# Patient Record
Sex: Female | Born: 1935 | Race: White | Hispanic: No | Marital: Married | Smoking: Former smoker
Health system: Southern US, Community
[De-identification: ages and names within clinical notes are randomized; demographics above are authoritative.]

## PROBLEM LIST (undated history)

## (undated) DIAGNOSIS — C50912 Malignant neoplasm of unspecified site of left female breast: Secondary | ICD-10-CM

## (undated) DIAGNOSIS — E785 Hyperlipidemia, unspecified: Secondary | ICD-10-CM

## (undated) DIAGNOSIS — N6019 Diffuse cystic mastopathy of unspecified breast: Secondary | ICD-10-CM

## (undated) DIAGNOSIS — K5900 Constipation, unspecified: Secondary | ICD-10-CM

## (undated) DIAGNOSIS — G43109 Migraine with aura, not intractable, without status migrainosus: Secondary | ICD-10-CM

## (undated) DIAGNOSIS — Z803 Family history of malignant neoplasm of breast: Secondary | ICD-10-CM

## (undated) HISTORY — PX: TONSILLECTOMY: SUR1361

## (undated) HISTORY — DX: Family history of malignant neoplasm of breast: Z80.3

## (undated) HISTORY — DX: Hyperlipidemia, unspecified: E78.5

## (undated) HISTORY — PX: BREAST EXCISIONAL BIOPSY: SUR124

## (undated) HISTORY — PX: BREAST BIOPSY: SHX20

## (undated) HISTORY — DX: Constipation, unspecified: K59.00

---

## 1999-10-26 HISTORY — PX: BREAST LUMPECTOMY: SHX2

## 2007-09-29 ENCOUNTER — Encounter: Admission: RE | Admit: 2007-09-29 | Discharge: 2007-09-29 | Payer: Self-pay | Admitting: Internal Medicine

## 2008-01-01 ENCOUNTER — Encounter: Admission: RE | Admit: 2008-01-01 | Discharge: 2008-01-01 | Payer: Self-pay | Admitting: Internal Medicine

## 2008-11-27 ENCOUNTER — Encounter: Admission: RE | Admit: 2008-11-27 | Discharge: 2008-11-27 | Payer: Self-pay | Admitting: Family Medicine

## 2009-03-14 ENCOUNTER — Encounter: Admission: RE | Admit: 2009-03-14 | Discharge: 2009-03-14 | Payer: Self-pay | Admitting: Family Medicine

## 2009-07-24 ENCOUNTER — Encounter: Admission: RE | Admit: 2009-07-24 | Discharge: 2009-07-24 | Payer: Self-pay | Admitting: Family Medicine

## 2009-11-24 ENCOUNTER — Other Ambulatory Visit: Admission: RE | Admit: 2009-11-24 | Discharge: 2009-11-24 | Payer: Self-pay | Admitting: *Deleted

## 2009-12-03 ENCOUNTER — Encounter: Admission: RE | Admit: 2009-12-03 | Discharge: 2009-12-03 | Payer: Self-pay | Admitting: *Deleted

## 2010-03-02 ENCOUNTER — Encounter: Admission: RE | Admit: 2010-03-02 | Discharge: 2010-04-15 | Payer: Self-pay | Admitting: *Deleted

## 2010-11-14 ENCOUNTER — Other Ambulatory Visit: Payer: Self-pay | Admitting: Internal Medicine

## 2010-11-14 DIAGNOSIS — Z780 Asymptomatic menopausal state: Secondary | ICD-10-CM

## 2010-11-14 DIAGNOSIS — Z1231 Encounter for screening mammogram for malignant neoplasm of breast: Secondary | ICD-10-CM

## 2010-11-14 DIAGNOSIS — M8580 Other specified disorders of bone density and structure, unspecified site: Secondary | ICD-10-CM

## 2010-11-15 ENCOUNTER — Encounter (HOSPITAL_BASED_OUTPATIENT_CLINIC_OR_DEPARTMENT_OTHER): Payer: Self-pay | Admitting: Internal Medicine

## 2010-11-16 ENCOUNTER — Encounter: Payer: Self-pay | Admitting: Family Medicine

## 2010-12-04 ENCOUNTER — Ambulatory Visit
Admission: RE | Admit: 2010-12-04 | Discharge: 2010-12-04 | Disposition: A | Discharge status: Home / Self Care | Payer: MEDICARE | Source: Ambulatory Visit | Attending: Internal Medicine | Admitting: Internal Medicine

## 2010-12-04 DIAGNOSIS — Z1231 Encounter for screening mammogram for malignant neoplasm of breast: Secondary | ICD-10-CM

## 2010-12-07 ENCOUNTER — Other Ambulatory Visit: Payer: Self-pay | Admitting: Internal Medicine

## 2010-12-07 DIAGNOSIS — R928 Other abnormal and inconclusive findings on diagnostic imaging of breast: Secondary | ICD-10-CM

## 2010-12-16 ENCOUNTER — Ambulatory Visit
Admission: RE | Admit: 2010-12-16 | Discharge: 2010-12-16 | Disposition: A | Discharge status: Home / Self Care | Payer: MEDICARE | Source: Ambulatory Visit | Attending: Internal Medicine | Admitting: Internal Medicine

## 2010-12-16 DIAGNOSIS — R928 Other abnormal and inconclusive findings on diagnostic imaging of breast: Secondary | ICD-10-CM

## 2010-12-18 ENCOUNTER — Other Ambulatory Visit: Payer: Self-pay | Admitting: Internal Medicine

## 2010-12-18 DIAGNOSIS — Z09 Encounter for follow-up examination after completed treatment for conditions other than malignant neoplasm: Secondary | ICD-10-CM

## 2011-03-17 ENCOUNTER — Other Ambulatory Visit: Payer: Self-pay

## 2011-03-18 ENCOUNTER — Other Ambulatory Visit: Payer: Self-pay

## 2011-03-18 ENCOUNTER — Ambulatory Visit
Admission: RE | Admit: 2011-03-18 | Discharge: 2011-03-18 | Disposition: A | Discharge status: Home / Self Care | Payer: Medicare Other | Source: Ambulatory Visit | Attending: Internal Medicine | Admitting: Internal Medicine

## 2011-03-18 DIAGNOSIS — M8580 Other specified disorders of bone density and structure, unspecified site: Secondary | ICD-10-CM

## 2011-03-18 DIAGNOSIS — Z780 Asymptomatic menopausal state: Secondary | ICD-10-CM

## 2011-04-15 ENCOUNTER — Other Ambulatory Visit: Payer: Self-pay | Admitting: Family Medicine

## 2011-04-15 DIAGNOSIS — N6310 Unspecified lump in the right breast, unspecified quadrant: Secondary | ICD-10-CM

## 2011-04-20 ENCOUNTER — Ambulatory Visit
Admission: RE | Admit: 2011-04-20 | Discharge: 2011-04-20 | Disposition: A | Discharge status: Home / Self Care | Payer: Medicare Other | Source: Ambulatory Visit | Attending: Internal Medicine | Admitting: Internal Medicine

## 2011-04-20 ENCOUNTER — Other Ambulatory Visit: Payer: Self-pay | Admitting: Internal Medicine

## 2011-04-20 ENCOUNTER — Ambulatory Visit
Admission: RE | Admit: 2011-04-20 | Discharge: 2011-04-20 | Disposition: A | Discharge status: Home / Self Care | Payer: Medicare Other | Source: Ambulatory Visit | Attending: Family Medicine | Admitting: Family Medicine

## 2011-04-20 DIAGNOSIS — Z09 Encounter for follow-up examination after completed treatment for conditions other than malignant neoplasm: Secondary | ICD-10-CM

## 2011-04-20 DIAGNOSIS — N6310 Unspecified lump in the right breast, unspecified quadrant: Secondary | ICD-10-CM

## 2011-06-17 ENCOUNTER — Ambulatory Visit
Admission: RE | Admit: 2011-06-17 | Discharge: 2011-06-17 | Disposition: A | Discharge status: Home / Self Care | Payer: Medicare Other | Source: Ambulatory Visit | Attending: Internal Medicine | Admitting: Internal Medicine

## 2011-06-17 ENCOUNTER — Other Ambulatory Visit: Payer: Self-pay | Admitting: Family Medicine

## 2011-06-17 DIAGNOSIS — Z09 Encounter for follow-up examination after completed treatment for conditions other than malignant neoplasm: Secondary | ICD-10-CM

## 2011-11-17 ENCOUNTER — Other Ambulatory Visit: Payer: Self-pay | Admitting: Family Medicine

## 2011-11-17 DIAGNOSIS — N6009 Solitary cyst of unspecified breast: Secondary | ICD-10-CM

## 2011-12-08 ENCOUNTER — Ambulatory Visit
Admission: RE | Admit: 2011-12-08 | Discharge: 2011-12-08 | Disposition: A | Discharge status: Home / Self Care | Payer: PRIVATE HEALTH INSURANCE | Source: Ambulatory Visit | Attending: Family Medicine | Admitting: Family Medicine

## 2011-12-08 DIAGNOSIS — N6009 Solitary cyst of unspecified breast: Secondary | ICD-10-CM

## 2012-04-21 ENCOUNTER — Other Ambulatory Visit: Payer: Self-pay | Admitting: Family Medicine

## 2012-04-21 DIAGNOSIS — Z853 Personal history of malignant neoplasm of breast: Secondary | ICD-10-CM

## 2012-04-21 DIAGNOSIS — R922 Inconclusive mammogram: Secondary | ICD-10-CM

## 2012-04-21 DIAGNOSIS — R928 Other abnormal and inconclusive findings on diagnostic imaging of breast: Secondary | ICD-10-CM

## 2012-05-03 ENCOUNTER — Ambulatory Visit
Admission: RE | Admit: 2012-05-03 | Discharge: 2012-05-03 | Disposition: A | Discharge status: Home / Self Care | Payer: PRIVATE HEALTH INSURANCE | Source: Ambulatory Visit | Attending: Family Medicine | Admitting: Family Medicine

## 2012-05-03 ENCOUNTER — Inpatient Hospital Stay: Admission: RE | Admit: 2012-05-03 | Payer: PRIVATE HEALTH INSURANCE | Source: Ambulatory Visit

## 2012-05-03 DIAGNOSIS — R928 Other abnormal and inconclusive findings on diagnostic imaging of breast: Secondary | ICD-10-CM

## 2012-05-05 ENCOUNTER — Other Ambulatory Visit: Payer: PRIVATE HEALTH INSURANCE

## 2012-11-06 ENCOUNTER — Other Ambulatory Visit: Payer: Self-pay | Admitting: Family Medicine

## 2012-11-06 DIAGNOSIS — Z9889 Other specified postprocedural states: Secondary | ICD-10-CM

## 2012-11-06 DIAGNOSIS — Z1231 Encounter for screening mammogram for malignant neoplasm of breast: Secondary | ICD-10-CM

## 2013-05-04 ENCOUNTER — Ambulatory Visit
Admission: RE | Admit: 2013-05-04 | Discharge: 2013-05-04 | Disposition: A | Discharge status: Home / Self Care | Payer: PRIVATE HEALTH INSURANCE | Source: Ambulatory Visit | Attending: Family Medicine | Admitting: Family Medicine

## 2013-05-04 DIAGNOSIS — Z9889 Other specified postprocedural states: Secondary | ICD-10-CM

## 2013-05-04 DIAGNOSIS — Z1231 Encounter for screening mammogram for malignant neoplasm of breast: Secondary | ICD-10-CM

## 2014-03-27 ENCOUNTER — Other Ambulatory Visit: Payer: Self-pay | Admitting: Family Medicine

## 2014-03-27 ENCOUNTER — Other Ambulatory Visit: Payer: Self-pay

## 2014-03-27 DIAGNOSIS — Z1231 Encounter for screening mammogram for malignant neoplasm of breast: Secondary | ICD-10-CM

## 2014-03-27 DIAGNOSIS — M899 Disorder of bone, unspecified: Secondary | ICD-10-CM

## 2014-03-27 DIAGNOSIS — M8580 Other specified disorders of bone density and structure, unspecified site: Secondary | ICD-10-CM

## 2014-03-27 DIAGNOSIS — M949 Disorder of cartilage, unspecified: Principal | ICD-10-CM

## 2014-05-06 ENCOUNTER — Ambulatory Visit
Admission: RE | Admit: 2014-05-06 | Discharge: 2014-05-06 | Disposition: A | Discharge status: Home / Self Care | Payer: Commercial Managed Care - HMO | Source: Ambulatory Visit | Attending: Family Medicine | Admitting: Family Medicine

## 2014-05-06 ENCOUNTER — Other Ambulatory Visit: Payer: PRIVATE HEALTH INSURANCE

## 2014-05-06 ENCOUNTER — Ambulatory Visit: Payer: PRIVATE HEALTH INSURANCE

## 2014-05-06 ENCOUNTER — Encounter (INDEPENDENT_AMBULATORY_CARE_PROVIDER_SITE_OTHER): Payer: Self-pay

## 2014-05-06 DIAGNOSIS — Z1231 Encounter for screening mammogram for malignant neoplasm of breast: Secondary | ICD-10-CM

## 2014-05-06 DIAGNOSIS — M8580 Other specified disorders of bone density and structure, unspecified site: Secondary | ICD-10-CM

## 2014-05-06 DIAGNOSIS — M949 Disorder of cartilage, unspecified: Principal | ICD-10-CM

## 2014-05-06 DIAGNOSIS — M899 Disorder of bone, unspecified: Secondary | ICD-10-CM

## 2014-05-08 ENCOUNTER — Other Ambulatory Visit: Payer: PRIVATE HEALTH INSURANCE

## 2014-05-08 ENCOUNTER — Ambulatory Visit: Payer: PRIVATE HEALTH INSURANCE

## 2015-02-25 ENCOUNTER — Other Ambulatory Visit: Payer: Self-pay | Admitting: Family Medicine

## 2015-02-25 DIAGNOSIS — N644 Mastodynia: Secondary | ICD-10-CM

## 2015-02-28 ENCOUNTER — Ambulatory Visit
Admission: RE | Admit: 2015-02-28 | Discharge: 2015-02-28 | Disposition: A | Discharge status: Home / Self Care | Payer: Commercial Managed Care - HMO | Source: Ambulatory Visit | Attending: Family Medicine | Admitting: Family Medicine

## 2015-02-28 DIAGNOSIS — N644 Mastodynia: Secondary | ICD-10-CM

## 2015-05-27 ENCOUNTER — Other Ambulatory Visit: Payer: Self-pay

## 2015-05-27 DIAGNOSIS — Z1231 Encounter for screening mammogram for malignant neoplasm of breast: Secondary | ICD-10-CM

## 2015-06-05 ENCOUNTER — Ambulatory Visit
Admission: RE | Admit: 2015-06-05 | Discharge: 2015-06-05 | Disposition: A | Discharge status: Home / Self Care | Payer: Medicare HMO | Source: Ambulatory Visit

## 2015-06-05 DIAGNOSIS — Z1231 Encounter for screening mammogram for malignant neoplasm of breast: Secondary | ICD-10-CM

## 2016-01-09 ENCOUNTER — Other Ambulatory Visit: Payer: Self-pay | Admitting: Family Medicine

## 2016-01-09 DIAGNOSIS — N6459 Other signs and symptoms in breast: Secondary | ICD-10-CM

## 2016-01-09 DIAGNOSIS — N644 Mastodynia: Secondary | ICD-10-CM

## 2016-01-13 ENCOUNTER — Ambulatory Visit
Admission: RE | Admit: 2016-01-13 | Discharge: 2016-01-13 | Disposition: A | Discharge status: Home / Self Care | Payer: Medicare HMO | Source: Ambulatory Visit | Attending: Family Medicine | Admitting: Family Medicine

## 2016-01-13 DIAGNOSIS — N644 Mastodynia: Secondary | ICD-10-CM

## 2016-01-13 DIAGNOSIS — N6459 Other signs and symptoms in breast: Secondary | ICD-10-CM

## 2016-05-07 ENCOUNTER — Other Ambulatory Visit: Payer: Self-pay | Admitting: Family Medicine

## 2016-05-07 DIAGNOSIS — Z1231 Encounter for screening mammogram for malignant neoplasm of breast: Secondary | ICD-10-CM

## 2016-06-15 ENCOUNTER — Ambulatory Visit
Admission: RE | Admit: 2016-06-15 | Discharge: 2016-06-15 | Disposition: A | Discharge status: Home / Self Care | Payer: Medicare HMO | Source: Ambulatory Visit | Attending: Family Medicine | Admitting: Family Medicine

## 2016-06-15 DIAGNOSIS — Z1231 Encounter for screening mammogram for malignant neoplasm of breast: Secondary | ICD-10-CM

## 2017-05-27 ENCOUNTER — Other Ambulatory Visit: Payer: Self-pay | Admitting: Family Medicine

## 2017-05-27 DIAGNOSIS — Z1231 Encounter for screening mammogram for malignant neoplasm of breast: Secondary | ICD-10-CM

## 2017-06-16 ENCOUNTER — Ambulatory Visit: Payer: Medicare HMO

## 2017-06-16 ENCOUNTER — Ambulatory Visit
Admission: RE | Admit: 2017-06-16 | Discharge: 2017-06-16 | Disposition: A | Discharge status: Home / Self Care | Payer: Medicare HMO | Source: Ambulatory Visit | Attending: Family Medicine | Admitting: Family Medicine

## 2017-06-16 DIAGNOSIS — Z1231 Encounter for screening mammogram for malignant neoplasm of breast: Secondary | ICD-10-CM

## 2017-06-20 ENCOUNTER — Other Ambulatory Visit: Payer: Self-pay | Admitting: Family Medicine

## 2017-06-20 DIAGNOSIS — R928 Other abnormal and inconclusive findings on diagnostic imaging of breast: Secondary | ICD-10-CM

## 2017-06-21 ENCOUNTER — Other Ambulatory Visit: Payer: Self-pay | Admitting: Nurse Practitioner

## 2017-06-21 DIAGNOSIS — R928 Other abnormal and inconclusive findings on diagnostic imaging of breast: Secondary | ICD-10-CM

## 2017-06-23 ENCOUNTER — Ambulatory Visit
Admission: RE | Admit: 2017-06-23 | Discharge: 2017-06-23 | Disposition: A | Discharge status: Home / Self Care | Payer: Medicare HMO | Source: Ambulatory Visit | Attending: Family Medicine | Admitting: Family Medicine

## 2017-06-23 ENCOUNTER — Other Ambulatory Visit: Payer: Self-pay | Admitting: Nurse Practitioner

## 2017-06-23 DIAGNOSIS — R928 Other abnormal and inconclusive findings on diagnostic imaging of breast: Secondary | ICD-10-CM

## 2017-06-23 DIAGNOSIS — N6320 Unspecified lump in the left breast, unspecified quadrant: Secondary | ICD-10-CM

## 2017-06-24 ENCOUNTER — Other Ambulatory Visit: Payer: Medicare HMO

## 2017-06-28 ENCOUNTER — Ambulatory Visit
Admission: RE | Admit: 2017-06-28 | Discharge: 2017-06-28 | Disposition: A | Discharge status: Home / Self Care | Payer: Medicare HMO | Source: Ambulatory Visit | Attending: Nurse Practitioner | Admitting: Nurse Practitioner

## 2017-06-28 ENCOUNTER — Other Ambulatory Visit: Payer: Self-pay | Admitting: Nurse Practitioner

## 2017-06-28 DIAGNOSIS — N6320 Unspecified lump in the left breast, unspecified quadrant: Secondary | ICD-10-CM

## 2017-06-30 ENCOUNTER — Telehealth: Payer: Self-pay | Admitting: Hematology and Oncology

## 2017-06-30 ENCOUNTER — Encounter: Payer: Self-pay | Admitting: *Deleted

## 2017-06-30 DIAGNOSIS — C50412 Malignant neoplasm of upper-outer quadrant of left female breast: Secondary | ICD-10-CM

## 2017-06-30 DIAGNOSIS — Z170 Estrogen receptor positive status [ER+]: Principal | ICD-10-CM

## 2017-06-30 NOTE — Telephone Encounter (Signed)
LVM for patient to call me back in ref to upcoming appointment

## 2017-06-30 NOTE — Telephone Encounter (Signed)
Patient confirmed appointment for Breast Clinic 07/06/17, intake form was faxed to patient @ [email or web address], 06/30/17

## 2017-07-04 NOTE — Progress Notes (Signed)
[facility]  [phone] Fax:([phone]) [place] Note   Patient Care Team: Barrie Lyme, FNP as PCP - General (Nurse Practitioner) [name], MD as Consulting Physician (General Surgery) [name], MD as Consulting Physician (Hematology) [name], MD as Consulting Physician (Radiation Oncology) 07/06/2017  CHIEF COMPLAINTS/PURPOSE OF CONSULTATION:  Malignant neoplasm of upper-outer quadrant of left breast in female, estrogen receptor positive     Malignant neoplasm of upper-outer quadrant of left breast in female, estrogen receptor positive ([place])   06/23/2017 Mammogram    Diagnostic mammogram 06/23/17 IMPRESSION: Indeterminate mass with irregular margins at the 2:30 position 4 cm from nipple measuring 1.4 x 0.8 x 1.2 cm in the upper-outer left breast.      06/28/2017 Initial Biopsy    Diagnosis 06/28/17 Breast, left, needle core biopsy, upper outer quadrant, 2:30 o'clock, 4cm from nipple - INVASIVE MAMMARY CARCINOMA WITH CALCIFICATIONS, SEE COMMENT.      06/28/2017 Receptors her2    Estrogen Receptor: 100%, POSITIVE, STRONG STAINING INTENSITY Progesterone Receptor: 20%, POSITIVE, STRONG STAINING INTENSITY Proliferation Marker Ki67: 25% HER2: Postive       06/30/2017 Initial Diagnosis    Malignant neoplasm of upper-outer quadrant of left breast in female, estrogen receptor positive ([place])       HISTORY OF PRESENTING ILLNESS: 07/06/17 Kathleen Hanson 81 y.o. female is here because of newly diagnosed Malignant neoplasm of upper-outer quadrant of left breast in female, estrogen receptor positive. She presents to Breast Clinic alone today.   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 Her sister had breast cancer when she was 8. Her father had bladder cancer due to smoking. She denies any other significant medical history. She smoked from [age] yo, less than a  pack.   Today she reports to feeling her lump after her mammogram and she does not have any pain.  She denies change in weight, appetite or pain. She has been active with weight watchers and has purposefully lost weight.  She did not ask husband to attend but he would if she needed him to. She has two children, daughter and adopted son. She is retired, but previously a Pharmacist, hospital. 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 She wears hearing aids.    GYN HISTORY  Menarchal: [age] LMP: early 41s in 1992 Contraceptive: No HRT: No GP: G2P1A1, prior miscarriage, Has daughter and adopted son   MEDICAL HISTORY:  Past Medical History:  Diagnosis Date  . Breast cancer ([place])   . Constipation   . Hyperlipidemia     SURGICAL HISTORY: Past Surgical History:  Procedure Laterality Date  . BREAST EXCISIONAL BIOPSY Right   . BREAST LUMPECTOMY Left    chemo and radiation  . CESAREAN SECTION    . TONSILLECTOMY      SOCIAL HISTORY: Social History   Social History  . Marital status: Married    Spouse name: N/A  . Number of children: N/A  . Years of education: N/A   Occupational History  . Not on file.   Social History Main Topics  . Smoking status: Former Smoker    Packs/day: 0.50    Years: 15.00  . Smokeless tobacco: Never Used  . Alcohol use 0.6 oz/week    1 Glasses of wine per week     Comment: daily   . Drug use: No  . Sexual activity: Not  on file   Other Topics Concern  . Not on file   Social History Narrative  . No narrative on file    FAMILY HISTORY: Family History  Problem Relation Age of Onset  . Cancer Father        bladder cancer  . Breast cancer Sister 20    ALLERGIES:  has No Known Allergies.  MEDICATIONS:  Current Outpatient Prescriptions  Medication Sig Dispense Refill  . Ascorbic Acid (VITAMIN C) 1000 MG tablet Take by mouth.    . Multiple Vitamin (MULTIVITAMIN) capsule Take 1 capsule by mouth.    . polyethylene glycol  powder (GLYCOLAX/MIRALAX) powder MIX 17 GRAMS IN LIQUID AND DRINK DAILY    . simvastatin (ZOCOR) 20 MG tablet Take 20 mg by mouth.    Marland Kitchen aspirin 81 MG chewable tablet Chew 81 mg by mouth.     No current facility-administered medications for this visit.     REVIEW OF SYSTEMS:   Constitutional: Denies fevers, chills or abnormal night sweats Eyes: Denies blurriness of vision, double vision or watery eyes Ears, nose, mouth, throat, and face: Denies mucositis or sore throat (+) wears hearing aids Respiratory: Denies cough, dyspnea or wheezes Cardiovascular: Denies palpitation, chest discomfort or lower extremity swelling Gastrointestinal:  Denies nausea, heartburn or change in bowel habits Skin: Denies abnormal skin rashes Lymphatics: Denies new lymphadenopathy or easy bruising Neurological:Denies numbness, tingling or new weaknesses BREAST: (+) Palpable lump in left breast  Behavioral/Psych: Mood is stable, no new changes  All other systems were reviewed with the patient and are negative.  PHYSICAL EXAMINATION: ECOG PERFORMANCE STATUS: 0 - Asymptomatic  Vitals:   07/06/17 1307  BP: (!) 153/57  Pulse: 62  Resp: 18  Temp: 98.2 F (36.8 C)  SpO2: 99%   Filed Weights   07/06/17 1307  Weight: 133 lb 3.2 oz (60.4 kg)    GENERAL:alert, no distress and comfortable SKIN: skin color, texture, turgor are normal, no rashes or significant lesions EYES: normal, conjunctiva are pink and non-injected, sclera clear OROPHARYNX:no exudate, no erythema and lips, buccal mucosa, and tongue normal  NECK: supple, thyroid normal size, non-tender, without nodularity LYMPH:  no palpable lymphadenopathy in the cervical, axillary or inguinal LUNGS: clear to auscultation and percussion with normal breathing effort HEART: regular rate & rhythm and no murmurs and no lower extremity edema ABDOMEN:abdomen soft, non-tender and normal bowel sounds Musculoskeletal:no cyanosis of digits and no clubbing  PSYCH:  alert & oriented x 3 with fluent speech NEURO: no focal motor/sensory deficits BREAST: (+) 2.5 x 2.5 cm lump in 2:30 position of left breast. no tenderness with extensive skin ecchymosis, No other palpable breast mass or adenopathy.    LABORATORY DATA:  I have reviewed the data as listed CBC Latest Ref Rng & Units 07/06/2017  WBC 3.9 - 10.3 10e3/uL 4.8  Hemoglobin 11.6 - 15.9 g/dL 14.2  Hematocrit 34.8 - 46.6 % 42.3  Platelets 145 - 400 10e3/uL 201    CMP Latest Ref Rng & Units 07/06/2017  Glucose 70 - 140 mg/dl 89  BUN 7.0 - 26.0 mg/dL 10.5  Creatinine 0.6 - 1.1 mg/dL 0.6  Sodium 136 - 145 mEq/L 137  Potassium 3.5 - 5.1 mEq/L 4.3  CO2 22 - 29 mEq/L 26  Calcium 8.4 - 10.4 mg/dL 9.4  Total Protein 6.4 - 8.3 g/dL 7.1  Total Bilirubin 0.20 - 1.20 mg/dL 0.52  Alkaline Phos 40 - 150 U/L 88  AST 5 - 34 U/L 18  ALT 0 -  55 U/L 16     PATHOLOGY  Diagnosis 06/28/17 Breast, left, needle core biopsy, upper outer quadrant, 2:30 o'clock, 4cm from nipple - INVASIVE MAMMARY CARCINOMA WITH CALCIFICATIONS, SEE COMMENT. Microscopic Comment The carcinoma appears grade 3. E-cadherin will be ordered and reported in an addendum. Prognostic markers will be ordered. Dr. Melina Copa has reviewed the case. The case was called to [facility] on 06/29/2017. FLUORESCENCE IN-SITU HYBRIDIZATION Results: HER2 - *EQUIVOCAL* (Her2 by IHC ordered) RATIO OF HER2/CEP17 SIGNALS 1.49 AVERAGE HER2 COPY NUMBER PER CELL 4.20 Reference Range: NEGATIVE HER2/CEP17 Ratio <2.0 and average HER2 copy number <4.0 EQUIVOCAL HER2/CEP17 Ratio <2.0 and average HER2 copy number >=4.0 and <6.0 POSITIVE HER2/CEP17 Ratio >=2.0 or <2.0 and average HER2 copy number >=6.0 PROGNOSTIC INDICATORS Results: IMMUNOHISTOCHEMICAL AND MORPHOMETRIC ANALYSIS PERFORMED MANUALLY Estrogen Receptor: 100%, POSITIVE, STRONG STAINING INTENSITY Progesterone Receptor: 20%, POSITIVE, STRONG STAINING INTENSITY Proliferation Marker Ki67:  25% 1 of 3 FINAL for LYANA, ASBILL [phone]) ADDITIONAL INFORMATION:(continued) REFERENCE RANGE ESTROGEN RECEPTOR NEGATIVE 0% POSITIVE =>1% REFERENCE RANGE PROGESTERONE RECEPTOR NEGATIVE 0% POSITIVE =>1% All controls stained appropriately    RADIOGRAPHIC STUDIES: I have personally reviewed the radiological images as listed and agreed with the findings in the report. US Breast Ltd Uni Left Inc Axilla  Result Date: 06/23/2017 CLINICAL DATA:  Screening recall for possible left breast distortion and calcifications. Patient has history of prior left breast cancer post lumpectomy 2001. EXAM: 2D DIGITAL DIAGNOSTIC UNILATERAL LEFT MAMMOGRAM WITH CAD AND ADJUNCT TOMO LEFT BREAST ULTRASOUND COMPARISON:  Previous exam(s). ACR Breast Density Category c: The breast tissue is heterogeneously dense, which may obscure small masses. FINDINGS: Spot compression CC and MLO tomograms were performed over the upper-outer left breast demonstrating a persistent asymmetry/possible mass. Postsurgical changes with associated distortion are seen in the upper-outer left breast related to prior lumpectomy. Spot compression magnification views of the upper-outer left breast demonstrate scattered round and punctate calcifications as well as dystrophic calcifications with a probable suture calcification, all benign in appearance. Mammographic images were processed with CAD. Physical examination of the left breast reveals 2 horizontally oriented scars above the level of the nipple. No definite underlying palpable masses. Targeted ultrasound of the left breast was performed demonstrating a hypoechoic mass with irregular margins at the 2:30 position 4 cm from nipple measuring 1.4 x 0.8 x 1.2 cm. Calcifications are seen within this mass. This could represent an area of fibrocystic change, however cannot be clearly characterized as such. No lymphadenopathy seen in the left axilla. IMPRESSION: Indeterminate mass in the  upper-outer left breast. RECOMMENDATION: Ultrasound-guided biopsy of the mass in the upper-outer left breast is recommended. This is being scheduled for the patient. I have discussed the findings and recommendations with the patient. Results were also provided in writing at the conclusion of the visit. If applicable, a reminder letter will be sent to the patient regarding the next appointment. BI-RADS CATEGORY  4: Suspicious. Electronically Signed   By: Everlean Alstrom M.D.   On: 06/23/2017 16:28   Mm Diag Breast Tomo Uni Left  Result Date: 06/23/2017 CLINICAL DATA:  Screening recall for possible left breast distortion and calcifications. Patient has history of prior left breast cancer post lumpectomy 2001. EXAM: 2D DIGITAL DIAGNOSTIC UNILATERAL LEFT MAMMOGRAM WITH CAD AND ADJUNCT TOMO LEFT BREAST ULTRASOUND COMPARISON:  Previous exam(s). ACR Breast Density Category c: The breast tissue is heterogeneously dense, which may obscure small masses. FINDINGS: Spot compression CC and MLO tomograms were performed over the upper-outer left breast demonstrating  a persistent asymmetry/possible mass. Postsurgical changes with associated distortion are seen in the upper-outer left breast related to prior lumpectomy. Spot compression magnification views of the upper-outer left breast demonstrate scattered round and punctate calcifications as well as dystrophic calcifications with a probable suture calcification, all benign in appearance. Mammographic images were processed with CAD. Physical examination of the left breast reveals 2 horizontally oriented scars above the level of the nipple. No definite underlying palpable masses. Targeted ultrasound of the left breast was performed demonstrating a hypoechoic mass with irregular margins at the 2:30 position 4 cm from nipple measuring 1.4 x 0.8 x 1.2 cm. Calcifications are seen within this mass. This could represent an area of fibrocystic change, however cannot be clearly  characterized as such. No lymphadenopathy seen in the left axilla. IMPRESSION: Indeterminate mass in the upper-outer left breast. RECOMMENDATION: Ultrasound-guided biopsy of the mass in the upper-outer left breast is recommended. This is being scheduled for the patient. I have discussed the findings and recommendations with the patient. Results were also provided in writing at the conclusion of the visit. If applicable, a reminder letter will be sent to the patient regarding the next appointment. BI-RADS CATEGORY  4: Suspicious. Electronically Signed   By: Everlean Alstrom M.D.   On: 06/23/2017 16:28   Mm Screening Breast Tomo Bilateral  Result Date: 06/17/2017 CLINICAL DATA:  Screening. EXAM: 2D DIGITAL SCREENING BILATERAL MAMMOGRAM WITH CAD AND ADJUNCT TOMO COMPARISON:  Previous exam(s). ACR Breast Density Category c: The breast tissue is heterogeneously dense, which may obscure small masses. FINDINGS: In the left breast, possible distortion and calcifications warrant further evaluation. In the right breast, no findings suspicious for malignancy. Images were processed with CAD. IMPRESSION: Further evaluation is suggested for possible distortion and calcifications in the left breast. RECOMMENDATION: Diagnostic mammogram and possibly ultrasound of the left breast. (Code:FI-L-30M) The patient will be contacted regarding the findings, and additional imaging will be scheduled. BI-RADS CATEGORY  0: Incomplete. Need additional imaging evaluation and/or prior mammograms for comparison. Electronically Signed   By: Ammie Ferrier M.D.   On: 06/17/2017 12:21   Mm Clip Placement Left  Result Date: 06/28/2017 CLINICAL DATA:  Confirmation clip placement after ultrasound-guided core needle biopsy of an indeterminate mass in the upper outer quadrant of the left breast. EXAM: DIAGNOSTIC LEFT MAMMOGRAM POST ULTRASOUND BIOPSY COMPARISON:  Previous exam(s). FINDINGS: Mammographic images were obtained following ultrasound  guided biopsy of biopsy of an indeterminate mass in the upper outer quadrant of the left breast. The coil shaped tissue marker clip is appropriately positioned in the upper outer quadrant at the site of the biopsied mass. Expected post biopsy changes are present without evidence of hematoma. IMPRESSION: Appropriate positioning of the ribbon shaped tissue marker clip at the site of the biopsied mass in the upper outer quadrant of the left breast. Final Assessment: Post Procedure Mammograms for Marker Placement Electronically Signed   By: Evangeline Dakin M.D.   On: 06/28/2017 15:35   [place] Lt Breast Bx W Loc Dev 1st Lesion Img Bx Spec US Guide  Addendum Date: 06/30/2017   ADDENDUM REPORT: 06/29/2017 14:54 ADDENDUM: Pathology revealed GRADE III INVASIVE MAMMARY CARCINOMA WITH CALCIFICATIONS of the Left breast, 2:30 o'clock, 4 cm from nipple. This was found to be concordant by Dr. Peggye Fothergill. Pathology results were discussed with the patient by telephone. The patient reported doing well after the biopsy with tenderness and minimal bleeding at the site. Post biopsy instructions and care were reviewed and questions were answered.  The patient was encouraged to call [facility] for any additional concerns. The patient was referred to [facility] at [facility] on July 06, 2017. Pathology results reported by Terie Purser, RN on 06/29/2017. Electronically Signed   By: Evangeline Dakin M.D.   On: 06/29/2017 14:54   Result Date: 06/30/2017 CLINICAL DATA:  81 year old with personal history of malignant lumpectomy of the upper outer quadrant of the left breast in 2001, presenting with a screening detected 1.2 cm indeterminate mass in the upper outer quadrant of the left breast at the 2:30 o'clock position approximately 4 cm from the nipple. EXAM: ULTRASOUND GUIDED LEFT BREAST CORE NEEDLE BIOPSY COMPARISON:  Previous exam(s).  FINDINGS: I met with the patient and we discussed the procedure of ultrasound-guided biopsy, including benefits and alternatives. We discussed the high likelihood of a successful procedure. We discussed the risks of the procedure, including infection, bleeding, tissue injury, clip migration, and inadequate sampling. Informed written consent was given. The usual time-out protocol was performed immediately prior to the procedure. Lesion quadrant: Upper outer quadrant. Using sterile technique with chlorhexidine as skin antisepsis, 1% Lidocaine as local anesthetic, under direct ultrasound visualization, a 12 gauge Bard Marquee core needle device was used to perform biopsy of the indeterminate mass in the upper outer quadrant of the left breast at the 2:30 o'clock position approximately 4 cm from using a lateral approach. At the conclusion of the procedure a ribbon tissue marker clip was deployed into the biopsy cavity. Follow up 2 view mammogram was performed and dictated separately. IMPRESSION: Ultrasound guided biopsy of an indeterminate mass in the upper outer quadrant of the left breast. No apparent complications. Electronically Signed: By: Evangeline Dakin M.D. On: 06/28/2017 15:33    ASSESSMENT & PLAN:  Kathleen Hanson is a 81 y.o. caucasian female with a history of breast cancer, HLD, presented with screening discovered left breast cancer.  1. Malignant neoplasm of upper-outer quadrant of left breast in female, invasive ductal carcinoma, c([id number]), Stage: 1A, triple positive, Grade 3 -We reviewed her mammogram and biopsy results.  -Due to her previous breast cancer in the same breast, history of radiation, we recommend left breast mastectomy. She was seen by surgeon Dr. Marlou Starks today, patient agrees with her mastectomy. Dr. Marlou Starks will try sentinel lymph node mapping. She previously had 2-3 lymph nodes removed during her breast surgery in 2001. -We discussed her invasive ductal tumor is early stage, but  the body is more aggressive, due to ER/PR/HER2 positive, grade 3. She has moderate to high risk of recurrence after surgical resection. -I recommend adjuvant chemotherapy with anti-HER-2 therapy. If her surgical pathology review tumor less than 2 cm, I'll recommend weekly Taxol and Herceptin for 12 weeks, followed by additional Herceptin every 3 weeks for 3 months was -If her final surgical pathology reviewed more advanced disease, such as tumor more than 2 cm, or positive lymph nodes, I will recommend more intensive chemotherapy, likely TCHP. -Since she is ER/PR positive she will do Anti-estrogen therapy, letrozole for 5-7 years after chemo.  -She will get a port placed during surgery -She was seen by radiation oncologist Dr. Sondra Come today, due to her previous breast radiation, she probably will not be a candidate for re-radiation, so mastectomy was recommended. -F/u with pt after surgery   2. History of ductal carcinoma in situ (DCIS) of breast, left 2001 10/26/1999, Estrogen receptor negative, stage 1, Grade 3 -Treated in [place]  -  s/p Lumpectomy (sentinel node negative, 2-3 removed), chemo, radiation   3. Bone Health -Osteopenia 03/27/2014, DEXA 2012 T=-1.9, 2015 T = -1.4;  -I encouraged her to take calcium and vitamin D supplement   PLAN: -She'll proceed with left mastectomy with Dr. Marlou Starks soon, she will have a port placed during her surgery -f/u 2 weeks after surgery, to finalize her adjuvant chemotherapy -We'll arrange echocardiogram, chemotherapy class, after her surgery.    No orders of the defined types were placed in this encounter.   All questions were answered. The patient knows to call the clinic with any problems, questions or concerns. I spent 55 minutes counseling the patient face to face. The total time spent in the appointment was 60 minutes and more than 50% was on counseling.   This document serves as a record of services personally performed by [name], MD.  It was created on her behalf by Joslyn Devon, a trained medical scribe. The creation of this record is based on the scribe's personal observations and the provider's statements to them. This document has been checked and approved by the attending provider.      [name], MD 07/06/2017 6:08 PM

## 2017-07-06 ENCOUNTER — Ambulatory Visit: Payer: Medicare HMO | Attending: General Surgery | Admitting: Physical Therapy

## 2017-07-06 ENCOUNTER — Encounter: Payer: Self-pay | Admitting: Hematology

## 2017-07-06 ENCOUNTER — Other Ambulatory Visit (HOSPITAL_BASED_OUTPATIENT_CLINIC_OR_DEPARTMENT_OTHER): Payer: Medicare HMO

## 2017-07-06 ENCOUNTER — Ambulatory Visit
Admission: RE | Admit: 2017-07-06 | Discharge: 2017-07-06 | Disposition: A | Discharge status: Home / Self Care | Payer: Medicare HMO | Source: Ambulatory Visit | Attending: Radiation Oncology | Admitting: Radiation Oncology

## 2017-07-06 ENCOUNTER — Other Ambulatory Visit: Payer: Self-pay | Admitting: *Deleted

## 2017-07-06 ENCOUNTER — Encounter: Payer: Self-pay | Admitting: General Practice

## 2017-07-06 ENCOUNTER — Ambulatory Visit (HOSPITAL_BASED_OUTPATIENT_CLINIC_OR_DEPARTMENT_OTHER): Payer: Medicare HMO | Admitting: Hematology

## 2017-07-06 ENCOUNTER — Encounter: Payer: Self-pay | Admitting: Physical Therapy

## 2017-07-06 VITALS — BP 153/57 | HR 62 | Temp 98.2°F | Resp 18 | Ht 64.0 in | Wt 133.2 lb

## 2017-07-06 DIAGNOSIS — Z9189 Other specified personal risk factors, not elsewhere classified: Secondary | ICD-10-CM | POA: Diagnosis present

## 2017-07-06 DIAGNOSIS — Z86000 Personal history of in-situ neoplasm of breast: Secondary | ICD-10-CM | POA: Diagnosis not present

## 2017-07-06 DIAGNOSIS — Z803 Family history of malignant neoplasm of breast: Secondary | ICD-10-CM

## 2017-07-06 DIAGNOSIS — C50412 Malignant neoplasm of upper-outer quadrant of left female breast: Secondary | ICD-10-CM

## 2017-07-06 DIAGNOSIS — Z87891 Personal history of nicotine dependence: Secondary | ICD-10-CM | POA: Diagnosis not present

## 2017-07-06 DIAGNOSIS — Z170 Estrogen receptor positive status [ER+]: Principal | ICD-10-CM

## 2017-07-06 DIAGNOSIS — Z8052 Family history of malignant neoplasm of bladder: Secondary | ICD-10-CM

## 2017-07-06 DIAGNOSIS — R293 Abnormal posture: Secondary | ICD-10-CM | POA: Insufficient documentation

## 2017-07-06 LAB — COMPREHENSIVE METABOLIC PANEL
ALT: 16 U/L (ref 0–55)
AST: 18 U/L (ref 5–34)
Albumin: 4 g/dL (ref 3.5–5.0)
Alkaline Phosphatase: 88 U/L (ref 40–150)
Anion Gap: 10 mEq/L (ref 3–11)
BUN: 10.5 mg/dL (ref 7.0–26.0)
CALCIUM: 9.4 mg/dL (ref 8.4–10.4)
CHLORIDE: 102 meq/L (ref 98–109)
CO2: 26 meq/L (ref 22–29)
Creatinine: 0.6 mg/dL (ref 0.6–1.1)
EGFR: 85 mL/min/{1.73_m2} — ABNORMAL LOW (ref >90)
Glucose: 89 mg/dl (ref 70–140)
Potassium: 4.3 mEq/L (ref 3.5–5.1)
SODIUM: 137 meq/L (ref 136–145)
Total Bilirubin: 0.52 mg/dL (ref 0.20–1.20)
Total Protein: 7.1 g/dL (ref 6.4–8.3)

## 2017-07-06 LAB — CBC WITH DIFFERENTIAL/PLATELET
BASO%: 0.2 % (ref 0.0–2.0)
BASOS ABS: 0 10*3/uL (ref 0.0–0.1)
EOS ABS: 0.1 10*3/uL (ref 0.0–0.5)
EOS%: 1.5 % (ref 0.0–7.0)
HCT: 42.3 % (ref 34.8–46.6)
HGB: 14.2 g/dL (ref 11.6–15.9)
LYMPH%: 22.8 % (ref 14.0–49.7)
MCH: 31.2 pg (ref 25.1–34.0)
MCHC: 33.6 g/dL (ref 31.5–36.0)
MCV: 93 fL (ref 79.5–101.0)
MONO#: 0.3 10*3/uL (ref 0.1–0.9)
MONO%: 7.1 % (ref 0.0–14.0)
NEUT#: 3.3 10*3/uL (ref 1.5–6.5)
NEUT%: 68.4 % (ref 38.4–76.8)
Platelets: 201 10*3/uL (ref 145–400)
RBC: 4.55 10*6/uL (ref 3.70–5.45)
RDW: 13 % (ref 11.2–14.5)
WBC: 4.8 10*3/uL (ref 3.9–10.3)
lymph#: 1.1 10*3/uL (ref 0.9–3.3)

## 2017-07-06 NOTE — Therapy (Signed)
[place], [place], [address] Phone: [phone]   Fax:  [phone]  Physical Therapy Evaluation  Patient Details  Name: Kathleen Hanson MRN: [id number] Date of Birth: 04-15-1936 Referring Provider: Dr. Autumn Messing  Encounter Date: 07/06/2017      PT End of Session - 07/06/17 1433    Visit Number 1   Number of Visits 1   PT Start Time 1320   PT Stop Time 1791  Also saw pt from 1411-1418 for a total of 25 minutes   PT Time Calculation (min) 18 min   Activity Tolerance Patient tolerated treatment well   Behavior During Therapy [facility] for tasks assessed/performed      Past Medical History:  Diagnosis Date  . Breast cancer ([place])   . Constipation   . Hyperlipidemia     Past Surgical History:  Procedure Laterality Date  . BREAST EXCISIONAL BIOPSY Right   . BREAST LUMPECTOMY Left    chemo and radiation  . CESAREAN SECTION    . TONSILLECTOMY      There were no vitals filed for this visit.       Subjective Assessment - 07/06/17 1422    Subjective Patient reports she is here to be seen by her medical team for her newly diagnosed left breast cancer.   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 It measures 1.4 cm and is located in the upper outer quadrant with a Ki67 of 25%. She has a history of left breast cancer from 2001 and underwent a lumpectomy and sentinel node biopsy, chemotherapy and radiation.   Patient Stated Goals Reduce lymphedema risk and learn post op shoulder ROM HEP   Currently in Pain? No/denies            [facility] PT Assessment - 07/06/17 0001      Assessment   Medical Diagnosis Left breast cancer   Referring Provider Dr. Autumn Messing   Onset Date/Surgical Date 06/16/17   Hand Dominance Right   Prior Therapy none     Precautions   Precautions Other (comment)   Precaution Comments Active  left breast cancer and hx left breast cancer with lymphedema risk     Restrictions   Weight Bearing Restrictions No     Balance Screen   Has the patient fallen in the past 6 months Yes   How many times? 1  Misstep on stairs;sprained ankle;reports not a balance issue   Has the patient had a decrease in activity level because of a fear of falling?  No   Is the patient reluctant to leave their home because of a fear of falling?  No     Home Ecologist residence   Living Arrangements Spouse/significant other   Available Help at Discharge Family     Prior Function   Level of [place] Retired   Leisure Walks 1 hour/day     Cognition   Overall Cognitive Status Within Functional Limits for tasks assessed     ROM / Strength   AROM / PROM / Strength AROM;Strength     AROM   AROM Assessment Site Shoulder;Cervical   Right/Left Shoulder Right;Left   Right Shoulder Extension 53 Degrees   Right Shoulder Flexion 136 Degrees   Right Shoulder ABduction 152 Degrees   Right Shoulder Internal Rotation 75 Degrees   Right Shoulder External Rotation 78 Degrees  Left Shoulder Extension 50 Degrees   Left Shoulder Flexion 143 Degrees   Left Shoulder ABduction 147 Degrees   Left Shoulder Internal Rotation 85 Degrees   Left Shoulder External Rotation 87 Degrees   Cervical Flexion WNL   Cervical Extension WNL   Cervical - Right Side Bend WNL   Cervical - Left Side Bend WNL   Cervical - Right Rotation WNL   Cervical - Left Rotation WNL     Strength   Overall Strength Within functional limits for tasks performed           LYMPHEDEMA/ONCOLOGY QUESTIONNAIRE - 07/06/17 1430      Type   Cancer Type Left breast cancer     Lymphedema Assessments   Lymphedema Assessments Upper extremities     Right Upper Extremity Lymphedema   10 cm Proximal to Olecranon Process 24.7 cm   Olecranon Process 23.2 cm   10 cm Proximal to Ulnar Styloid  Process 17.8 cm   Just Proximal to Ulnar Styloid Process 14.2 cm   Across Hand at [employer] 17 cm   At [place] of 2nd Digit 6 cm     Left Upper Extremity Lymphedema   10 cm Proximal to Olecranon Process 24.7 cm   Olecranon Process 22.8 cm   10 cm Proximal to Ulnar Styloid Process 17.2 cm   Just Proximal to Ulnar Styloid Process 13.8 cm   Across Hand at [employer] 16.9 cm   At [place] of 2nd Digit 5.9 cm         Objective measurements completed on examination: See above findings.    Patient was instructed today in a home exercise program today for post op shoulder range of motion. These included active assist shoulder flexion in sitting, scapular retraction, wall walking with shoulder abduction, and hands behind head external rotation.  She was encouraged to do these twice a day, holding 3 seconds and repeating 5 times when permitted by her physician.         PT Education - 07/06/17 1432    Education provided Yes   Education Details Lymphedema risk reduction and post op shoulder ROM HEP   Person(s) Educated Patient   Methods Explanation;Demonstration;Handout   Comprehension Returned demonstration;Verbalized understanding              Breast Clinic Goals - 07/06/17 1538      Patient will be able to verbalize understanding of pertinent lymphedema risk reduction practices relevant to her diagnosis specifically related to skin care.   Time 1   Period Days   Status Achieved     Patient will be able to return demonstrate and/or verbalize understanding of the post-op home exercise program related to regaining shoulder range of motion.   Time 1   Period Days   Status Achieved     Patient will be able to verbalize understanding of the importance of attending the postoperative After Breast Cancer Class for further lymphedema risk reduction education and therapeutic exercise.   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 It measures 1.4 cm and is located in the upper outer quadrant with a Ki67 of 25%. She has a history of left breast cancer from 2001 and underwent a lumpectomy and sentinel node  biopsy, chemotherapy and radiation. Her multidisciplinary medical team met prior to her assessments to determine a recommended treatment plan. She is planning to have a left mastectomy and sentinel node biopsy followed by chemotherapy and anti-estrogen therapy. She will benefit from post op PT to regain shoulder ROM and reduce lymphedema risk.   History and Personal Factors relevant to plan of care: Previous left breast cancer with chemo, radiation, and sentinel node biopsy; left arm lymphedema risk   Clinical Presentation Evolving   Clinical Presentation due to: Will undergo surgery in the next few weeks   Clinical Decision Making Moderate   Rehab Potential Good   Clinical Impairments Affecting Rehab Potential Hx previous breast cancer on same side   PT Frequency --  Will f/u with pt at 3 weeks post op to reassess and determine PT needs   PT Treatment/Interventions Patient/family education;Therapeutic exercise   PT Next Visit Plan Will f/u 3 weeks post op to determine PT needs and reassess   PT Home Exercise Plan Post op shoulder ROM HEP   Consulted and Agree with Plan of Care Patient      Patient will benefit from skilled therapeutic intervention in order to improve the following deficits and impairments:  Postural dysfunction, Decreased knowledge of precautions, Pain, Impaired UE functional use, Decreased range of motion  Visit Diagnosis: Carcinoma of upper-outer quadrant of left breast in female, estrogen receptor positive ([place]) - Plan: PT plan of care cert/re-cert  Abnormal posture - Plan: PT plan of care cert/re-cert  At risk for lymphedema - Plan: PT plan of care  cert/re-cert      G-Codes - 93/26/71 1538    Functional Assessment Tool Used (Outpatient Only) Clinical Judgement   Functional Limitation Other PT primary   Other PT Primary Current Status (I4580) At least 1 percent but less than 20 percent impaired, limited or restricted   Other PT Primary Goal Status (D9833) 0 percent impaired, limited or restricted     Patient will follow up at outpatient cancer rehab if needed following surgery.  If the patient requires physical therapy at that time, a specific plan will be dictated and sent to the referring physician for approval. The patient was educated today on appropriate basic range of motion exercises to begin post operatively and the importance of attending the After Breast Cancer class following surgery.  Patient was educated today on lymphedema risk reduction practices as it pertains to recommendations that will benefit the patient immediately following surgery.  She verbalized good understanding.  No additional physical therapy is indicated at this time.     Problem List Patient Active Problem List   Diagnosis Date Noted  . Malignant neoplasm of upper-outer quadrant of left breast in female, estrogen receptor positive ([place]) 06/30/2017    Annia Friendly, PT 07/06/17 3:42 PM  [place], [place], [address] Phone: [phone]   Fax:  [phone]  Name: Kathleen Hanson MRN: [id number] Date of Birth: 10-Jan-1936

## 2017-07-06 NOTE — Patient Instructions (Signed)

## 2017-07-06 NOTE — Progress Notes (Signed)
Nutrition Assessment  Reason for Assessment:  Pt seen in Breast Clinic  ASSESSMENT:   81 year old female with new diagnosis of breast cancer.  Past medical history of high cholesterol, constipation.    Patient reports normal appetite.  Medications:  reviewed  Labs: reviewed  Anthropometrics:   Height: 64 inches Weight: 133 lb BMI: 22   NUTRITION DIAGNOSIS: Food and nutrition related knowledge deficit related to new diagnosis of breast cancer as evidenced by no prior need for nutrition related information.  INTERVENTION:   Discussed and provided packet of information regarding nutritional tips for breast cancer patients.  Questions answered.  Teachback method used.  Contact information provided and patient knows to contact me with questions/concerns.    MONITORING, EVALUATION, and GOAL: Pt will consume a healthy plant based diet to maintain lean body mass throughout treatment.   Kathleen Hanson, [place], [place] Registered Dietitian [phone] (pager)

## 2017-07-06 NOTE — Progress Notes (Signed)
Radiation Oncology         [phone] ________________________________  Multidisciplinary Breast Oncology Clinic [facility]) Initial Outpatient Consultation  Name: Kathleen Hanson MRN: [id number]  Date: 07/06/2017  DOB: Feb 27, 1936  TO:IZTIWP, Holli Humbles, FNP  [name], MD   REFERRING PHYSICIAN: Autumn Messing III, MD  DIAGNOSIS: The encounter diagnosis was Malignant neoplasm of upper-outer quadrant of left breast in female, estrogen receptor positive ([place]).    ICD-10-CM   1. Malignant neoplasm of upper-outer quadrant of left breast in female, estrogen receptor positive ([place]) C50.412    Z17.0     HISTORY OF PRESENT ILLNESS::Kathleen Hanson is a 81 y.o. female who is here because of newly diagnosed malignant neoplasm of UOQ of left breast. Pt underwent a bilateral screening mammogram on 06/16/2017 with results revealing: IMPRESSION: Further evaluation is suggested for possible distortion and calcifications in the left breast. Pt had a left mammogram and left [place] completed on 06/23/2017 with results revealing: IMPRESSION: Indeterminate mass in the upper-outer left breast. Patient's previous mammograms from 2017 and 2016 were negative for malignancy. She had left breast biopsy completed on 06/28/2017 with results revealing: Breast, left, needle core biopsy, upper outer quadrant, 2:30 o'clock, 4cm from nipple. - INVASIVE MAMMARY CARCINOMA WITH CALCIFICATIONS. Microscopic Comment The carcinoma appears grade 3. Per patient previous notes, she was diagnosed with left breast cancer in 2001, with a left lumpectomy, and she was treated with chemotherapy and radiotherapy. Per patient chart review, she has a sister who was diagnosed with breast cancer at age 43.   The patient was referred today for presentation in the multidisciplinary conference. Radiology studies and pathology slides were presented there for review and discussion of treatment options.  A consensus was discussed regarding potential next  steps.   GYN HISTORY  Menarchal: 76-53 years old LMP: 1992, no longer having menstrual cycles Contraceptive: Negative HRT: Negative  GP: G1P1  PREVIOUS RADIATION THERAPY: yes, [place], ~2001   PAST MEDICAL HISTORY:  has a past medical history of Breast cancer ([place]); Constipation; and Hyperlipidemia.    PAST SURGICAL HISTORY: Past Surgical History:  Procedure Laterality Date  . BREAST EXCISIONAL BIOPSY Right   . BREAST LUMPECTOMY Left    chemo and radiation  . CESAREAN SECTION    . TONSILLECTOMY      FAMILY HISTORY: family history includes Breast cancer (age of onset: 62) in her sister; Cancer in her father.  SOCIAL HISTORY:  reports that she has quit smoking. She has a 7.50 pack-year smoking history. She has never used smokeless tobacco. She reports that she drinks about 0.6 oz of alcohol per week . She reports that she does not use drugs.  ALLERGIES: Patient has no known allergies.  MEDICATIONS:  Current Outpatient Prescriptions  Medication Sig Dispense Refill  . Ascorbic Acid (VITAMIN C) 1000 MG tablet Take by mouth.    Marland Kitchen aspirin 81 MG chewable tablet Chew 81 mg by mouth.    . Multiple Vitamin (MULTIVITAMIN) capsule Take 1 capsule by mouth.    . polyethylene glycol powder (GLYCOLAX/MIRALAX) powder MIX 17 GRAMS IN LIQUID AND DRINK DAILY    . simvastatin (ZOCOR) 20 MG tablet Take 20 mg by mouth.     No current facility-administered medications for this encounter.     REVIEW OF SYSTEMS: A 10+ POINT REVIEW OF SYSTEMS WAS OBTAINED including neurology, dermatology, psychiatry, cardiac, respiratory, lymph, extremities, GI, GU, musculoskeletal, constitutional, reproductive, HEENT. All pertinent positives are noted in the HPI. All others are negative.  PHYSICAL EXAM:Lungs are clear to auscultation bilaterally. Heart has regular rate and rhythm. No palpable cervical, supraclavicular, or axillary adenopathy. Abdomen soft, non-tender, normal bowel sounds. Right breast  with no palpable mass or nipple discharge. Left breast with 2 scars in the UOQ from previous breast cancer surgery. She has bruising throughout the left breast, there appears to be a palpable mass in the UOQ that measure 1.5 x 2 cm in size, some of this could be bruising. No nipple discharge or bleeding.    KPS = 90    LABORATORY DATA:  Lab Results  Component Value Date   WBC 4.8 07/06/2017   HGB 14.2 07/06/2017   HCT 42.3 07/06/2017   MCV 93.0 07/06/2017   PLT 201 07/06/2017   Lab Results  Component Value Date   NA 137 07/06/2017   K 4.3 07/06/2017   CO2 26 07/06/2017   Lab Results  Component Value Date   ALT 16 07/06/2017   AST 18 07/06/2017   ALKPHOS 88 07/06/2017   BILITOT 0.52 07/06/2017     RADIOGRAPHY: US Breast Ltd Uni Left Inc Axilla  Result Date: 06/23/2017 CLINICAL DATA:  Screening recall for possible left breast distortion and calcifications. Patient has history of prior left breast cancer post lumpectomy 2001. EXAM: 2D DIGITAL DIAGNOSTIC UNILATERAL LEFT MAMMOGRAM WITH CAD AND ADJUNCT TOMO LEFT BREAST ULTRASOUND COMPARISON:  Previous exam(s). ACR Breast Density Category c: The breast tissue is heterogeneously dense, which may obscure small masses. FINDINGS: Spot compression CC and MLO tomograms were performed over the upper-outer left breast demonstrating a persistent asymmetry/possible mass. Postsurgical changes with associated distortion are seen in the upper-outer left breast related to prior lumpectomy. Spot compression magnification views of the upper-outer left breast demonstrate scattered round and punctate calcifications as well as dystrophic calcifications with a probable suture calcification, all benign in appearance. Mammographic images were processed with CAD. Physical examination of the left breast reveals 2 horizontally oriented scars above the level of the nipple. No definite underlying palpable masses. Targeted ultrasound of the left breast was performed  demonstrating a hypoechoic mass with irregular margins at the 2:30 position 4 cm from nipple measuring 1.4 x 0.8 x 1.2 cm. Calcifications are seen within this mass. This could represent an area of fibrocystic change, however cannot be clearly characterized as such. No lymphadenopathy seen in the left axilla. IMPRESSION: Indeterminate mass in the upper-outer left breast. RECOMMENDATION: Ultrasound-guided biopsy of the mass in the upper-outer left breast is recommended. This is being scheduled for the patient. I have discussed the findings and recommendations with the patient. Results were also provided in writing at the conclusion of the visit. If applicable, a reminder letter will be sent to the patient regarding the next appointment. BI-RADS CATEGORY  4: Suspicious. Electronically Signed   By: Everlean Alstrom M.D.   On: 06/23/2017 16:28   Mm Diag Breast Tomo Uni Left  Result Date: 06/23/2017 CLINICAL DATA:  Screening recall for possible left breast distortion and calcifications. Patient has history of prior left breast cancer post lumpectomy 2001. EXAM: 2D DIGITAL DIAGNOSTIC UNILATERAL LEFT MAMMOGRAM WITH CAD AND ADJUNCT TOMO LEFT BREAST ULTRASOUND COMPARISON:  Previous exam(s). ACR Breast Density Category c: The breast tissue is heterogeneously dense, which may obscure small masses. FINDINGS: Spot compression CC and MLO tomograms were performed over the upper-outer left breast demonstrating a persistent asymmetry/possible mass. Postsurgical changes with associated distortion are seen in the upper-outer left breast related to prior lumpectomy. Spot compression magnification views of the upper-outer  left breast demonstrate scattered round and punctate calcifications as well as dystrophic calcifications with a probable suture calcification, all benign in appearance. Mammographic images were processed with CAD. Physical examination of the left breast reveals 2 horizontally oriented scars above the level of the  nipple. No definite underlying palpable masses. Targeted ultrasound of the left breast was performed demonstrating a hypoechoic mass with irregular margins at the 2:30 position 4 cm from nipple measuring 1.4 x 0.8 x 1.2 cm. Calcifications are seen within this mass. This could represent an area of fibrocystic change, however cannot be clearly characterized as such. No lymphadenopathy seen in the left axilla. IMPRESSION: Indeterminate mass in the upper-outer left breast. RECOMMENDATION: Ultrasound-guided biopsy of the mass in the upper-outer left breast is recommended. This is being scheduled for the patient. I have discussed the findings and recommendations with the patient. Results were also provided in writing at the conclusion of the visit. If applicable, a reminder letter will be sent to the patient regarding the next appointment. BI-RADS CATEGORY  4: Suspicious. Electronically Signed   By: Everlean Alstrom M.D.   On: 06/23/2017 16:28   Mm Screening Breast Tomo Bilateral  Result Date: 06/17/2017 CLINICAL DATA:  Screening. EXAM: 2D DIGITAL SCREENING BILATERAL MAMMOGRAM WITH CAD AND ADJUNCT TOMO COMPARISON:  Previous exam(s). ACR Breast Density Category c: The breast tissue is heterogeneously dense, which may obscure small masses. FINDINGS: In the left breast, possible distortion and calcifications warrant further evaluation. In the right breast, no findings suspicious for malignancy. Images were processed with CAD. IMPRESSION: Further evaluation is suggested for possible distortion and calcifications in the left breast. RECOMMENDATION: Diagnostic mammogram and possibly ultrasound of the left breast. (Code:[id number]) The patient will be contacted regarding the findings, and additional imaging will be scheduled. BI-RADS CATEGORY  0: Incomplete. Need additional imaging evaluation and/or prior mammograms for comparison. Electronically Signed   By: Ammie Ferrier M.D.   On: 06/17/2017 12:21   Mm Clip Placement  Left  Result Date: 06/28/2017 CLINICAL DATA:  Confirmation clip placement after ultrasound-guided core needle biopsy of an indeterminate mass in the upper outer quadrant of the left breast. EXAM: DIAGNOSTIC LEFT MAMMOGRAM POST ULTRASOUND BIOPSY COMPARISON:  Previous exam(s). FINDINGS: Mammographic images were obtained following ultrasound guided biopsy of biopsy of an indeterminate mass in the upper outer quadrant of the left breast. The coil shaped tissue marker clip is appropriately positioned in the upper outer quadrant at the site of the biopsied mass. Expected post biopsy changes are present without evidence of hematoma. IMPRESSION: Appropriate positioning of the ribbon shaped tissue marker clip at the site of the biopsied mass in the upper outer quadrant of the left breast. Final Assessment: Post Procedure Mammograms for Marker Placement Electronically Signed   By: Evangeline Dakin M.D.   On: 06/28/2017 15:35   [place] Lt Breast Bx W Loc Dev 1st Lesion Img Bx Spec US Guide  Addendum Date: 06/30/2017   ADDENDUM REPORT: 06/29/2017 14:54 ADDENDUM: Pathology revealed GRADE III INVASIVE MAMMARY CARCINOMA WITH CALCIFICATIONS of the Left breast, 2:30 o'clock, 4 cm from nipple. This was found to be concordant by Dr. Peggye Fothergill. Pathology results were discussed with the patient by telephone. The patient reported doing well after the biopsy with tenderness and minimal bleeding at the site. Post biopsy instructions and care were reviewed and questions were answered. The patient was encouraged to call [facility] for any additional concerns. The patient was referred to [facility]  at [facility] on July 06, 2017. Pathology results reported by Terie Purser, RN on 06/29/2017. Electronically Signed   By: Evangeline Dakin M.D.   On: 06/29/2017 14:54   Result Date: 06/30/2017 CLINICAL DATA:  81 year old with personal history of  malignant lumpectomy of the upper outer quadrant of the left breast in 2001, presenting with a screening detected 1.2 cm indeterminate mass in the upper outer quadrant of the left breast at the 2:30 o'clock position approximately 4 cm from the nipple. EXAM: ULTRASOUND GUIDED LEFT BREAST CORE NEEDLE BIOPSY COMPARISON:  Previous exam(s). FINDINGS: I met with the patient and we discussed the procedure of ultrasound-guided biopsy, including benefits and alternatives. We discussed the high likelihood of a successful procedure. We discussed the risks of the procedure, including infection, bleeding, tissue injury, clip migration, and inadequate sampling. Informed written consent was given. The usual time-out protocol was performed immediately prior to the procedure. Lesion quadrant: Upper outer quadrant. Using sterile technique with chlorhexidine as skin antisepsis, 1% Lidocaine as local anesthetic, under direct ultrasound visualization, a 12 gauge Bard Marquee core needle device was used to perform biopsy of the indeterminate mass in the upper outer quadrant of the left breast at the 2:30 o'clock position approximately 4 cm from using a lateral approach. At the conclusion of the procedure a ribbon tissue marker clip was deployed into the biopsy cavity. Follow up 2 view mammogram was performed and dictated separately. IMPRESSION: Ultrasound guided biopsy of an indeterminate mass in the upper outer quadrant of the left breast. No apparent complications. Electronically Signed: By: Evangeline Dakin M.D. On: 06/28/2017 15:33      IMPRESSION: Clinical stage T1c invasive ductal carcinoma of left breast. (triple positive) Patient has previous radiation directed at the left breast and is not a good candidate for breast conservation due to previous radiation treatment.   PLAN: Recommend left mastectomy with attempt sentinel node biopsy with port placement. We also recommend adjuvant chemotherapy, echo chemo class, aromatase  inhibitor after chemotherapy is complete. We recommend genetics testing.       ------------------------------------------------  Blair Promise, PhD, MD  This document serves as a record of services personally performed by [name], MD. It was created on her behalf by Steva Colder, a trained medical scribe. The creation of this record is based on the scribe's personal observations and the provider's statements to them. This document has been checked and approved by the attending provider.

## 2017-07-06 NOTE — Progress Notes (Signed)
[facility] Psychosocial Distress Screening Spiritual Care  Met with Jya in [facility] to introduce [facility] team/resources, reviewing distress screen per protocol.  The patient scored a 1 on the Psychosocial Distress Thermometer which indicates mild distress. Also assessed for distress and other psychosocial needs.   ONCBCN DISTRESS SCREENING 07/06/2017  Screening Type Initial Screening  Distress experienced in past week (1-10) 1  Emotional problem type Adjusting to illness  Information Concerns Type Lack of info about diagnosis;Lack of info about treatment  Referral to support programs Yes   [place] is very appreciative of [facility] team and availability of [place] team/programming, too.  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 Per pt, she gets a lot of enjoyment from that work and also knows that she will need energy to focus on tx.  She knows that [facility] is available for help with discernment if desired.  Follow up needed: No.  Misty has full packet of [place] and is aware of ongoing Team availability, but please also page if immediate needs arise.  Thank you.   [place], [place], [facility] Pager [phone] Voicemail [phone]

## 2017-07-07 ENCOUNTER — Telehealth: Payer: Self-pay | Admitting: Hematology

## 2017-07-07 ENCOUNTER — Ambulatory Visit: Payer: Self-pay | Admitting: General Surgery

## 2017-07-07 DIAGNOSIS — Z170 Estrogen receptor positive status [ER+]: Principal | ICD-10-CM

## 2017-07-07 DIAGNOSIS — C50412 Malignant neoplasm of upper-outer quadrant of left female breast: Secondary | ICD-10-CM

## 2017-07-07 NOTE — Telephone Encounter (Signed)
No 9/12 los.  

## 2017-07-11 ENCOUNTER — Telehealth: Payer: Self-pay | Admitting: *Deleted

## 2017-07-11 NOTE — Telephone Encounter (Signed)
Spoke to pt concerning [place] from 9.12.18. Denies questions or concerns regarding dx or treatment careplan. Informed pt that echo is r/s to 9/18 d/t sx on 9/19. Pt wishes for chemo class and genetics to be scheduled after surgery. Those appts were cx. Encourage pt to call with further needs or questions. Received verbal understanding.

## 2017-07-12 ENCOUNTER — Ambulatory Visit (HOSPITAL_COMMUNITY)
Admission: RE | Admit: 2017-07-12 | Discharge: 2017-07-12 | Disposition: A | Discharge status: Home / Self Care | Payer: Medicare HMO | Source: Ambulatory Visit | Attending: Hematology | Admitting: Hematology

## 2017-07-12 ENCOUNTER — Encounter (HOSPITAL_COMMUNITY)
Admission: RE | Admit: 2017-07-12 | Discharge: 2017-07-12 | Disposition: A | Discharge status: Home / Self Care | Payer: Medicare HMO | Source: Ambulatory Visit | Attending: General Surgery | Admitting: General Surgery

## 2017-07-12 ENCOUNTER — Other Ambulatory Visit: Payer: Self-pay

## 2017-07-12 ENCOUNTER — Encounter (HOSPITAL_COMMUNITY): Payer: Self-pay

## 2017-07-12 DIAGNOSIS — Z170 Estrogen receptor positive status [ER+]: Secondary | ICD-10-CM | POA: Diagnosis not present

## 2017-07-12 DIAGNOSIS — C50412 Malignant neoplasm of upper-outer quadrant of left female breast: Secondary | ICD-10-CM | POA: Insufficient documentation

## 2017-07-12 DIAGNOSIS — Z01818 Encounter for other preprocedural examination: Secondary | ICD-10-CM | POA: Diagnosis not present

## 2017-07-12 DIAGNOSIS — I5030 Unspecified diastolic (congestive) heart failure: Secondary | ICD-10-CM | POA: Diagnosis not present

## 2017-07-12 LAB — BASIC METABOLIC PANEL
Anion gap: 8 (ref 5–15)
BUN: 14 mg/dL (ref 6–20)
CHLORIDE: 103 mmol/L (ref 101–111)
CO2: 24 mmol/L (ref 22–32)
Calcium: 8.9 mg/dL (ref 8.9–10.3)
Creatinine, Ser: 0.56 mg/dL (ref 0.44–1.00)
GFR calc Af Amer: >60 mL/min (ref >60)
GLUCOSE: 98 mg/dL (ref 65–99)
POTASSIUM: 4 mmol/L (ref 3.5–5.1)
Sodium: 135 mmol/L (ref 135–145)

## 2017-07-12 LAB — CBC
HEMATOCRIT: 41.1 % (ref 36.0–46.0)
HEMOGLOBIN: 13.7 g/dL (ref 12.0–15.0)
MCH: 30.7 pg (ref 26.0–34.0)
MCHC: 33.3 g/dL (ref 30.0–36.0)
MCV: 92.2 fL (ref 78.0–100.0)
Platelets: 220 10*3/uL (ref 150–400)
RBC: 4.46 MIL/uL (ref 3.87–5.11)
RDW: 13.1 % (ref 11.5–15.5)
WBC: 6 10*3/uL (ref 4.0–10.5)

## 2017-07-12 MED ORDER — CHLORHEXIDINE GLUCONATE CLOTH 2 % EX PADS: 6.0000 | MEDICATED_PAD | Freq: Once | CUTANEOUS | Status: DC

## 2017-07-12 MED ORDER — CHLORHEXIDINE GLUCONATE CLOTH 2 % EX PADS
6.0000 | MEDICATED_PAD | Freq: Once | CUTANEOUS | Status: DC
Start: 2017-07-12 — End: 2017-07-13

## 2017-07-12 NOTE — Pre-Procedure Instructions (Signed)
Kathleen Hanson [facility]  07/12/2017      [id number] [address], [place] [address] Phone: [phone] Fax: [phone]    Your procedure is scheduled on July 13, 2017.  Report to [facility] Admitting at 800 AM.  Call this number if you have problems the morning of surgery:  [phone]   Remember:  Do not eat food or drink liquids after midnight.  Take these medicines the morning of surgery with A SIP OF WATER (none).  7 days prior to surgery STOP taking any Aspirin, Aleve, Naproxen, Ibuprofen, Motrin, Advil, Goody's, BC's, all herbal medications, fish oil, and all vitamins   Do not wear jewelry, make-up or nail polish.  Do not wear lotions, powders, or perfumes, or deoderant.  Do not shave 48 hours prior to surgery.  Do not bring valuables to the hospital.  [facility] is not responsible for any belongings or valuables.  Contacts, dentures or bridgework may not be worn into surgery.  Leave your suitcase in the car.  After surgery it may be brought to your room.  For patients admitted to the hospital, discharge time will be determined by your treatment team.  Patients discharged the day of surgery will not be allowed to drive home.   Special instructions:   [place]- Preparing For Surgery  Before surgery, you can play an important role. Because skin is not sterile, your skin needs to be as free of germs as possible. You can reduce the number of germs on your skin by washing with CHG (chlorahexidine gluconate) Soap before surgery.  CHG is an antiseptic cleaner which kills germs and bonds with the skin to continue killing germs even after washing.  Please do not use if you have an allergy to CHG or antibacterial soaps. If your skin becomes reddened/irritated stop using the CHG.  Do not shave (including legs and underarms) for at least 48 hours prior to first CHG shower. It is OK to shave your  face.  Please follow these instructions carefully.   1. Shower the NIGHT BEFORE SURGERY and the MORNING OF SURGERY with CHG.   2. If you chose to wash your hair, wash your hair first as usual with your normal shampoo.  3. After you shampoo, rinse your hair and body thoroughly to remove the shampoo.  4. Use CHG as you would any other liquid soap. You can apply CHG directly to the skin and wash gently with a scrungie or a clean washcloth.   5. Apply the CHG Soap to your body ONLY FROM THE NECK DOWN.  Do not use on open wounds or open sores. Avoid contact with your eyes, ears, mouth and genitals (private parts). Wash genitals (private parts) with your normal soap.  6. Wash thoroughly, paying special attention to the area where your surgery will be performed.  7. Thoroughly rinse your body with warm water from the neck down.  8. DO NOT shower/wash with your normal soap after using and rinsing off the CHG Soap.  9. Pat yourself dry with a CLEAN TOWEL.   10. Wear CLEAN PAJAMAS   11. Place CLEAN SHEETS on your bed the night of your first shower and DO NOT SLEEP WITH PETS.    Day of Surgery: Do not apply any deodorants/lotions. Please wear clean clothes to the hospital/surgery center.     Please read over the following fact sheets that you were given. Pain Booklet, Coughing and  Deep Breathing and Surgical Site Infection Prevention

## 2017-07-12 NOTE — Progress Notes (Addendum)
[facility]:SEGB, Russ Halo, PhD  Cardiologist: pt denies  EKG: pt denies past year-hx of PVCs per pt  Stress test: 5+ years ago  ECHO: 06/2017-in EPIC  Cardiac Cath: pt denies ever  Chest x-ray: pt denies past year

## 2017-07-12 NOTE — Progress Notes (Signed)
  Echocardiogram 2D Echocardiogram has been performed.  Kathleen Hanson Kathleen Hanson 07/12/2017, 9:55 AM

## 2017-07-13 ENCOUNTER — Ambulatory Visit (HOSPITAL_COMMUNITY): Payer: Medicare HMO | Admitting: Certified Registered Nurse Anesthetist

## 2017-07-13 ENCOUNTER — Ambulatory Visit (HOSPITAL_COMMUNITY)
Admission: RE | Admit: 2017-07-13 | Discharge: 2017-07-14 | Disposition: A | Discharge status: Home / Self Care | Payer: Medicare HMO | Source: Ambulatory Visit | Attending: General Surgery | Admitting: General Surgery

## 2017-07-13 ENCOUNTER — Encounter (HOSPITAL_COMMUNITY)
Admission: RE | Disposition: A | Discharge status: Home / Self Care | Payer: Self-pay | Source: Ambulatory Visit | Attending: General Surgery

## 2017-07-13 ENCOUNTER — Ambulatory Visit (HOSPITAL_COMMUNITY): Payer: Medicare HMO

## 2017-07-13 ENCOUNTER — Encounter (HOSPITAL_COMMUNITY): Payer: Self-pay

## 2017-07-13 ENCOUNTER — Ambulatory Visit (HOSPITAL_COMMUNITY)
Admission: RE | Admit: 2017-07-13 | Discharge: 2017-07-13 | Disposition: A | Discharge status: Home / Self Care | Payer: Medicare HMO | Source: Ambulatory Visit | Attending: General Surgery | Admitting: General Surgery

## 2017-07-13 DIAGNOSIS — E7800 Pure hypercholesterolemia, unspecified: Secondary | ICD-10-CM | POA: Insufficient documentation

## 2017-07-13 DIAGNOSIS — Z419 Encounter for procedure for purposes other than remedying health state, unspecified: Secondary | ICD-10-CM

## 2017-07-13 DIAGNOSIS — Z170 Estrogen receptor positive status [ER+]: Secondary | ICD-10-CM | POA: Insufficient documentation

## 2017-07-13 DIAGNOSIS — C50412 Malignant neoplasm of upper-outer quadrant of left female breast: Secondary | ICD-10-CM | POA: Insufficient documentation

## 2017-07-13 DIAGNOSIS — Z923 Personal history of irradiation: Secondary | ICD-10-CM | POA: Diagnosis not present

## 2017-07-13 DIAGNOSIS — Z95828 Presence of other vascular implants and grafts: Secondary | ICD-10-CM

## 2017-07-13 DIAGNOSIS — Z79899 Other long term (current) drug therapy: Secondary | ICD-10-CM | POA: Insufficient documentation

## 2017-07-13 DIAGNOSIS — I700 Atherosclerosis of aorta: Secondary | ICD-10-CM | POA: Diagnosis not present

## 2017-07-13 DIAGNOSIS — Z87891 Personal history of nicotine dependence: Secondary | ICD-10-CM | POA: Diagnosis not present

## 2017-07-13 DIAGNOSIS — Z1501 Genetic susceptibility to malignant neoplasm of breast: Secondary | ICD-10-CM | POA: Diagnosis not present

## 2017-07-13 HISTORY — DX: Diffuse cystic mastopathy of unspecified breast: N60.19

## 2017-07-13 HISTORY — PX: MASTECTOMY W/ SENTINEL NODE BIOPSY: SHX2001

## 2017-07-13 HISTORY — PX: PORTACATH PLACEMENT: SHX2246

## 2017-07-13 HISTORY — PX: MASTECTOMY COMPLETE / SIMPLE W/ SENTINEL NODE BIOPSY: SUR846

## 2017-07-13 HISTORY — DX: Malignant neoplasm of unspecified site of left female breast: C50.912

## 2017-07-13 HISTORY — DX: Migraine with aura, not intractable, without status migrainosus: G43.109

## 2017-07-13 SURGERY — MASTECTOMY WITH SENTINEL LYMPH NODE BIOPSY
Anesthesia: General | Laterality: Right

## 2017-07-13 MED ORDER — METHYLENE BLUE 0.5 % INJ SOLN
INTRAVENOUS | Status: AC
  Filled 2017-07-13: qty 10

## 2017-07-13 MED ORDER — SUCCINYLCHOLINE CHLORIDE 200 MG/10ML IV SOSY
PREFILLED_SYRINGE | INTRAVENOUS | Status: AC
  Filled 2017-07-13: qty 10

## 2017-07-13 MED ORDER — FENTANYL CITRATE (PF) 100 MCG/2ML IJ SOLN: 25.0000 ug | INTRAMUSCULAR | Status: DC | PRN

## 2017-07-13 MED ORDER — PANTOPRAZOLE SODIUM 40 MG IV SOLR
40.0000 mg | Freq: Every day | INTRAVENOUS | Status: DC
  Administered 2017-07-13: 40 mg via INTRAVENOUS
  Filled 2017-07-13: qty 40

## 2017-07-13 MED ORDER — ROPIVACAINE HCL 7.5 MG/ML IJ SOLN
INTRAMUSCULAR | Status: DC | PRN
  Administered 2017-07-13: 20 mL via PERINEURAL

## 2017-07-13 MED ORDER — FENTANYL CITRATE (PF) 100 MCG/2ML IJ SOLN
100.0000 ug | Freq: Once | INTRAMUSCULAR | Status: AC
  Administered 2017-07-13: 100 ug via INTRAVENOUS

## 2017-07-13 MED ORDER — FENTANYL CITRATE (PF) 250 MCG/5ML IJ SOLN
INTRAMUSCULAR | Status: AC
  Filled 2017-07-13: qty 5

## 2017-07-13 MED ORDER — PROPOFOL 10 MG/ML IV BOLUS
INTRAVENOUS | Status: AC
  Filled 2017-07-13: qty 20

## 2017-07-13 MED ORDER — CEFAZOLIN SODIUM-DEXTROSE 2-4 GM/100ML-% IV SOLN
2.0000 g | INTRAVENOUS | Status: AC
  Administered 2017-07-13: 2 g via INTRAVENOUS

## 2017-07-13 MED ORDER — IOPAMIDOL (ISOVUE-300) INJECTION 61%
50.0000 mL | Freq: Once | INTRAVENOUS | Status: AC | PRN
  Administered 2017-07-13: 50 mL via INTRAVENOUS

## 2017-07-13 MED ORDER — HEPARIN SOD (PORK) LOCK FLUSH 100 UNIT/ML IV SOLN
INTRAVENOUS | Status: DC | PRN
  Administered 2017-07-13: 500 [IU] via INTRAVENOUS

## 2017-07-13 MED ORDER — DEXAMETHASONE SODIUM PHOSPHATE 10 MG/ML IJ SOLN
INTRAMUSCULAR | Status: DC | PRN
  Administered 2017-07-13: 10 mg via INTRAVENOUS

## 2017-07-13 MED ORDER — ACETAMINOPHEN 500 MG PO TABS
ORAL_TABLET | ORAL | Status: AC
  Administered 2017-07-13: 1000 mg via ORAL
  Filled 2017-07-13: qty 2

## 2017-07-13 MED ORDER — LIDOCAINE 2% (20 MG/ML) 5 ML SYRINGE
INTRAMUSCULAR | Status: AC
  Filled 2017-07-13: qty 5

## 2017-07-13 MED ORDER — SUGAMMADEX SODIUM 200 MG/2ML IV SOLN
INTRAVENOUS | Status: DC | PRN
  Administered 2017-07-13: 120 mg via INTRAVENOUS

## 2017-07-13 MED ORDER — MORPHINE SULFATE (PF) 4 MG/ML IV SOLN: 1.0000 mg | INTRAVENOUS | Status: DC | PRN

## 2017-07-13 MED ORDER — IOPAMIDOL (ISOVUE-300) INJECTION 61%
30.0000 mL | Freq: Once | INTRAVENOUS | Status: AC | PRN
  Administered 2017-07-13: 30 mL via INTRAVENOUS

## 2017-07-13 MED ORDER — METHOCARBAMOL 500 MG PO TABS: 500.0000 mg | ORAL_TABLET | Freq: Four times a day (QID) | ORAL | Status: DC | PRN

## 2017-07-13 MED ORDER — VITAMIN C 500 MG PO TABS
500.0000 mg | ORAL_TABLET | Freq: Every day | ORAL | Status: DC
  Administered 2017-07-13 – 2017-07-14 (×2): 500 mg via ORAL
  Filled 2017-07-13 (×2): qty 1

## 2017-07-13 MED ORDER — 0.9 % SODIUM CHLORIDE (POUR BTL) OPTIME
TOPICAL | Status: DC | PRN
  Administered 2017-07-13: 1000 mL

## 2017-07-13 MED ORDER — GABAPENTIN 300 MG PO CAPS
ORAL_CAPSULE | ORAL | Status: AC
  Administered 2017-07-13: 300 mg via ORAL
  Filled 2017-07-13: qty 1

## 2017-07-13 MED ORDER — CELECOXIB 200 MG PO CAPS
ORAL_CAPSULE | ORAL | Status: AC
  Administered 2017-07-13: 200 mg via ORAL
  Filled 2017-07-13: qty 1

## 2017-07-13 MED ORDER — ROCURONIUM BROMIDE 100 MG/10ML IV SOLN
INTRAVENOUS | Status: DC | PRN
  Administered 2017-07-13: 50 mg via INTRAVENOUS

## 2017-07-13 MED ORDER — TECHNETIUM TC 99M SULFUR COLLOID FILTERED
1.0000 | Freq: Once | INTRAVENOUS | Status: AC | PRN
  Administered 2017-07-13: 1 via INTRADERMAL

## 2017-07-13 MED ORDER — GABAPENTIN 300 MG PO CAPS
300.0000 mg | ORAL_CAPSULE | ORAL | Status: AC
  Administered 2017-07-13: 300 mg via ORAL

## 2017-07-13 MED ORDER — PROPOFOL 10 MG/ML IV BOLUS
INTRAVENOUS | Status: DC | PRN
  Administered 2017-07-13: 120 mg via INTRAVENOUS
  Administered 2017-07-13: 40 mg via INTRAVENOUS

## 2017-07-13 MED ORDER — OXYCODONE HCL 5 MG/5ML PO SOLN: 5.0000 mg | Freq: Once | ORAL | Status: DC | PRN

## 2017-07-13 MED ORDER — ONDANSETRON HCL 4 MG/2ML IJ SOLN
INTRAMUSCULAR | Status: AC
  Filled 2017-07-13: qty 10

## 2017-07-13 MED ORDER — BUPIVACAINE HCL (PF) 0.25 % IJ SOLN
INTRAMUSCULAR | Status: DC | PRN
  Administered 2017-07-13: 10 mL

## 2017-07-13 MED ORDER — FENTANYL CITRATE (PF) 100 MCG/2ML IJ SOLN
INTRAMUSCULAR | Status: DC | PRN
  Administered 2017-07-13: 100 ug via INTRAVENOUS

## 2017-07-13 MED ORDER — OXYCODONE HCL 5 MG PO TABS: 5.0000 mg | ORAL_TABLET | Freq: Once | ORAL | Status: DC | PRN

## 2017-07-13 MED ORDER — HYDROCODONE-ACETAMINOPHEN 5-325 MG PO TABS: 1.0000 | ORAL_TABLET | ORAL | Status: DC | PRN

## 2017-07-13 MED ORDER — ROCURONIUM BROMIDE 10 MG/ML (PF) SYRINGE
PREFILLED_SYRINGE | INTRAVENOUS | Status: AC
  Filled 2017-07-13: qty 10

## 2017-07-13 MED ORDER — ACETAMINOPHEN 160 MG/5ML PO SOLN: 325.0000 mg | ORAL | Status: DC | PRN

## 2017-07-13 MED ORDER — SIMVASTATIN 40 MG PO TABS
40.0000 mg | ORAL_TABLET | Freq: Every day | ORAL | Status: DC
  Administered 2017-07-13: 40 mg via ORAL
  Filled 2017-07-13: qty 1

## 2017-07-13 MED ORDER — ACETAMINOPHEN 500 MG PO TABS
1000.0000 mg | ORAL_TABLET | ORAL | Status: AC
  Administered 2017-07-13: 1000 mg via ORAL

## 2017-07-13 MED ORDER — ONDANSETRON 4 MG PO TBDP: 4.0000 mg | ORAL_TABLET | Freq: Four times a day (QID) | ORAL | Status: DC | PRN

## 2017-07-13 MED ORDER — ONDANSETRON HCL 4 MG/2ML IJ SOLN: 4.0000 mg | Freq: Four times a day (QID) | INTRAMUSCULAR | Status: DC | PRN

## 2017-07-13 MED ORDER — HEPARIN SODIUM (PORCINE) 5000 UNIT/ML IJ SOLN
5000.0000 [IU] | Freq: Three times a day (TID) | INTRAMUSCULAR | Status: DC
  Administered 2017-07-14: 5000 [IU] via SUBCUTANEOUS
  Filled 2017-07-13: qty 1

## 2017-07-13 MED ORDER — DEXAMETHASONE SODIUM PHOSPHATE 10 MG/ML IJ SOLN
INTRAMUSCULAR | Status: AC
  Filled 2017-07-13: qty 3

## 2017-07-13 MED ORDER — DEXAMETHASONE SODIUM PHOSPHATE 10 MG/ML IJ SOLN
INTRAMUSCULAR | Status: AC
  Filled 2017-07-13: qty 1

## 2017-07-13 MED ORDER — LACTATED RINGERS IV SOLN
INTRAVENOUS | Status: DC
  Administered 2017-07-13: 09:00:00 via INTRAVENOUS

## 2017-07-13 MED ORDER — SUGAMMADEX SODIUM 200 MG/2ML IV SOLN
INTRAVENOUS | Status: AC
  Filled 2017-07-13: qty 2

## 2017-07-13 MED ORDER — EPHEDRINE SULFATE 50 MG/ML IJ SOLN
INTRAMUSCULAR | Status: DC | PRN
  Administered 2017-07-13 (×3): 10 mg via INTRAVENOUS
  Administered 2017-07-13: 5 mg via INTRAVENOUS
  Administered 2017-07-13: 10 mg via INTRAVENOUS

## 2017-07-13 MED ORDER — CEFAZOLIN SODIUM-DEXTROSE 2-4 GM/100ML-% IV SOLN
INTRAVENOUS | Status: AC
  Filled 2017-07-13: qty 100

## 2017-07-13 MED ORDER — HEPARIN SOD (PORK) LOCK FLUSH 100 UNIT/ML IV SOLN
INTRAVENOUS | Status: AC
  Filled 2017-07-13: qty 5

## 2017-07-13 MED ORDER — MIDAZOLAM HCL 2 MG/2ML IJ SOLN
INTRAMUSCULAR | Status: AC
  Filled 2017-07-13: qty 2

## 2017-07-13 MED ORDER — ONDANSETRON HCL 4 MG/2ML IJ SOLN
INTRAMUSCULAR | Status: DC | PRN
  Administered 2017-07-13: 4 mg via INTRAVENOUS

## 2017-07-13 MED ORDER — CELECOXIB 200 MG PO CAPS
200.0000 mg | ORAL_CAPSULE | ORAL | Status: AC
  Administered 2017-07-13: 200 mg via ORAL

## 2017-07-13 MED ORDER — KCL IN DEXTROSE-NACL 20-5-0.9 MEQ/L-%-% IV SOLN
INTRAVENOUS | Status: DC
  Administered 2017-07-13: 18:00:00 via INTRAVENOUS
  Filled 2017-07-13 (×2): qty 1000

## 2017-07-13 MED ORDER — ACETAMINOPHEN 325 MG PO TABS: 325.0000 mg | ORAL_TABLET | ORAL | Status: DC | PRN

## 2017-07-13 MED ORDER — ONDANSETRON HCL 4 MG/2ML IJ SOLN
INTRAMUSCULAR | Status: AC
  Filled 2017-07-13: qty 2

## 2017-07-13 MED ORDER — LIDOCAINE HCL (CARDIAC) 20 MG/ML IV SOLN
INTRAVENOUS | Status: DC | PRN
  Administered 2017-07-13: 60 mg via INTRAVENOUS

## 2017-07-13 MED ORDER — SODIUM CHLORIDE 0.9 % IV SOLN
INTRAVENOUS | Status: DC | PRN
  Administered 2017-07-13: 11:00:00

## 2017-07-13 MED ORDER — EPHEDRINE 5 MG/ML INJ
INTRAVENOUS | Status: AC
  Filled 2017-07-13: qty 30

## 2017-07-13 MED ORDER — PHENYLEPHRINE 40 MCG/ML (10ML) SYRINGE FOR IV PUSH (FOR BLOOD PRESSURE SUPPORT)
PREFILLED_SYRINGE | INTRAVENOUS | Status: AC
  Filled 2017-07-13: qty 10

## 2017-07-13 MED ORDER — FENTANYL CITRATE (PF) 100 MCG/2ML IJ SOLN
INTRAMUSCULAR | Status: AC
Start: 2017-07-13 — End: 2017-07-13
  Administered 2017-07-13: 100 ug via INTRAVENOUS
  Filled 2017-07-13: qty 2

## 2017-07-13 SURGICAL SUPPLY — 74 items
APPLIER CLIP 9.375 MED OPEN (MISCELLANEOUS) ×8
BAG DECANTER FOR FLEXI CONT (MISCELLANEOUS) ×4 IMPLANT
BINDER BREAST LRG (GAUZE/BANDAGES/DRESSINGS) IMPLANT
BINDER BREAST XLRG (GAUZE/BANDAGES/DRESSINGS) IMPLANT
BIOPATCH RED 1 DISK 7.0 (GAUZE/BANDAGES/DRESSINGS) ×3 IMPLANT
BIOPATCH RED 1IN DISK 7.0MM (GAUZE/BANDAGES/DRESSINGS) ×1
BLADE SURG 15 STRL LF DISP TIS (BLADE) ×2 IMPLANT
BLADE SURG 15 STRL SS (BLADE) ×2
CANISTER SUCT 3000ML PPV (MISCELLANEOUS) ×4 IMPLANT
CHLORAPREP W/TINT 10.5 ML (MISCELLANEOUS) ×4 IMPLANT
CHLORAPREP W/TINT 26ML (MISCELLANEOUS) ×4 IMPLANT
CLIP APPLIE 9.375 MED OPEN (MISCELLANEOUS) ×4 IMPLANT
CONT SPEC 4OZ CLIKSEAL STRL BL (MISCELLANEOUS) ×12 IMPLANT
COVER PROBE W GEL 5X96 (DRAPES) ×4 IMPLANT
COVER SURGICAL LIGHT HANDLE (MISCELLANEOUS) ×4 IMPLANT
COVER TRANSDUCER ULTRASND GEL (DRAPE) IMPLANT
CRADLE DONUT ADULT HEAD (MISCELLANEOUS) ×4 IMPLANT
DERMABOND ADVANCED (GAUZE/BANDAGES/DRESSINGS) ×2
DERMABOND ADVANCED .7 DNX12 (GAUZE/BANDAGES/DRESSINGS) ×2 IMPLANT
DEVICE DISSECT PLASMABLAD 3.0S (MISCELLANEOUS) ×2 IMPLANT
DRAIN CHANNEL 19F RND (DRAIN) ×8 IMPLANT
DRAPE C-ARM 42X72 X-RAY (DRAPES) ×4 IMPLANT
DRAPE CHEST BREAST 15X10 FENES (DRAPES) ×4 IMPLANT
DRAPE UTILITY XL STRL (DRAPES) ×8 IMPLANT
DRSG PAD ABDOMINAL 8X10 ST (GAUZE/BANDAGES/DRESSINGS) ×4 IMPLANT
DRSG TEGADERM 2-3/8X2-3/4 SM (GAUZE/BANDAGES/DRESSINGS) ×4 IMPLANT
DRSG TEGADERM 4X4.75 (GAUZE/BANDAGES/DRESSINGS) ×4 IMPLANT
ELECT CAUTERY BLADE 6.4 (BLADE) ×4 IMPLANT
ELECT REM PT RETURN 9FT ADLT (ELECTROSURGICAL) ×4
ELECTRODE REM PT RTRN 9FT ADLT (ELECTROSURGICAL) ×2 IMPLANT
EVACUATOR SILICONE 100CC (DRAIN) ×8 IMPLANT
GAUZE SPONGE 4X4 16PLY XRAY LF (GAUZE/BANDAGES/DRESSINGS) ×4 IMPLANT
GEL ULTRASOUND 20GR AQUASONIC (MISCELLANEOUS) IMPLANT
GLOVE BIO SURGEON STRL SZ7.5 (GLOVE) ×4 IMPLANT
GOWN STRL REUS W/ TWL LRG LVL3 (GOWN DISPOSABLE) ×4 IMPLANT
GOWN STRL REUS W/TWL LRG LVL3 (GOWN DISPOSABLE) ×4
INTRODUCER COOK 11FR (CATHETERS) IMPLANT
KIT BASIN OR (CUSTOM PROCEDURE TRAY) ×4 IMPLANT
KIT PORT POWER 8FR ISP CVUE (Miscellaneous) ×4 IMPLANT
KIT ROOM TURNOVER OR (KITS) ×4 IMPLANT
LIGHT WAVEGUIDE WIDE FLAT (MISCELLANEOUS) IMPLANT
NEEDLE 18GX1X1/2 (RX/OR ONLY) (NEEDLE) IMPLANT
NEEDLE 22X1 1/2 (OR ONLY) (NEEDLE) ×4 IMPLANT
NEEDLE FILTER BLUNT 18X 1/2SAF (NEEDLE)
NEEDLE FILTER BLUNT 18X1 1/2 (NEEDLE) IMPLANT
NEEDLE HYPO 25GX1X1/2 BEV (NEEDLE) ×4 IMPLANT
NS IRRIG 1000ML POUR BTL (IV SOLUTION) ×4 IMPLANT
PACK GENERAL/GYN (CUSTOM PROCEDURE TRAY) ×4 IMPLANT
PACK SURGICAL SETUP 50X90 (CUSTOM PROCEDURE TRAY) ×4 IMPLANT
PAD ARMBOARD 7.5X6 YLW CONV (MISCELLANEOUS) ×4 IMPLANT
PENCIL BUTTON HOLSTER BLD 10FT (ELECTRODE) ×4 IMPLANT
PLASMABLADE 3.0S (MISCELLANEOUS) ×4
SET INTRODUCER 12FR PACEMAKER (SHEATH) IMPLANT
SET SHEATH INTRODUCER 10FR (MISCELLANEOUS) IMPLANT
SHEATH COOK PEEL AWAY SET 9F (SHEATH) IMPLANT
SPECIMEN JAR X LARGE (MISCELLANEOUS) ×4 IMPLANT
SUT ETHILON 3 0 FSL (SUTURE) ×8 IMPLANT
SUT MNCRL AB 4-0 PS2 18 (SUTURE) ×8 IMPLANT
SUT PROLENE 2 0 SH 30 (SUTURE) ×8 IMPLANT
SUT SILK 2 0 (SUTURE)
SUT SILK 2-0 18XBRD TIE 12 (SUTURE) IMPLANT
SUT VIC AB 3-0 54X BRD REEL (SUTURE) IMPLANT
SUT VIC AB 3-0 BRD 54 (SUTURE)
SUT VIC AB 3-0 SH 18 (SUTURE) ×4 IMPLANT
SUT VIC AB 3-0 SH 27 (SUTURE) ×2
SUT VIC AB 3-0 SH 27XBRD (SUTURE) ×2 IMPLANT
SYR 10ML LL (SYRINGE) ×4 IMPLANT
SYR 20ML ECCENTRIC (SYRINGE) ×8 IMPLANT
SYR 5ML LUER SLIP (SYRINGE) ×4 IMPLANT
SYR CONTROL 10ML LL (SYRINGE) ×4 IMPLANT
TOWEL OR 17X24 6PK STRL BLUE (TOWEL DISPOSABLE) ×4 IMPLANT
TOWEL OR 17X26 10 PK STRL BLUE (TOWEL DISPOSABLE) ×4 IMPLANT
TUBE CONNECTING 12'X1/4 (SUCTIONS) ×1
TUBE CONNECTING 12X1/4 (SUCTIONS) ×3 IMPLANT

## 2017-07-13 NOTE — Progress Notes (Signed)
1715 received pt from PACU, A&O x4, walked to the bathroom right after coming to the room. Denies pain, tingling noted to left thumb,index and middle fingers. Left arm is weaker than the right arm, with strong and equal hand grips.

## 2017-07-13 NOTE — Interval H&P Note (Signed)
History and Physical Interval Note:  07/13/2017 9:40 AM  Kathleen Hanson  has presented today for surgery, with the diagnosis of left breast cancer  The various methods of treatment have been discussed with the patient and family. After consideration of risks, benefits and other options for treatment, the patient has consented to  Procedure(s): [place] (Left) INSERTION PORT-A-CATH (N/A) as a surgical intervention .  The patient's history has been reviewed, patient examined, no change in status, stable for surgery.  I have reviewed the patient's chart and labs.  Questions were answered to the patient's satisfaction.     TOTH III,Boyd Litaker S

## 2017-07-13 NOTE — Anesthesia Procedure Notes (Signed)
Anesthesia Regional Block: Pectoralis block   Pre-Anesthetic Checklist: ,, timeout performed, Correct Patient, Correct Site, Correct Laterality, Correct Procedure, Correct Position, site marked, Risks and benefits discussed,  Surgical consent,  Pre-op evaluation,  At surgeon's request and post-op pain management  Laterality: Left  Prep: chloraprep       Needles:  Injection technique: Single-shot  Needle Type: Echogenic Stimulator Needle          Additional Needles:   Narrative:  Start time: 07/13/2017 10:04 AM End time: 07/13/2017 10:11 AM Injection made incrementally with aspirations every 5 mL.  Performed by: Personally  Anesthesiologist: Jernard Reiber  Additional Notes: H+P and labs reviewed, risks and benefits discussed with patient, procedure tolerated well without complications

## 2017-07-13 NOTE — Transfer of Care (Signed)
Immediate Anesthesia Transfer of Care Note  Patient: Kathleen Hanson [facility]  Procedure(s) Performed: Procedure(s): LEFT MASTECTOMY WITH SENTINEL NODE MAPPING ERAS PATHWAY (Left) INSERTION PORT-A-CATH (Right)  Patient Location: PACU  Anesthesia Type:General  Level of Consciousness: awake, alert  and responds to stimulation  Airway & Oxygen Therapy: Patient Spontanous Breathing and Patient connected to nasal cannula oxygen  Post-op Assessment: Report given to RN and Post -op Vital signs reviewed and stable  Post vital signs: Reviewed and stable  Last Vitals:  Vitals:   07/13/17 1005 07/13/17 1010  BP: (!) 189/61 (!) 171/61  Pulse: 68 79  Resp: 14 (!) 35  Temp:    SpO2: 100% 100%    Last Pain:  Vitals:   07/13/17 0820  TempSrc: Oral         Complications: No apparent anesthesia complications

## 2017-07-13 NOTE — Anesthesia Preprocedure Evaluation (Signed)
Anesthesia Evaluation  Patient identified by MRN, date of birth, ID band Patient awake    Reviewed: Allergy & Precautions, NPO status , Patient's Chart, lab work & pertinent test results  History of Anesthesia Complications Negative for: history of anesthetic complications  Airway Mallampati: II  TM Distance: <3 FB Neck ROM: Full    Dental  (+) Partial Upper,    Pulmonary neg shortness of breath, neg sleep apnea, neg COPD, neg recent URI, former smoker,    breath sounds clear to auscultation       Cardiovascular negative cardio ROS   Rhythm:Regular     Neuro/Psych negative neurological ROS  negative psych ROS   GI/Hepatic negative GI ROS, Neg liver ROS,   Endo/Other    Renal/GU negative Renal ROS     Musculoskeletal   Abdominal   Peds  Hematology negative hematology ROS (+)   Anesthesia Other Findings   Reproductive/Obstetrics                             Anesthesia Physical Anesthesia Plan  ASA: II  Anesthesia Plan: General and Regional   Post-op Pain Management:    Induction: Intravenous  PONV Risk Score and Plan: 3 and Ondansetron, Dexamethasone and Midazolam  Airway Management Planned: Oral ETT  Additional Equipment: None  Intra-op Plan:   Post-operative Plan: Extubation in OR  Informed Consent: I have reviewed the patients History and Physical, chart, labs and discussed the procedure including the risks, benefits and alternatives for the proposed anesthesia with the patient or authorized representative who has indicated his/her understanding and acceptance.   Dental advisory given  Plan Discussed with: CRNA and Surgeon  Anesthesia Plan Comments:         Anesthesia Quick Evaluation

## 2017-07-13 NOTE — Anesthesia Procedure Notes (Addendum)
Procedure Name: Intubation Performed by: WHITE, Tipton Ballow TENA Vanshika Jastrzebski Pre-anesthesia Checklist: Patient identified, Emergency Drugs available, Suction available and Patient being monitored Patient Re-evaluated:Patient Re-evaluated prior to induction Oxygen Delivery Method: Circle System Utilized Preoxygenation: Pre-oxygenation with 100% oxygen Induction Type: IV induction Ventilation: Mask ventilation without difficulty Laryngoscope Size: Miller and 2 Grade View: Grade I Tube type: Oral Tube size: 7.0 mm Number of attempts: 2 Airway Equipment and Method: Stylet Placement Confirmation: ETT inserted through vocal cords under direct vision,  positive ETCO2 and breath sounds checked- equal and bilateral Secured at: 20 cm Tube secured with: Tape Dental Injury: Teeth and Oropharynx as per pre-operative assessment  Comments: DLx1 by SRNA with MAC 3 - grade 3 view, immediate recognition of esophageal intubation; DLx2 by CRNA with Sabra Heck 2 - grade 3 view, DLx3 with Miller 2 by MD - grade 1 view, atraumatic intubation, BBS=, +ETCO2

## 2017-07-13 NOTE — Op Note (Signed)
07/13/2017  12:45 PM  PATIENT:  Kathleen Hanson  81 y.o. female  PRE-OPERATIVE DIAGNOSIS:  left breast cancer  POST-OPERATIVE DIAGNOSIS:  left breast cancer  PROCEDURE:  Procedure(s): LEFT MASTECTOMY WITH DEEP LEFT AXILLARY SENTINEL NODE MAPPING  INSERTION PORT-A-CATH (Right)  SURGEON:  Surgeon(s) and Role:    * [name], MD - Primary  PHYSICIAN ASSISTANT:   ASSISTANTS: none   ANESTHESIA:   local and general  EBL:  Total I/O In: 900 [I.V.:900] Out: 30 [Blood:30]  BLOOD ADMINISTERED:none  DRAINS: (1) Jackson-Pratt drain(s) with closed bulb suction in the prepectoral space   LOCAL MEDICATIONS USED:  MARCAINE     SPECIMEN:  Source of Specimen:  left mastectomy and sentinel nodes X 2  DISPOSITION OF SPECIMEN:  PATHOLOGY  COUNTS:  YES  TOURNIQUET:  * No tourniquets in log *  DICTATION: .Dragon Dictation   After informed consent was obtained the patient was brought to the operating room and placed in the supine position on the operating table. After adequate induction of general anesthesia the patient's right chest wall was prepped with ChloraPrep, allowed to dry, and draped in usual sterile manner. A roll was placed between the patient's shoulder blades to extend the shoulder slightly. An appropriate timeout was performed. The area lateral to the bend of the clavicle was infiltrated with quarter percent Marcaine on the right chest wall. A large bore needle from the port kit was used to slide beneath the bend of the clavicle heading towards the sternal notch. I was unable to access the subclavian vein. I then moved to the right internal jugular area. I was able to identify the vein with a small syringe and 22-gauge needle. I was able to use the large bore needle from the port kit to access the right internal jugular vein without difficulty. The wire was fed through the needle using the Seldinger technique without difficulty. The wire was confirmed in the central venous  system using real-time fluoroscopy. Next a small incision was made on the right chest wall. A subcutaneous pocket was created inferior to this incision by blunt finger dissection. Next a hemostat and tunneling devices were used to connect the chest wall incision to the wire entry site in the neck. The tubing was brought through this tract. The tubing was placed on the reservoir and the reservoir was placed in the pocket. The length of the tubing was estimated again using real-time fluoroscopy and cut to the appropriate length. Next the sheath and dilator were fed over the wire using the Seldinger technique without difficulty. The wire and dilator were removed. The tubing was fed through the sheath as far as it would go while the sheath was gently cracked and separated. Another real-time fluoroscopy image confirmed the tip of the catheter to be in the distal superior vena cava. Next the tubing was permanently anchored to the reservoir. The reservoir was anchored in the pocket with 2 2-0 Prolene stitches. The port was then aspirated and it aspirated blood easily. The port was then flushed initially with a dilute heparin solution and then with a more concentrated heparin solution. The small incision on the neck was closed with an interrupted 4-0 Monocryl subcuticular stitch. The subcutaneous tissue was then closed over the port with interrupted 3-0 Vicryl stitches. The skin was then closed with a running 4-0 Monocryl subcuticular stitch. Dermabond dressings were applied. The patient tolerated this portion the procedure well. At the end of this portion all needle sponge and instrument  counts were correct. The drapes were then removed. The left chest, breast, and axillary area were then prepped with ChloraPrep, allowed to dry, and draped in usual sterile manner. Earlier in the day the patient underwent injection of 1 mCi of technetium sulfur colloid in the subareolar position on the left. Another appropriate timeout was  performed. An elliptical incision was then made around the nipple and areola complex with a 10 blade knife in order to minimize the excess skin. Breast hooks were used to elevate the skin flaps anteriorly towards the ceiling. Thin skin flaps were then created circumferentially using the plasma blade with dissection between the breast tissue in the subcutaneous fat. This dissection was carried all the way to the chest wall. Once this was accomplished and the breast was removed from the pectoralis muscle with the pectoralis fascia sharply with the plasma blade. Once the breast was removed it was oriented with a stitch on the lateral skin. The neoprobe was then sent to technetium and an area of radioactivity was identified in the left axilla. I was able to identify 2 hot lymph nodes in the deep left axillary space. Each of these were excised sharply with the plasma blade and the lymphatics and small vessels were controlled with clips. Ex vivo counts on these 2 nodes were approximately 200. These were sent as sentinel nodes numbers 1 and 2. The wound was then irrigated with copious amounts of saline. Hemostasis was achieved using the plasma blade. No other hot or palpable lymph nodes were identified in the left axilla. A small stab incision was then made near the anterior axillary line inferior to the operative area. A tonsil clamp was used to bring a 19 [place] round Blake drain through this opening into the operative bed. The drain was curled along the chest wall. The drain was anchored to the skin with a 3-0 nylon stitch. Next the superior and inferior skin flaps were grossly reapproximated with interrupted 3-0 Vicryl stitches. The skin was then closed with a running 4-0 Monocryl subcuticular stitch. Dermabond dressings were applied. The patient tolerated the procedure well. At the end of the case all needle sponge counts were correct. The patient was then awakened and taken to recovery in stable condition.  PLAN OF  CARE: Admit for overnight observation  PATIENT DISPOSITION:  PACU - hemodynamically stable.   Delay start of Pharmacological VTE agent (>24hrs) due to surgical blood loss or risk of bleeding: no

## 2017-07-13 NOTE — H&P (Signed)
[place]  Location: [facility] Patient #: [id number] DOB: August 15, 1936 Undefined / Language: Kathleen Hanson / Race: White Female   History of Present Illness The patient is a 81 year old female who presents with breast cancer. we are asked to see the patient in consultation by Dr. Sondra Come to evaluate her for a new left breast cancer. The patient is a 81 year old white female who presents with a new area of distortion and calcification in the lateral left breast. This measured 1.4 cm. 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 She has a history of left breast cancer in 2001 that was treated with lumpectomy followed by chemotherapy and radiation therapy.   Past Surgical History  Breast Biopsy  Left. Breast Mass; Local Excision  Left. Cesarean Section - 1  Oral Surgery  Sentinel Lymph Node Biopsy  Tonsillectomy   Diagnostic Studies History  Colonoscopy  5-10 years ago Mammogram  within last year  Medication History  Medications Reconciled  Social History  Alcohol use  Occasional alcohol use. No caffeine use  No drug use  Tobacco use  Former smoker.  Family History  Breast Cancer  Sister. Cancer  Father. Cerebrovascular Accident  Family Members In General. Diabetes Mellitus  Family Members In General. Heart Disease  Family Members In General.  Pregnancy / Birth History Age at menarche  19 years. Age of menopause  1-55 Gravida  2 Irregular periods  Maternal age  40-30 Para  1  Other Problems Breast Cancer  Hypercholesterolemia  Lump In Breast  Other disease, cancer, significant illness     Review of Systems General Not Present- Appetite Loss, Chills, Fatigue, Fever, Night Sweats, Weight Gain and Weight Loss. Skin Not Present- Change in Wart/Mole, Dryness, Hives, Jaundice, New Lesions, Non-Healing Wounds, Rash and Ulcer. HEENT  Present- Visual Disturbances and Wears glasses/contact lenses. Not Present- Earache, Hearing Loss, Hoarseness, Nose Bleed, Oral Ulcers, Ringing in the Ears, Seasonal Allergies, Sinus Pain, Sore Throat and Yellow Eyes. Respiratory Not Present- Bloody sputum, Chronic Cough, Difficulty Breathing, Snoring and Wheezing. Breast Present- Breast Mass. Not Present- Breast Pain, Nipple Discharge and Skin Changes. Cardiovascular Not Present- Chest Pain, Difficulty Breathing Lying Down, Leg Cramps, Palpitations, Rapid Heart Rate, Shortness of Breath and Swelling of Extremities. Gastrointestinal Not Present- Abdominal Pain, Bloating, Bloody Stool, Change in Bowel Habits, Chronic diarrhea, Constipation, Difficulty Swallowing, Excessive gas, Gets full quickly at meals, Hemorrhoids, Indigestion, Nausea, Rectal Pain and Vomiting. Female Genitourinary Not Present- Frequency, Nocturia, Painful Urination, Pelvic Pain and Urgency. Musculoskeletal Not Present- Back Pain, Joint Pain, Joint Stiffness, Muscle Pain, Muscle Weakness and Swelling of Extremities. Neurological Not Present- Decreased Memory, Fainting, Headaches, Numbness, Seizures, Tingling, Tremor, Trouble walking and Weakness. Psychiatric Not Present- Anxiety, Bipolar, Change in Sleep Pattern, Depression, Fearful and Frequent crying. Endocrine Not Present- Cold Intolerance, Excessive Hunger, Hair Changes, Heat Intolerance, Hot flashes and New Diabetes. Hematology Not Present- Easy Bruising, Excessive bleeding, Gland problems, HIV and Persistent Infections.   Physical Exam  General Mental Status-Alert. General Appearance-Consistent with stated age. Hydration-Well hydrated. Voice-Normal.  Head and Neck Head-normocephalic, atraumatic with no lesions or palpable masses. Trachea-midline. Thyroid Gland Characteristics - normal size and consistency.  Eye Eyeball - Bilateral-Extraocular movements intact. Sclera/Conjunctiva - Bilateral-No  scleral icterus.  Chest and Lung Exam Chest and lung exam reveals -quiet, even and easy respiratory effort with no use of accessory muscles and on auscultation, normal breath  sounds, no adventitious sounds and normal vocal resonance. Inspection Chest Wall - Normal. Back - normal.  Breast Note: there is a palpable bruise in the upper outer left breast. There is no palpable mass in the right breast. There is no palpable axillary, supraclavicular, or cervical lymphadenopathy   Cardiovascular Cardiovascular examination reveals -normal heart sounds, regular rate and rhythm with no murmurs and normal pedal pulses bilaterally.  Abdomen Inspection Inspection of the abdomen reveals - No Hernias. Skin - Scar - no surgical scars. Palpation/Percussion Palpation and Percussion of the abdomen reveal - Soft, Non Tender, No Rebound tenderness, No Rigidity (guarding) and No hepatosplenomegaly. Auscultation Auscultation of the abdomen reveals - Bowel sounds normal.  Neurologic Neurologic evaluation reveals -alert and oriented x 3 with no impairment of recent or remote memory. Mental Status-Normal.  Musculoskeletal Normal Exam - Left-Upper Extremity Strength Normal and Lower Extremity Strength Normal. Normal Exam - Right-Upper Extremity Strength Normal and Lower Extremity Strength Normal.  Lymphatic Head & Neck  General Head & Neck Lymphatics: Bilateral - Description - Normal. Axillary  General Axillary Region: Bilateral - Description - Normal. Tenderness - Non Tender. Femoral & Inguinal  Generalized Femoral & Inguinal Lymphatics: Bilateral - Description - Normal. Tenderness - Non Tender.    Assessment & Plan  MALIGNANT NEOPLASM OF UPPER-OUTER QUADRANT OF LEFT BREAST IN FEMALE, ESTROGEN RECEPTOR POSITIVE (C50.412) Impression: the patient has a new left breast cancer with a history of radiation for a previous left-sided breast cancer. Because of this I would recommend  mastectomy. She is still a good candidate for sentinel node mapping since her axilla is clinically negative. She will also require a Port-A-Cath since she is HER-2 positive. I have discussed with her in detail the risks and benefits of the operation as well as some of the technical aspects and she understands and wishes to proceed Current Plans Pt Education - Breast cancer: discussed with patient and provided information.

## 2017-07-14 ENCOUNTER — Other Ambulatory Visit: Payer: Medicare HMO

## 2017-07-14 ENCOUNTER — Encounter (HOSPITAL_COMMUNITY): Payer: Self-pay | Admitting: General Surgery

## 2017-07-14 ENCOUNTER — Other Ambulatory Visit (HOSPITAL_COMMUNITY): Payer: Medicare HMO

## 2017-07-14 DIAGNOSIS — C50412 Malignant neoplasm of upper-outer quadrant of left female breast: Secondary | ICD-10-CM | POA: Diagnosis not present

## 2017-07-14 MED ORDER — HYDROCODONE-ACETAMINOPHEN 5-325 MG PO TABS: 1.0000 | ORAL_TABLET | ORAL | 0 refills | Status: DC | PRN

## 2017-07-14 MED ORDER — PANTOPRAZOLE SODIUM 40 MG PO TBEC: 40.0000 mg | DELAYED_RELEASE_TABLET | Freq: Every day | ORAL | Status: DC

## 2017-07-14 NOTE — Anesthesia Postprocedure Evaluation (Signed)
Anesthesia Post Note  Patient: Kathleen Hanson [facility]  Procedure(s) Performed: Procedure(s) (LRB): LEFT MASTECTOMY WITH SENTINEL NODE MAPPING ERAS PATHWAY (Left) INSERTION PORT-A-CATH (Right)     Patient location during evaluation: PACU Anesthesia Type: General and Regional Level of consciousness: awake and alert Pain management: pain level controlled Vital Signs Assessment: post-procedure vital signs reviewed and stable Respiratory status: spontaneous breathing, nonlabored ventilation, respiratory function stable and patient connected to nasal cannula oxygen Cardiovascular status: blood pressure returned to baseline and stable Postop Assessment: no apparent nausea or vomiting Anesthetic complications: no Comments: Patient complains tingling noted to palmar aspect of left thumb,index and middle fingers. Left arm 3/5 bicep/brachioradialis. Equal hand grips. Information reported to Dr Marlou Starks for post op monitoring. Dicussed with patient.     Last Vitals:  Vitals:   07/14/17 0559 07/14/17 1055  BP:  (!) 149/54  Pulse:  74  Resp:  18  Temp:  36.6 C  SpO2: 92% 95%    Last Pain:  Vitals:   07/14/17 1055  TempSrc: Oral  PainSc:                  Tawfiq Favila

## 2017-07-14 NOTE — Progress Notes (Signed)
1 Day Post-Op   Subjective/Chief Complaint: No complaints   Objective: Vital signs in last 24 hours: Temp:  [97.2 F (36.2 C)-98.3 F (36.8 C)] 97.8 F (36.6 C) (09/20 1055) Pulse Rate:  [74-114] 74 (09/20 1055) Resp:  [15-23] 18 (09/20 1055) BP: (134-160)/(53-89) 149/54 (09/20 1055) SpO2:  [86 %-95 %] 95 % (09/20 1055) Weight:  [59.9 kg (132 lb)] 59.9 kg (132 lb) (09/19 1700)    Intake/Output from previous day: 09/19 0701 - 09/20 0700 In: 1892.5 [P.O.:420; I.V.:1472.5] Out: 160 [Drains:130; Blood:30] Intake/Output this shift: No intake/output data recorded.  General appearance: alert and cooperative Resp: clear to auscultation bilaterally Chest wall: skin flaps look good. bruising around port Cardio: regular rate and rhythm GI: soft, non-tender; bowel sounds normal; no masses,  no organomegaly  Lab Results:   Recent Labs  07/12/17 1543  WBC 6.0  HGB 13.7  HCT 41.1  PLT 220   BMET  Recent Labs  07/12/17 1543  NA 135  K 4.0  CL 103  CO2 24  GLUCOSE 98  BUN 14  CREATININE 0.56  CALCIUM 8.9   PT/INR No results for input(s): LABPROT, INR in the last 72 hours. ABG No results for input(s): PHART, HCO3 in the last 72 hours.  Invalid input(s): PCO2, PO2  Studies/Results: Ct Head W & Wo Contrast  Result Date: 07/13/2017 CLINICAL DATA:  Left hand tingling after breast surgery EXAM: CT HEAD WITHOUT AND WITH CONTRAST TECHNIQUE: Contiguous axial images were obtained from the base of the skull through the vertex without and with intravenous contrast CONTRAST:  42mL ISOVUE-300 IOPAMIDOL (ISOVUE-300) INJECTION 61%, 74mL ISOVUE-300 IOPAMIDOL (ISOVUE-300) INJECTION 61% COMPARISON:  None. FINDINGS: Brain: No mass lesion, intraparenchymal hemorrhage or extra-axial collection. No evidence of acute cortical infarct. There is periventricular hypoattenuation compatible with chronic microvascular disease. There is bilateral basal ganglia mineralization. No abnormal contrast  enhancement. 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 Vascular: No hyperdense vessel or unexpected calcification. Skull: Normal visualized skull base, calvarium and extracranial soft tissues. Sinuses/Orbits: No sinus fluid levels or advanced mucosal thickening. No mastoid effusion. Normal orbits. IMPRESSION: No acute intracranial abnormality or evidence of metastatic disease. Electronically Signed   By: Ulyses Jarred M.D.   On: 07/13/2017 21:56   Dg Chest Port 1 View  Result Date: 07/13/2017 CLINICAL DATA:  Port-A-Cath placement EXAM: PORTABLE CHEST 1 VIEW COMPARISON:  None. FINDINGS: Right internal jugular Port-A-Cath terminates at the cavoatrial junction. Top-normal heart size. Aortic atherosclerosis. Otherwise normal mediastinal contour. No pneumothorax. No pleural effusion. No pulmonary edema. Mild left basilar atelectasis. Surgical clips overlie the left axilla and left chest wall. IMPRESSION: 1. No pneumothorax. Right internal jugular Port-A-Cath terminates at the cavoatrial junction. 2. Mild left basilar atelectasis. Electronically Signed   By: Ilona Sorrel M.D.   On: 07/13/2017 13:29   Dg Fluoro Guide Cv Line-no Report  Result Date: 07/13/2017 Fluoroscopy was utilized by the requesting physician.  No radiographic interpretation.    Anti-infectives: Anti-infectives    Start     Dose/Rate Route Frequency Ordered Stop   07/13/17 0845  ceFAZolin (ANCEF) 2-4 GM/100ML-% IVPB    Comments:  Tamsen Snider   : cabinet override      07/13/17 0845 07/13/17 1035   07/13/17 0838  ceFAZolin (ANCEF) IVPB 2g/100 mL premix     2 g 200 mL/hr over 30 Minutes Intravenous On call to O.R. 07/13/17 0838 07/13/17 1035      Assessment/Plan: s/p Procedure(s): LEFT MASTECTOMY WITH  SENTINEL NODE MAPPING ERAS PATHWAY (Left) INSERTION PORT-A-CATH (Right) Advance diet Discharge  LOS: 0 days    TOTH III,Demarques Pilz S 07/14/2017

## 2017-07-14 NOTE — Progress Notes (Signed)
Drain care and emptying was shown to the pt. Discharge instructions given to pt, verbalized understanding. Discharged to home accompanied by spouse.

## 2017-07-18 ENCOUNTER — Encounter (HOSPITAL_COMMUNITY): Payer: Self-pay

## 2017-07-18 ENCOUNTER — Ambulatory Visit (HOSPITAL_COMMUNITY)
Admission: RE | Admit: 2017-07-18 | Discharge: 2017-07-18 | Disposition: A | Discharge status: Home / Self Care | Payer: Medicare HMO | Source: Ambulatory Visit | Attending: General Surgery | Admitting: General Surgery

## 2017-07-18 ENCOUNTER — Inpatient Hospital Stay (HOSPITAL_COMMUNITY)
Admission: EM | Admit: 2017-07-18 | Discharge: 2017-07-27 | DRG: 201 | Disposition: A | Discharge status: Home / Self Care | Payer: Medicare HMO | Attending: General Surgery | Admitting: General Surgery

## 2017-07-18 ENCOUNTER — Other Ambulatory Visit (HOSPITAL_COMMUNITY): Payer: Self-pay | Admitting: General Surgery

## 2017-07-18 ENCOUNTER — Encounter (HOSPITAL_COMMUNITY): Payer: Self-pay | Admitting: Emergency Medicine

## 2017-07-18 ENCOUNTER — Inpatient Hospital Stay (HOSPITAL_COMMUNITY): Payer: Medicare HMO

## 2017-07-18 ENCOUNTER — Other Ambulatory Visit: Payer: Self-pay

## 2017-07-18 DIAGNOSIS — Z9012 Acquired absence of left breast and nipple: Secondary | ICD-10-CM

## 2017-07-18 DIAGNOSIS — Z923 Personal history of irradiation: Secondary | ICD-10-CM

## 2017-07-18 DIAGNOSIS — Z7982 Long term (current) use of aspirin: Secondary | ICD-10-CM

## 2017-07-18 DIAGNOSIS — Y838 Other surgical procedures as the cause of abnormal reaction of the patient, or of later complication, without mention of misadventure at the time of the procedure: Secondary | ICD-10-CM | POA: Diagnosis present

## 2017-07-18 DIAGNOSIS — Z803 Family history of malignant neoplasm of breast: Secondary | ICD-10-CM

## 2017-07-18 DIAGNOSIS — R918 Other nonspecific abnormal finding of lung field: Secondary | ICD-10-CM

## 2017-07-18 DIAGNOSIS — Z9221 Personal history of antineoplastic chemotherapy: Secondary | ICD-10-CM

## 2017-07-18 DIAGNOSIS — J9383 Other pneumothorax: Secondary | ICD-10-CM

## 2017-07-18 DIAGNOSIS — R0602 Shortness of breath: Secondary | ICD-10-CM | POA: Insufficient documentation

## 2017-07-18 DIAGNOSIS — Z853 Personal history of malignant neoplasm of breast: Secondary | ICD-10-CM

## 2017-07-18 DIAGNOSIS — J95811 Postprocedural pneumothorax: Principal | ICD-10-CM | POA: Diagnosis present

## 2017-07-18 DIAGNOSIS — Z4682 Encounter for fitting and adjustment of non-vascular catheter: Secondary | ICD-10-CM

## 2017-07-18 DIAGNOSIS — F329 Major depressive disorder, single episode, unspecified: Secondary | ICD-10-CM | POA: Diagnosis present

## 2017-07-18 DIAGNOSIS — Z9689 Presence of other specified functional implants: Secondary | ICD-10-CM

## 2017-07-18 DIAGNOSIS — Z79899 Other long term (current) drug therapy: Secondary | ICD-10-CM | POA: Diagnosis not present

## 2017-07-18 DIAGNOSIS — J939 Pneumothorax, unspecified: Secondary | ICD-10-CM

## 2017-07-18 DIAGNOSIS — Z8052 Family history of malignant neoplasm of bladder: Secondary | ICD-10-CM

## 2017-07-18 DIAGNOSIS — J9382 Other air leak: Secondary | ICD-10-CM | POA: Diagnosis not present

## 2017-07-18 DIAGNOSIS — Z87891 Personal history of nicotine dependence: Secondary | ICD-10-CM

## 2017-07-18 DIAGNOSIS — E785 Hyperlipidemia, unspecified: Secondary | ICD-10-CM | POA: Diagnosis present

## 2017-07-18 LAB — CBC
HEMATOCRIT: 40.5 % (ref 36.0–46.0)
Hemoglobin: 13.8 g/dL (ref 12.0–15.0)
MCH: 30.8 pg (ref 26.0–34.0)
MCHC: 34.1 g/dL (ref 30.0–36.0)
MCV: 90.4 fL (ref 78.0–100.0)
Platelets: 210 10*3/uL (ref 150–400)
RBC: 4.48 MIL/uL (ref 3.87–5.11)
RDW: 13.3 % (ref 11.5–15.5)
WBC: 9 10*3/uL (ref 4.0–10.5)

## 2017-07-18 MED ORDER — MORPHINE SULFATE (PF) 2 MG/ML IV SOLN: 1.0000 mg | INTRAVENOUS | Status: DC | PRN

## 2017-07-18 MED ORDER — HYDRALAZINE HCL 20 MG/ML IJ SOLN
10.0000 mg | INTRAMUSCULAR | Status: DC | PRN
  Filled 2017-07-18: qty 1

## 2017-07-18 MED ORDER — DIPHENHYDRAMINE HCL 12.5 MG/5ML PO ELIX: 12.5000 mg | ORAL_SOLUTION | Freq: Four times a day (QID) | ORAL | Status: DC | PRN

## 2017-07-18 MED ORDER — PRAVASTATIN SODIUM 20 MG PO TABS
40.0000 mg | ORAL_TABLET | Freq: Every day | ORAL | Status: DC
  Administered 2017-07-19 – 2017-07-27 (×9): 40 mg via ORAL
  Filled 2017-07-18 (×9): qty 2

## 2017-07-18 MED ORDER — ZOLPIDEM TARTRATE 5 MG PO TABS: 5.0000 mg | ORAL_TABLET | Freq: Every evening | ORAL | Status: DC | PRN

## 2017-07-18 MED ORDER — ONDANSETRON HCL 4 MG/2ML IJ SOLN: 4.0000 mg | Freq: Four times a day (QID) | INTRAMUSCULAR | Status: DC | PRN

## 2017-07-18 MED ORDER — DIPHENHYDRAMINE HCL 50 MG/ML IJ SOLN: 12.5000 mg | Freq: Four times a day (QID) | INTRAMUSCULAR | Status: DC | PRN

## 2017-07-18 MED ORDER — TRAMADOL HCL 50 MG PO TABS
50.0000 mg | ORAL_TABLET | Freq: Four times a day (QID) | ORAL | Status: DC | PRN
  Filled 2017-07-18: qty 1

## 2017-07-18 MED ORDER — ACETAMINOPHEN 325 MG PO TABS
650.0000 mg | ORAL_TABLET | Freq: Four times a day (QID) | ORAL | Status: DC | PRN
  Administered 2017-07-19 – 2017-07-20 (×5): 650 mg via ORAL
  Filled 2017-07-18 (×5): qty 2

## 2017-07-18 MED ORDER — ACETAMINOPHEN 650 MG RE SUPP: 650.0000 mg | Freq: Four times a day (QID) | RECTAL | Status: DC | PRN

## 2017-07-18 MED ORDER — SIMETHICONE 80 MG PO CHEW: 40.0000 mg | CHEWABLE_TABLET | Freq: Four times a day (QID) | ORAL | Status: DC | PRN

## 2017-07-18 MED ORDER — METHOCARBAMOL 500 MG PO TABS: 500.0000 mg | ORAL_TABLET | Freq: Four times a day (QID) | ORAL | Status: DC | PRN

## 2017-07-18 MED ORDER — HYDROCODONE-ACETAMINOPHEN 5-325 MG PO TABS: 1.0000 | ORAL_TABLET | ORAL | Status: DC | PRN

## 2017-07-18 MED ORDER — IOPAMIDOL (ISOVUE-370) INJECTION 76%
100.0000 mL | Freq: Once | INTRAVENOUS | Status: AC | PRN
Start: 2017-07-18 — End: 2017-07-18
  Administered 2017-07-18: 100 mL via INTRAVENOUS

## 2017-07-18 MED ORDER — LIDOCAINE-EPINEPHRINE (PF) 2 %-1:200000 IJ SOLN
30.0000 mL | Freq: Once | INTRAMUSCULAR | Status: AC
  Administered 2017-07-18: 20 mL via INTRADERMAL
  Filled 2017-07-18: qty 40

## 2017-07-18 MED ORDER — FENTANYL CITRATE (PF) 100 MCG/2ML IJ SOLN
50.0000 ug | Freq: Once | INTRAMUSCULAR | Status: AC
  Administered 2017-07-18: 50 ug via INTRAVENOUS
  Filled 2017-07-18: qty 2

## 2017-07-18 MED ORDER — IOPAMIDOL (ISOVUE-370) INJECTION 76%
INTRAVENOUS | Status: AC
  Filled 2017-07-18: qty 100

## 2017-07-18 MED ORDER — POLYETHYLENE GLYCOL 3350 17 G PO PACK
17.0000 g | PACK | Freq: Every day | ORAL | Status: DC | PRN
  Administered 2017-07-25 – 2017-07-26 (×2): 17 g via ORAL
  Filled 2017-07-18 (×2): qty 1

## 2017-07-18 MED ORDER — ONDANSETRON 4 MG PO TBDP: 4.0000 mg | ORAL_TABLET | Freq: Four times a day (QID) | ORAL | Status: DC | PRN

## 2017-07-18 MED ORDER — BISACODYL 5 MG PO TBEC: 5.0000 mg | DELAYED_RELEASE_TABLET | Freq: Every day | ORAL | Status: DC | PRN

## 2017-07-18 NOTE — Progress Notes (Signed)
Notified patient arrived to 5W 1537 with chest tube in place.  Connected to wall with assist of ED nurse.  Notified MD. For orders for chest tube parameter.  She said not necessary to transfer to tele monitor.

## 2017-07-18 NOTE — ED Triage Notes (Addendum)
Pt coming from home with complaints of shortness of breath. Pt has Hx of breast cancer and recently had biopsy on left side. Pt found to have large right sided pneumothorax by CT scan. Pt A&O x4.

## 2017-07-18 NOTE — ED Provider Notes (Addendum)
[facility] DEPT Provider Note   CSN: [id number] Arrival date & time: 07/18/17  1930     History   Chief Complaint Chief Complaint  Patient presents with  . Collapsed lung  . Shortness of Breath    HPI Kathleen Hanson is a 81 y.o. female.  HPI   81 year old female with past medical history of recent right internal jugular port and left mastectomy for breast cancer here with shortness of breath and pneumothorax. The patient was scheduled for an outpatient CT scan today due to persistent shortness of breath since her surgery. CT scan showed a large right pneumothorax with compression of the mediastinum concerning for developing tension. Patient states that she has been significantly increasingly short of breath since surgery. She feels like she has had some left-sided chest pain at her mastectomy site, but has not had any cough. She's not had any hemoptysis. She states that she notified her surgeon who scheduled the CT scan today. She has no history of pneumothorax. Per review of operative notes, the patient did have a chest x-ray after placement of her port and there was no pneumothorax.  Past Medical History:  Diagnosis Date  . Cancer of left breast ([place])   . Constipation   . Fibrocystic breast   . Hyperlipidemia   . Ocular migraine    "used to have them monthly for a period of time; seemed to stop; now have had a couple in the last week" (07/13/2017)    Patient Active Problem List   Diagnosis Date Noted  . Pneumothorax on right 07/18/2017  . Breast cancer of upper-outer quadrant of left female breast ([place]) 07/13/2017  . Malignant neoplasm of upper-outer quadrant of left breast in female, estrogen receptor positive ([place]) 06/30/2017    Past Surgical History:  Procedure Laterality Date  . BREAST BIOPSY Right ~ 2001   BREAST EXCISIONAL BIOPSY  . BREAST LUMPECTOMY Left 2001   chemo and radiation  . CESAREAN SECTION  X 1  . MASTECTOMY COMPLETE / SIMPLE W/ SENTINEL NODE  BIOPSY Left 07/13/2017    LEFT MASTECTOMY WITH SENTINEL NODE MAPPING ERAS PATHWAY [name] 07/13/2017  . MASTECTOMY W/ SENTINEL NODE BIOPSY Left 07/13/2017   Procedure: LEFT MASTECTOMY WITH SENTINEL NODE MAPPING ERAS PATHWAY;  Surgeon: [name], MD;  Location: [facility];  Service: General;  Laterality: Left;  . PORTACATH PLACEMENT Right 07/13/2017  . PORTACATH PLACEMENT Right 07/13/2017   Procedure: INSERTION PORT-A-CATH;  Surgeon: [name], MD;  Location: [facility];  Service: General;  Laterality: Right;  . TONSILLECTOMY      OB History    No data available       Home Medications    Prior to Admission medications   Medication Sig Start Date End Date Taking? Authorizing Provider  aspirin 81 MG chewable tablet Chew 81 mg by mouth daily.    Yes [provider]  cholecalciferol (VITAMIN D) 1000 units tablet Take 1,000 Units by mouth daily.   Yes [provider]  HYDROcodone-acetaminophen (NORCO/VICODIN) 5-325 MG tablet Take 1-2 tablets by mouth every 4 (four) hours as needed for moderate pain. 07/14/17  Yes [name], MD  Multiple Vitamin (MULTIVITAMIN WITH MINERALS) TABS tablet Take 1 tablet by mouth daily.   Yes [provider]  polyethylene glycol powder (GLYCOLAX/MIRALAX) powder MIX 17 GRAMS IN LIQUID AND DRINK DAILY IN THE EVENING 03/26/16  Yes [provider]  pravastatin (PRAVACHOL) 40 MG tablet Take 40 mg by mouth daily.   Yes  [provider]  vitamin C (ASCORBIC ACID) 500 MG tablet Take 500 mg by mouth daily.   Yes [provider]    Family History Family History  Problem Relation Age of Onset  . Cancer Father        bladder cancer  . Breast cancer Sister 71    Social History Social History  Substance Use Topics  . Smoking status: Former Smoker    Packs/day: 0.50    Years: 15.00    Types: Cigarettes    Quit date: 1976  . Smokeless tobacco: Never Used  . Alcohol use 6.0 oz/week    10 Glasses of wine per week      Allergies   Patient has no known allergies.   Review of Systems Review of Systems  Constitutional: Positive for fatigue. Negative for chills and fever.  HENT: Negative for congestion, rhinorrhea and sore throat.   Eyes: Negative for visual disturbance.  Respiratory: Positive for cough and shortness of breath. Negative for wheezing.   Cardiovascular: Negative for chest pain and leg swelling.  Gastrointestinal: Negative for abdominal pain, diarrhea, nausea and vomiting.  Genitourinary: Negative for dysuria, flank pain, vaginal bleeding and vaginal discharge.  Musculoskeletal: Negative for neck pain.  Skin: Negative for rash.  Allergic/Immunologic: Negative for immunocompromised state.  Neurological: Negative for syncope and headaches.  Hematological: Does not bruise/bleed easily.  All other systems reviewed and are negative.    Physical Exam Updated Vital Signs BP (!) 188/72   Pulse 73   Temp 99.1 F (37.3 C) (Oral)   Resp 18   Ht 5\' 4"  (1.626 m)   Wt 59 kg (130 lb)   SpO2 100%   BMI 22.31 kg/m   Physical Exam  Constitutional: She is oriented to person, place, and time. She appears well-developed and well-nourished. No distress.  HENT:  Head: Normocephalic and atraumatic.  Eyes: Conjunctivae are normal.  Neck: Neck supple.  Cardiovascular: Normal rate, regular rhythm and normal heart sounds.  Exam reveals no friction rub.   No murmur heard. Pulmonary/Chest: Effort normal. No respiratory distress. She has no wheezes. She has no rales.  Bruising over right upper chest/subclavian area, as well as left chest site s/p mastectomy. Operative sites appear well approximated without induration or fluctuance.  Markedly diminished breath sounds on the right.  Abdominal: Soft. She exhibits no distension.  Musculoskeletal: She exhibits no edema.  Neurological: She is alert and oriented to person, place, and time. She exhibits normal muscle tone.  Skin: Skin is warm.  Capillary refill takes less than 2 seconds.  Psychiatric: She has a normal mood and affect.  Nursing note and vitals reviewed.    ED Treatments / Results  Labs (all labs ordered are listed, but only abnormal results are displayed) Labs Reviewed - No data to display  EKG  EKG Interpretation None       Radiology Ct Angio Chest Pe W Or Wo Contrast  Result Date: 07/18/2017 CLINICAL DATA:  Shortness of breath with chest pressure. Recent left mastectomy for breast cancer. Former smoker. EXAM: CT ANGIOGRAPHY CHEST WITH CONTRAST TECHNIQUE: Multidetector CT imaging of the chest was performed using the standard protocol during bolus administration of intravenous contrast. Multiplanar CT image reconstructions and MIPs were obtained to evaluate the vascular anatomy. CONTRAST:  100 cc Isovue-300 COMPARISON:  None. FINDINGS: Cardiovascular: There is no pulmonary embolism identified within the main, lobar or segmental pulmonary arteries bilaterally. Thoracic aorta is normal in caliber and configuration. No evidence of aortic dissection.  Mediastinum/Nodes: Trace pericardial effusion versus, more likely, anterior pericardial wall thickening related to previous radiation therapy. No mass or enlarged lymph nodes within the mediastinum or perihilar regions. Esophagus appears normal. Lungs/Pleura: Large right-sided pneumothorax. At least mild shift of the mediastinal structures towards the left hemithorax. Associated airspace collapse of the right lung posteriorly. Left lung is clear. Upper Abdomen: No acute findings. Musculoskeletal: Mild degenerative spurring and ankylosis throughout the slightly kyphotic and scoliotic thoracolumbar spine. No acute or suspicious osseous finding. Left chest wall drain status post mastectomy. Right IJ Port-A-Cath in place. Review of the MIP images confirms the above findings. IMPRESSION: 1. Very large RIGHT-SIDED pneumothorax. At least mild associated leftward shift of the  mediastinal structures. Associated airspace collapse of the right lung posteriorly. 2. No pulmonary embolism. 3. Probable anterior pericardial wall thickening related to previous radiation therapy, less likely trace pericardial effusion. 4. No aortic aneurysm or aortic dissection. 5. Heart size is normal. Critical Value/emergent results were called by telephone at the time of interpretation on 07/18/2017 at 7:05 pm to Dr. Barry Dienes , who verbally acknowledged these results. At Dr. Marlowe Aschoff request, patient was sent to the emergency room at [facility]. Electronically Signed   By: Franki Cabot M.D.   On: 07/18/2017 19:12   Dg Chest Portable 1 View  Result Date: 07/18/2017 CLINICAL DATA:  Shortness of breath EXAM: PORTABLE CHEST 1 VIEW COMPARISON:  CT 07/18/2017, radiograph 07/13/2017 FINDINGS: Interim placement of a right upper lateral chest tube. Small residual right apical pneumothorax, demonstrating about 12 mm of pleural-parenchymal separation. Right-sided central venous port tip overlies the distal SVC. Probable tiny right effusion and minimal atelectasis at the right base. Stable cardiomediastinal silhouette. Postsurgical changes of the left axilla and breast. IMPRESSION: 1. Placement of a right-sided chest tube with the pigtail visualized over the upper lateral aspect of the right thorax. Small residual right apical pneumothorax. 2. Tiny right effusion and minimal bibasilar atelectasis Electronically Signed   By: Donavan Foil M.D.   On: 07/18/2017 21:19    Procedures CHEST TUBE INSERTION Date/Time: 07/18/2017 10:44 PM Performed by: Duffy Bruce Authorized by: Duffy Bruce   Consent:    Consent obtained:  Written and verbal   Consent given by:  Patient   Risks discussed:  Bleeding, damage to surrounding structures, infection, incomplete drainage, nerve damage and pain   Alternatives discussed:  Alternative treatment Pre-procedure details:    Skin preparation:  ChloraPrep    Preparation: Patient was prepped and draped in the usual sterile fashion   Anesthesia (see MAR for exact dosages):    Anesthesia method:  Local infiltration   Local anesthetic:  Lidocaine 1% WITH epi Procedure details:    Placement location:  R lateral   Scalpel size:  11   Tube size (Fr):  8   Tension pneumothorax: no     Tube connected to:  Suction   Drainage characteristics:  Air only   Suture material:  0 silk Post-procedure details:    Post-insertion x-ray findings: tube in good position     Patient tolerance of procedure:  Tolerated well, no immediate complications Comments:     Percutaneous pigtail catheter placed by myself. Patient tolerated very well. Her right breast tissue was displaced manually by a nurse during insertion, but due to skin laxity and tissue laxity, placement made slightly difficult due to redundant breast tissue.  The chest cavity was entered atraumatically over the rib. There is no bleeding. Following insertion, there was an audible rush of air  from the catheter. Catheter dressed with sterile dressing, sutured in place.  .Critical Care Performed by: Duffy Bruce Authorized by: Duffy Bruce   Critical care provider statement:    Critical care time (minutes):  35   Critical care time was exclusive of:  Separately billable procedures and treating other patients and teaching time   Critical care was necessary to treat or prevent imminent or life-threatening deterioration of the following conditions:  Respiratory failure and shock   Critical care was time spent personally by me on the following activities:  Development of treatment plan with patient or surrogate, discussions with consultants, evaluation of patient's response to treatment, examination of patient, obtaining history from patient or surrogate, ordering and performing treatments and interventions, ordering and review of laboratory studies, ordering and review of radiographic studies, pulse  oximetry, re-evaluation of patient's condition and review of old charts   I assumed direction of critical care for this patient from another provider in my specialty: no     (including critical care time)  Medications Ordered in ED Medications  lidocaine-EPINEPHrine (XYLOCAINE W/EPI) 2 %-1:200000 (PF) injection 30 mL (20 mLs Intradermal Given by Other 07/18/17 2019)  fentaNYL (SUBLIMAZE) injection 50 mcg (50 mcg Intravenous Given 07/18/17 2002)     Initial Impression / Assessment and Plan / ED Course  I have reviewed the triage vital signs and the nursing notes.  Pertinent labs & imaging results that were available during my care of the patient were reviewed by me and considered in my medical decision making (see chart for details).     81 yo F here with large R PTX s/p recent port placement and mastectomy. CXR after port placement in OR showed no PTX. Pt HDS on arrival without apparent tension physiology. Pt draped, prepped and pigtail catheter placed as above, with excellent return of air and post-insertion CXR showing interval improvement in PTX. Pt HDS. Dr. Barry Dienes at bedside on arrival to see pt, will admit for monitoring. Pt otherwise very well appearing and in NAD.  Chest monitored after insertion - no bruising around chest tube. Pt tolerated well. No hematomas.  Final Clinical Impressions(s) / ED Diagnoses   Final diagnoses:  Spontaneous pneumothorax      Duffy Bruce, MD 07/18/17 Octavia Heir    Duffy Bruce, MD 07/18/17 2248    Duffy Bruce, MD 07/19/17 [phone]

## 2017-07-18 NOTE — H&P (Signed)
Kathleen Hanson is an 81 y.o. female.   Chief Complaint: shortness of breath HPI:  Pt is a lovely 81 yo F pt of Dr. Ethlyn Gallery who underwent left mastectomy/SLN bx/R IJ port placement 07/13/2017.  Post op CXR was OK.  She was kept overnight and discharged on 9/20.  She called today with several days of significant shortness of breath.  Given cancer history and recent surgery. CTA to eval for PE was performed.  On [facility] imaging, right PTX was seen.  The patient was taken from [place] to [facility] long ED.  She was still a little short of breath, but not in distress.  She was able to converse without tachypnea.  She denies any significant surgical pain, just a little left axillary soreness and some soreness when she pressed on the left upper chest.    Past Medical History:  Diagnosis Date  . Cancer of left breast ([place])   . Constipation   . Fibrocystic breast   . Hyperlipidemia   . Ocular migraine    "used to have them monthly for a period of time; seemed to stop; now have had a couple in the last week" (07/13/2017)    Past Surgical History:  Procedure Laterality Date  . BREAST BIOPSY Right ~ 2001   BREAST EXCISIONAL BIOPSY  . BREAST LUMPECTOMY Left 2001   chemo and radiation  . CESAREAN SECTION  X 1  . MASTECTOMY COMPLETE / SIMPLE W/ SENTINEL NODE BIOPSY Left 07/13/2017    LEFT MASTECTOMY WITH SENTINEL NODE MAPPING ERAS PATHWAY [name] 07/13/2017  . MASTECTOMY W/ SENTINEL NODE BIOPSY Left 07/13/2017   Procedure: LEFT MASTECTOMY WITH SENTINEL NODE MAPPING ERAS PATHWAY;  Surgeon: [name], MD;  Location: [facility];  Service: General;  Laterality: Left;  . PORTACATH PLACEMENT Right 07/13/2017  . PORTACATH PLACEMENT Right 07/13/2017   Procedure: INSERTION PORT-A-CATH;  Surgeon: [name], MD;  Location: [facility];  Service: General;  Laterality: Right;  . TONSILLECTOMY      Family History  Problem Relation Age of Onset  . Cancer Father        bladder cancer  . Breast cancer Sister 41    Social History:  reports that she quit smoking about 42 years ago. Her smoking use included Cigarettes. She has a 7.50 pack-year smoking history. She has never used smokeless tobacco. She reports that she drinks about 6.0 oz of alcohol per week . She reports that she does not use drugs.  Allergies: No Known Allergies   (Not in a hospital admission)  No results found for this or any previous visit (from the past 48 hour(s)). Ct Angio Chest Pe W Or Wo Contrast  Result Date: 07/18/2017 CLINICAL DATA:  Shortness of breath with chest pressure. Recent left mastectomy for breast cancer. Former smoker. EXAM: CT ANGIOGRAPHY CHEST WITH CONTRAST TECHNIQUE: Multidetector CT imaging of the chest was performed using the standard protocol during bolus administration of intravenous contrast. Multiplanar CT image reconstructions and MIPs were obtained to evaluate the vascular anatomy. CONTRAST:  100 cc Isovue-300 COMPARISON:  None. FINDINGS: Cardiovascular: There is no pulmonary embolism identified within the main, lobar or segmental pulmonary arteries bilaterally. Thoracic aorta is normal in caliber and configuration. No evidence of aortic dissection. Mediastinum/Nodes: Trace pericardial effusion versus, more likely, anterior pericardial wall thickening related to previous radiation therapy. No mass or enlarged lymph nodes within the mediastinum or perihilar regions. Esophagus appears normal. Lungs/Pleura: Large right-sided pneumothorax. At least mild shift of the  mediastinal structures towards the left hemithorax. Associated airspace collapse of the right lung posteriorly. Left lung is clear. Upper Abdomen: No acute findings. Musculoskeletal: Mild degenerative spurring and ankylosis throughout the slightly kyphotic and scoliotic thoracolumbar spine. No acute or suspicious osseous finding. Left chest wall drain status post mastectomy. Right IJ Port-A-Cath in place. Review of the MIP images confirms the above findings.  IMPRESSION: 1. Very large RIGHT-SIDED pneumothorax. At least mild associated leftward shift of the mediastinal structures. Associated airspace collapse of the right lung posteriorly. 2. No pulmonary embolism. 3. Probable anterior pericardial wall thickening related to previous radiation therapy, less likely trace pericardial effusion. 4. No aortic aneurysm or aortic dissection. 5. Heart size is normal. Critical Value/emergent results were called by telephone at the time of interpretation on 07/18/2017 at 7:05 pm to Dr. Barry Dienes , who verbally acknowledged these results. At Dr. Marlowe Aschoff request, patient was sent to the emergency room at [facility]. Electronically Signed   By: Franki Cabot M.D.   On: 07/18/2017 19:12    Review of Systems  Respiratory: Positive for shortness of breath.   All other systems reviewed and are negative.   Blood pressure (!) 166/81, pulse 80, resp. rate (!) 26, height 5\' 4"  (1.626 m), weight 59 kg (130 lb), SpO2 91 %. Physical Exam  Constitutional: She is oriented to person, place, and time. She appears well-developed and well-nourished. No distress.  HENT:  Head: Normocephalic and atraumatic.  Right Ear: External ear normal.  Left Ear: External ear normal.  Mouth/Throat: Oropharynx is clear and moist.  Eyes: Pupils are equal, round, and reactive to light. Conjunctivae are normal. Right eye exhibits no discharge. Left eye exhibits no discharge. No scleral icterus.  Neck: Normal range of motion. Neck supple. No tracheal deviation present. No thyromegaly present.  Cardiovascular: Normal rate, regular rhythm, normal heart sounds and intact distal pulses.  Exam reveals no gallop and no friction rub.   No murmur heard. Respiratory: Effort normal. No respiratory distress. She has no wheezes. She has no rales. She exhibits no tenderness.  Decreased breath sounds on right.    Bruising at port site and on left chest.    GI: Soft. She exhibits no distension. There is no  tenderness.  Musculoskeletal: Normal range of motion.  Lymphadenopathy:    She has no cervical adenopathy.  Neurological: She is alert and oriented to person, place, and time. Coordination normal.  Skin: Skin is warm and dry. No rash noted. She is not diaphoretic. No erythema. No pallor.  Psychiatric: She has a normal mood and affect. Her behavior is normal. Judgment and thought content normal.     Assessment/Plan R PTX  Pt getting pigtail chest tube from ED Physician.  Will be placed to pleurovac at -20 cm H2O suction.   Repeat CXR in AM.  Dr. Marlou Starks aware and will see in AM.   Regular diet. PRN vicodin/tylenol/tramadol for pain. Saline lock IVF. Will hold ASA and enoxaparin tonight given bruising on chest.  Can potentially start tomorrow.    Stark Klein, MD 07/18/2017, 8:47 PM

## 2017-07-18 NOTE — ED Notes (Signed)
Bed: RESB Expected date:  Expected time:  Means of arrival:  Comments: From CT-+pneumothorax

## 2017-07-18 NOTE — Progress Notes (Signed)
Notified Rapid response nurse to assess patient.  On assessment she said that patient appears ok,She  does not see any leaks.  Patient does have fluctuation in  tubing when she inhales deeply.

## 2017-07-19 ENCOUNTER — Inpatient Hospital Stay (HOSPITAL_COMMUNITY): Payer: Medicare HMO

## 2017-07-19 ENCOUNTER — Telehealth: Payer: Self-pay | Admitting: Hematology

## 2017-07-19 ENCOUNTER — Encounter (HOSPITAL_COMMUNITY): Payer: Self-pay | Admitting: *Deleted

## 2017-07-19 LAB — BASIC METABOLIC PANEL
Anion gap: 11 (ref 5–15)
BUN: 12 mg/dL (ref 6–20)
CALCIUM: 8.7 mg/dL — AB (ref 8.9–10.3)
CHLORIDE: 102 mmol/L (ref 101–111)
CO2: 23 mmol/L (ref 22–32)
CREATININE: 0.41 mg/dL — AB (ref 0.44–1.00)
GFR calc non Af Amer: >60 mL/min (ref >60)
Glucose, Bld: 96 mg/dL (ref 65–99)
Potassium: 3.8 mmol/L (ref 3.5–5.1)
SODIUM: 136 mmol/L (ref 135–145)

## 2017-07-19 NOTE — Telephone Encounter (Signed)
Left message for patient regarding appts that were added per 9/17 sch msg

## 2017-07-19 NOTE — Progress Notes (Signed)
Left Mastectomy with SLN bx Right IJ port placement 07/13/2017,  Went home on 9/20. Had SOB  And came back to ED. Xray shows Pneumothorax.  Chest tube placed on 9/24.  Transferred to 5W  1537 and on 20cm neg pressure and 20cc wall suction .  On continuous pulse ox.

## 2017-07-19 NOTE — Progress Notes (Signed)
Rapid Response Event Note  Overview:   RRT called at 2250 and requested to look at pt chest tube. RN stated that chest tube was having audible air leak, but when she went in to reassess later, the chest tube was no longer leaking.  Initial Focused Assessment: Pt oxygen saturation was 98% on 2LNC. Pt appears in no distress, complaining of some pain on her left chest, that decreased with repositioning. I assessed connections and tubing for kinks, none noted. Chest tube appeared to be correctly set up and connections were checked and tightened. Pt air-leak detector was not bubbling, and only bubbled when pt took in a large inhale.    Interventions: I addressed the taping at the chest tube connection site to [place]. I advised RN to continue to monitor pt respiratory distress or decreased oxygen saturation. I also advised RN to notify MD regarding air leak changing from continuous air leak to air leak only when pt takes in a large inhale. RN to notify RRT for further questions or concerns.    Plan of Care (if not transferred): I advised RN to continue to monitor pt respiratory distress or decreased oxygen saturation. I also advised RN to notify MD regarding air leak changing from continuous air leak to air leak only when pt takes in a large inhale. RN to notify RRT for further questions or concerns. I also demonstrated reviewing Clinical Skills for trouble shooting chest tube.   Event Summary:  RRT called at 2250, RRT arrived to room 1537 at 2255. RRT assessed  pt, see details above.   Casimer Bilis

## 2017-07-20 ENCOUNTER — Inpatient Hospital Stay (HOSPITAL_COMMUNITY): Payer: Medicare HMO

## 2017-07-20 ENCOUNTER — Encounter (HOSPITAL_COMMUNITY): Payer: Self-pay | Admitting: Interventional Radiology

## 2017-07-20 HISTORY — PX: IR CATHETER TUBE CHANGE: IMG717

## 2017-07-20 MED ORDER — MIDAZOLAM HCL 2 MG/2ML IJ SOLN
INTRAMUSCULAR | Status: AC
  Filled 2017-07-20: qty 4

## 2017-07-20 MED ORDER — FENTANYL CITRATE (PF) 100 MCG/2ML IJ SOLN
INTRAMUSCULAR | Status: AC | PRN
  Administered 2017-07-20: 50 ug via INTRAVENOUS

## 2017-07-20 MED ORDER — MIDAZOLAM HCL 2 MG/2ML IJ SOLN
INTRAMUSCULAR | Status: AC | PRN
  Administered 2017-07-20: 1 mg via INTRAVENOUS

## 2017-07-20 MED ORDER — FENTANYL CITRATE (PF) 100 MCG/2ML IJ SOLN
INTRAMUSCULAR | Status: AC
  Filled 2017-07-20: qty 4

## 2017-07-20 MED ORDER — LIDOCAINE-EPINEPHRINE (PF) 2 %-1:200000 IJ SOLN
INTRAMUSCULAR | Status: AC
  Filled 2017-07-20: qty 20

## 2017-07-20 NOTE — Procedures (Signed)
Interventional Radiology Procedure Note  Procedure: Successful repositioning and replacement of the right sided chest tube with a new 66F tube.  The tube is now positioned at the lung apex and there is complete re-inflation of the lung.   Complications: None  Estimated Blood Loss: None  Recommendations: - Resume wall suction  Signed,  Criselda Peaches, MD

## 2017-07-20 NOTE — Sedation Documentation (Signed)
Patient denies pain and is resting comfortably.  

## 2017-07-20 NOTE — Consult Note (Signed)
Chief Complaint: Patient was seen in consultation today for right chest tube manipulation/repositioning or possible new placement Chief Complaint  Patient presents with  . Collapsed lung  . Shortness of Breath    Referring Physician(s): Toth,P  Supervising Physician: Jacqulynn Cadet  Patient Status: [facility] - In-pt  History of Present Illness: Kathleen Hanson is a 81 y.o. female with history of left breast cancer, status post left mastectomy with right chest wall Port-A-Cath placement on 07/13/17 by [facility]. Chest x-ray post Port-A-Cath placement revealed no pneumothorax. Patient presented to ED on 9/24 with progressive dyspnea and follow-up imaging revealed large pneumothorax. A smallbore chest tube was placed by ED staff. Chest x-ray today revealed interval increase in volume of right-sided pneumothorax with chest tube appearing to have been retracted somewhat. Request now received from surgery for right chest tube manipulation/repositioning or possible new placement if necessary.  Past Medical History:  Diagnosis Date  . Cancer of left breast ([place])   . Constipation   . Fibrocystic breast   . Hyperlipidemia   . Ocular migraine    "used to have them monthly for a period of time; seemed to stop; now have had a couple in the last week" (07/13/2017)    Past Surgical History:  Procedure Laterality Date  . BREAST BIOPSY Right ~ 2001   BREAST EXCISIONAL BIOPSY  . BREAST LUMPECTOMY Left 2001   chemo and radiation  . CESAREAN SECTION  X 1  . MASTECTOMY COMPLETE / SIMPLE W/ SENTINEL NODE BIOPSY Left 07/13/2017    LEFT MASTECTOMY WITH SENTINEL NODE MAPPING ERAS PATHWAY [name] 07/13/2017  . MASTECTOMY W/ SENTINEL NODE BIOPSY Left 07/13/2017   Procedure: LEFT MASTECTOMY WITH SENTINEL NODE MAPPING ERAS PATHWAY;  Surgeon: [name], MD;  Location: [place];  Service: General;  Laterality: Left;  . PORTACATH PLACEMENT Right 07/13/2017  . PORTACATH PLACEMENT Right 07/13/2017   Procedure:  INSERTION PORT-A-CATH;  Surgeon: [name], MD;  Location: [place];  Service: General;  Laterality: Right;  . TONSILLECTOMY      Allergies: Patient has no known allergies.  Medications: Prior to Admission medications   Medication Sig Start Date End Date Taking? Authorizing Provider  aspirin 81 MG chewable tablet Chew 81 mg by mouth daily.    Yes [provider]  cholecalciferol (VITAMIN D) 1000 units tablet Take 1,000 Units by mouth daily.   Yes [provider]  HYDROcodone-acetaminophen (NORCO/VICODIN) 5-325 MG tablet Take 1-2 tablets by mouth every 4 (four) hours as needed for moderate pain. 07/14/17  Yes [name], MD  Multiple Vitamin (MULTIVITAMIN WITH MINERALS) TABS tablet Take 1 tablet by mouth daily.   Yes [provider]  polyethylene glycol powder (GLYCOLAX/MIRALAX) powder MIX 17 GRAMS IN LIQUID AND DRINK DAILY IN THE EVENING 03/26/16  Yes [provider]  pravastatin (PRAVACHOL) 40 MG tablet Take 40 mg by mouth daily.   Yes [provider]  vitamin C (ASCORBIC ACID) 500 MG tablet Take 500 mg by mouth daily.   Yes [provider]     Family History  Problem Relation Age of Onset  . Cancer Father        bladder cancer  . Breast cancer Sister 70    Social History   Social History  . Marital status: Married    Spouse name: N/A  . Number of children: N/A  . Years of education: N/A   Social History Main Topics  . Smoking status: Former Smoker    Packs/day:  0.50    Years: 15.00    Types: Cigarettes    Quit date: 43  . Smokeless tobacco: Never Used  . Alcohol use 6.0 oz/week    10 Glasses of wine per week  . Drug use: No  . Sexual activity: Not Currently   Other Topics Concern  . None   Social History Narrative  . None      Review of Systems currently denies fever, headache, worsening chest pain, cough, abdominal/back pain, nausea, vomiting or bleeding. She does have some mild dyspnea.  Vital  Signs: BP (!) 183/77 (BP Location: Right Leg)   Pulse 80   Temp 98.3 F (36.8 C) (Oral)   Resp 20   Ht _0  (1.626 m)   Wt 130 lb (59 kg)   SpO2 96%   BMI 22.31 kg/m   Physical Exam awake, alert. Chest with slightly diminished breath sounds right upper field, left clear. Right lateral chest tube intact, attached to Pleur-evac . Airleak noted ; right upper chest Port-A-Cath with ecchymosis, mildly tender to palpation. Heart with regular rate and rhythm. Abdomen soft, positive bowel sounds, nontender. Extremities with full range of motion.  Imaging: Dg Chest 2 View  Result Date: 07/20/2017 CLINICAL DATA:  Followup pneumothorax. EXAM: CHEST  2 VIEW COMPARISON:  07/19/2017 FINDINGS: There is a right chest wall port a catheter with tip in the SVC. There is also a right-sided pigtail catheter within the left chest. This appears under advanced as a portion of the pigtail appears outside the chest cavity. There is a moderate right-sided pneumothorax which has increased from the previous exam measuring approximately 5.3 cm in thickness. Previously 1.5 cm. The heart size appears normal. No pleural effusions. Atelectasis noted within the lung bases. IMPRESSION: 1. Interval increase in volume of right-sided pneumothorax. The right chest tube appears to have been fact out and may need to be re-advanced. Electronically Signed   By: Kerby Moors M.D.   On: 07/20/2017 11:26   Ct Head W & Wo Contrast  Result Date: 07/13/2017 CLINICAL DATA:  Left hand tingling after breast surgery EXAM: CT HEAD WITHOUT AND WITH CONTRAST TECHNIQUE: Contiguous axial images were obtained from the base of the skull through the vertex without and with intravenous contrast CONTRAST:  71m ISOVUE-300 IOPAMIDOL (ISOVUE-300) INJECTION 61%, 347mISOVUE-300 IOPAMIDOL (ISOVUE-300) INJECTION 61% COMPARISON:  None. FINDINGS: Brain: No mass lesion, intraparenchymal hemorrhage or extra-axial collection. No evidence of acute cortical infarct.  There is periventricular hypoattenuation compatible with chronic microvascular disease. There is bilateral basal ganglia mineralization. No abnormal contrast enhancement. 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 Vascular: No hyperdense vessel or unexpected calcification. Skull: Normal visualized skull base, calvarium and extracranial soft tissues. Sinuses/Orbits: No sinus fluid levels or advanced mucosal thickening. No mastoid effusion. Normal orbits. IMPRESSION: No acute intracranial abnormality or evidence of metastatic disease. Electronically Signed   By: KeUlyses Jarred.D.   On: 07/13/2017 21:56   Ct Angio Chest Pe W Or Wo Contrast  Result Date: 07/18/2017 CLINICAL DATA:  Shortness of breath with chest pressure. Recent left mastectomy for breast cancer. Former smoker. EXAM: CT ANGIOGRAPHY CHEST WITH CONTRAST TECHNIQUE: Multidetector CT imaging of the chest was performed using the standard protocol during bolus administration of intravenous contrast. Multiplanar CT image reconstructions and MIPs were obtained to evaluate the vascular anatomy. CONTRAST:  100 cc Isovue-300 COMPARISON:  None. FINDINGS: Cardiovascular: There is no pulmonary embolism identified within the main, lobar or segmental pulmonary arteries  bilaterally. Thoracic aorta is normal in caliber and configuration. No evidence of aortic dissection. Mediastinum/Nodes: Trace pericardial effusion versus, more likely, anterior pericardial wall thickening related to previous radiation therapy. No mass or enlarged lymph nodes within the mediastinum or perihilar regions. Esophagus appears normal. Lungs/Pleura: Large right-sided pneumothorax. At least mild shift of the mediastinal structures towards the left hemithorax. Associated airspace collapse of the right lung posteriorly. Left lung is clear. Upper Abdomen: No acute findings. Musculoskeletal: Mild degenerative spurring and ankylosis throughout the  slightly kyphotic and scoliotic thoracolumbar spine. No acute or suspicious osseous finding. Left chest wall drain status post mastectomy. Right IJ Port-A-Cath in place. Review of the MIP images confirms the above findings. IMPRESSION: 1. Very large RIGHT-SIDED pneumothorax. At least mild associated leftward shift of the mediastinal structures. Associated airspace collapse of the right lung posteriorly. 2. No pulmonary embolism. 3. Probable anterior pericardial wall thickening related to previous radiation therapy, less likely trace pericardial effusion. 4. No aortic aneurysm or aortic dissection. 5. Heart size is normal. Critical Value/emergent results were called by telephone at the time of interpretation on 07/18/2017 at 7:05 pm to Dr. Barry Dienes , who verbally acknowledged these results. At Dr. Marlowe Aschoff request, patient was sent to the emergency room at [facility]. Electronically Signed   By: Franki Cabot M.D.   On: 07/18/2017 19:12   Dg Chest Port 1 View  Result Date: 07/19/2017 CLINICAL DATA:  Right-sided pneumothorax. EXAM: PORTABLE CHEST 1 VIEW COMPARISON:  Radiograph of July 18, 2017. FINDINGS: Stable cardiomediastinal silhouette. Stable position of right-sided chest tube. Mild right apical pneumothorax is noted and unchanged. Stable position of right internal jugular catheter. Stable mild right basilar subsegmental atelectasis is noted. Left lung is clear. Mild right upper lobe opacity is noted concerning for possible early infiltrate. Bony thorax is unremarkable. IMPRESSION: Stable mild right apical pneumothorax. Stable position of chest tube. Stable mild right basilar subsegmental atelectasis. Mild right upper lobe opacity is noted concerning for possible early infiltrate. Electronically Signed   By: Marijo Conception, M.D.   On: 07/19/2017 07:25   Dg Chest Portable 1 View  Result Date: 07/18/2017 CLINICAL DATA:  Shortness of breath EXAM: PORTABLE CHEST 1 VIEW COMPARISON:  CT 07/18/2017,  radiograph 07/13/2017 FINDINGS: Interim placement of a right upper lateral chest tube. Small residual right apical pneumothorax, demonstrating about 12 mm of pleural-parenchymal separation. Right-sided central venous port tip overlies the distal SVC. Probable tiny right effusion and minimal atelectasis at the right base. Stable cardiomediastinal silhouette. Postsurgical changes of the left axilla and breast. IMPRESSION: 1. Placement of a right-sided chest tube with the pigtail visualized over the upper lateral aspect of the right thorax. Small residual right apical pneumothorax. 2. Tiny right effusion and minimal bibasilar atelectasis Electronically Signed   By: Donavan Foil M.D.   On: 07/18/2017 21:19   Dg Chest Port 1 View  Result Date: 07/13/2017 CLINICAL DATA:  Port-A-Cath placement EXAM: PORTABLE CHEST 1 VIEW COMPARISON:  None. FINDINGS: Right internal jugular Port-A-Cath terminates at the cavoatrial junction. Top-normal heart size. Aortic atherosclerosis. Otherwise normal mediastinal contour. No pneumothorax. No pleural effusion. No pulmonary edema. Mild left basilar atelectasis. Surgical clips overlie the left axilla and left chest wall. IMPRESSION: 1. No pneumothorax. Right internal jugular Port-A-Cath terminates at the cavoatrial junction. 2. Mild left basilar atelectasis. Electronically Signed   By: Ilona Sorrel M.D.   On: 07/13/2017 13:29   Dg Fluoro Guide Cv Line-no Report  Result Date: 07/13/2017 Fluoroscopy was utilized by the  requesting physician.  No radiographic interpretation.   US Breast Ltd Uni Left Inc Axilla  Result Date: 06/23/2017 CLINICAL DATA:  Screening recall for possible left breast distortion and calcifications. Patient has history of prior left breast cancer post lumpectomy 2001. EXAM: 2D DIGITAL DIAGNOSTIC UNILATERAL LEFT MAMMOGRAM WITH CAD AND ADJUNCT TOMO LEFT BREAST ULTRASOUND COMPARISON:  Previous exam(s). ACR Breast Density Category c: The breast tissue is  heterogeneously dense, which may obscure small masses. FINDINGS: Spot compression CC and MLO tomograms were performed over the upper-outer left breast demonstrating a persistent asymmetry/possible mass. Postsurgical changes with associated distortion are seen in the upper-outer left breast related to prior lumpectomy. Spot compression magnification views of the upper-outer left breast demonstrate scattered round and punctate calcifications as well as dystrophic calcifications with a probable suture calcification, all benign in appearance. Mammographic images were processed with CAD. Physical examination of the left breast reveals 2 horizontally oriented scars above the level of the nipple. No definite underlying palpable masses. Targeted ultrasound of the left breast was performed demonstrating a hypoechoic mass with irregular margins at the 2:30 position 4 cm from nipple measuring 1.4 x 0.8 x 1.2 cm. Calcifications are seen within this mass. This could represent an area of fibrocystic change, however cannot be clearly characterized as such. No lymphadenopathy seen in the left axilla. IMPRESSION: Indeterminate mass in the upper-outer left breast. RECOMMENDATION: Ultrasound-guided biopsy of the mass in the upper-outer left breast is recommended. This is being scheduled for the patient. I have discussed the findings and recommendations with the patient. Results were also provided in writing at the conclusion of the visit. If applicable, a reminder letter will be sent to the patient regarding the next appointment. BI-RADS CATEGORY  4: Suspicious. Electronically Signed   By: Everlean Alstrom M.D.   On: 06/23/2017 16:28   Mm Diag Breast Tomo Uni Left  Result Date: 06/23/2017 CLINICAL DATA:  Screening recall for possible left breast distortion and calcifications. Patient has history of prior left breast cancer post lumpectomy 2001. EXAM: 2D DIGITAL DIAGNOSTIC UNILATERAL LEFT MAMMOGRAM WITH CAD AND ADJUNCT TOMO LEFT  BREAST ULTRASOUND COMPARISON:  Previous exam(s). ACR Breast Density Category c: The breast tissue is heterogeneously dense, which may obscure small masses. FINDINGS: Spot compression CC and MLO tomograms were performed over the upper-outer left breast demonstrating a persistent asymmetry/possible mass. Postsurgical changes with associated distortion are seen in the upper-outer left breast related to prior lumpectomy. Spot compression magnification views of the upper-outer left breast demonstrate scattered round and punctate calcifications as well as dystrophic calcifications with a probable suture calcification, all benign in appearance. Mammographic images were processed with CAD. Physical examination of the left breast reveals 2 horizontally oriented scars above the level of the nipple. No definite underlying palpable masses. Targeted ultrasound of the left breast was performed demonstrating a hypoechoic mass with irregular margins at the 2:30 position 4 cm from nipple measuring 1.4 x 0.8 x 1.2 cm. Calcifications are seen within this mass. This could represent an area of fibrocystic change, however cannot be clearly characterized as such. No lymphadenopathy seen in the left axilla. IMPRESSION: Indeterminate mass in the upper-outer left breast. RECOMMENDATION: Ultrasound-guided biopsy of the mass in the upper-outer left breast is recommended. This is being scheduled for the patient. I have discussed the findings and recommendations with the patient. Results were also provided in writing at the conclusion of the visit. If applicable, a reminder letter will be sent to the patient regarding the next appointment. BI-RADS CATEGORY  4: Suspicious. Electronically Signed   By: Everlean Alstrom M.D.   On: 06/23/2017 16:28   Mm Clip Placement Left  Result Date: 06/28/2017 CLINICAL DATA:  Confirmation clip placement after ultrasound-guided core needle biopsy of an indeterminate mass in the upper outer quadrant of the left  breast. EXAM: DIAGNOSTIC LEFT MAMMOGRAM POST ULTRASOUND BIOPSY COMPARISON:  Previous exam(s). FINDINGS: Mammographic images were obtained following ultrasound guided biopsy of biopsy of an indeterminate mass in the upper outer quadrant of the left breast. The coil shaped tissue marker clip is appropriately positioned in the upper outer quadrant at the site of the biopsied mass. Expected post biopsy changes are present without evidence of hematoma. IMPRESSION: Appropriate positioning of the ribbon shaped tissue marker clip at the site of the biopsied mass in the upper outer quadrant of the left breast. Final Assessment: Post Procedure Mammograms for Marker Placement Electronically Signed   By: Evangeline Dakin M.D.   On: 06/28/2017 15:35   [place] Lt Breast Bx W Loc Dev 1st Lesion Img Bx Spec US Guide  Addendum Date: 06/30/2017   ADDENDUM REPORT: 06/29/2017 14:54 ADDENDUM: Pathology revealed GRADE III INVASIVE MAMMARY CARCINOMA WITH CALCIFICATIONS of the Left breast, 2:30 o'clock, 4 cm from nipple. This was found to be concordant by Dr. Peggye Fothergill. Pathology results were discussed with the patient by telephone. The patient reported doing well after the biopsy with tenderness and minimal bleeding at the site. Post biopsy instructions and care were reviewed and questions were answered. The patient was encouraged to call [facility] for any additional concerns. The patient was referred to [facility] on July 06, 2017. Pathology results reported by Terie Purser, RN on 06/29/2017. Electronically Signed   By: Evangeline Dakin M.D.   On: 06/29/2017 14:54   Result Date: 06/30/2017 CLINICAL DATA:  81 year old with personal history of malignant lumpectomy of the upper outer quadrant of the left breast in 2001, presenting with a screening detected 1.2 cm indeterminate mass in the upper outer quadrant of the left breast  at the 2:30 o'clock position approximately 4 cm from the nipple. EXAM: ULTRASOUND GUIDED LEFT BREAST CORE NEEDLE BIOPSY COMPARISON:  Previous exam(s). FINDINGS: I met with the patient and we discussed the procedure of ultrasound-guided biopsy, including benefits and alternatives. We discussed the high likelihood of a successful procedure. We discussed the risks of the procedure, including infection, bleeding, tissue injury, clip migration, and inadequate sampling. Informed written consent was given. The usual time-out protocol was performed immediately prior to the procedure. Lesion quadrant: Upper outer quadrant. Using sterile technique with chlorhexidine as skin antisepsis, 1% Lidocaine as local anesthetic, under direct ultrasound visualization, a 12 gauge Bard Marquee core needle device was used to perform biopsy of the indeterminate mass in the upper outer quadrant of the left breast at the 2:30 o'clock position approximately 4 cm from using a lateral approach. At the conclusion of the procedure a ribbon tissue marker clip was deployed into the biopsy cavity. Follow up 2 view mammogram was performed and dictated separately. IMPRESSION: Ultrasound guided biopsy of an indeterminate mass in the upper outer quadrant of the left breast. No apparent complications. Electronically Signed: By: Evangeline Dakin M.D. On: 06/28/2017 15:33    Labs:  CBC:  Recent Labs  07/06/17 1248 07/12/17 1543 07/18/17 2330  WBC 4.8 6.0 9.0  HGB 14.2 13.7 13.8  HCT 42.3 41.1 40.5  PLT 201 220 210  COAGS: No results for input(s): INR, APTT in the last 8760 hours.  BMP:  Recent Labs  07/06/17 1248 07/12/17 1543 07/18/17 2330  NA 137 135 136  K 4.3 4.0 3.8  CL  --  103 102  CO2 _0 GLUCOSE 89 98 96  BUN 10._1 CALCIUM 9.4 8.9 8.7*  CREATININE 0.6 0.56 0.41*  GFRNONAA  --  >60 >60  GFRAA  --  >60 >60    LIVER FUNCTION TESTS:  Recent Labs  07/06/17 1248  BILITOT 0.52  AST 18  ALT 16    ALKPHOS 88  PROT 7.1  ALBUMIN 4.0    TUMOR MARKERS: No results for input(s): AFPTM, CEA, CA199, CHROMGRNA in the last 8760 hours.  Assessment and Plan: 81 y.o. female with history of left breast cancer, status post left mastectomy with right chest wall Port-A-Cath placement on 07/13/17 by [facility]. Chest x-ray post Port-A-Cath placement revealed no pneumothorax. Patient presented to ED on 9/24 with progressive dyspnea and follow-up imaging revealed large pneumothorax. A smallbore chest tube was placed by ED staff. Chest x-ray today revealed interval increase in volume of right-sided pneumothorax with chest tube appearing to have been retracted somewhat. Request now received from surgery for right chest tube manipulation/repositioning or possible new placement if necessary. Latest imaging studies have been reviewed by Dr. Laurence Ferrari. Details/risks of procedure, including not limited to, internal bleeding, infection, injury to adjacent structures, need for prolonged chest tube drainage discussed with patient with her understanding and consent. Case discussed with Dr. Marlou Starks.   Thank you for this interesting consult.  I greatly enjoyed meeting Kathleen Hanson and look forward to participating in their care.  A copy of this report was sent to the requesting provider on this date.  Electronically Signed: D. Rowe Robert, PA-C 07/20/2017, 1:12 PM   I spent a total of 25 minutes  in face to face in clinical consultation, greater than 50% of which was counseling/coordinating care for right chest tube manipulation/repositioning or possible new placement

## 2017-07-20 NOTE — Progress Notes (Signed)
Subjective/Chief Complaint: She says she is depressed. No real shortness of breath. pleurivac does have an air leak   Objective: Vital signs in last 24 hours: Temp:  [97.9 F (36.6 C)-98.8 F (37.1 C)] 98.3 F (36.8 C) (09/26 0958) Pulse Rate:  [60-80] 80 (09/26 0958) Resp:  [18-20] 20 (09/26 0958) BP: (104-183)/(63-77) 183/77 (09/26 0958) SpO2:  [93 %-99 %] 96 % (09/26 0958)    Intake/Output from previous day: 09/25 0701 - 09/26 0700 In: 43 [P.O.:540] Out: 845 [Urine:800; Drains:45] Intake/Output this shift: Total I/O In: 700 [P.O.:700] Out: 354 [Urine:240; Drains:14; Stool:100]  General appearance: alert and cooperative Resp: clear to auscultation bilaterally Cardio: regular rate and rhythm GI: soft, non-tender; bowel sounds normal; no masses,  no organomegaly  Lab Results:   Recent Labs  07/18/17 2330  WBC 9.0  HGB 13.8  HCT 40.5  PLT 210   BMET  Recent Labs  07/18/17 2330  NA 136  K 3.8  CL 102  CO2 23  GLUCOSE 96  BUN 12  CREATININE 0.41*  CALCIUM 8.7*   PT/INR No results for input(s): LABPROT, INR in the last 72 hours. ABG No results for input(s): PHART, HCO3 in the last 72 hours.  Invalid input(s): PCO2, PO2  Studies/Results: Dg Chest 2 View  Result Date: 07/20/2017 CLINICAL DATA:  Followup pneumothorax. EXAM: CHEST  2 VIEW COMPARISON:  07/19/2017 FINDINGS: There is a right chest wall port a catheter with tip in the SVC. There is also a right-sided pigtail catheter within the left chest. This appears under advanced as a portion of the pigtail appears outside the chest cavity. There is a moderate right-sided pneumothorax which has increased from the previous exam measuring approximately 5.3 cm in thickness. Previously 1.5 cm. The heart size appears normal. No pleural effusions. Atelectasis noted within the lung bases. IMPRESSION: 1. Interval increase in volume of right-sided pneumothorax. The right chest tube appears to have been fact out  and may need to be re-advanced. Electronically Signed   By: Kerby Moors M.D.   On: 07/20/2017 11:26   Ct Angio Chest Pe W Or Wo Contrast  Result Date: 07/18/2017 CLINICAL DATA:  Shortness of breath with chest pressure. Recent left mastectomy for breast cancer. Former smoker. EXAM: CT ANGIOGRAPHY CHEST WITH CONTRAST TECHNIQUE: Multidetector CT imaging of the chest was performed using the standard protocol during bolus administration of intravenous contrast. Multiplanar CT image reconstructions and MIPs were obtained to evaluate the vascular anatomy. CONTRAST:  100 cc Isovue-300 COMPARISON:  None. FINDINGS: Cardiovascular: There is no pulmonary embolism identified within the main, lobar or segmental pulmonary arteries bilaterally. Thoracic aorta is normal in caliber and configuration. No evidence of aortic dissection. Mediastinum/Nodes: Trace pericardial effusion versus, more likely, anterior pericardial wall thickening related to previous radiation therapy. No mass or enlarged lymph nodes within the mediastinum or perihilar regions. Esophagus appears normal. Lungs/Pleura: Large right-sided pneumothorax. At least mild shift of the mediastinal structures towards the left hemithorax. Associated airspace collapse of the right lung posteriorly. Left lung is clear. Upper Abdomen: No acute findings. Musculoskeletal: Mild degenerative spurring and ankylosis throughout the slightly kyphotic and scoliotic thoracolumbar spine. No acute or suspicious osseous finding. Left chest wall drain status post mastectomy. Right IJ Port-A-Cath in place. Review of the MIP images confirms the above findings. IMPRESSION: 1. Very large RIGHT-SIDED pneumothorax. At least mild associated leftward shift of the mediastinal structures. Associated airspace collapse of the right lung posteriorly. 2. No pulmonary embolism. 3. Probable anterior pericardial wall  thickening related to previous radiation therapy, less likely trace pericardial  effusion. 4. No aortic aneurysm or aortic dissection. 5. Heart size is normal. Critical Value/emergent results were called by telephone at the time of interpretation on 07/18/2017 at 7:05 pm to Dr. Barry Dienes , who verbally acknowledged these results. At Dr. Marlowe Aschoff request, patient was sent to the emergency room at [facility]. Electronically Signed   By: Franki Cabot M.D.   On: 07/18/2017 19:12   Dg Chest Port 1 View  Result Date: 07/19/2017 CLINICAL DATA:  Right-sided pneumothorax. EXAM: PORTABLE CHEST 1 VIEW COMPARISON:  Radiograph of July 18, 2017. FINDINGS: Stable cardiomediastinal silhouette. Stable position of right-sided chest tube. Mild right apical pneumothorax is noted and unchanged. Stable position of right internal jugular catheter. Stable mild right basilar subsegmental atelectasis is noted. Left lung is clear. Mild right upper lobe opacity is noted concerning for possible early infiltrate. Bony thorax is unremarkable. IMPRESSION: Stable mild right apical pneumothorax. Stable position of chest tube. Stable mild right basilar subsegmental atelectasis. Mild right upper lobe opacity is noted concerning for possible early infiltrate. Electronically Signed   By: Marijo Conception, M.D.   On: 07/19/2017 07:25   Dg Chest Portable 1 View  Result Date: 07/18/2017 CLINICAL DATA:  Shortness of breath EXAM: PORTABLE CHEST 1 VIEW COMPARISON:  CT 07/18/2017, radiograph 07/13/2017 FINDINGS: Interim placement of a right upper lateral chest tube. Small residual right apical pneumothorax, demonstrating about 12 mm of pleural-parenchymal separation. Right-sided central venous port tip overlies the distal SVC. Probable tiny right effusion and minimal atelectasis at the right base. Stable cardiomediastinal silhouette. Postsurgical changes of the left axilla and breast. IMPRESSION: 1. Placement of a right-sided chest tube with the pigtail visualized over the upper lateral aspect of the right thorax. Small  residual right apical pneumothorax. 2. Tiny right effusion and minimal bibasilar atelectasis Electronically Signed   By: Donavan Foil M.D.   On: 07/18/2017 21:19    Anti-infectives: Anti-infectives    None      Assessment/Plan: s/p * No surgery found * will check cxr. may need to have IR reposition tube or just place a chest tube  Advance diet. ambulate  LOS: 2 days    TOTH III,Markee Remlinger S 07/20/2017

## 2017-07-20 NOTE — Progress Notes (Signed)
   07/20/17 1300  Clinical Encounter Type  Visited With Patient;Health care provider  Visit Type Initial  Referral From Nurse  Consult/Referral To Chaplain  Spiritual Encounters  Spiritual Needs Emotional   Per a request from the nurse, I visited with the patient.  She is [employer], but very much appreciated my visit.  As we began to talk the patient shared some of her journey.  We were interrupted as she had to go for a procedure.  Chaplain will follow up. Chaplain Katherene Ponto

## 2017-07-20 NOTE — Progress Notes (Signed)
Bedside report given to O'Brien, asked if any needs or issues need to be met or resolved before this RN leaves the floor, oncoming RN denied.

## 2017-07-21 ENCOUNTER — Inpatient Hospital Stay (HOSPITAL_COMMUNITY): Payer: Medicare HMO

## 2017-07-21 ENCOUNTER — Encounter (HOSPITAL_COMMUNITY): Payer: Self-pay

## 2017-07-21 NOTE — Plan of Care (Signed)
Problem: Safety: Goal: Ability to remain free from injury will improve Outcome: Progressing Patient is aware of safety precautions, no safety issues noted  Problem: Pain Managment: Goal: General experience of comfort will improve Outcome: Progressing Denies pain and needs for pain medications  Problem: Physical Regulation: Goal: Will remain free from infection Outcome: Progressing No signs and symptoms of infection noted  Problem: Skin Integrity: Goal: Risk for impaired skin integrity will decrease Outcome: Progressing No skin issues noted, dressing to right chest tube clean, ry and intact  Problem: Activity: Goal: Risk for activity intolerance will decrease Outcome: Progressing Tolerates activities well , OOB to BR with minimum assistance  Problem: Bowel/Gastric: Goal: Will not experience complications related to bowel motility Outcome: Progressing Denies bowel issues

## 2017-07-21 NOTE — Progress Notes (Signed)
Subjective/Chief Complaint: No complaints but wants to go home   Objective: Vital signs in last 24 hours: Temp:  [98 F (36.7 C)-98.2 F (36.8 C)] 98.2 F (36.8 C) (09/27 0756) Pulse Rate:  [62-85] 63 (09/27 0756) Resp:  [10-19] 16 (09/27 0756) BP: (138-188)/(60-88) 155/88 (09/27 0756) SpO2:  [91 %-98 %] 97 % (09/27 0756) Last BM Date: 07/20/17  Intake/Output from previous day: 09/26 0701 - 09/27 0700 In: 1680 [P.O.:1680] Out: 1684 [Urine:1625; Drains:59] Intake/Output this shift: Total I/O In: 480 [P.O.:480] Out: 355 [Urine:350; Drains:5]  General appearance: alert and cooperative Resp: clear to auscultation bilaterally Cardio: regular rate and rhythm GI: soft, non-tender; bowel sounds normal; no masses,  no organomegaly  Lab Results:   Recent Labs  07/18/17 2330  WBC 9.0  HGB 13.8  HCT 40.5  PLT 210   BMET  Recent Labs  07/18/17 2330  NA 136  K 3.8  CL 102  CO2 23  GLUCOSE 96  BUN 12  CREATININE 0.41*  CALCIUM 8.7*   PT/INR No results for input(s): LABPROT, INR in the last 72 hours. ABG No results for input(s): PHART, HCO3 in the last 72 hours.  Invalid input(s): PCO2, PO2  Studies/Results: Dg Chest 2 View  Result Date: 07/21/2017 CLINICAL DATA:  Right pneumothorax followup EXAM: CHEST  2 VIEW COMPARISON:  07/21/2017 FINDINGS: 30-40% right pneumothorax, enlarged from prior. A right pleural drainage pigtail catheter is in place. A left thoracic drain is also in place.  Left axillary clips. Power injectable right IJ Port-A-Cath tip:  SVC. Accounting for the degree of leftward rotation, which is moderate, there is no significant shift of cardiac or mediastinal structures to the left to suggest tension pneumothorax. Slight blunting of the left lateral costophrenic angle. IMPRESSION: 1. Increase in size of right pneumothorax, currently 30-40% of right hemithoracic volume. A right pleural drainage catheter remains in place. No tension component of the  pneumothorax seen. 2. Left chest wall drain noted. 3. Thoracic spondylosis. 4. Slight blunting of the left lateral costophrenic angle, small pleural effusion not excluded. Electronically Signed   By: Van Clines M.D.   On: 07/21/2017 12:09   Dg Chest 2 View  Result Date: 07/20/2017 CLINICAL DATA:  Followup pneumothorax. EXAM: CHEST  2 VIEW COMPARISON:  07/19/2017 FINDINGS: There is a right chest wall port a catheter with tip in the SVC. There is also a right-sided pigtail catheter within the left chest. This appears under advanced as a portion of the pigtail appears outside the chest cavity. There is a moderate right-sided pneumothorax which has increased from the previous exam measuring approximately 5.3 cm in thickness. Previously 1.5 cm. The heart size appears normal. No pleural effusions. Atelectasis noted within the lung bases. IMPRESSION: 1. Interval increase in volume of right-sided pneumothorax. The right chest tube appears to have been fact out and may need to be re-advanced. Electronically Signed   By: Kerby Moors M.D.   On: 07/20/2017 11:26   Ir Catheter Tube Change  Result Date: 07/20/2017 INDICATION: 81 year old female with pneumothorax following port catheter placement. She had a percutaneous chest tube placed recently but the chest tube is become partially displaced and she has a recurrent pneumothorax. She presents for chest tube repositioning and replacement. EXAM: IR CATHETER TUBE CHANGE MEDICATIONS: None ANESTHESIA/SEDATION: Fentanyl 50 mcg IV; Versed 1 mg IV Moderate Sedation Time:  16 minutes The patient was continuously monitored during the procedure by the interventional radiology nurse under my direct supervision. COMPLICATIONS: None immediate. FLUOROSCOPY  TIME:  1 minute 0 seconds 5 mGy PROCEDURE: Informed written consent was obtained from the patient after a thorough discussion of the procedural risks, benefits and alternatives. All questions were addressed. Maximal  Sterile Barrier Technique was utilized including caps, mask, sterile gowns, sterile gloves, sterile drape, hand hygiene and skin antiseptic. A timeout was performed prior to the initiation of the procedure. The existing retention suture was cut and removed. The tube was transected. An Amplatz wire was carefully advanced through the tube and directed to the apex of the right hemithorax. The existing 68 French tube was then removed over the wire. A new sterile 16 French tube was then advanced over the wire and formed in the apex of the right pleural space. The catheter was connected to wall suction and secured to the skin with 0 Prolene suture and an air tight bandage. Follow-up chest x-ray obtained on the fluoroscopy table demonstrates a well-positioned tube an no evidence residual pneumothorax. IMPRESSION: Successful repositioning an exchange for a new 16 French right-sided chest tube. The chest tube is now positioned at the right lung apex with successful re-expansion of the lung. Electronically Signed   By: Jacqulynn Cadet M.D.   On: 07/20/2017 17:13   Dg Chest Port 1 View  Result Date: 07/21/2017 CLINICAL DATA:  Persistent pneumothorax. Chest tube replacement on July 20, 2017 EXAM: PORTABLE CHEST 1 VIEW COMPARISON:  New chest x-ray of July 20, 2017 FINDINGS: The lungs are well-expanded. The pneumothorax on the right has decreased in volume. The pleural line is visible overlying the posterior aspect of the right fourth rib. The pigtail of the small caliber chest tube projects over the lateral aspect of the hemithorax. There is no mediastinal shift. The left lung is clear. The cardiac silhouette is top-normal in size. The pulmonary vascularity is normal. Minimal right basilar atelectasis persists. A tubular project structure projects over the lower left lung and left upper abdomen which may reflect a drainage catheter. IMPRESSION: Decreased volume of the right pleural effusion. The right chest tube  is in appropriate position. Electronically Signed   By: David  Martinique M.D.   On: 07/21/2017 07:45    Anti-infectives: Anti-infectives    None      Assessment/Plan: s/p * No surgery found * Advance diet  Increase pleurivac to 40cm h2o. Recheck cxr in am Consult thoracic surgery for persistent air leak  LOS: 3 days    TOTH III,PAUL S 07/21/2017

## 2017-07-21 NOTE — Progress Notes (Signed)
Referring Physician(s): Toth,P  Supervising Physician: Aletta Edouard  Patient Status:  [facility] - In-pt  Chief Complaint:  Right pneumothorax  Subjective:  Pt resting comfortably in bed; denies worsening chest pain/dyspnea or cough  Allergies: Patient has no known allergies.  Medications: Prior to Admission medications   Medication Sig Start Date End Date Taking? Authorizing Provider  aspirin 81 MG chewable tablet Chew 81 mg by mouth daily.    Yes [provider]  cholecalciferol (VITAMIN D) 1000 units tablet Take 1,000 Units by mouth daily.   Yes [provider]  HYDROcodone-acetaminophen (NORCO/VICODIN) 5-325 MG tablet Take 1-2 tablets by mouth every 4 (four) hours as needed for moderate pain. 07/14/17  Yes [name], MD  Multiple Vitamin (MULTIVITAMIN WITH MINERALS) TABS tablet Take 1 tablet by mouth daily.   Yes [provider]  polyethylene glycol powder (GLYCOLAX/MIRALAX) powder MIX 17 GRAMS IN LIQUID AND DRINK DAILY IN THE EVENING 03/26/16  Yes [provider]  pravastatin (PRAVACHOL) 40 MG tablet Take 40 mg by mouth daily.   Yes [provider]  vitamin C (ASCORBIC ACID) 500 MG tablet Take 500 mg by mouth daily.   Yes [provider]     Vital Signs: BP (!) 169/83 (BP Location: Right Leg)   Pulse 79   Temp 98.6 F (37 C) (Oral)   Resp 14   Ht 5\' 4"  (1.626 m)   Wt 130 lb (59 kg)   SpO2 99%   BMI 22.31 kg/m   Physical Exam awake/alert; rt chest drain intact, insertion site ok, + air leak noted, pleuravac to wall suction  Imaging: Dg Chest 2 View  Result Date: 07/21/2017 CLINICAL DATA:  Right pneumothorax followup EXAM: CHEST  2 VIEW COMPARISON:  07/21/2017 FINDINGS: 30-40% right pneumothorax, enlarged from prior. A right pleural drainage pigtail catheter is in place. A left thoracic drain is also in place.  Left axillary clips. Power injectable right IJ Port-A-Cath tip:  SVC. Accounting for the degree of  leftward rotation, which is moderate, there is no significant shift of cardiac or mediastinal structures to the left to suggest tension pneumothorax. Slight blunting of the left lateral costophrenic angle. IMPRESSION: 1. Increase in size of right pneumothorax, currently 30-40% of right hemithoracic volume. A right pleural drainage catheter remains in place. No tension component of the pneumothorax seen. 2. Left chest wall drain noted. 3. Thoracic spondylosis. 4. Slight blunting of the left lateral costophrenic angle, small pleural effusion not excluded. Electronically Signed   By: Van Clines M.D.   On: 07/21/2017 12:09   Dg Chest 2 View  Result Date: 07/20/2017 CLINICAL DATA:  Followup pneumothorax. EXAM: CHEST  2 VIEW COMPARISON:  07/19/2017 FINDINGS: There is a right chest wall port a catheter with tip in the SVC. There is also a right-sided pigtail catheter within the left chest. This appears under advanced as a portion of the pigtail appears outside the chest cavity. There is a moderate right-sided pneumothorax which has increased from the previous exam measuring approximately 5.3 cm in thickness. Previously 1.5 cm. The heart size appears normal. No pleural effusions. Atelectasis noted within the lung bases. IMPRESSION: 1. Interval increase in volume of right-sided pneumothorax. The right chest tube appears to have been fact out and may need to be re-advanced. Electronically Signed   By: Kerby Moors M.D.   On: 07/20/2017 11:26   Ct Angio Chest Pe W Or Wo Contrast  Result Date: 07/18/2017 CLINICAL DATA:  Shortness of breath  with chest pressure. Recent left mastectomy for breast cancer. Former smoker. EXAM: CT ANGIOGRAPHY CHEST WITH CONTRAST TECHNIQUE: Multidetector CT imaging of the chest was performed using the standard protocol during bolus administration of intravenous contrast. Multiplanar CT image reconstructions and MIPs were obtained to evaluate the vascular anatomy. CONTRAST:  100 cc  Isovue-300 COMPARISON:  None. FINDINGS: Cardiovascular: There is no pulmonary embolism identified within the main, lobar or segmental pulmonary arteries bilaterally. Thoracic aorta is normal in caliber and configuration. No evidence of aortic dissection. Mediastinum/Nodes: Trace pericardial effusion versus, more likely, anterior pericardial wall thickening related to previous radiation therapy. No mass or enlarged lymph nodes within the mediastinum or perihilar regions. Esophagus appears normal. Lungs/Pleura: Large right-sided pneumothorax. At least mild shift of the mediastinal structures towards the left hemithorax. Associated airspace collapse of the right lung posteriorly. Left lung is clear. Upper Abdomen: No acute findings. Musculoskeletal: Mild degenerative spurring and ankylosis throughout the slightly kyphotic and scoliotic thoracolumbar spine. No acute or suspicious osseous finding. Left chest wall drain status post mastectomy. Right IJ Port-A-Cath in place. Review of the MIP images confirms the above findings. IMPRESSION: 1. Very large RIGHT-SIDED pneumothorax. At least mild associated leftward shift of the mediastinal structures. Associated airspace collapse of the right lung posteriorly. 2. No pulmonary embolism. 3. Probable anterior pericardial wall thickening related to previous radiation therapy, less likely trace pericardial effusion. 4. No aortic aneurysm or aortic dissection. 5. Heart size is normal. Critical Value/emergent results were called by telephone at the time of interpretation on 07/18/2017 at 7:05 pm to Dr. Barry Dienes , who verbally acknowledged these results. At Dr. Marlowe Aschoff request, patient was sent to the emergency room at [facility]. Electronically Signed   By: Franki Cabot M.D.   On: 07/18/2017 19:12   Ir Catheter Tube Change  Result Date: 07/20/2017 INDICATION: 81 year old female with pneumothorax following port catheter placement. She had a percutaneous chest tube placed  recently but the chest tube is become partially displaced and she has a recurrent pneumothorax. She presents for chest tube repositioning and replacement. EXAM: IR CATHETER TUBE CHANGE MEDICATIONS: None ANESTHESIA/SEDATION: Fentanyl 50 mcg IV; Versed 1 mg IV Moderate Sedation Time:  16 minutes The patient was continuously monitored during the procedure by the interventional radiology nurse under my direct supervision. COMPLICATIONS: None immediate. FLUOROSCOPY TIME:  1 minute 0 seconds 5 mGy PROCEDURE: Informed written consent was obtained from the patient after a thorough discussion of the procedural risks, benefits and alternatives. All questions were addressed. Maximal Sterile Barrier Technique was utilized including caps, mask, sterile gowns, sterile gloves, sterile drape, hand hygiene and skin antiseptic. A timeout was performed prior to the initiation of the procedure. The existing retention suture was cut and removed. The tube was transected. An Amplatz wire was carefully advanced through the tube and directed to the apex of the right hemithorax. The existing 15 French tube was then removed over the wire. A new sterile 16 French tube was then advanced over the wire and formed in the apex of the right pleural space. The catheter was connected to wall suction and secured to the skin with 0 Prolene suture and an air tight bandage. Follow-up chest x-ray obtained on the fluoroscopy table demonstrates a well-positioned tube an no evidence residual pneumothorax. IMPRESSION: Successful repositioning an exchange for a new 16 French right-sided chest tube. The chest tube is now positioned at the right lung apex with successful re-expansion of the lung. Electronically Signed   By: Dellis Filbert.D.  On: 07/20/2017 17:13   Dg Chest Port 1 View  Result Date: 07/21/2017 CLINICAL DATA:  Persistent pneumothorax. Chest tube replacement on July 20, 2017 EXAM: PORTABLE CHEST 1 VIEW COMPARISON:  New chest x-ray of  July 20, 2017 FINDINGS: The lungs are well-expanded. The pneumothorax on the right has decreased in volume. The pleural line is visible overlying the posterior aspect of the right fourth rib. The pigtail of the small caliber chest tube projects over the lateral aspect of the hemithorax. There is no mediastinal shift. The left lung is clear. The cardiac silhouette is top-normal in size. The pulmonary vascularity is normal. Minimal right basilar atelectasis persists. A tubular project structure projects over the lower left lung and left upper abdomen which may reflect a drainage catheter. IMPRESSION: Decreased volume of the right pleural effusion. The right chest tube is in appropriate position. Electronically Signed   By: David  Martinique M.D.   On: 07/21/2017 07:45   Dg Chest Port 1 View  Result Date: 07/19/2017 CLINICAL DATA:  Right-sided pneumothorax. EXAM: PORTABLE CHEST 1 VIEW COMPARISON:  Radiograph of July 18, 2017. FINDINGS: Stable cardiomediastinal silhouette. Stable position of right-sided chest tube. Mild right apical pneumothorax is noted and unchanged. Stable position of right internal jugular catheter. Stable mild right basilar subsegmental atelectasis is noted. Left lung is clear. Mild right upper lobe opacity is noted concerning for possible early infiltrate. Bony thorax is unremarkable. IMPRESSION: Stable mild right apical pneumothorax. Stable position of chest tube. Stable mild right basilar subsegmental atelectasis. Mild right upper lobe opacity is noted concerning for possible early infiltrate. Electronically Signed   By: Marijo Conception, M.D.   On: 07/19/2017 07:25   Dg Chest Portable 1 View  Result Date: 07/18/2017 CLINICAL DATA:  Shortness of breath EXAM: PORTABLE CHEST 1 VIEW COMPARISON:  CT 07/18/2017, radiograph 07/13/2017 FINDINGS: Interim placement of a right upper lateral chest tube. Small residual right apical pneumothorax, demonstrating about 12 mm of pleural-parenchymal  separation. Right-sided central venous port tip overlies the distal SVC. Probable tiny right effusion and minimal atelectasis at the right base. Stable cardiomediastinal silhouette. Postsurgical changes of the left axilla and breast. IMPRESSION: 1. Placement of a right-sided chest tube with the pigtail visualized over the upper lateral aspect of the right thorax. Small residual right apical pneumothorax. 2. Tiny right effusion and minimal bibasilar atelectasis Electronically Signed   By: Donavan Foil M.D.   On: 07/18/2017 21:19    Labs:  CBC:  Recent Labs  07/06/17 1248 07/12/17 1543 07/18/17 2330  WBC 4.8 6.0 9.0  HGB 14.2 13.7 13.8  HCT 42.3 41.1 40.5  PLT 201 220 210    COAGS: No results for input(s): INR, APTT in the last 8760 hours.  BMP:  Recent Labs  07/06/17 1248 07/12/17 1543 07/18/17 2330  NA 137 135 136  K 4.3 4.0 3.8  CL  --  103 102  CO2 26 24 23   GLUCOSE 89 98 96  BUN 10.5 14 12   CALCIUM 9.4 8.9 8.7*  CREATININE 0.6 0.56 0.41*  GFRNONAA  --  >60 >60  GFRAA  --  >60 >60    LIVER FUNCTION TESTS:  Recent Labs  07/06/17 1248  BILITOT 0.52  AST 18  ALT 16  ALKPHOS 88  PROT 7.1  ALBUMIN 4.0    Assessment and Plan: 81 y.o. female with history of left breast cancer, status post left mastectomy with right chest wall Port-A-Cath placement on 07/13/17 by [facility]. Chest x-ray post Port-A-Cath placement  revealed no pneumothorax. Patient presented to ED on 9/24 with progressive dyspnea and follow-up imaging revealed large pneumothorax. A smallbore chest tube was placed by ED staff. Chest x-ray today revealed interval increase in volume of right-sided pneumothorax with chest tube appearing to have been retracted somewhat. Pt had repositioning/exchange of chest tube yesterday. CXR today with persistent rt ptx; tube in appropriate position; case reviewed by Dr. Kathlene Cote; rec cont tube to wall suction, recheck film in am; if no ptx in am , switch tube to water seal and  observe 24 hrs before next film. Other plans as per [facility].    Electronically Signed: D. Rowe Robert, PA-C 07/21/2017, 3:02 PM   I spent a total of 15 minutes at the the patient's bedside AND on the patient's hospital floor or unit, greater than 50% of which was counseling/coordinating care for right chest tube    Patient ID: Kathleen Hanson, female   DOB: [birth year], 81 y.o.   MRN: [id number]

## 2017-07-22 ENCOUNTER — Other Ambulatory Visit: Payer: Self-pay | Admitting: Hematology

## 2017-07-22 ENCOUNTER — Telehealth: Payer: Self-pay | Admitting: *Deleted

## 2017-07-22 ENCOUNTER — Inpatient Hospital Stay (HOSPITAL_COMMUNITY): Payer: Medicare HMO

## 2017-07-22 DIAGNOSIS — C50412 Malignant neoplasm of upper-outer quadrant of left female breast: Secondary | ICD-10-CM

## 2017-07-22 DIAGNOSIS — Z170 Estrogen receptor positive status [ER+]: Principal | ICD-10-CM

## 2017-07-22 NOTE — Progress Notes (Signed)
Patient ID: Kathleen Hanson, female   DOB: 14-Jul-1936, 81 y.o.   MRN: [id number]    Referring Physician(s): Toth,P  Supervising Physician: Corrie Mckusick  Patient Status:  [facility] - In-pt  Chief Complaint: Right pneumothorax   Subjective: Patient sitting up in bed, eating; denies any acute changes; eager to go home   Allergies: Patient has no known allergies.  Medications: Prior to Admission medications   Medication Sig Start Date End Date Taking? Authorizing Provider  aspirin 81 MG chewable tablet Chew 81 mg by mouth daily.    Yes [provider]  cholecalciferol (VITAMIN D) 1000 units tablet Take 1,000 Units by mouth daily.   Yes [provider]  HYDROcodone-acetaminophen (NORCO/VICODIN) 5-325 MG tablet Take 1-2 tablets by mouth every 4 (four) hours as needed for moderate pain. 07/14/17  Yes [name], MD  Multiple Vitamin (MULTIVITAMIN WITH MINERALS) TABS tablet Take 1 tablet by mouth daily.   Yes [provider]  polyethylene glycol powder (GLYCOLAX/MIRALAX) powder MIX 17 GRAMS IN LIQUID AND DRINK DAILY IN THE EVENING 03/26/16  Yes [provider]  pravastatin (PRAVACHOL) 40 MG tablet Take 40 mg by mouth daily.   Yes [provider]  vitamin C (ASCORBIC ACID) 500 MG tablet Take 500 mg by mouth daily.   Yes [provider]     Vital Signs: BP (!) 157/71 (BP Location: Right Leg)   Pulse 65   Temp 98 F (36.7 C) (Oral)   Resp 15   Ht 5\' 4"  (1.626 m)   Wt 130 lb (59 kg)   SpO2 95%   BMI 22.31 kg/m   Physical Exam right chest tube intact, dressing dry, still to wall suction, 60 mL amber fluid in Pleur-evac Still with air leak Imaging: Dg Chest 2 View  Result Date: 07/21/2017 CLINICAL DATA:  Right pneumothorax followup EXAM: CHEST  2 VIEW COMPARISON:  07/21/2017 FINDINGS: 30-40% right pneumothorax, enlarged from prior. A right pleural drainage pigtail catheter is in place. A left thoracic drain is also in place.   Left axillary clips. Power injectable right IJ Port-A-Cath tip:  SVC. Accounting for the degree of leftward rotation, which is moderate, there is no significant shift of cardiac or mediastinal structures to the left to suggest tension pneumothorax. Slight blunting of the left lateral costophrenic angle. IMPRESSION: 1. Increase in size of right pneumothorax, currently 30-40% of right hemithoracic volume. A right pleural drainage catheter remains in place. No tension component of the pneumothorax seen. 2. Left chest wall drain noted. 3. Thoracic spondylosis. 4. Slight blunting of the left lateral costophrenic angle, small pleural effusion not excluded. Electronically Signed   By: Van Clines M.D.   On: 07/21/2017 12:09   Dg Chest 2 View  Result Date: 07/20/2017 CLINICAL DATA:  Followup pneumothorax. EXAM: CHEST  2 VIEW COMPARISON:  07/19/2017 FINDINGS: There is a right chest wall port a catheter with tip in the SVC. There is also a right-sided pigtail catheter within the left chest. This appears under advanced as a portion of the pigtail appears outside the chest cavity. There is a moderate right-sided pneumothorax which has increased from the previous exam measuring approximately 5.3 cm in thickness. Previously 1.5 cm. The heart size appears normal. No pleural effusions. Atelectasis noted within the lung bases. IMPRESSION: 1. Interval increase in volume of right-sided pneumothorax. The right chest tube appears to have been fact out and may need to be re-advanced. Electronically Signed   By: Queen Slough.D.  On: 07/20/2017 11:26   Ct Angio Chest Pe W Or Wo Contrast  Result Date: 07/18/2017 CLINICAL DATA:  Shortness of breath with chest pressure. Recent left mastectomy for breast cancer. Former smoker. EXAM: CT ANGIOGRAPHY CHEST WITH CONTRAST TECHNIQUE: Multidetector CT imaging of the chest was performed using the standard protocol during bolus administration of intravenous contrast. Multiplanar CT  image reconstructions and MIPs were obtained to evaluate the vascular anatomy. CONTRAST:  100 cc Isovue-300 COMPARISON:  None. FINDINGS: Cardiovascular: There is no pulmonary embolism identified within the main, lobar or segmental pulmonary arteries bilaterally. Thoracic aorta is normal in caliber and configuration. No evidence of aortic dissection. Mediastinum/Nodes: Trace pericardial effusion versus, more likely, anterior pericardial wall thickening related to previous radiation therapy. No mass or enlarged lymph nodes within the mediastinum or perihilar regions. Esophagus appears normal. Lungs/Pleura: Large right-sided pneumothorax. At least mild shift of the mediastinal structures towards the left hemithorax. Associated airspace collapse of the right lung posteriorly. Left lung is clear. Upper Abdomen: No acute findings. Musculoskeletal: Mild degenerative spurring and ankylosis throughout the slightly kyphotic and scoliotic thoracolumbar spine. No acute or suspicious osseous finding. Left chest wall drain status post mastectomy. Right IJ Port-A-Cath in place. Review of the MIP images confirms the above findings. IMPRESSION: 1. Very large RIGHT-SIDED pneumothorax. At least mild associated leftward shift of the mediastinal structures. Associated airspace collapse of the right lung posteriorly. 2. No pulmonary embolism. 3. Probable anterior pericardial wall thickening related to previous radiation therapy, less likely trace pericardial effusion. 4. No aortic aneurysm or aortic dissection. 5. Heart size is normal. Critical Value/emergent results were called by telephone at the time of interpretation on 07/18/2017 at 7:05 pm to Dr. Barry Dienes , who verbally acknowledged these results. At Dr. Marlowe Aschoff request, patient was sent to the emergency room at [facility]. Electronically Signed   By: Franki Cabot M.D.   On: 07/18/2017 19:12   Ir Catheter Tube Change  Result Date: 07/20/2017 INDICATION: 81 year old  female with pneumothorax following port catheter placement. She had a percutaneous chest tube placed recently but the chest tube is become partially displaced and she has a recurrent pneumothorax. She presents for chest tube repositioning and replacement. EXAM: IR CATHETER TUBE CHANGE MEDICATIONS: None ANESTHESIA/SEDATION: Fentanyl 50 mcg IV; Versed 1 mg IV Moderate Sedation Time:  16 minutes The patient was continuously monitored during the procedure by the interventional radiology nurse under my direct supervision. COMPLICATIONS: None immediate. FLUOROSCOPY TIME:  1 minute 0 seconds 5 mGy PROCEDURE: Informed written consent was obtained from the patient after a thorough discussion of the procedural risks, benefits and alternatives. All questions were addressed. Maximal Sterile Barrier Technique was utilized including caps, mask, sterile gowns, sterile gloves, sterile drape, hand hygiene and skin antiseptic. A timeout was performed prior to the initiation of the procedure. The existing retention suture was cut and removed. The tube was transected. An Amplatz wire was carefully advanced through the tube and directed to the apex of the right hemithorax. The existing 62 French tube was then removed over the wire. A new sterile 16 French tube was then advanced over the wire and formed in the apex of the right pleural space. The catheter was connected to wall suction and secured to the skin with 0 Prolene suture and an air tight bandage. Follow-up chest x-ray obtained on the fluoroscopy table demonstrates a well-positioned tube an no evidence residual pneumothorax. IMPRESSION: Successful repositioning an exchange for a new 16 French right-sided chest tube. The chest tube  is now positioned at the right lung apex with successful re-expansion of the lung. Electronically Signed   By: Jacqulynn Cadet M.D.   On: 07/20/2017 17:13   Dg Chest Port 1 View  Result Date: 07/22/2017 CLINICAL DATA:  Pneumothorax. EXAM: PORTABLE  CHEST 1 VIEW COMPARISON:  06/20/2017 . FINDINGS: Right chest tube in stable position. Interval improvement of right pneumothorax with mild right apical residual. Left chest tube in stable position. PowerPort catheter stable position Heart size is stable cardiomegaly. No focal infiltrate. Tiny left pleural effusion cannot be excluded. Surgical clips left chest. IMPRESSION: 1. Right chest tube in stable position. Interim improvement of right pneumothorax with mild right apical residual. 2. Left chest tube in stable position. Power port catheter stable position . 3. Tiny left pleural effusion again cannot be excluded . No change from prior exam. Electronically Signed   By: Marcello Moores  Register   On: 07/22/2017 06:14   Dg Chest Port 1 View  Result Date: 07/21/2017 CLINICAL DATA:  Persistent pneumothorax. Chest tube replacement on July 20, 2017 EXAM: PORTABLE CHEST 1 VIEW COMPARISON:  New chest x-ray of July 20, 2017 FINDINGS: The lungs are well-expanded. The pneumothorax on the right has decreased in volume. The pleural line is visible overlying the posterior aspect of the right fourth rib. The pigtail of the small caliber chest tube projects over the lateral aspect of the hemithorax. There is no mediastinal shift. The left lung is clear. The cardiac silhouette is top-normal in size. The pulmonary vascularity is normal. Minimal right basilar atelectasis persists. A tubular project structure projects over the lower left lung and left upper abdomen which may reflect a drainage catheter. IMPRESSION: Decreased volume of the right pleural effusion. The right chest tube is in appropriate position. Electronically Signed   By: David  Martinique M.D.   On: 07/21/2017 07:45   Dg Chest Port 1 View  Result Date: 07/19/2017 CLINICAL DATA:  Right-sided pneumothorax. EXAM: PORTABLE CHEST 1 VIEW COMPARISON:  Radiograph of July 18, 2017. FINDINGS: Stable cardiomediastinal silhouette. Stable position of right-sided chest  tube. Mild right apical pneumothorax is noted and unchanged. Stable position of right internal jugular catheter. Stable mild right basilar subsegmental atelectasis is noted. Left lung is clear. Mild right upper lobe opacity is noted concerning for possible early infiltrate. Bony thorax is unremarkable. IMPRESSION: Stable mild right apical pneumothorax. Stable position of chest tube. Stable mild right basilar subsegmental atelectasis. Mild right upper lobe opacity is noted concerning for possible early infiltrate. Electronically Signed   By: Marijo Conception, M.D.   On: 07/19/2017 07:25   Dg Chest Portable 1 View  Result Date: 07/18/2017 CLINICAL DATA:  Shortness of breath EXAM: PORTABLE CHEST 1 VIEW COMPARISON:  CT 07/18/2017, radiograph 07/13/2017 FINDINGS: Interim placement of a right upper lateral chest tube. Small residual right apical pneumothorax, demonstrating about 12 mm of pleural-parenchymal separation. Right-sided central venous port tip overlies the distal SVC. Probable tiny right effusion and minimal atelectasis at the right base. Stable cardiomediastinal silhouette. Postsurgical changes of the left axilla and breast. IMPRESSION: 1. Placement of a right-sided chest tube with the pigtail visualized over the upper lateral aspect of the right thorax. Small residual right apical pneumothorax. 2. Tiny right effusion and minimal bibasilar atelectasis Electronically Signed   By: Donavan Foil M.D.   On: 07/18/2017 21:19    Labs:  CBC:  Recent Labs  07/06/17 1248 07/12/17 1543 07/18/17 2330  WBC 4.8 6.0 9.0  HGB 14.2 13.7 13.8  HCT 42.3  41.1 40.5  PLT 201 220 210    COAGS: No results for input(s): INR, APTT in the last 8760 hours.  BMP:  Recent Labs  07/06/17 1248 07/12/17 1543 07/18/17 2330  NA 137 135 136  K 4.3 4.0 3.8  CL  --  103 102  CO2 26 24 23   GLUCOSE 89 98 96  BUN 10.5 14 12   CALCIUM 9.4 8.9 8.7*  CREATININE 0.6 0.56 0.41*  GFRNONAA  --  >60 >60  GFRAA  --  >60  >60    LIVER FUNCTION TESTS:  Recent Labs  07/06/17 1248  BILITOT 0.52  AST 18  ALT 16  ALKPHOS 88  PROT 7.1  ALBUMIN 4.0    Assessment and Plan: 81 y.o. female with history of left breast cancer, status post left mastectomy with right chest wall Port-A-Cath placement on 07/13/17 by [facility]. Chest x-ray post Port-A-Cath placement revealed no pneumothorax. Patient presented to ED on 9/24 with progressive dyspnea and follow-up imaging revealed large pneumothorax. A smallbore chest tube was placed by ED staff.Chest x-ray today revealed interval increase in volume of right-sided pneumothorax with chest tube appearing to have been retracted somewhat. Pt had repositioning/exchange of chest tube 9/26; afebrile; O2 sats 95% RA, CXR today with improvement in ptx with mild apical residual; rec cont wall suction to tube, check film in am; if no ptx convert to waterseal and recheck film 24 hrs later; if no ptx remove tube   Electronically Signed: D. Rowe Robert, PA-C 07/22/2017, 11:55 AM   I spent a total of 20 minutes at the the patient's bedside AND on the patient's hospital floor or unit, greater than 50% of which was counseling/coordinating care for right chest tube

## 2017-07-22 NOTE — Progress Notes (Signed)
Oncology brief follow-up note  Patient's hospitalization for pneumothorax noticed, I stop by to see her in the hospital today. I met patient and her husband, and reviewed her surgical pathology findings in details. Due to the size of tumor, if she recovers well, I would recommend adjuvant chemotherapy TCH every 3 weeks, for a total of 4-6 cycles followed by maintenance Herceptin, . We discussed this in details. Patient has had a port placed. I may consider staging CT and bone scan after her hospital discharge.  I'll schedule her follow-up appointment with me around 10/23.   Kathleen Hanson  07/22/2017

## 2017-07-22 NOTE — Progress Notes (Signed)
   07/22/17 1400  Clinical Encounter Type  Visited With Patient  Visit Type Follow-up  Spiritual Encounters  Spiritual Needs Emotional   Following up from a visit on Wednesday.  Nurse Tech was present and following that the patient looked really tired and said she had been trying to nap.  I told that did not hurt my feelings at all and she needed her rest.  Will hopefully follow up later today. Chaplain Katherene Ponto

## 2017-07-22 NOTE — Progress Notes (Signed)
Subjective/Chief Complaint: Complains of feeling tired   Objective: Vital signs in last 24 hours: Temp:  [98 F (36.7 C)-98.6 F (37 C)] 98 F (36.7 C) (09/28 0557) Pulse Rate:  [63-79] 65 (09/28 0557) Resp:  [14-15] 15 (09/28 0557) BP: (155-169)/(71-83) 157/71 (09/28 0557) SpO2:  [95 %-99 %] 95 % (09/28 0557) Last BM Date: 07/20/17  Intake/Output from previous day: 09/27 0701 - 09/28 0700 In: 1080 [P.O.:1080] Out: 2010 [Urine:1925; Drains:25; Chest Tube:60] Intake/Output this shift: No intake/output data recorded.  General appearance: alert and cooperative Resp: clear to auscultation bilaterally Cardio: regular rate and rhythm GI: soft, non-tender; bowel sounds normal; no masses,  no organomegaly  Lab Results:  No results for input(s): WBC, HGB, HCT, PLT in the last 72 hours. BMET No results for input(s): NA, K, CL, CO2, GLUCOSE, BUN, CREATININE, CALCIUM in the last 72 hours. PT/INR No results for input(s): LABPROT, INR in the last 72 hours. ABG No results for input(s): PHART, HCO3 in the last 72 hours.  Invalid input(s): PCO2, PO2  Studies/Results: Dg Chest 2 View  Result Date: 07/21/2017 CLINICAL DATA:  Right pneumothorax followup EXAM: CHEST  2 VIEW COMPARISON:  07/21/2017 FINDINGS: 30-40% right pneumothorax, enlarged from prior. A right pleural drainage pigtail catheter is in place. A left thoracic drain is also in place.  Left axillary clips. Power injectable right IJ Port-A-Cath tip:  SVC. Accounting for the degree of leftward rotation, which is moderate, there is no significant shift of cardiac or mediastinal structures to the left to suggest tension pneumothorax. Slight blunting of the left lateral costophrenic angle. IMPRESSION: 1. Increase in size of right pneumothorax, currently 30-40% of right hemithoracic volume. A right pleural drainage catheter remains in place. No tension component of the pneumothorax seen. 2. Left chest wall drain noted. 3. Thoracic  spondylosis. 4. Slight blunting of the left lateral costophrenic angle, small pleural effusion not excluded. Electronically Signed   By: Van Clines M.D.   On: 07/21/2017 12:09   Dg Chest 2 View  Result Date: 07/20/2017 CLINICAL DATA:  Followup pneumothorax. EXAM: CHEST  2 VIEW COMPARISON:  07/19/2017 FINDINGS: There is a right chest wall port a catheter with tip in the SVC. There is also a right-sided pigtail catheter within the left chest. This appears under advanced as a portion of the pigtail appears outside the chest cavity. There is a moderate right-sided pneumothorax which has increased from the previous exam measuring approximately 5.3 cm in thickness. Previously 1.5 cm. The heart size appears normal. No pleural effusions. Atelectasis noted within the lung bases. IMPRESSION: 1. Interval increase in volume of right-sided pneumothorax. The right chest tube appears to have been fact out and may need to be re-advanced. Electronically Signed   By: Kerby Moors M.D.   On: 07/20/2017 11:26   Ir Catheter Tube Change  Result Date: 07/20/2017 INDICATION: 81 year old female with pneumothorax following port catheter placement. She had a percutaneous chest tube placed recently but the chest tube is become partially displaced and she has a recurrent pneumothorax. She presents for chest tube repositioning and replacement. EXAM: IR CATHETER TUBE CHANGE MEDICATIONS: None ANESTHESIA/SEDATION: Fentanyl 50 mcg IV; Versed 1 mg IV Moderate Sedation Time:  16 minutes The patient was continuously monitored during the procedure by the interventional radiology nurse under my direct supervision. COMPLICATIONS: None immediate. FLUOROSCOPY TIME:  1 minute 0 seconds 5 mGy PROCEDURE: Informed written consent was obtained from the patient after a thorough discussion of the procedural risks, benefits and alternatives.  All questions were addressed. Maximal Sterile Barrier Technique was utilized including caps, mask, sterile  gowns, sterile gloves, sterile drape, hand hygiene and skin antiseptic. A timeout was performed prior to the initiation of the procedure. The existing retention suture was cut and removed. The tube was transected. An Amplatz wire was carefully advanced through the tube and directed to the apex of the right hemithorax. The existing 39 French tube was then removed over the wire. A new sterile 16 French tube was then advanced over the wire and formed in the apex of the right pleural space. The catheter was connected to wall suction and secured to the skin with 0 Prolene suture and an air tight bandage. Follow-up chest x-ray obtained on the fluoroscopy table demonstrates a well-positioned tube an no evidence residual pneumothorax. IMPRESSION: Successful repositioning an exchange for a new 16 French right-sided chest tube. The chest tube is now positioned at the right lung apex with successful re-expansion of the lung. Electronically Signed   By: Jacqulynn Cadet M.D.   On: 07/20/2017 17:13   Dg Chest Port 1 View  Result Date: 07/22/2017 CLINICAL DATA:  Pneumothorax. EXAM: PORTABLE CHEST 1 VIEW COMPARISON:  06/20/2017 . FINDINGS: Right chest tube in stable position. Interval improvement of right pneumothorax with mild right apical residual. Left chest tube in stable position. PowerPort catheter stable position Heart size is stable cardiomegaly. No focal infiltrate. Tiny left pleural effusion cannot be excluded. Surgical clips left chest. IMPRESSION: 1. Right chest tube in stable position. Interim improvement of right pneumothorax with mild right apical residual. 2. Left chest tube in stable position. Power port catheter stable position . 3. Tiny left pleural effusion again cannot be excluded . No change from prior exam. Electronically Signed   By: Marcello Moores  Register   On: 07/22/2017 06:14   Dg Chest Port 1 View  Result Date: 07/21/2017 CLINICAL DATA:  Persistent pneumothorax. Chest tube replacement on July 20, 2017 EXAM: PORTABLE CHEST 1 VIEW COMPARISON:  New chest x-ray of July 20, 2017 FINDINGS: The lungs are well-expanded. The pneumothorax on the right has decreased in volume. The pleural line is visible overlying the posterior aspect of the right fourth rib. The pigtail of the small caliber chest tube projects over the lateral aspect of the hemithorax. There is no mediastinal shift. The left lung is clear. The cardiac silhouette is top-normal in size. The pulmonary vascularity is normal. Minimal right basilar atelectasis persists. A tubular project structure projects over the lower left lung and left upper abdomen which may reflect a drainage catheter. IMPRESSION: Decreased volume of the right pleural effusion. The right chest tube is in appropriate position. Electronically Signed   By: David  Martinique M.D.   On: 07/21/2017 07:45    Anti-infectives: Anti-infectives    None      Assessment/Plan: s/p * No surgery found * Advance diet  Ambulate Continue chest tube at 40cm h2o suction Thoracic surgery agrees with plan  LOS: 4 days    TOTH III,PAUL S 07/22/2017

## 2017-07-22 NOTE — Telephone Encounter (Signed)
Pt called wanting to talk to Dr. Burr Medico to update md what pt had been told by the surgeon.  Pt is currently in the hospital for pneumothorax .  Dr. Burr Medico notified. Pt's   Cell  Phone     [phone].

## 2017-07-23 ENCOUNTER — Encounter (HOSPITAL_COMMUNITY): Payer: Self-pay

## 2017-07-23 ENCOUNTER — Inpatient Hospital Stay (HOSPITAL_COMMUNITY): Payer: Medicare HMO

## 2017-07-23 NOTE — Progress Notes (Signed)
Order placed by PA to place chest tube to water seal, then corrected and ordered to put back to suction. RN has followed orders, pt back to suction at 40

## 2017-07-23 NOTE — Plan of Care (Signed)
Problem: Safety: Goal: Ability to remain free from injury will improve Outcome: Progressing No safety issues noted  Problem: Pain Managment: Goal: General experience of comfort will improve Outcome: Progressing Denies pain  Problem: Physical Regulation: Goal: Will remain free from infection Outcome: Progressing No signs of infection noted

## 2017-07-23 NOTE — Progress Notes (Signed)
Saw Dr. Marlou Starks at [facility] and spoke to him about patient's chest tube.  He would like to keep her to 40cm H20 suction likely through the weekend as she has a persistent air leak and her lung is not stable based off of changes in her suction throughout the week.  Therefore, we will follow along and wait for his recommendations regarding her tube.  Tramane Gorum E

## 2017-07-23 NOTE — Progress Notes (Signed)
Patient ID: Kathleen Hanson, female   DOB: Feb 01, 1936, 81 y.o.   MRN: [id number] [facility] Progress Note:   * No surgery found *  Subjective: Mental status is clear;  Using wash cap on her head.  No complaints Objective: Vital signs in last 24 hours: Temp:  [98 F (36.7 C)-98.7 F (37.1 C)] 98 F (36.7 C) (09/29 0542) Pulse Rate:  [60-74] 60 (09/29 0542) Resp:  [18] 18 (09/29 0542) BP: (150-166)/(61-63) 164/61 (09/29 0542) SpO2:  [97 %] 97 % (09/29 0542)  Intake/Output from previous day: 09/28 0701 - 09/29 0700 In: 240 [P.O.:240] Out: 1020 [Urine:1000; Drains:20] Intake/Output this shift: No intake/output data recorded.  Physical Exam: Work of breathing is not labored.  BS bilaterally;  Chest tube fluctuates and small leak noted intermittently with respirations  Lab Results:  No results found for this or any previous visit (from the past 48 hour(s)).  Radiology/Results: Dg Chest 2 View  Result Date: 07/21/2017 CLINICAL DATA:  Right pneumothorax followup EXAM: CHEST  2 VIEW COMPARISON:  07/21/2017 FINDINGS: 30-40% right pneumothorax, enlarged from prior. A right pleural drainage pigtail catheter is in place. A left thoracic drain is also in place.  Left axillary clips. Power injectable right IJ Port-A-Cath tip:  SVC. Accounting for the degree of leftward rotation, which is moderate, there is no significant shift of cardiac or mediastinal structures to the left to suggest tension pneumothorax. Slight blunting of the left lateral costophrenic angle. IMPRESSION: 1. Increase in size of right pneumothorax, currently 30-40% of right hemithoracic volume. A right pleural drainage catheter remains in place. No tension component of the pneumothorax seen. 2. Left chest wall drain noted. 3. Thoracic spondylosis. 4. Slight blunting of the left lateral costophrenic angle, small pleural effusion not excluded. Electronically Signed   By: Van Clines M.D.   On: 07/21/2017 12:09    Dg Chest Port 1 View  Result Date: 07/23/2017 CLINICAL DATA:  Right pneumothorax.  History of breast cancer. EXAM: PORTABLE CHEST 1 VIEW COMPARISON:  Yesterday FINDINGS: Right-sided chest tube in stable position. Small right apical pneumothorax, stable from before and measuring 2 interspaces. Right IJ central line with tip at the SVC. Postoperative left chest wall with drain. The lungs are clear. No effusion or edema. Normal heart size. IMPRESSION: Stable chest tube positioning and small right apical pneumothorax. Electronically Signed   By: Monte Fantasia M.D.   On: 07/23/2017 07:44   Dg Chest Port 1 View  Result Date: 07/22/2017 CLINICAL DATA:  Pneumothorax. EXAM: PORTABLE CHEST 1 VIEW COMPARISON:  06/20/2017 . FINDINGS: Right chest tube in stable position. Interval improvement of right pneumothorax with mild right apical residual. Left chest tube in stable position. PowerPort catheter stable position Heart size is stable cardiomegaly. No focal infiltrate. Tiny left pleural effusion cannot be excluded. Surgical clips left chest. IMPRESSION: 1. Right chest tube in stable position. Interim improvement of right pneumothorax with mild right apical residual. 2. Left chest tube in stable position. Power port catheter stable position . 3. Tiny left pleural effusion again cannot be excluded . No change from prior exam. Electronically Signed   By: Marcello Moores  Register   On: 07/22/2017 06:14    Anti-infectives: Anti-infectives    None      Assessment/Plan: Problem List: Patient Active Problem List   Diagnosis Date Noted  . Pneumothorax on right 07/18/2017  . Breast cancer of upper-outer quadrant of left female breast ([place]) 07/13/2017  . Malignant neoplasm of upper-outer quadrant of left breast  in female, estrogen receptor positive ([place]) 06/30/2017    Right apical pneumothorax stable on today's xray.  Continue observation with chest tube in place.   * No surgery found *    LOS: 5 days   Matt B.  Hassell Done, MD, [facility] [phone] beeper [phone]  07/23/2017 9:26 AM

## 2017-07-24 ENCOUNTER — Inpatient Hospital Stay (HOSPITAL_COMMUNITY): Payer: Medicare HMO

## 2017-07-24 NOTE — Plan of Care (Signed)
Problem: Safety: Goal: Ability to remain free from injury will improve Outcome: Progressing No fall or injury noted this shift, safety precautions maitainedn  Problem: Pain Managment: Goal: General experience of comfort will improve Outcome: Progressing Denies pain and discomfort  Problem: Physical Regulation: Goal: Will remain free from infection Outcome: Progressing No signs and symptoms of infection noted,   Problem: Activity: Goal: Risk for activity intolerance will decrease Outcome: Progressing Tolerates activities well, ambulates in the room  Problem: Bowel/Gastric: Goal: Will not experience complications related to bowel motility Outcome: Progressing No bowel or gastric issues reported

## 2017-07-24 NOTE — Progress Notes (Signed)
Patient ID: Kathleen Hanson, female   DOB: [birth year], 81 y.o.   MRN: [id number] [facility] Surgery Progress Note:   * No surgery found *  Subjective: Mental status is clear Objective: Vital signs in last 24 hours: Temp:  [98 F (36.7 C)-98.4 F (36.9 C)] 98.1 F (36.7 C) (09/30 0525) Pulse Rate:  [73-77] 76 (09/30 0525) Resp:  [16-18] 16 (09/30 0525) BP: (154-169)/(66-81) 154/81 (09/30 0525) SpO2:  [97 %-98 %] 97 % (09/30 0525)  Intake/Output from previous day: 09/29 0701 - 09/30 0700 In: 360 [P.O.:360] Out: 60 [Drains:40; Chest Tube:20] Intake/Output this shift: No intake/output data recorded.  Physical Exam: Work of breathing is not labored.  No airleak detected to suction.    Lab Results:  No results found for this or any previous visit (from the past 48 hour(s)).  Radiology/Results: Dg Chest Port 1 View  Result Date: 07/23/2017 CLINICAL DATA:  Right pneumothorax.  History of breast cancer. EXAM: PORTABLE CHEST 1 VIEW COMPARISON:  Yesterday FINDINGS: Right-sided chest tube in stable position. Small right apical pneumothorax, stable from before and measuring 2 interspaces. Right IJ central line with tip at the SVC. Postoperative left chest wall with drain. The lungs are clear. No effusion or edema. Normal heart size. IMPRESSION: Stable chest tube positioning and small right apical pneumothorax. Electronically Signed   By: Monte Fantasia M.D.   On: 07/23/2017 07:44    Anti-infectives: Anti-infectives    None      Assessment/Plan: Problem List: Patient Active Problem List   Diagnosis Date Noted  . Pneumothorax on right 07/18/2017  . Breast cancer of upper-outer quadrant of left female breast ([place]) 07/13/2017  . Malignant neoplasm of upper-outer quadrant of left breast in female, estrogen receptor positive ([place]) 06/30/2017    X ray reading pending this morning.  I didn't see obvious pneumothorax.  Would consider placing to waterseal.   * No surgery found *     LOS: 6 days   Matt B. Hassell Done, MD, [facility] [phone] beeper [phone]  07/24/2017 9:39 AM

## 2017-07-25 ENCOUNTER — Ambulatory Visit: Payer: Medicare HMO | Admitting: Hematology

## 2017-07-25 ENCOUNTER — Telehealth: Payer: Self-pay | Admitting: Hematology

## 2017-07-25 ENCOUNTER — Inpatient Hospital Stay (HOSPITAL_COMMUNITY): Payer: Medicare HMO

## 2017-07-25 NOTE — Progress Notes (Signed)
   Subjective/Chief Complaint: Complains about being here. Wants to go home   Objective: Vital signs in last 24 hours: Temp:  [98.4 F (36.9 C)-98.6 F (37 C)] 98.6 F (37 C) (10/01 1400) Pulse Rate:  [67-84] 84 (10/01 1400) Resp:  [15-17] 17 (10/01 1400) BP: (156-169)/(77-81) 169/78 (10/01 1400) SpO2:  [97 %-98 %] 98 % (10/01 1400) Last BM Date: 07/23/17  Intake/Output from previous day: 09/30 0701 - 10/01 0700 In: 1110 [P.O.:1080] Out: 82.5 [Drains:82.5] Intake/Output this shift: Total I/O In: 480 [P.O.:480] Out: 20 [Drains:20]  General appearance: alert and cooperative Resp: clear to auscultation bilaterally Cardio: regular rate and rhythm GI: soft, non-tender; bowel sounds normal; no masses,  no organomegaly  Lab Results:  No results for input(s): WBC, HGB, HCT, PLT in the last 72 hours. BMET No results for input(s): NA, K, CL, CO2, GLUCOSE, BUN, CREATININE, CALCIUM in the last 72 hours. PT/INR No results for input(s): LABPROT, INR in the last 72 hours. ABG No results for input(s): PHART, HCO3 in the last 72 hours.  Invalid input(s): PCO2, PO2  Studies/Results: Dg Chest 1 View  Result Date: 07/24/2017 CLINICAL DATA:  Follow-up right pneumothorax. EXAM: CHEST 1 VIEW COMPARISON:  07/23/2017 and prior studies FINDINGS: A small right apical pneumothorax is unchanged -5-10%. A right thoracostomy tube is again identified. Upper limits normal heart size and right Port-A-Cath are again identified. There is no evidence of airspace disease or pleural effusion. IMPRESSION: Unchanged appearance of the chest with small stable right apical pneumothorax and right thoracostomy tube. Electronically Signed   By: Margarette Canada M.D.   On: 07/24/2017 11:45   Dg Chest Port 1 View  Result Date: 07/25/2017 CLINICAL DATA:  81 year old female with breast cancer post nephrectomy. Former smoker. Pneumothorax. Subsequent encounter. EXAM: PORTABLE CHEST 1 VIEW COMPARISON:  07/24/2017 FINDINGS:  Right apical 5-10% pneumothorax without change. Right pigtail catheter chest tube unchanged in position. Right central line tip mid distal superior vena cava level. Post left mastectomy and axillary lymph node dissection. Heart size top-normal. IMPRESSION: Similar appearance of right apical pneumothorax (approximately 5-10%) with right-sided chest tube unchanged in position. Electronically Signed   By: Genia Del M.D.   On: 07/25/2017 07:11    Anti-infectives: Anti-infectives    None      Assessment/Plan: s/p * No surgery found * Advance diet  Pneumothorax after port Reduce suction to 20cm h2o today. Recheck cxr in am D/c jp ambulate  LOS: 7 days    TOTH III,PAUL S 07/25/2017

## 2017-07-25 NOTE — Telephone Encounter (Signed)
Per 9/28 schedule message cancelled appointments for today and rescheduled for 10/23. Left message for patient.

## 2017-07-25 NOTE — Progress Notes (Signed)
   07/25/17 1600  Clinical Encounter Type  Visited With Patient  Visit Type Follow-up  Spiritual Encounters  Spiritual Needs Emotional  Stress Factors  Patient Stress Factors Health changes   Following up from a visit on Friday since the patient was still here.  Opportunity to listen as she shared some of her journey and what she is going through now and how she feel.s   Ready to get back home and back to active lifestyle.  Will follow as needed. Chaplain Katherene Ponto

## 2017-07-25 NOTE — Plan of Care (Signed)
Problem: Safety: Goal: Ability to remain free from injury will improve Outcome: Progressing No safety issues reported, safety precautions maintaine  Problem: Pain Managment: Goal: General experience of comfort will improve Outcome: Progressing Denies pain and discomfort  Problem: Physical Regulation: Goal: Will remain free from infection Outcome: Progressing No signs or symptoms of infection noted  Problem: Skin Integrity: Goal: Risk for impaired skin integrity will decrease Outcome: Progressing No skin issues reported  Problem: Tissue Perfusion: Goal: Risk factors for ineffective tissue perfusion will decrease Outcome: Progressing Denies signs of dvt  Problem: Activity: Goal: Risk for activity intolerance will decrease Outcome: Progressing tolerates activities well  Problem: Bowel/Gastric: Goal: Will not experience complications related to bowel motility Outcome: Progressing No bowel issues noted

## 2017-07-26 ENCOUNTER — Inpatient Hospital Stay (HOSPITAL_COMMUNITY): Payer: Medicare HMO

## 2017-07-26 ENCOUNTER — Encounter: Payer: Self-pay | Admitting: General Practice

## 2017-07-26 NOTE — Progress Notes (Signed)
[facility] Spiritual Care Note  Referred by chaplain Katherene Ponto for spiritual and emotional support from the [facility] side of pt's care.  Met with Kathleen Hanson and her husband Kathleen Hanson for >30 min in her inpt room.  She used the opportunity to reflect on the pneumothorax as an unexpected setback in her process of beginning chemo tx.  She is frustrated at becoming behind in other life tasks/routines and is eager to return home (without any more setbacks!). Couple reports good support from family, friends, and faith community ([place]).  Brought my card and a prayer shawl as a tangible sign of comfort, encouraging Paloma and/or Kathleen Hanson to contact Support Team whenever desired.     [place], [place], [facility] M-F daytime pager [phone] [facility], [facility], [facility] 24/7 pager [phone] Voicemail [phone]

## 2017-07-26 NOTE — Progress Notes (Signed)
   Subjective/Chief Complaint: No complaints   Objective: Vital signs in last 24 hours: Temp:  [98.6 F (37 C)] 98.6 F (37 C) (10/02 0445) Pulse Rate:  [67-84] 67 (10/02 0445) Resp:  [17-18] 18 (10/02 0445) BP: (152-169)/(78-88) 152/88 (10/02 0445) SpO2:  [97 %-99 %] 97 % (10/02 0445) Last BM Date: 07/25/17  Intake/Output from previous day: 10/01 0701 - 10/02 0700 In: 1020 [P.O.:1020] Out: 420 [Urine:400; Drains:20] Intake/Output this shift: No intake/output data recorded.  General appearance: alert and cooperative Resp: clear to auscultation bilaterally Cardio: regular rate and rhythm GI: soft, non-tender; bowel sounds normal; no masses,  no organomegaly  Lab Results:  No results for input(s): WBC, HGB, HCT, PLT in the last 72 hours. BMET No results for input(s): NA, K, CL, CO2, GLUCOSE, BUN, CREATININE, CALCIUM in the last 72 hours. PT/INR No results for input(s): LABPROT, INR in the last 72 hours. ABG No results for input(s): PHART, HCO3 in the last 72 hours.  Invalid input(s): PCO2, PO2  Studies/Results: Dg Chest Port 1 View  Result Date: 07/26/2017 CLINICAL DATA:  Recent pneumothorax. EXAM: PORTABLE CHEST 1 VIEW COMPARISON:  July 25, 2017 FINDINGS: Port-A-Cath tip is in the superior vena cava. Chest tube on the right is unchanged in position. There is a rather minimal apical pneumothorax, marginally smaller than 1 day prior. Chest tube is been removed from the left side without apparent pneumothorax. There is a small right pleural effusion. There is no edema or consolidation. Heart is upper normal in size with pulmonary vascularity within normal limits. No adenopathy. There are surgical clips in the left axillary region along the left lateral hemithorax. There is degenerative change in the thoracic spine. IMPRESSION: Tube and catheter positions as described. Minimal pneumothorax on the right without tension component. Pneumothorax the right appears slightly smaller  compared to 1 day prior. Small right pleural effusion evident. No edema or consolidation. Stable cardiac silhouette. Electronically Signed   By: Lowella Grip III M.D.   On: 07/26/2017 07:08   Dg Chest Port 1 View  Result Date: 07/25/2017 CLINICAL DATA:  81 year old female with breast cancer post nephrectomy. Former smoker. Pneumothorax. Subsequent encounter. EXAM: PORTABLE CHEST 1 VIEW COMPARISON:  07/24/2017 FINDINGS: Right apical 5-10% pneumothorax without change. Right pigtail catheter chest tube unchanged in position. Right central line tip mid distal superior vena cava level. Post left mastectomy and axillary lymph node dissection. Heart size top-normal. IMPRESSION: Similar appearance of right apical pneumothorax (approximately 5-10%) with right-sided chest tube unchanged in position. Electronically Signed   By: Genia Del M.D.   On: 07/25/2017 07:11    Anti-infectives: Anti-infectives    None      Assessment/Plan: s/p * No surgery found * Advance diet  Chest tube to water seal today. Repeat cxr in am. If good then will d/c chest tube tomorrow and discharge ambulate  LOS: 8 days    TOTH III,PAUL S 07/26/2017

## 2017-07-27 ENCOUNTER — Inpatient Hospital Stay (HOSPITAL_COMMUNITY): Payer: Medicare HMO

## 2017-07-27 ENCOUNTER — Other Ambulatory Visit: Payer: Medicare HMO

## 2017-07-27 NOTE — Progress Notes (Signed)
   07/27/17 1500  Clinical Encounter Type  Visited With Patient and family together  Visit Type Follow-up  Spiritual Encounters  Spiritual Needs Emotional   Followed up from visit on 10.1  Patient indicated she was going home and very glad of it.  Husband was present and glad his wife was going home.  Supported the patient gave a listening ear. Chaplain Katherene Ponto

## 2017-07-27 NOTE — Discharge Summary (Signed)
Physician Discharge Summary  Patient ID: Kathleen Hanson MRN: [id number] DOB/AGE: 01/11/1936 81 y.o.  Admit date: 07/18/2017 Discharge date: 07/27/2017  Admission Diagnoses:  Discharge Diagnoses:  Active Problems:   Pneumothorax on right   Discharged Condition: good  Hospital Course: the patient was admitted with a pneumothorax several days after a port was placed although her post procedure cxr was normal. She had a chest tube placed but had a persistent air leak. The pleurivac was placed to 40 cm h2o suction for several days and the pneumothorax finally resolved. She is ready for discharge home  Consults: None  Significant Diagnostic Studies: none  Treatments: surgery: as above  Discharge Exam: Blood pressure (!) 154/62, pulse (!) 59, temperature 98.1 F (36.7 C), temperature source Oral, resp. rate 16, height 5\' 4"  (1.626 m), weight 59 kg (130 lb), SpO2 99 %. General appearance: alert and cooperative Resp: clear to auscultation bilaterally Cardio: regular rate and rhythm GI: soft, non-tender; bowel sounds normal; no masses,  no organomegaly  Disposition: 01-Home or Self Care  Discharge Instructions    Call MD for:  difficulty breathing, headache or visual disturbances    Complete by:  As directed    Call MD for:  extreme fatigue    Complete by:  As directed    Call MD for:  hives    Complete by:  As directed    Call MD for:  persistant dizziness or light-headedness    Complete by:  As directed    Call MD for:  persistant nausea and vomiting    Complete by:  As directed    Call MD for:  redness, tenderness, or signs of infection (pain, swelling, redness, odor or green/yellow discharge around incision site)    Complete by:  As directed    Call MD for:  severe uncontrolled pain    Complete by:  As directed    Call MD for:  temperature >100.4    Complete by:  As directed    Diet - low sodium heart healthy    Complete by:  As directed    Discharge instructions     Complete by:  As directed    May shower. Diet and activity as tolerated. May begin stretching overhead   Increase activity slowly    Complete by:  As directed    No wound care    Complete by:  As directed      Allergies as of 07/27/2017   No Known Allergies     Medication List    TAKE these medications   aspirin 81 MG chewable tablet Chew 81 mg by mouth daily.   cholecalciferol 1000 units tablet Commonly known as:  VITAMIN D Take 1,000 Units by mouth daily.   HYDROcodone-acetaminophen 5-325 MG tablet Commonly known as:  NORCO/VICODIN Take 1-2 tablets by mouth every 4 (four) hours as needed for moderate pain.   multivitamin with minerals Tabs tablet Take 1 tablet by mouth daily.   polyethylene glycol powder powder Commonly known as:  GLYCOLAX/MIRALAX MIX 17 GRAMS IN LIQUID AND DRINK DAILY IN THE EVENING   pravastatin 40 MG tablet Commonly known as:  PRAVACHOL Take 40 mg by mouth daily.   vitamin C 500 MG tablet Commonly known as:  ASCORBIC ACID Take 500 mg by mouth daily.      Follow-up Information    Autumn Messing III, MD Follow up in 2 week(s).   Specialty:  General Surgery Contact information: [address]  [place]           Signed: [name] S 07/27/2017, 1:19 PM

## 2017-07-27 NOTE — Progress Notes (Signed)
Called to possibly remove chest tube, after assessing and collaborating with [facility] RN, chest tube should be removed by MD or PA because of chest tube being smaller bore and being placed under IR originally. Mary RN notified IR for a physician to remove chest tube. Placed temporary dressing on top of site.

## 2017-07-27 NOTE — Progress Notes (Signed)
   Subjective/Chief Complaint: No complaints   Objective: Vital signs in last 24 hours: Temp:  [98.1 F (36.7 C)-98.9 F (37.2 C)] 98.1 F (36.7 C) (10/03 0550) Pulse Rate:  [59-78] 59 (10/03 0550) Resp:  [16-18] 16 (10/03 0550) BP: (154-185)/(62-105) 154/62 (10/03 0550) SpO2:  [98 %-99 %] 99 % (10/03 0550) Last BM Date: 07/25/17  Intake/Output from previous day: 10/02 0701 - 10/03 0700 In: 600 [P.O.:600] Out: 600 [Urine:600] Intake/Output this shift: No intake/output data recorded.  General appearance: alert and cooperative Resp: clear to auscultation bilaterally Cardio: regular rate and rhythm GI: soft, non-tender; bowel sounds normal; no masses,  no organomegaly  Lab Results:  No results for input(s): WBC, HGB, HCT, PLT in the last 72 hours. BMET No results for input(s): NA, K, CL, CO2, GLUCOSE, BUN, CREATININE, CALCIUM in the last 72 hours. PT/INR No results for input(s): LABPROT, INR in the last 72 hours. ABG No results for input(s): PHART, HCO3 in the last 72 hours.  Invalid input(s): PCO2, PO2  Studies/Results: Dg Chest Port 1 View  Result Date: 07/27/2017 CLINICAL DATA:  Pneumothorax. EXAM: PORTABLE CHEST 1 VIEW COMPARISON:  07/26/2017 FINDINGS: Right jugular Port-A-Cath terminates over the mid SVC. Right-sided chest tube is unchanged. The cardiac silhouette is upper limits of normal in size. Surgical clips are noted in the left axilla and along the left lateral chest wall. A tiny right apical pneumothorax is stable to slightly smaller than on the prior study. The lungs are well inflated. No airspace consolidation or edema is seen. A trace right pleural effusion is again noted. IMPRESSION: Persistent tiny right apical pneumothorax and trace right pleural effusion. Electronically Signed   By: Logan Bores M.D.   On: 07/27/2017 07:07   Dg Chest Port 1 View  Result Date: 07/26/2017 CLINICAL DATA:  Recent pneumothorax. EXAM: PORTABLE CHEST 1 VIEW COMPARISON:   July 25, 2017 FINDINGS: Port-A-Cath tip is in the superior vena cava. Chest tube on the right is unchanged in position. There is a rather minimal apical pneumothorax, marginally smaller than 1 day prior. Chest tube is been removed from the left side without apparent pneumothorax. There is a small right pleural effusion. There is no edema or consolidation. Heart is upper normal in size with pulmonary vascularity within normal limits. No adenopathy. There are surgical clips in the left axillary region along the left lateral hemithorax. There is degenerative change in the thoracic spine. IMPRESSION: Tube and catheter positions as described. Minimal pneumothorax on the right without tension component. Pneumothorax the right appears slightly smaller compared to 1 day prior. Small right pleural effusion evident. No edema or consolidation. Stable cardiac silhouette. Electronically Signed   By: Lowella Grip III M.D.   On: 07/26/2017 07:08    Anti-infectives: Anti-infectives    None      Assessment/Plan: s/p * No surgery found * Advance diet  D/c chest tube Discharge today  LOS: 9 days    TOTH III,Lielle Vandervort S 07/27/2017

## 2017-07-27 NOTE — Progress Notes (Signed)
Paged Dr. Marlou Starks concerning order for chest tube removal and if sutures should be left in and used to close.  Dr. Marlou Starks stated to remove sutures, cut tube and remove since tube is curled, then apply dressing.  Called ICU, Christian RN to floor to help.  Stated to call Radiology since they are the ones who placed tube.  Spoke with Interventional radiology concerning chest tube removal.  Stated to page Rowe Robert, PA for questions.  Spoke with Lennette Bihari who stated that he will come up to floor to remove chest tube and that patient will need a chest xray post removal per PA.

## 2017-07-27 NOTE — Discharge Instructions (Signed)
Dressing Changes: Change dressing to right flank daily for 48-72 hours or until wound is closed. Remove tape and gauze.  Check incision under petrolium gauze for increased redness, swelling, foul smelling or looking drainage from chest tube site. If you develop a fever of 100.4 or greater or any other symptoms of infection please call the office immediately.  Keep petrolium gauze on incision, cover with gauze, and secure with tape.        Pneumothorax A pneumothorax, commonly called a collapsed lung, is a condition in which air leaks from a lung and builds up in the space between the lung and the chest wall (pleural space). The air in a pneumothorax is trapped outside the lung and takes up space, preventing the lung from fully expanding. This is a condition that usually occurs suddenly. The buildup of air may be small or large. A small pneumothorax may go away on its own. When a pneumothorax is larger, it will often require medical treatment and hospitalization. What are the causes? A pneumothorax can sometimes happen quickly with no apparent cause. People with underlying lung problems, particularly COPD or emphysema, are at higher risk of pneumothorax. However, pneumothorax can happen quickly even in people with no prior known lung problems. Trauma, surgery, medical procedures, or injury to the chest wall can also cause a pneumothorax. What are the signs or symptoms? Sometimes a pneumothorax will have no symptoms. When symptoms are present, they can include:  Chest pain.  Shortness of breath.  Increased rate of breathing.  Bluish color to your lips or skin (cyanosis).  How is this diagnosed? Pneumothorax is usually diagnosed by a chest X-ray or chest CT scan. Your health care provider will also take a medical history and perform a physical exam to determine why you may have a pneumothorax. How is this treated? A small pneumothorax may go away on its own without treatment. Extra oxygen can  sometimes help a small pneumothorax go away more quickly. For a larger pneumothorax or a pneumothorax that is causing symptoms, a procedure is usually needed to drain the air.In some cases, the health care provider may drain the air using a needle. In other cases, a chest tube may be inserted into the pleural space. A chest tube is a small tube placed between the ribs and into the pleural space. This removes the extra air and allows the lung to expand back to its normal size. A large pneumothorax will usually require a hospital stay. If there is ongoing air leakage into the pleural space, then the chest tube may need to remain in place for several days until the air leak has healed. In some cases, surgery may be needed. Follow these instructions at home:  Only take over-the-counter or prescription medicines as directed by your health care provider.  If a cough or pain makes it difficult for you to sleep at night, try sleeping in a semi-upright position in a recliner or by using 2 or 3 pillows.  Rest and limit activity as directed by your health care provider.  If you had a chest tube and it was removed, ask your health care provider when it is okay to remove the dressing. Until your health care provider says you can remove the dressing, do not allow it to get wet.  Do not smoke. Smoking is a risk factor for pneumothorax.  Do not fly in an airplane or scuba dive until your health care provider says it is okay.  Follow up with your  health care provider as directed. Get help right away if:  You have increasing chest pain or shortness of breath.  You have a cough that is not controlled with suppressants.  You begin coughing up blood.  You have pain that is getting worse or is not controlled with medicines.  You cough up thick, discolored mucus (sputum) that is yellow to green in color.  You have redness, increasing pain, or discharge at the site where a chest tube had been in place (if your  pneumothorax was treated with a chest tube).  The site where your chest tube was located opens up.  You feel air coming out of the site where the chest tube was placed.  You have a fever or persistent symptoms for more than 2-3 days.  You have a fever and your symptoms suddenly get worse. This information is not intended to replace advice given to you by your health care provider. Make sure you discuss any questions you have with your health care provider. Document Released: 10/11/2005 Document Revised: 03/18/2016 Document Reviewed: 03/06/2014 Elsevier Interactive Patient Education  2018 [employer].   Chest Tube Insertion, Adult A chest tube is a thin, flexible tube that is inserted into the space between your lung and your chest wall. You may need a chest tube if you have a collapsed lung from illness or a severe injury. A collapsed lung can be caused by:  An air leak (pneumothorax).  Blood collection (hemothorax).  Fluid buildup from an infection (empyema).  The chest tube drains the fluid or air from your lung. It may be attached to a suction device to help with drainage. You will need to stay in the hospital while the chest tube is in place. Tell a health care provider about:  Any allergies you have.  All medicines you are taking, including vitamins, herbs, eye drops, creams, and over-the-counter medicines.  Any problems you or family members have had with anesthetic medicines.  Any blood disorders you have.  Any surgeries you have had.  Any medical conditions you have, including any recent fever or cold symptoms.  Whether you are pregnant or may be pregnant. What are the risks? Generally, this is a safe procedure. However, problems may occur, including:  Bleeding.  Infection.  Allergic reaction to medicines.  Lung damage.  Damage to the blood vessels or nerves near the lung.  Failure of the chest tube to work properly.  What happens before the  procedure? Staying hydrated Follow instructions from your health care provider about hydration, which may include:  Up to 2 hours before the procedure - you may continue to drink clear liquids, such as water, clear fruit juice, black coffee, and plain tea.  Eating and drinking restrictions Follow instructions from your health care provider about eating and drinking, which may include:  8 hours before the procedure - stop eating heavy meals or foods such as meat, fried foods, or fatty foods.  6 hours before the procedure - stop eating light meals or foods, such as toast or cereal.  6 hours before the procedure - stop drinking milk or drinks that contain milk.  2 hours before the procedure - stop drinking clear liquids.  Medicines  Ask your health care provider about: ? Changing or stopping your regular medicines. This is especially important if you are taking diabetes medicines or blood thinners. ? Taking medicines such as aspirin and ibuprofen. These medicines can thin your blood. Do not take these medicines before your procedure  if your health care provider instructs you not to. General instructions  You will have a chest X-ray or other imaging studies of the lung.  Plan to have someone take you home from the hospital or clinic.  If you will be going home right after the procedure, plan to have someone with you for 24 hours. What happens during the procedure?  To reduce your risk of infection: ? Your health care team will wash or sanitize their hands. ? Your skin will be washed with soap. ? Hair may be removed from the surgical area.  An IV tube will be inserted into one of your veins.  You will be given one or more of the following: ? A medicine to help you relax (sedative). ? A medicine to numb the area (local anesthetic).  You may be given antibiotic and pain medicines through the IV tube.  The surgeon will make a small incision in a space between your ribs.  The  chest tube will be placed through the incision into the space between the lung and your chest wall.  Stitches (sutures) will be used to close the incision around the tube.  The chest tube may be attached to a suction device.  The incision site will be covered with an airtight bandage (dressing).  Another chest X-ray will be done to check the position of the tube. The procedure may vary among health care providers and hospitals. What happens after the procedure?  Your blood pressure, heart rate, breathing rate, and blood oxygen level will be monitored until the medicines you were given have worn off.  You may continue to get pain medicine or antibiotics through the IV tube.  The tube and dressing will be checked regularly.  You will be encouraged to cough and take deep breaths.  You may be given oxygen to breathe.  Chest X-rays will be done to find out if the lung is inflating. After the lung is inflated: ? Another chest X-ray may be done. ? The chest tube can be removed after the lung remains inflated and you are breathing easily. ? A new dressing will be put on.  Do not drive for 24 hours if you were given a sedative. This information is not intended to replace advice given to you by your health care provider. Make sure you discuss any questions you have with your health care provider. Document Released: 01/19/2007 Document Revised: 04/30/2016 Document Reviewed: 03/24/2016 Elsevier Interactive Patient Education  2018 [place]. Moderate Conscious Sedation, Adult, Care After These instructions provide you with information about caring for yourself after your procedure. Your health care provider may also give you more specific instructions. Your treatment has been planned according to current medical practices, but problems sometimes occur. Call your health care provider if you have any problems or questions after your procedure. What can I expect after the procedure? After your  procedure, it is common:  To feel sleepy for several hours.  To feel clumsy and have poor balance for several hours.  To have poor judgment for several hours.  To vomit if you eat too soon.  Follow these instructions at home: For at least 24 hours after the procedure:   Do not: ? Participate in activities where you could fall or become injured. ? Drive. ? Use heavy machinery. ? Drink alcohol. ? Take sleeping pills or medicines that cause drowsiness. ? Make important decisions or sign legal documents. ? Take care of children on your own.  Rest. Eating  and drinking  Follow the diet recommended by your health care provider.  If you vomit: ? Drink water, juice, or soup when you can drink without vomiting. ? Make sure you have little or no nausea before eating solid foods. General instructions  Have a responsible adult stay with you until you are awake and alert.  Take over-the-counter and prescription medicines only as told by your health care provider.  If you smoke, do not smoke without supervision.  Keep all follow-up visits as told by your health care provider. This is important. Contact a health care provider if:  You keep feeling nauseous or you keep vomiting.  You feel light-headed.  You develop a rash.  You have a fever. Get help right away if:  You have trouble breathing. This information is not intended to replace advice given to you by your health care provider. Make sure you discuss any questions you have with your health care provider. Document Released: 08/01/2013 Document Revised: 03/15/2016 Document Reviewed: 01/31/2016 Elsevier Interactive Patient Education  [employer].

## 2017-07-27 NOTE — Progress Notes (Signed)
Referring Physician(s): Toth,P  Supervising Physician: Sandi Mariscal  Patient Status:  [facility] - In-pt  Chief Complaint: Right pneumothorax   Subjective: Pt doing well; denies sig CP, worsening dyspnea; awaiting d/c home; request received for rt chest tube removal   Allergies: Patient has no known allergies.  Medications: Prior to Admission medications   Medication Sig Start Date End Date Taking? Authorizing Provider  aspirin 81 MG chewable tablet Chew 81 mg by mouth daily.    Yes [provider]  cholecalciferol (VITAMIN D) 1000 units tablet Take 1,000 Units by mouth daily.   Yes [provider]  HYDROcodone-acetaminophen (NORCO/VICODIN) 5-325 MG tablet Take 1-2 tablets by mouth every 4 (four) hours as needed for moderate pain. 07/14/17  Yes [name], MD  Multiple Vitamin (MULTIVITAMIN WITH MINERALS) TABS tablet Take 1 tablet by mouth daily.   Yes [provider]  polyethylene glycol powder (GLYCOLAX/MIRALAX) powder MIX 17 GRAMS IN LIQUID AND DRINK DAILY IN THE EVENING 03/26/16  Yes [provider]  pravastatin (PRAVACHOL) 40 MG tablet Take 40 mg by mouth daily.   Yes [provider]  vitamin C (ASCORBIC ACID) 500 MG tablet Take 500 mg by mouth daily.   Yes [provider]     Vital Signs: BP (!) 154/62 (BP Location: Right Leg)   Pulse (!) 59   Temp 98.1 F (36.7 C) (Oral)   Resp 16   Ht 5\' 4"  (1.626 m)   Wt 130 lb (59 kg)   SpO2 99%   BMI 22.31 kg/m   Physical Exam awake/alert; rt chest tube intact, insertion site ok, to water seal  Imaging: Dg Chest 1 View  Result Date: 07/24/2017 CLINICAL DATA:  Follow-up right pneumothorax. EXAM: CHEST 1 VIEW COMPARISON:  07/23/2017 and prior studies FINDINGS: A small right apical pneumothorax is unchanged -5-10%. A right thoracostomy tube is again identified. Upper limits normal heart size and right Port-A-Cath are again identified. There is no evidence of airspace disease  or pleural effusion. IMPRESSION: Unchanged appearance of the chest with small stable right apical pneumothorax and right thoracostomy tube. Electronically Signed   By: Margarette Canada M.D.   On: 07/24/2017 11:45   Dg Chest Port 1 View  Result Date: 07/27/2017 CLINICAL DATA:  Pneumothorax. EXAM: PORTABLE CHEST 1 VIEW COMPARISON:  07/26/2017 FINDINGS: Right jugular Port-A-Cath terminates over the mid SVC. Right-sided chest tube is unchanged. The cardiac silhouette is upper limits of normal in size. Surgical clips are noted in the left axilla and along the left lateral chest wall. A tiny right apical pneumothorax is stable to slightly smaller than on the prior study. The lungs are well inflated. No airspace consolidation or edema is seen. A trace right pleural effusion is again noted. IMPRESSION: Persistent tiny right apical pneumothorax and trace right pleural effusion. Electronically Signed   By: Logan Bores M.D.   On: 07/27/2017 07:07   Dg Chest Port 1 View  Result Date: 07/26/2017 CLINICAL DATA:  Recent pneumothorax. EXAM: PORTABLE CHEST 1 VIEW COMPARISON:  July 25, 2017 FINDINGS: Port-A-Cath tip is in the superior vena cava. Chest tube on the right is unchanged in position. There is a rather minimal apical pneumothorax, marginally smaller than 1 day prior. Chest tube is been removed from the left side without apparent pneumothorax. There is a small right pleural effusion. There is no edema or consolidation. Heart is upper normal in size with pulmonary vascularity within normal limits. No adenopathy. There are surgical clips in the  left axillary region along the left lateral hemithorax. There is degenerative change in the thoracic spine. IMPRESSION: Tube and catheter positions as described. Minimal pneumothorax on the right without tension component. Pneumothorax the right appears slightly smaller compared to 1 day prior. Small right pleural effusion evident. No edema or consolidation. Stable cardiac  silhouette. Electronically Signed   By: Lowella Grip III M.D.   On: 07/26/2017 07:08   Dg Chest Port 1 View  Result Date: 07/25/2017 CLINICAL DATA:  81 year old female with breast cancer post nephrectomy. Former smoker. Pneumothorax. Subsequent encounter. EXAM: PORTABLE CHEST 1 VIEW COMPARISON:  07/24/2017 FINDINGS: Right apical 5-10% pneumothorax without change. Right pigtail catheter chest tube unchanged in position. Right central line tip mid distal superior vena cava level. Post left mastectomy and axillary lymph node dissection. Heart size top-normal. IMPRESSION: Similar appearance of right apical pneumothorax (approximately 5-10%) with right-sided chest tube unchanged in position. Electronically Signed   By: Genia Del M.D.   On: 07/25/2017 07:11    Labs:  CBC:  Recent Labs  07/06/17 1248 07/12/17 1543 07/18/17 2330  WBC 4.8 6.0 9.0  HGB 14.2 13.7 13.8  HCT 42.3 41.1 40.5  PLT 201 220 210    COAGS: No results for input(s): INR, APTT in the last 8760 hours.  BMP:  Recent Labs  07/06/17 1248 07/12/17 1543 07/18/17 2330  NA 137 135 136  K 4.3 4.0 3.8  CL  --  103 102  CO2 26 24 23   GLUCOSE 89 98 96  BUN 10.5 14 12   CALCIUM 9.4 8.9 8.7*  CREATININE 0.6 0.56 0.41*  GFRNONAA  --  >60 >60  GFRAA  --  >60 >60    LIVER FUNCTION TESTS:  Recent Labs  07/06/17 1248  BILITOT 0.52  AST 18  ALT 16  ALKPHOS 88  PROT 7.1  ALBUMIN 4.0    Assessment and Plan: Pt with hx left breast cancer, s/p left mastectomy with rt chest wall port a cath placement 07/13/17 by [facility] with delayed pneumothorax requiring chest tube placement, initially by ED MD on 9/24 and exchanged out by IR on 9/26; CXR today with stable, persistent but tiny rt apical ptx; request received from Dr. Marlou Starks for chest tube removal; tube removed in its entirety without immediate complications; vaseline gauze dressing applied to site; f/u CXR pend.   Electronically Signed: D. Rowe Robert,  PA-C 07/27/2017, 2:34 PM   I spent a total of 20 minutes at the the patient's bedside AND on the patient's hospital floor or unit, greater than 50% of which was counseling/coordinating care for right chest tube    Patient ID: Kathleen Hanson, female   DOB: Jan 05, 1936, 81 y.o.   MRN: [id number]

## 2017-07-27 NOTE — Progress Notes (Signed)
Patient has met criteria for discharge.  D/C instructions given to patient.  Copy signed and placed in front of chart. Verbalized understanding. Dressings and tape given to patient.  Denies any further questions or concerns.  Patient told to call office tomorrow to set up two week follow up appointment. Patient discharged via wheelchair with husband.

## 2017-07-27 NOTE — Care Management Important Message (Signed)
Important Message  Patient Details  Name: Kathleen Hanson MRN: [id number] Date of Birth: 06/15/1936   Medicare Important Message Given:  Yes    Kerin Salen 07/27/2017, 11:12 AMImportant Message  Patient Details  Name: Kathleen Hanson MRN: [id number] Date of Birth: 06/26/1936   Medicare Important Message Given:  Yes    Kerin Salen 07/27/2017, 11:12 AM

## 2017-08-01 ENCOUNTER — Other Ambulatory Visit: Payer: Medicare HMO

## 2017-08-01 ENCOUNTER — Encounter: Payer: Self-pay | Admitting: Genetics

## 2017-08-01 ENCOUNTER — Ambulatory Visit (HOSPITAL_BASED_OUTPATIENT_CLINIC_OR_DEPARTMENT_OTHER): Payer: Medicare HMO | Admitting: Genetics

## 2017-08-01 DIAGNOSIS — C50412 Malignant neoplasm of upper-outer quadrant of left female breast: Secondary | ICD-10-CM | POA: Diagnosis not present

## 2017-08-01 DIAGNOSIS — Z803 Family history of malignant neoplasm of breast: Secondary | ICD-10-CM | POA: Diagnosis not present

## 2017-08-01 DIAGNOSIS — Z170 Estrogen receptor positive status [ER+]: Secondary | ICD-10-CM | POA: Diagnosis not present

## 2017-08-01 NOTE — Progress Notes (Signed)
REFERRING PROVIDER: [name], MD [place], [place] [address]  PRIMARY PROVIDER:  Beam, Russ Halo, PhD  PRIMARY REASON FOR VISIT:  1. Malignant neoplasm of upper-outer quadrant of left breast in female, estrogen receptor positive ([place])   2. Family history of breast cancer     HISTORY OF PRESENT ILLNESS:   Kathleen Hanson, a 81 y.o. female, was seen for a [facility] cancer genetics consultation at the request of Dr. Sondra Come due to a personal and family history of cancer.  Kathleen Hanson presents to clinic today to discuss the possibility of a hereditary predisposition to cancer, genetic testing, and to further clarify her future cancer risks, as well as potential cancer risks for family members.   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  Recently in Sept 2018, Kathleen Hanson has a new diagnosis of Invasive mammary carcinoma   of the left breast. She had a left mastectomy on 07/13/2017.    CANCER HISTORY:    Malignant neoplasm of upper-outer quadrant of left breast in female, estrogen receptor positive ([place])   06/23/2017 Mammogram    Diagnostic mammogram 06/23/17 IMPRESSION: Indeterminate mass with irregular margins at the 2:30 position 4 cm from nipple measuring 1.4 x 0.8 x 1.2 cm in the upper-outer left breast.      06/28/2017 Initial Biopsy    Diagnosis 06/28/17 Breast, left, needle core biopsy, upper outer quadrant, 2:30 o'clock, 4cm from nipple - INVASIVE MAMMARY CARCINOMA WITH CALCIFICATIONS, SEE COMMENT.      06/28/2017 Receptors her2    Estrogen Receptor: 100%, POSITIVE, STRONG STAINING INTENSITY Progesterone Receptor: 20%, POSITIVE, STRONG STAINING INTENSITY Proliferation Marker Ki67: 25% HER2: Postive       06/30/2017 Initial Diagnosis    Malignant neoplasm of upper-outer quadrant of left breast in female, estrogen receptor positive ([place])       HORMONAL RISK FACTORS:  Menarche was at age 61. Ovaries intact: yes.  Hysterectomy:  no.  Menopausal status: postmenopausal. 51-57 HRT use: 0 years. Colonoscopy: yes; 5-10 years agoreportedly 'some polyps removed'. Mammogram within the last year: yes.  Past Medical History:  Diagnosis Date  . Cancer of left breast ([place])   . Constipation   . Family history of breast cancer   . Fibrocystic breast   . Hyperlipidemia   . Ocular migraine    "used to have them monthly for a period of time; seemed to stop; now have had a couple in the last week" (07/13/2017)    Past Surgical History:  Procedure Laterality Date  . BREAST BIOPSY Right ~ 2001   BREAST EXCISIONAL BIOPSY  . BREAST LUMPECTOMY Left 2001   chemo and radiation  . CESAREAN SECTION  X 1  . IR CATHETER TUBE CHANGE  07/20/2017  . MASTECTOMY COMPLETE / SIMPLE W/ SENTINEL NODE BIOPSY Left 07/13/2017    LEFT MASTECTOMY WITH SENTINEL NODE MAPPING ERAS PATHWAY [name] 07/13/2017  . MASTECTOMY W/ SENTINEL NODE BIOPSY Left 07/13/2017   Procedure: LEFT MASTECTOMY WITH SENTINEL NODE MAPPING ERAS PATHWAY;  Surgeon: [name], MD;  Location: [facility];  Service: General;  Laterality: Left;  . PORTACATH PLACEMENT Right 07/13/2017  . PORTACATH PLACEMENT Right 07/13/2017   Procedure: INSERTION PORT-A-CATH;  Surgeon: [name], MD;  Location: [facility];  Service: General;  Laterality: Right;  . TONSILLECTOMY      Social History   Social History  . Marital status: Married    Spouse name: N/A  . Number of children:  N/A  . Years of education: N/A   Social History Main Topics  . Smoking status: Former Smoker    Packs/day: 0.50    Years: 15.00    Types: Cigarettes    Quit date: 1976  . Smokeless tobacco: Never Used  . Alcohol use 6.0 oz/week    10 Glasses of wine per week  . Drug use: No  . Sexual activity: Not Currently   Other Topics Concern  . Not on file   Social History Narrative  . No narrative on file     FAMILY HISTORY:  We obtained a detailed, 4-generation family history.  Significant diagnoses are listed  below: Family History  Problem Relation Age of Onset  . Cancer Father        bladder cancer  . Breast cancer Sister 69   Kathleen Hanson has 1 adopted son and 1 biological daughter who is 51 with no history of cancer.  Kathleen Hanson has an 75 year-old sister with no history of cancer an no children, she still has her uterus and ovaries intact. She also has a 7 year-old sister who had breast cancer at age 46; she still has her uterus and ovaries intact.  This sister has 2 daughters.  Kathleen Hanson also has a brother who is 109 with no history of cancer.   Kathleen Hanson father was diagnosed with bladder cancer in his 52's/70's and died at 16.  He had a brother and a sister who both died in their 44's/80's.  No history of cancer in her paternal cousins.  Kathleen Hanson paternal grandfather died in his 79's from heart problems, and her paternal grandmother died at 2 with no history of cancer.   Kathleen Hanson mother died at 44 with no history of cancer.  She is unsure if her other ever had her uterus or ovaries removed.  Kathleen Hanson has a maternal aunt who is 62 with no history of cancer and a maternal uncle who died at 23 (no cancer).  Kathleen Hanson''s maternal grandfather died at 68 due to age related illness and her maternal grandmother died in her 36's due to a stroke.   Kathleen Hanson is unaware of previous family history of genetic testing for hereditary cancer risks. Patient's maternal ancestors are of [place] descent, and paternal ancestors are of [place] descent. There is no reported Ashkenazi Jewish ancestry. There is no known consanguinity.  GENETIC COUNSELING ASSESSMENT: Kathleen Hanson is a 81 y.o. female with a personal and family history which is somewhat suggestive of a Hereditary Cancer Predisposition Syndrome. We, therefore, discussed and recommended the following at today's visit.   DISCUSSION: We reviewed the characteristics, features and inheritance patterns of hereditary cancer  syndromes. We also discussed genetic testing, including the appropriate family members to test, the process of testing, insurance coverage and turn-around-time for results. We discussed the implications of a negative, positive and/or variant of uncertain significant result. We recommended Kathleen Hanson pursue genetic testing for the Multi-Cancer gene panel. The Multi-Cancer Panel offered by [facility] includes sequencing and/or deletion duplication testing of the following 83 genes: ALK, APC, ATM, AXIN2,BAP1,  BARD1, BLM, BMPR1A, BRCA1, BRCA2, BRIP1, CASR, CDC73, CDH1, CDK4, CDKN1B, CDKN1C, CDKN2A (p14ARF), CDKN2A (p16INK4a), CEBPA, CHEK2, CTNNA1, DICER1, DIS3L2, EGFR (c.2369C>T, p.Thr790Met variant only), EPCAM (Deletion/duplication testing only), FH, FLCN, GATA2, GPC3, GREM1 (Promoter region deletion/duplication testing only), HOXB13 (c.251G>A, p.Gly84Glu), HRAS, KIT, MAX, MEN1, MET, MITF (c.952G>A, p.Glu318Lys variant only), MLH1, MSH2, MSH3, MSH6, MUTYH, NBN, NF1, NF2, NTHL1, PALB2, PDGFRA,  PHOX2B, PMS2, POLD1, POLE, POT1, PRKAR1A, PTCH1, PTEN, RAD50, RAD51C, RAD51D, RB1, RECQL4, RET, RUNX1, SDHAF2, SDHA (sequence changes only), SDHB, SDHC, SDHD, SMAD4, SMARCA4, SMARCB1, SMARCE1, STK11, SUFU, TERC, TERT, TMEM127, TP53, TSC1, TSC2, VHL, WRN and WT1.   We discussed that only 5-10% of cancers are associated with a Hereditary cancer predisposition syndrome.  One of the most common hereditary cancer syndromes that increases breast cancer risk is called Hereditary Breast and Ovarian Cancer (HBOC) syndrome.  This syndrome is caused by mutations in the BRCA1 and BRCA2 genes.  This syndrome increases an individual's lifetime risk to develop breast, ovarian, pancreatic, and other types of cancer.  There are also many other cancer predisposition syndromes caused by mutations in several other genes.  We discussed that if she is found to have a mutation in one of these genes, it may impact future medical management  recommendations such as increased cancer screenings and consideration of risk reducing surgeries.  A positive result could also have implications for the patient's family members.  A Negative result would mean we were unable to identify a hereditary component to her cancer, but does not rule out the possibility of a hereditary basis for her cancer.  There could be mutations that are undetectable by current technology, or in genes not yet tested or identified to increase cancer risk.    We discussed the potential to find a Variant of Uncertain Significance or VUS.  These are variants that have not yet been identified as pathogenic or benign, and it is unknown if this variant is associated with increased cancer risk or if this is a normal finding.  Most VUS's are reclassified to benign or likely benign.   It should not be used to make medical management decisions. With time, we suspect the lab will determine the significance of any VUS's identified if any.   Based on Kathleen Hanson's personal and family history of cancer, she meets medical criteria for genetic testing. Despite that she meets criteria, she may still have an out of pocket cost. We discussed that if her out of pocket cost for testing is over $100, the laboratory will call and confirm whether she wants to proceed with testing.  If the out of pocket cost of testing is less than $100 she will be billed by the genetic testing laboratory.   PLAN: After considering the risks, benefits, and limitations, Kathleen Hanson  provided informed consent to pursue genetic testing and the blood sample was sent to [facility] for analysis of the Multi-Cancer Panel. Results should be available within approximately 2-3 weeks' time, at which point they will be disclosed by telephone to Kathleen Hanson, as will any additional recommendations warranted by these results. Kathleen Hanson. [place] will receive a summary of her genetic counseling visit and a copy of her results  once available. This information will also be available in Epic. We encouraged Kathleen Hanson. [place] to remain in contact with cancer genetics annually so that we can continuously update the family history and inform her of any changes in cancer genetics and testing that may be of benefit for her family. Kathleen Hanson. [place] questions were answered to her satisfaction today. Our contact information was provided should additional questions or concerns arise.  Lastly, we encouraged Kathleen Hanson. [place] to remain in contact with cancer genetics annually so that we can continuously update the family history and inform her of any changes in cancer genetics and testing that may be of benefit for this family.   Kathleen Hanson.  [place] questions  were answered to her satisfaction today. Our contact information was provided should additional questions or concerns arise. Thank you for the referral and allowing [place] to share in the care of your patient.   Tana Felts, Kathleen Hanson Genetic Counselor [email or web address] phone: [phone]  The patient was seen for a total of 40 minutes in face-to-face genetic counseling.

## 2017-08-08 ENCOUNTER — Ambulatory Visit: Payer: Medicare HMO | Admitting: Physical Therapy

## 2017-08-09 ENCOUNTER — Telehealth: Payer: Self-pay | Admitting: *Deleted

## 2017-08-09 NOTE — Telephone Encounter (Signed)
Received call from patient stating she would like to switch surgeons. She will look the [facility] website and decide.  Gave [facility] contact information and informed her she would need to call them to make the appointment.  Informed [facility] that patient would call when she has made a decision.

## 2017-08-12 ENCOUNTER — Telehealth: Payer: Self-pay | Admitting: Medical Oncology

## 2017-08-12 NOTE — Telephone Encounter (Signed)
Reviewed appts with pt and changed her chemo edu class.

## 2017-08-15 ENCOUNTER — Encounter: Payer: Self-pay | Admitting: *Deleted

## 2017-08-15 ENCOUNTER — Encounter (HOSPITAL_COMMUNITY)
Admission: RE | Admit: 2017-08-15 | Discharge: 2017-08-15 | Disposition: A | Discharge status: Home / Self Care | Payer: Medicare HMO | Source: Ambulatory Visit | Attending: Hematology | Admitting: Hematology

## 2017-08-15 ENCOUNTER — Ambulatory Visit (HOSPITAL_COMMUNITY)
Admission: RE | Admit: 2017-08-15 | Discharge: 2017-08-15 | Disposition: A | Discharge status: Home / Self Care | Payer: Medicare HMO | Source: Ambulatory Visit | Attending: Hematology | Admitting: Hematology

## 2017-08-15 ENCOUNTER — Telehealth: Payer: Self-pay | Admitting: *Deleted

## 2017-08-15 ENCOUNTER — Encounter (HOSPITAL_COMMUNITY): Payer: Self-pay

## 2017-08-15 ENCOUNTER — Other Ambulatory Visit: Payer: Self-pay

## 2017-08-15 DIAGNOSIS — J90 Pleural effusion, not elsewhere classified: Secondary | ICD-10-CM | POA: Diagnosis not present

## 2017-08-15 DIAGNOSIS — Z170 Estrogen receptor positive status [ER+]: Principal | ICD-10-CM

## 2017-08-15 DIAGNOSIS — C50412 Malignant neoplasm of upper-outer quadrant of left female breast: Secondary | ICD-10-CM | POA: Diagnosis not present

## 2017-08-15 DIAGNOSIS — J9811 Atelectasis: Secondary | ICD-10-CM | POA: Diagnosis not present

## 2017-08-15 DIAGNOSIS — K439 Ventral hernia without obstruction or gangrene: Secondary | ICD-10-CM | POA: Diagnosis not present

## 2017-08-15 DIAGNOSIS — K8020 Calculus of gallbladder without cholecystitis without obstruction: Secondary | ICD-10-CM | POA: Diagnosis not present

## 2017-08-15 MED ORDER — IOPAMIDOL (ISOVUE-300) INJECTION 61%
100.0000 mL | Freq: Once | INTRAVENOUS | Status: AC | PRN
  Administered 2017-08-15: 100 mL via INTRAVENOUS

## 2017-08-15 MED ORDER — IOPAMIDOL (ISOVUE-300) INJECTION 61%
INTRAVENOUS | Status: AC
  Administered 2017-08-15: 100 mL via INTRAVENOUS
  Filled 2017-08-15: qty 100

## 2017-08-15 MED ORDER — TECHNETIUM TC 99M MEDRONATE IV KIT
21.4000 | PACK | Freq: Once | INTRAVENOUS | Status: AC | PRN
  Administered 2017-08-15: 21.4 via INTRAVENOUS

## 2017-08-15 NOTE — Telephone Encounter (Signed)
Returned call after receipt of voicemail asking what lab work is needed for tomorrow.  No pending lab orders at this time.  "Please call me tomorrow before 10:00 am so I will know if I need lab work before I see Dr. Feng.  I had lab on July 06, 2017 when I met [facility].  I'd like to know results but do not have MyChart.  I called for assistance, no one called back yet.  The activation code expired October 7th."  This nurse obtained activation code: [email or web address] at this time.  Does not wish to wait to receive code tomorrow.  Activation allows results from that time forward.  Encouraged to ask for previous results of CBC-diff, CMET with tomorrow's visit.     

## 2017-08-16 ENCOUNTER — Other Ambulatory Visit: Payer: Medicare HMO

## 2017-08-16 ENCOUNTER — Ambulatory Visit (HOSPITAL_BASED_OUTPATIENT_CLINIC_OR_DEPARTMENT_OTHER): Payer: Medicare HMO | Admitting: Hematology

## 2017-08-16 ENCOUNTER — Telehealth: Payer: Self-pay

## 2017-08-16 ENCOUNTER — Other Ambulatory Visit (HOSPITAL_BASED_OUTPATIENT_CLINIC_OR_DEPARTMENT_OTHER): Payer: Medicare HMO

## 2017-08-16 ENCOUNTER — Telehealth: Payer: Self-pay | Admitting: Hematology

## 2017-08-16 VITALS — BP 157/62 | HR 69 | Temp 98.1°F | Resp 20 | Ht 64.0 in | Wt 134.4 lb

## 2017-08-16 DIAGNOSIS — Z170 Estrogen receptor positive status [ER+]: Secondary | ICD-10-CM

## 2017-08-16 DIAGNOSIS — Z803 Family history of malignant neoplasm of breast: Secondary | ICD-10-CM | POA: Diagnosis not present

## 2017-08-16 DIAGNOSIS — Z8052 Family history of malignant neoplasm of bladder: Secondary | ICD-10-CM

## 2017-08-16 DIAGNOSIS — C50412 Malignant neoplasm of upper-outer quadrant of left female breast: Secondary | ICD-10-CM

## 2017-08-16 DIAGNOSIS — Z87891 Personal history of nicotine dependence: Secondary | ICD-10-CM | POA: Diagnosis not present

## 2017-08-16 DIAGNOSIS — Z86000 Personal history of in-situ neoplasm of breast: Secondary | ICD-10-CM

## 2017-08-16 LAB — CBC WITH DIFFERENTIAL/PLATELET
BASO%: 0.4 % (ref 0.0–2.0)
Basophils Absolute: 0 10*3/uL (ref 0.0–0.1)
EOS%: 7 % (ref 0.0–7.0)
Eosinophils Absolute: 0.3 10*3/uL (ref 0.0–0.5)
HEMATOCRIT: 41.2 % (ref 34.8–46.6)
HEMOGLOBIN: 13.6 g/dL (ref 11.6–15.9)
LYMPH#: 0.6 10*3/uL — AB (ref 0.9–3.3)
LYMPH%: 11.7 % — ABNORMAL LOW (ref 14.0–49.7)
MCH: 31 pg (ref 25.1–34.0)
MCHC: 33 g/dL (ref 31.5–36.0)
MCV: 93.8 fL (ref 79.5–101.0)
MONO#: 0.3 10*3/uL (ref 0.1–0.9)
MONO%: 5.5 % (ref 0.0–14.0)
NEUT%: 75.4 % (ref 38.4–76.8)
NEUTROS ABS: 3.7 10*3/uL (ref 1.5–6.5)
Platelets: 218 10*3/uL (ref 145–400)
RBC: 4.39 10*6/uL (ref 3.70–5.45)
RDW: 13.1 % (ref 11.2–14.5)
WBC: 4.9 10*3/uL (ref 3.9–10.3)

## 2017-08-16 LAB — COMPREHENSIVE METABOLIC PANEL
ALT: 16 U/L (ref 0–55)
AST: 19 U/L (ref 5–34)
Albumin: 3.8 g/dL (ref 3.5–5.0)
Alkaline Phosphatase: 93 U/L (ref 40–150)
Anion Gap: 11 mEq/L (ref 3–11)
BUN: 12.1 mg/dL (ref 7.0–26.0)
CALCIUM: 9.1 mg/dL (ref 8.4–10.4)
CHLORIDE: 102 meq/L (ref 98–109)
CO2: 25 mEq/L (ref 22–29)
Creatinine: 0.6 mg/dL (ref 0.6–1.1)
GLUCOSE: 87 mg/dL (ref 70–140)
POTASSIUM: 3.7 meq/L (ref 3.5–5.1)
SODIUM: 138 meq/L (ref 136–145)
Total Bilirubin: 0.46 mg/dL (ref 0.20–1.20)
Total Protein: 7.1 g/dL (ref 6.4–8.3)

## 2017-08-16 NOTE — Progress Notes (Signed)
[facility]  [phone] [phone]  Clinic Follow Up Note   Patient Care Team: Beam, Russ Halo, PhD as PCP - General (Family Medicine) [name], MD as Consulting Physician (General Surgery) [name], MD as Consulting Physician (Hematology) [name], MD as Consulting Physician (Radiation Oncology) 08/16/2017  CHIEF COMPLAINTS/PURPOSE OF CONSULTATION:  Follow up left breast cancer, pT2N0M0, triple positive     Malignant neoplasm of upper-outer quadrant of left breast in female, estrogen receptor positive ([place])   06/23/2017 Mammogram    Diagnostic mammogram 06/23/17 IMPRESSION: Indeterminate mass with irregular margins at the 2:30 position 4 cm from nipple measuring 1.4 x 0.8 x 1.2 cm in the upper-outer left breast.      06/28/2017 Initial Biopsy    Diagnosis 06/28/17 Breast, left, needle core biopsy, upper outer quadrant, 2:30 o'clock, 4cm from nipple - INVASIVE MAMMARY CARCINOMA WITH CALCIFICATIONS, SEE COMMENT.      06/28/2017 Receptors her2    Estrogen Receptor: 100%, POSITIVE, STRONG STAINING INTENSITY Progesterone Receptor: 20%, POSITIVE, STRONG STAINING INTENSITY Proliferation Marker Ki67: 25% HER2: Postive       06/30/2017 Initial Diagnosis    Malignant neoplasm of upper-outer quadrant of left breast in female, estrogen receptor positive ([place])     07/13/2017 Surgery    Left mastectomy with SLN biopsy performed by Dr Marlou Starks.       07/18/2017 - 07/27/2017 Hospital Admission    Admit date: 07/18/17 Admission diagnosis: spontaneous PNX Additional comments: the patient was admitted with a pneumothorax several days after a port was placed although her post procedure cxr was normal. She had a chest tube placed but had a persistent air leak. The pleurivac was placed to 40 cm h2o suction for several days and the pneumothorax finally resolved.        HISTORY OF PRESENTING ILLNESS: 07/06/17 Kathleen Hanson 81 y.o. female is here  because of newly diagnosed Malignant neoplasm of upper-outer quadrant of left breast in female, estrogen receptor positive. She presents to Breast Clinic alone today.   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 Her sister had breast cancer when she was 99. Her father had bladder cancer due to smoking. She denies any other significant medical history. She smoked from [age] yo, less than a pack.   Today she reports to feeling her lump after her mammogram and she does not have any pain.  She denies change in weight, appetite or pain. She has been active with weight watchers and has purposefully lost weight.  She did not ask husband to attend but he would if she needed him to. She has two children, daughter and adopted son. She is retired, but previously a Pharmacist, hospital. 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 She wears hearing aids.    GYN HISTORY  Menarchal: 13-16 LMP: early 63s in 1992 Contraceptive: No HRT: No GP: G2P1A1, prior miscarriage, Has daughter and adopted son  CURRENT THERAPY: Pending adjuvant TCHP q3weeks for 6 cycles followed herceptin maintenance therapy and anti-estrogen therapy  INTERVAL HISTORY:  Kathleen Hanson presents today for scheduled follow up. The patient underwent total left mastectomy on 07/13/17. On 07/18/17 she underwent outpatient CT scan which demonstrated large right PNX with compression of the mediastinum concerning for developing tension. She subsequently arrived to the ED where she was admitted for further management. During her admission, a chest tube was placed. Initially, this  only showed minimally improvement, however, after being placed on pleurivac with water she her PNX completely resolved. She was discharged on 07/27/17. She reports that since her surgery and admission she has now switched surgeons to Dr Barry Dienes. She states that she is concerned with the hematoma overlaying  the left breast and how long it will last. She had this drained while in the hospital, but she reports that the area is still sore and still present. She notes that her admission was hard on her mentally and physically, but she has been recovering well overall.    MEDICAL HISTORY:  Past Medical History:  Diagnosis Date  . Cancer of left breast ([place])   . Constipation   . Family history of breast cancer   . Fibrocystic breast   . Hyperlipidemia   . Ocular migraine    "used to have them monthly for a period of time; seemed to stop; now have had a couple in the last week" (07/13/2017)    SURGICAL HISTORY: Past Surgical History:  Procedure Laterality Date  . BREAST BIOPSY Right ~ 2001   BREAST EXCISIONAL BIOPSY  . BREAST LUMPECTOMY Left 2001   chemo and radiation  . CESAREAN SECTION  X 1  . IR CATHETER TUBE CHANGE  07/20/2017  . MASTECTOMY COMPLETE / SIMPLE W/ SENTINEL NODE BIOPSY Left 07/13/2017    LEFT MASTECTOMY WITH SENTINEL NODE MAPPING ERAS PATHWAY [name] 07/13/2017  . MASTECTOMY W/ SENTINEL NODE BIOPSY Left 07/13/2017   Procedure: LEFT MASTECTOMY WITH SENTINEL NODE MAPPING ERAS PATHWAY;  Surgeon: [name], MD;  Location: [facility];  Service: General;  Laterality: Left;  . PORTACATH PLACEMENT Right 07/13/2017  . PORTACATH PLACEMENT Right 07/13/2017   Procedure: INSERTION PORT-A-CATH;  Surgeon: [name], MD;  Location: [facility];  Service: General;  Laterality: Right;  . TONSILLECTOMY      SOCIAL HISTORY: Social History   Social History  . Marital status: Married    Spouse name: N/A  . Number of children: N/A  . Years of education: N/A   Occupational History  . Not on file.   Social History Main Topics  . Smoking status: Former Smoker    Packs/day: 0.50    Years: 15.00    Types: Cigarettes    Quit date: 1976  . Smokeless tobacco: Never Used  . Alcohol use 6.0 oz/week    10 Glasses of wine per week  . Drug use: No  . Sexual activity: Not Currently   Other  Topics Concern  . Not on file   Social History Narrative  . No narrative on file    FAMILY HISTORY: Family History  Problem Relation Age of Onset  . Cancer Father        bladder cancer  . Breast cancer Sister 66    ALLERGIES:  has No Known Allergies.  MEDICATIONS:  Current Outpatient Prescriptions  Medication Sig Dispense Refill  . aspirin 81 MG chewable tablet Chew 81 mg by mouth daily.     . cholecalciferol (VITAMIN D) 1000 units tablet Take 1,000 Units by mouth daily.    Marland Kitchen HYDROcodone-acetaminophen (NORCO/VICODIN) 5-325 MG tablet Take 1-2 tablets by mouth every 4 (four) hours as needed for moderate pain. 15 tablet 0  . Multiple Vitamin (MULTIVITAMIN WITH MINERALS) TABS tablet Take 1 tablet by mouth daily.    . polyethylene glycol powder (GLYCOLAX/MIRALAX) powder MIX 17 GRAMS IN LIQUID AND DRINK DAILY IN THE EVENING    . pravastatin (PRAVACHOL) 40 MG tablet Take  40 mg by mouth daily.    . vitamin C (ASCORBIC ACID) 500 MG tablet Take 500 mg by mouth daily.     No current facility-administered medications for this visit.     REVIEW OF SYSTEMS:   Constitutional: Denies fevers, chills or abnormal night sweats Eyes: Denies blurriness of vision, double vision or watery eyes Ears, nose, mouth, throat, and face: Denies mucositis or sore throat (+) wears hearing aids Respiratory: Denies cough, dyspnea or wheezes Cardiovascular: Denies palpitation, chest discomfort or lower extremity swelling Gastrointestinal:  Denies nausea, heartburn or change in bowel habits Skin: Denies abnormal skin rashes Lymphatics: Denies new lymphadenopathy or easy bruising Neurological:Denies numbness, tingling or new weaknesses BREAST: (+) Palpable lump in left breast  Behavioral/Psych: Mood is stable, no new changes  All other systems were reviewed with the patient and are negative.  PHYSICAL EXAMINATION: ECOG PERFORMANCE STATUS: 0 - Asymptomatic  Vitals:   08/16/17 1143  BP: (!) 157/62  Pulse:  69  Resp: 20  Temp: 98.1 F (36.7 C)  SpO2: 99%   Filed Weights   08/16/17 1143  Weight: 134 lb 6.4 oz (61 kg)    GENERAL:alert, no distress and comfortable SKIN: skin color, texture, turgor are normal, no rashes or significant lesions EYES: normal, conjunctiva are pink and non-injected, sclera clear OROPHARYNX:no exudate, no erythema and lips, buccal mucosa, and tongue normal  NECK: supple, thyroid normal size, non-tender, without nodularity LYMPH:  no palpable lymphadenopathy in the cervical, axillary or inguinal LUNGS: clear to auscultation and percussion with normal breathing effort HEART: regular rate & rhythm and no murmurs and no lower extremity edema ABDOMEN:abdomen soft, non-tender and normal bowel sounds Musculoskeletal:no cyanosis of digits and no clubbing  PSYCH: alert & oriented x 3 with fluent speech NEURO: no focal motor/sensory deficits BREAST: (+) 2.5 x 2.5 cm lump in 2:30 position of left breast. no tenderness with extensive skin ecchymosis, No other palpable breast mass or adenopathy.    LABORATORY DATA:  I have reviewed the data as listed CBC Latest Ref Rng & Units 08/16/2017 07/18/2017 07/12/2017  WBC 3.9 - 10.3 10e3/uL 4.9 9.0 6.0  Hemoglobin 11.6 - 15.9 g/dL 13.6 13.8 13.7  Hematocrit 34.8 - 46.6 % 41.2 40.5 41.1  Platelets 145 - 400 10e3/uL 218 210 220    CMP Latest Ref Rng & Units 07/18/2017 07/12/2017 07/06/2017  Glucose 65 - 99 mg/dL 96 98 89  BUN 6 - 20 mg/dL 12 14 10.5  Creatinine 0.44 - 1.00 mg/dL 0.41(L) 0.56 0.6  Sodium 135 - 145 mmol/L 136 135 137  Potassium 3.5 - 5.1 mmol/L 3.8 4.0 4.3  Chloride 101 - 111 mmol/L 102 103 -  CO2 22 - 32 mmol/L '23 24 26  ' Calcium 8.9 - 10.3 mg/dL 8.7(L) 8.9 9.4  Total Protein 6.4 - 8.3 g/dL - - 7.1  Total Bilirubin 0.20 - 1.20 mg/dL - - 0.52  Alkaline Phos 40 - 150 U/L - - 88  AST 5 - 34 U/L - - 18  ALT 0 - 55 U/L - - 16   PATHOLOGY  Diagnosis 07/13/17 1. Breast, simple mastectomy, Left - INVASIVE AND IN  SITU DUCTAL CARCINOMA, 4 CM, MSBR GRADE 3. - MARGINS NOT INVOLVED. - SEE ONCOLOGY TABLE. 2. Lymph node, sentinel, biopsy, Left #1 - ONE BENIGN LYMPH NODE (0/1). 3. Lymph node, biopsy, Left #2 - ONE BENIGN LYMPH NODE (0/1). Microscopic Comment 1. BREAST, INVASIVE TUMOR Procedure: Mastectomy and two sentinel lymph nodes Laterality: Left breast Tumor Size: 4.0  cm Histologic Type: Ductal Grade: 3 Tubular Differentiation: 3 Nuclear Pleomorphism: 2 Mitotic Count: 2 Ductal Carcinoma in Situ (DCIS): Present, intermediate grade Extent of Tumor: Skin: Free of tumor Nipple: Free of tumor Skeletal muscle: Free of tumor Margins: Free of tumor Invasive carcinoma, distance from closest margin: 2 cm from posterior margin DCIS, distance from closest margin: 2 cm from posterior margin Regional Lymph Nodes: Number of Lymph Nodes Examined: 2 Number of Sentinel Lymph Nodes Examined: 2 Lymph Nodes with Macrometastases: 0 Lymph Nodes with Micrometastases: 0 1 of 3 FINAL for Kathleen Hanson, Kathleen Hanson ([id number]) Microscopic Comment(continued) Lymph Nodes with Isolated Tumor Cells: 0 Breast Prognostic Profile: Case number [id number] Estrogen Receptor: 100%, positive, strong staining Progesterone Receptor: 20%, positive, strong staining Her2: Equivocal by FISH, ratio 1.49 and average copy number 4.2. Positive by immunohistochemistry (3+) Ki-67: 25% Best tumor block for sendout testing: 1C Pathologic Stage Classification (pTNM, AJCC 8th Edition): Primary Tumor (pT): pT2 Regional Lymph Nodes (pN): pN0 Distant Metastases (pM): pMX  Diagnosis 06/28/17 Breast, left, needle core biopsy, upper outer quadrant, 2:30 o'clock, 4cm from nipple - INVASIVE MAMMARY CARCINOMA WITH CALCIFICATIONS, SEE COMMENT. Microscopic Comment The carcinoma appears grade 3. E-cadherin will be ordered and reported in an addendum. Prognostic markers will be ordered. Dr. Melina Copa has reviewed the case. The case was called to [facility] on 06/29/2017. FLUORESCENCE IN-SITU HYBRIDIZATION Results: HER2 - *EQUIVOCAL* (Her2 by IHC ordered) RATIO OF HER2/CEP17 SIGNALS 1.49 AVERAGE HER2 COPY NUMBER PER CELL 4.20 Reference Range: NEGATIVE HER2/CEP17 Ratio <2.0 and average HER2 copy number <4.0 EQUIVOCAL HER2/CEP17 Ratio <2.0 and average HER2 copy number >=4.0 and <6.0 POSITIVE HER2/CEP17 Ratio >=2.0 or <2.0 and average HER2 copy number >=6.0 PROGNOSTIC INDICATORS Results: IMMUNOHISTOCHEMICAL AND MORPHOMETRIC ANALYSIS PERFORMED MANUALLY Estrogen Receptor: 100%, POSITIVE, STRONG STAINING INTENSITY Progesterone Receptor: 20%, POSITIVE, STRONG STAINING INTENSITY Proliferation Marker Ki67: 25% 1 of 3 FINAL for Kathleen Hanson, Kathleen Hanson [phone]) ADDITIONAL INFORMATION:(continued) REFERENCE RANGE ESTROGEN RECEPTOR NEGATIVE 0% POSITIVE =>1% REFERENCE RANGE PROGESTERONE RECEPTOR NEGATIVE 0% POSITIVE =>1% All controls stained appropriately    RADIOGRAPHIC STUDIES: I have personally reviewed the radiological images as listed and agreed with the findings in the report.  NM Whole Body Scan 08/15/17 FINDINGS: Uptake at the LEFT lateral aspects of the cervical and lower thoracic spine, typically degenerative.  Uptake at the shoulders, elbows, wrists, hips, and knees, typically degenerative.  Asymmetric uptake at the sternoclavicular joints RIGHT greater than LEFT which may also be degenerative.  No definite foci of abnormal osseous tracer accumulation are identified which are suspicious for osseous metastases.  Biconvex thoracolumbar scoliosis.  Expected urinary tract and soft tissue distribution of tracer.  IMPRESSION: Scattered likely degenerative type uptake as above.  No definite scintigraphic evidence of osseous metastatic disease.  CT A/P10/22/18 IMPRESSION: 1. No CT findings to suggest metastatic disease involving the abdomen/pelvis. 2. Left chest wall fluid collection as  discussed above. 3. Small right pleural effusion with minimal overlying atelectasis. 4. Cholelithiasis. 5. Small periumbilical abdominal wall hernia containing fat.  Dg Chest 1 View  Result Date: 07/24/2017 CLINICAL DATA:  Follow-up right pneumothorax. EXAM: CHEST 1 VIEW COMPARISON:  07/23/2017 and prior studies FINDINGS: A small right apical pneumothorax is unchanged -5-10%. A right thoracostomy tube is again identified. Upper limits normal heart size and right Port-A-Cath are again identified. There is no evidence of airspace disease or pleural effusion. IMPRESSION: Unchanged appearance of the chest with small stable right apical pneumothorax and right thoracostomy tube. Electronically Signed   By: Dellis Filbert  Hu M.D.   On: 07/24/2017 11:45   Dg Chest 2 View  Result Date: 07/21/2017 CLINICAL DATA:  Right pneumothorax followup EXAM: CHEST  2 VIEW COMPARISON:  07/21/2017 FINDINGS: 30-40% right pneumothorax, enlarged from prior. A right pleural drainage pigtail catheter is in place. A left thoracic drain is also in place.  Left axillary clips. Power injectable right IJ Port-A-Cath tip:  SVC. Accounting for the degree of leftward rotation, which is moderate, there is no significant shift of cardiac or mediastinal structures to the left to suggest tension pneumothorax. Slight blunting of the left lateral costophrenic angle. IMPRESSION: 1. Increase in size of right pneumothorax, currently 30-40% of right hemithoracic volume. A right pleural drainage catheter remains in place. No tension component of the pneumothorax seen. 2. Left chest wall drain noted. 3. Thoracic spondylosis. 4. Slight blunting of the left lateral costophrenic angle, small pleural effusion not excluded. Electronically Signed   By: Van Clines M.D.   On: 07/21/2017 12:09   Dg Chest 2 View  Result Date: 07/20/2017 CLINICAL DATA:  Followup pneumothorax. EXAM: CHEST  2 VIEW COMPARISON:  07/19/2017 FINDINGS: There is a right chest wall  port a catheter with tip in the SVC. There is also a right-sided pigtail catheter within the left chest. This appears under advanced as a portion of the pigtail appears outside the chest cavity. There is a moderate right-sided pneumothorax which has increased from the previous exam measuring approximately 5.3 cm in thickness. Previously 1.5 cm. The heart size appears normal. No pleural effusions. Atelectasis noted within the lung bases. IMPRESSION: 1. Interval increase in volume of right-sided pneumothorax. The right chest tube appears to have been fact out and may need to be re-advanced. Electronically Signed   By: Kerby Moors M.D.   On: 07/20/2017 11:26   Ct Angio Chest Pe W Or Wo Contrast  Result Date: 07/18/2017 CLINICAL DATA:  Shortness of breath with chest pressure. Recent left mastectomy for breast cancer. Former smoker. EXAM: CT ANGIOGRAPHY CHEST WITH CONTRAST TECHNIQUE: Multidetector CT imaging of the chest was performed using the standard protocol during bolus administration of intravenous contrast. Multiplanar CT image reconstructions and MIPs were obtained to evaluate the vascular anatomy. CONTRAST:  100 cc Isovue-300 COMPARISON:  None. FINDINGS: Cardiovascular: There is no pulmonary embolism identified within the main, lobar or segmental pulmonary arteries bilaterally. Thoracic aorta is normal in caliber and configuration. No evidence of aortic dissection. Mediastinum/Nodes: Trace pericardial effusion versus, more likely, anterior pericardial wall thickening related to previous radiation therapy. No mass or enlarged lymph nodes within the mediastinum or perihilar regions. Esophagus appears normal. Lungs/Pleura: Large right-sided pneumothorax. At least mild shift of the mediastinal structures towards the left hemithorax. Associated airspace collapse of the right lung posteriorly. Left lung is clear. Upper Abdomen: No acute findings. Musculoskeletal: Mild degenerative spurring and ankylosis  throughout the slightly kyphotic and scoliotic thoracolumbar spine. No acute or suspicious osseous finding. Left chest wall drain status post mastectomy. Right IJ Port-A-Cath in place. Review of the MIP images confirms the above findings. IMPRESSION: 1. Very large RIGHT-SIDED pneumothorax. At least mild associated leftward shift of the mediastinal structures. Associated airspace collapse of the right lung posteriorly. 2. No pulmonary embolism. 3. Probable anterior pericardial wall thickening related to previous radiation therapy, less likely trace pericardial effusion. 4. No aortic aneurysm or aortic dissection. 5. Heart size is normal. Critical Value/emergent results were called by telephone at the time of interpretation on 07/18/2017 at 7:05 pm to Dr. Barry Dienes , who verbally  acknowledged these results. At Dr. Marlowe Aschoff request, patient was sent to the emergency room at [facility]. Electronically Signed   By: Franki Cabot M.D.   On: 07/18/2017 19:12   Nm Bone Scan Whole Body  Result Date: 08/15/2017 CLINICAL DATA:  LEFT breast cancer EXAM: NUCLEAR MEDICINE WHOLE BODY BONE SCAN TECHNIQUE: Whole body anterior and posterior images were obtained approximately 3 hours after intravenous injection of radiopharmaceutical. RADIOPHARMACEUTICALS:  21.4 mCi Technetium-66mMDP IV COMPARISON:  None Radiographic correlation: CT abdomen and pelvis 08/15/2017, chest radiograph 07/27/2017 FINDINGS: Uptake at the LEFT lateral aspects of the cervical and lower thoracic spine, typically degenerative. Uptake at the shoulders, elbows, wrists, hips, and knees, typically degenerative. Asymmetric uptake at the sternoclavicular joints RIGHT greater than LEFT which may also be degenerative. No definite foci of abnormal osseous tracer accumulation are identified which are suspicious for osseous metastases. Biconvex thoracolumbar scoliosis. Expected urinary tract and soft tissue distribution of tracer. IMPRESSION: Scattered likely  degenerative type uptake as above. No definite scintigraphic evidence of osseous metastatic disease. Electronically Signed   By: MLavonia DanaM.D.   On: 08/15/2017 15:12   Ct Abdomen Pelvis W Contrast  Result Date: 08/15/2017 CLINICAL DATA:  Left breast cancer with recent mastectomy (September 2018). Chemotherapy and radiation therapy complete. EXAM: CT ABDOMEN AND PELVIS WITH CONTRAST TECHNIQUE: Multidetector CT imaging of the abdomen and pelvis was performed using the standard protocol following bolus administration of intravenous contrast. CONTRAST:  100 cc Isovue-300 COMPARISON:  Chest CT 07/18/2017 FINDINGS: Lower chest: Small right pleural effusion with minimal overlying atelectasis. The heart is normal in size. No pericardial effusion. There is a large fluid collection in the left lower chest wall. This could be the bottom aspect of the breast prosthesis or a postoperative fluid collection. Recommend clinical correlation. Not present on prior chest CT from September 2018. No worrisome pulmonary lesions. Two sub 4 mm nodules noted just above the right hemidiaphragm. These are stable. Small hiatal hernia. Hepatobiliary: No focal hepatic lesions or intrahepatic biliary dilatation. Gallbladder demonstrates numerous layering gallstones. No findings for acute cholecystitis. No common bile duct dilatation. Pancreas: No mass, inflammation or ductal dilatation. Spleen: Normal size.  No focal lesions. Adrenals/Urinary Tract: The adrenal glands and kidneys are unremarkable and stable. Stomach/Bowel: The stomach, duodenum, small bowel and colon are unremarkable. No acute inflammatory process, mass lesions or obstructive findings. Small hiatal hernia is noted. The terminal ileum is normal. Moderate sigmoid diverticulosis. Vascular/Lymphatic: Scattered aortic calcifications but no aneurysm or dissection. Dense renal artery ostial calcifications are noted bilaterally. No mesenteric or retroperitoneal mass or adenopathy.  Reproductive: The uterus and ovaries are unremarkable. Other: No pelvic mass or adenopathy. No free pelvic fluid collections. No inguinal mass or adenopathy. Small periumbilical abdominal wall hernia containing fat. Musculoskeletal: No significant bony findings. Advanced lower lumbar facet disease and moderate degenerate disc disease at L5-S1. IMPRESSION: 1. No CT findings to suggest metastatic disease involving the abdomen/pelvis. 2. Left chest wall fluid collection as discussed above. 3. Small right pleural effusion with minimal overlying atelectasis. 4. Cholelithiasis. 5. Small periumbilical abdominal wall hernia containing fat. Electronically Signed   By: PMarijo SanesM.D.   On: 08/15/2017 11:14   Ir Catheter Tube Change  Result Date: 07/20/2017 INDICATION: [age]year old female with pneumothorax following port catheter placement. She had a percutaneous chest tube placed recently but the chest tube is become partially displaced and she has a recurrent pneumothorax. She presents for chest tube repositioning and replacement. EXAM: IR CATHETER TUBE CHANGE  MEDICATIONS: None ANESTHESIA/SEDATION: Fentanyl 50 mcg IV; Versed 1 mg IV Moderate Sedation Time:  16 minutes The patient was continuously monitored during the procedure by the interventional radiology nurse under my direct supervision. COMPLICATIONS: None immediate. FLUOROSCOPY TIME:  1 minute 0 seconds 5 mGy PROCEDURE: Informed written consent was obtained from the patient after a thorough discussion of the procedural risks, benefits and alternatives. All questions were addressed. Maximal Sterile Barrier Technique was utilized including caps, mask, sterile gowns, sterile gloves, sterile drape, hand hygiene and skin antiseptic. A timeout was performed prior to the initiation of the procedure. The existing retention suture was cut and removed. The tube was transected. An Amplatz wire was carefully advanced through the tube and directed to the apex of the right  hemithorax. The existing 51 French tube was then removed over the wire. A new sterile 16 French tube was then advanced over the wire and formed in the apex of the right pleural space. The catheter was connected to wall suction and secured to the skin with 0 Prolene suture and an air tight bandage. Follow-up chest x-ray obtained on the fluoroscopy table demonstrates a well-positioned tube an no evidence residual pneumothorax. IMPRESSION: Successful repositioning an exchange for a new 16 French right-sided chest tube. The chest tube is now positioned at the right lung apex with successful re-expansion of the lung. Electronically Signed   By: Jacqulynn Cadet M.D.   On: 07/20/2017 17:13   Dg Chest Port 1 View  Result Date: 07/27/2017 CLINICAL DATA:  Chest tube removal. EXAM: PORTABLE CHEST 1 VIEW COMPARISON:  07/27/2017. FINDINGS: Pigtail catheter has been removed. Tiny RIGHT - apical pneumothorax is redemonstrated, not significantly increased from priors. Trace RIGHT effusion is stable. Port-A-Cath good position. Unchanged cardiomediastinal silhouette and lung fields. IMPRESSION: Pigtail catheter removal with tiny RIGHT apical pneumothorax, not increased from priors. Electronically Signed   By: Staci Righter M.D.   On: 07/27/2017 15:10   Dg Chest Port 1 View  Result Date: 07/27/2017 CLINICAL DATA:  Pneumothorax. EXAM: PORTABLE CHEST 1 VIEW COMPARISON:  07/26/2017 FINDINGS: Right jugular Port-A-Cath terminates over the mid SVC. Right-sided chest tube is unchanged. The cardiac silhouette is upper limits of normal in size. Surgical clips are noted in the left axilla and along the left lateral chest wall. A tiny right apical pneumothorax is stable to slightly smaller than on the prior study. The lungs are well inflated. No airspace consolidation or edema is seen. A trace right pleural effusion is again noted. IMPRESSION: Persistent tiny right apical pneumothorax and trace right pleural effusion. Electronically  Signed   By: Logan Bores M.D.   On: 07/27/2017 07:07   Dg Chest Port 1 View  Result Date: 07/26/2017 CLINICAL DATA:  Recent pneumothorax. EXAM: PORTABLE CHEST 1 VIEW COMPARISON:  July 25, 2017 FINDINGS: Port-A-Cath tip is in the superior vena cava. Chest tube on the right is unchanged in position. There is a rather minimal apical pneumothorax, marginally smaller than 1 day prior. Chest tube is been removed from the left side without apparent pneumothorax. There is a small right pleural effusion. There is no edema or consolidation. Heart is upper normal in size with pulmonary vascularity within normal limits. No adenopathy. There are surgical clips in the left axillary region along the left lateral hemithorax. There is degenerative change in the thoracic spine. IMPRESSION: Tube and catheter positions as described. Minimal pneumothorax on the right without tension component. Pneumothorax the right appears slightly smaller compared to 1 day prior. Small right  pleural effusion evident. No edema or consolidation. Stable cardiac silhouette. Electronically Signed   By: Lowella Grip III M.D.   On: 07/26/2017 07:08   Dg Chest Port 1 View  Result Date: 07/25/2017 CLINICAL DATA:  81 year old female with breast cancer post nephrectomy. Former smoker. Pneumothorax. Subsequent encounter. EXAM: PORTABLE CHEST 1 VIEW COMPARISON:  07/24/2017 FINDINGS: Right apical 5-10% pneumothorax without change. Right pigtail catheter chest tube unchanged in position. Right central line tip mid distal superior vena cava level. Post left mastectomy and axillary lymph node dissection. Heart size top-normal. IMPRESSION: Similar appearance of right apical pneumothorax (approximately 5-10%) with right-sided chest tube unchanged in position. Electronically Signed   By: Genia Del M.D.   On: 07/25/2017 07:11   Dg Chest Port 1 View  Result Date: 07/23/2017 CLINICAL DATA:  Right pneumothorax.  History of breast cancer. EXAM:  PORTABLE CHEST 1 VIEW COMPARISON:  Yesterday FINDINGS: Right-sided chest tube in stable position. Small right apical pneumothorax, stable from before and measuring 2 interspaces. Right IJ central line with tip at the SVC. Postoperative left chest wall with drain. The lungs are clear. No effusion or edema. Normal heart size. IMPRESSION: Stable chest tube positioning and small right apical pneumothorax. Electronically Signed   By: Monte Fantasia M.D.   On: 07/23/2017 07:44   Dg Chest Port 1 View  Result Date: 07/22/2017 CLINICAL DATA:  Pneumothorax. EXAM: PORTABLE CHEST 1 VIEW COMPARISON:  06/20/2017 . FINDINGS: Right chest tube in stable position. Interval improvement of right pneumothorax with mild right apical residual. Left chest tube in stable position. PowerPort catheter stable position Heart size is stable cardiomegaly. No focal infiltrate. Tiny left pleural effusion cannot be excluded. Surgical clips left chest. IMPRESSION: 1. Right chest tube in stable position. Interim improvement of right pneumothorax with mild right apical residual. 2. Left chest tube in stable position. Power port catheter stable position . 3. Tiny left pleural effusion again cannot be excluded . No change from prior exam. Electronically Signed   By: Marcello Moores  Register   On: 07/22/2017 06:14   Dg Chest Port 1 View  Result Date: 07/21/2017 CLINICAL DATA:  Persistent pneumothorax. Chest tube replacement on July 20, 2017 EXAM: PORTABLE CHEST 1 VIEW COMPARISON:  New chest x-ray of July 20, 2017 FINDINGS: The lungs are well-expanded. The pneumothorax on the right has decreased in volume. The pleural line is visible overlying the posterior aspect of the right fourth rib. The pigtail of the small caliber chest tube projects over the lateral aspect of the hemithorax. There is no mediastinal shift. The left lung is clear. The cardiac silhouette is top-normal in size. The pulmonary vascularity is normal. Minimal right basilar  atelectasis persists. A tubular project structure projects over the lower left lung and left upper abdomen which may reflect a drainage catheter. IMPRESSION: Decreased volume of the right pleural effusion. The right chest tube is in appropriate position. Electronically Signed   By: David  Martinique M.D.   On: 07/21/2017 07:45   Dg Chest Port 1 View  Result Date: 07/19/2017 CLINICAL DATA:  Right-sided pneumothorax. EXAM: PORTABLE CHEST 1 VIEW COMPARISON:  Radiograph of July 18, 2017. FINDINGS: Stable cardiomediastinal silhouette. Stable position of right-sided chest tube. Mild right apical pneumothorax is noted and unchanged. Stable position of right internal jugular catheter. Stable mild right basilar subsegmental atelectasis is noted. Left lung is clear. Mild right upper lobe opacity is noted concerning for possible early infiltrate. Bony thorax is unremarkable. IMPRESSION: Stable mild right apical pneumothorax. Stable position  of chest tube. Stable mild right basilar subsegmental atelectasis. Mild right upper lobe opacity is noted concerning for possible early infiltrate. Electronically Signed   By: Marijo Conception, M.D.   On: 07/19/2017 07:25   Dg Chest Portable 1 View  Result Date: 07/18/2017 CLINICAL DATA:  Shortness of breath EXAM: PORTABLE CHEST 1 VIEW COMPARISON:  CT 07/18/2017, radiograph 07/13/2017 FINDINGS: Interim placement of a right upper lateral chest tube. Small residual right apical pneumothorax, demonstrating about 12 mm of pleural-parenchymal separation. Right-sided central venous port tip overlies the distal SVC. Probable tiny right effusion and minimal atelectasis at the right base. Stable cardiomediastinal silhouette. Postsurgical changes of the left axilla and breast. IMPRESSION: 1. Placement of a right-sided chest tube with the pigtail visualized over the upper lateral aspect of the right thorax. Small residual right apical pneumothorax. 2. Tiny right effusion and minimal bibasilar  atelectasis Electronically Signed   By: Donavan Foil M.D.   On: 07/18/2017 21:19    ASSESSMENT & PLAN:  DEBBIE YEARICK is a 81 y.o. caucasian female with a history of breast cancer, HLD, presented with screening discovered left breast cancer.  1. Malignant neoplasm of upper-outer quadrant of left breast in female, invasive ductal carcinoma, pT2N0M0, Stage: 1A, triple positive, Grade 3 -She underwent mastectomy on 07/13/17, she developed spontaneous PNX and on 07/18/17 she was admitted. She had a chest tube placed with pleuivac was placed to 40cm H2O suction for several days and her PNX resolved now -I reviewed her surgical pathology results with patient in details. -Her primary tumor was 4 cm, grade 3, triple positive, surgical margins were negative, 2 sentinel lymph nodes were negative. -We discussed her invasive ductal tumor is early stage, but the Biology is more aggressive, due to HER2 positive, grade 3. She has moderate to high risk of recurrence after surgical resection. -I recommend adjuvant chemotherapy along with anti-HER-2 therapy. Due to the T2 tumor, I recommend docetaxel, carboplatin, and Herceptin every 3 weeks for 6 cycles, followed by maintenance Herceptin for a total of 6 months treatment. Due to her advanced age, I Will slightly decrease her carboplatin dose to AUC 5. I think the benefit of the data is small in her early stage disease, due to the potential side effects and her advanced age, I will not add Perjeta.  --Chemotherapy consent: Side effects including but does not not limited to, fatigue, nausea, vomiting, diarrhea, hair loss, neuropathy, fluid retention, renal and kidney dysfunction, neutropenic fever, needed for blood transfusion, bleeding, were discussed with patient in great detail. She agrees to proceed.  -the goal of therapy is curative  -Since she is ER/PR positive she will do Anti-estrogen therapy, letrozole for 5-7 years after chemo.  -Chemo class  tomorrow -She has recovered well from surgery, I plan to start her on chemotherapy within a week   2. 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 Bone Health -Osteopenia 03/27/2014, DEXA 2012 T=-1.9, 2015 T = -1.4;  -I encouraged her to take calcium and vitamin D supplement  PLAN: -chemo class tomorrow -First chemo TCH within one week, hopefully this week  -Lab, flush and f/u one week after chemo   No orders of the defined types were placed in this encounter.   All questions were answered. The patient knows to call the clinic with any problems, questions or concerns. I  spent 25 minutes counseling the patient face to face. The total time spent in the appointment was 30 minutes and more than 50% was on counseling.  This document serves as a record of services personally performed by [name], MD. It was created on her behalf by Reola Mosher, a trained medical scribe. The creation of this record is based on the scribe's personal observations and the provider's statements to them. This document has been checked and approved by the attending provider.     [name], MD 08/16/2017

## 2017-08-16 NOTE — Telephone Encounter (Signed)
Message left on voicemail with return call.  Yes she will have lab work before today's F/U visit.  Changes may have occurred since July 06, 2017 in addition to procedures noted in surgical history today by this nurse.

## 2017-08-16 NOTE — Telephone Encounter (Signed)
Pt called again asking if she needs labs today. Called back and said yes she does need labs. She has been in hospital since and numbers may have changed. Informed her message had been left on her answering machine yesterday.

## 2017-08-16 NOTE — Telephone Encounter (Signed)
Unable to schedule 10/23 los. due to capped day - messaged Cindy and cc Dr. Burr Medico - gave patient AVS and patient is aware that they will be called.

## 2017-08-17 ENCOUNTER — Encounter: Payer: Self-pay | Admitting: Hematology

## 2017-08-17 ENCOUNTER — Telehealth: Payer: Self-pay

## 2017-08-17 ENCOUNTER — Encounter: Payer: Self-pay | Admitting: *Deleted

## 2017-08-17 ENCOUNTER — Other Ambulatory Visit: Payer: Medicare HMO

## 2017-08-17 MED ORDER — ONDANSETRON HCL 8 MG PO TABS: 8.0000 mg | ORAL_TABLET | Freq: Two times a day (BID) | ORAL | 1 refills | Status: DC | PRN

## 2017-08-17 MED ORDER — LIDOCAINE-PRILOCAINE 2.5-2.5 % EX CREA: TOPICAL_CREAM | CUTANEOUS | 3 refills | Status: DC

## 2017-08-17 MED ORDER — PROCHLORPERAZINE MALEATE 10 MG PO TABS: 10.0000 mg | ORAL_TABLET | Freq: Four times a day (QID) | ORAL | 1 refills | Status: DC | PRN

## 2017-08-17 MED ORDER — DEXAMETHASONE 4 MG PO TABS: 8.0000 mg | ORAL_TABLET | Freq: Two times a day (BID) | ORAL | 1 refills | Status: DC

## 2017-08-17 NOTE — Progress Notes (Signed)
START ON PATHWAY REGIMEN - Breast     A cycle is every 21 days:     Docetaxel      Carboplatin      Trastuzumab      Trastuzumab      Pertuzumab      Pertuzumab   **Always confirm dose/schedule in your pharmacy ordering system**    Patient Characteristics: Postoperative without Neoadjuvant Therapy (Pathologic Staging), Invasive Disease, Adjuvant Therapy, HER2 Positive, ER Positive, Node Negative, pT2 or Higher Therapeutic Status: Postoperative without Neoadjuvant Therapy (Pathologic Staging) AJCC Grade: G3 AJCC N Category: pN0 AJCC M Category: cM0 ER Status: Positive (+) AJCC 8 Stage Grouping: IA HER2 Status: Positive (+) Oncotype Dx Recurrence Score: Not Appropriate AJCC T Category: pT2 PR Status: Positive (+) Intent of Therapy: Curative Intent, Discussed with Patient 

## 2017-08-17 NOTE — Telephone Encounter (Signed)
Patient prefer to come on Fridays. Printed avs and calender for upcoming appointment. Spoke with Lacie concerning the scheduled dates. Per 10/24 los

## 2017-08-19 ENCOUNTER — Telehealth: Payer: Self-pay | Admitting: Genetics

## 2017-08-22 ENCOUNTER — Ambulatory Visit: Payer: Self-pay | Admitting: Genetics

## 2017-08-22 ENCOUNTER — Encounter: Payer: Self-pay | Admitting: Genetics

## 2017-08-22 DIAGNOSIS — C50412 Malignant neoplasm of upper-outer quadrant of left female breast: Secondary | ICD-10-CM

## 2017-08-22 DIAGNOSIS — Z1379 Encounter for other screening for genetic and chromosomal anomalies: Secondary | ICD-10-CM

## 2017-08-22 DIAGNOSIS — Z170 Estrogen receptor positive status [ER+]: Principal | ICD-10-CM

## 2017-08-22 DIAGNOSIS — Z803 Family history of malignant neoplasm of breast: Secondary | ICD-10-CM

## 2017-08-22 NOTE — Progress Notes (Signed)
HPI: Ms. Desantiago was previously seen in the [place] clinic on 08/01/2018 due to a personal and family history of breast cancer and concerns regarding a hereditary predisposition to cancer. Please refer to our prior cancer genetics clinic note for more information regarding Ms. Pownall's medical, social and family histories, and our assessment and recommendations, at the time. Ms. Causey recent genetic test results were disclosed to her, as well as recommendations warranted by these results. These results and recommendations are discussed in more detail below.  CANCER HISTORY:    Malignant neoplasm of upper-outer quadrant of left breast in female, estrogen receptor positive ([place])   06/23/2017 Mammogram    Diagnostic mammogram 06/23/17 IMPRESSION: Indeterminate mass with irregular margins at the 2:30 position 4 cm from nipple measuring 1.4 x 0.8 x 1.2 cm in the upper-outer left breast.      06/28/2017 Initial Biopsy    Diagnosis 06/28/17 Breast, left, needle core biopsy, upper outer quadrant, 2:30 o'clock, 4cm from nipple - INVASIVE MAMMARY CARCINOMA WITH CALCIFICATIONS, SEE COMMENT.      06/28/2017 Receptors her2    Estrogen Receptor: 100%, POSITIVE, STRONG STAINING INTENSITY Progesterone Receptor: 20%, POSITIVE, STRONG STAINING INTENSITY Proliferation Marker Ki67: 25% HER2: Postive       06/30/2017 Initial Diagnosis    Malignant neoplasm of upper-outer quadrant of left breast in female, estrogen receptor positive ([place])     07/13/2017 Surgery    Left mastectomy with SLN biopsy performed by Dr Marlou Starks.       07/13/2017 Pathology Results    Diagnosis  1. Breast, simple mastectomy, Left - INVASIVE AND IN SITU DUCTAL CARCINOMA, 4 CM, MSBR GRADE 3. - MARGINS NOT INVOLVED. - SEE ONCOLOGY TABLE. 2. Lymph node, sentinel, biopsy, Left #1 - ONE BENIGN LYMPH NODE (0/1). 3. Lymph node, biopsy, Left #2 - ONE BENIGN LYMPH NODE (0/1).      07/18/2017 - 07/27/2017 Hospital  Admission    Admit date: 07/18/17 Admission diagnosis: spontaneous PNX Additional comments: the patient was admitted with a pneumothorax several days after a port was placed although her post procedure cxr was normal. She had a chest tube placed but had a persistent air leak. The pleurivac was placed to 40 cm h2o suction for several days and the pneumothorax finally resolved.       08/15/2017 Imaging    CT A/P IMPRESSION: 1. No CT findings to suggest metastatic disease involving the abdomen/pelvis. 2. Left chest wall fluid collection as discussed above. 3. Small right pleural effusion with minimal overlying atelectasis. 4. Cholelithiasis. 5. Small periumbilical abdominal wall hernia containing fat.       08/15/2017 Imaging    NM Whole Body Bone Scan FINDINGS: Uptake at the LEFT lateral aspects of the cervical and lower thoracic spine, typically degenerative.  Uptake at the shoulders, elbows, wrists, hips, and knees, typically degenerative.  Asymmetric uptake at the sternoclavicular joints RIGHT greater than LEFT which may also be degenerative.  No definite foci of abnormal osseous tracer accumulation are identified which are suspicious for osseous metastases.  Biconvex thoracolumbar scoliosis.  Expected urinary tract and soft tissue distribution of tracer.  IMPRESSION: Scattered likely degenerative type uptake as above.  No definite scintigraphic evidence of osseous metastatic disease.      08/16/2017 Genetic Testing    The patient had genetic testing due to a personal and family history of breast cancer.  The [facility] Multi-Cancer Panel was ordered. The Multi-Cancer Panel offered by [facility] includes sequencing and/or deletion duplication testing of the  following 83 genes: ALK, APC, ATM, AXIN2,BAP1,  BARD1, BLM, BMPR1A, BRCA1, BRCA2, BRIP1, CASR, CDC73, CDH1, CDK4, CDKN1B, CDKN1C, CDKN2A (p14ARF), CDKN2A (p16INK4a), CEBPA, CHEK2, CTNNA1, DICER1, DIS3L2, EGFR  (c.2369C>T, p.Thr790Met variant only), EPCAM (Deletion/duplication testing only), FH, FLCN, GATA2, GPC3, GREM1 (Promoter region deletion/duplication testing only), HOXB13 (c.251G>A, p.Gly84Glu), HRAS, KIT, MAX, MEN1, MET, MITF (c.952G>A, p.Glu318Lys variant only), MLH1, MSH2, MSH3, MSH6, MUTYH, NBN, NF1, NF2, NTHL1, PALB2, PDGFRA, PHOX2B, PMS2, POLD1, POLE, POT1, PRKAR1A, PTCH1, PTEN, RAD50, RAD51C, RAD51D, RB1, RECQL4, RET, RUNX1, SDHAF2, SDHA (sequence changes only), SDHB, SDHC, SDHD, SMAD4, SMARCA4, SMARCB1, SMARCE1, STK11, SUFU, TERC, TERT, TMEM127, TP53, TSC1, TSC2, VHL, WRN and WT1.   Results: Negative, no pathogenic variants identified in the genes analyzed.  The date of this test report is 08/16/2017.           FAMILY HISTORY:  We obtained a detailed, 4-generation family history.  Significant diagnoses are listed below: Family History  Problem Relation Age of Onset  . Cancer Father        bladder cancer  . Breast cancer Sister 62   Ms. Brickey has 1 adopted son and 1 biological daughter who is 15 with no history of cancer.  Ms. Marschner has an 3 year-old sister with no history of cancer an no children, she still has her uterus and ovaries intact. She also has a 63 year-old sister who had breast cancer at age 48; she still has her uterus and ovaries intact.  This sister has 2 daughters.  Ms. Goeser also has a brother who is 87 with no history of cancer.   Ms. Giammona father was diagnosed with bladder cancer in his 45's/70's and died at 41.  He had a brother and a sister who both died in their 92's/80's.  No history of cancer in her paternal cousins.  Ms. Sayavong paternal grandfather died in his 71's from heart problems, and her paternal grandmother died at 49 with no history of cancer.   Ms. Friedli mother died at 29 with no history of cancer.  She is unsure if her other ever had her uterus or ovaries removed.  Ms. Auth has a maternal aunt who is 69 with no  history of cancer and a maternal uncle who died at 25 (no cancer).  Ms. Anker''s maternal grandfather died at 15 due to age related illness and her maternal grandmother died in her 43's due to a stroke.   Ms. Crouse is unaware of previous family history of genetic testing for hereditary cancer risks. Patient's maternal ancestors are of [place] descent, and paternal ancestors are of [place] descent. There is no reported Ashkenazi Jewish ancestry. There is no known consanguinity.  GENETIC TEST RESULTS: Genetic testing performed through [facility]'s Multi-Cancer Panel reported out on 08/16/2017 showed no pathogenic mutations. The Multi-Cancer Panel offered by [facility] includes sequencing and/or deletion duplication testing of the following 83 genes: ALK, APC, ATM, AXIN2,BAP1,  BARD1, BLM, BMPR1A, BRCA1, BRCA2, BRIP1, CASR, CDC73, CDH1, CDK4, CDKN1B, CDKN1C, CDKN2A (p14ARF), CDKN2A (p16INK4a), CEBPA, CHEK2, CTNNA1, DICER1, DIS3L2, EGFR (c.2369C>T, p.Thr790Met variant only), EPCAM (Deletion/duplication testing only), FH, FLCN, GATA2, GPC3, GREM1 (Promoter region deletion/duplication testing only), HOXB13 (c.251G>A, p.Gly84Glu), HRAS, KIT, MAX, MEN1, MET, MITF (c.952G>A, p.Glu318Lys variant only), MLH1, MSH2, MSH3, MSH6, MUTYH, NBN, NF1, NF2, NTHL1, PALB2, PDGFRA, PHOX2B, PMS2, POLD1, POLE, POT1, PRKAR1A, PTCH1, PTEN, RAD50, RAD51C, RAD51D, RB1, RECQL4, RET, RUNX1, SDHAF2, SDHA (sequence changes only), SDHB, SDHC, SDHD, SMAD4, SMARCA4, SMARCB1, SMARCE1, STK11, SUFU, TERC, TERT, TMEM127, TP53, TSC1, TSC2, VHL, WRN  and WT1.   The test report will be scanned into EPIC and will be located under the Molecular Pathology section of the Results Review tab.A portion of the result report is included below for reference.     We discussed with Ms. Patman that because current genetic testing is not perfect, it is possible there may be a gene mutation in one of these genes that current testing cannot detect, but that  chance is small. We also discussed, that there could be another gene that has not yet been discovered, or that we have not yet tested, that is responsible for the cancer diagnoses in the family. Therefore, it is important to remain in touch with cancer genetics in the future so that we can continue to offer Ms. Brow the most up to date genetic testing.   ADDITIONAL GENETIC TESTING: We discussed with Ms. Holts that her genetic testing was fairly extensive.  If there are are genes identified to increase cancer risk that can be analyzed in the future, we would be happy to discuss and coordinate this testing at that time.    CANCER SCREENING RECOMMENDATIONS: This result is reassuring and indicates that it is unlikely Ms. Demattia has an increased risk for a future cancer due to a mutation in one of these genes. This normal test also suggests that Ms. Dyar's cancer was most likely not due to an inherited predisposition associated with one of these genes.  Most cancers happen by chance and this negative test suggests that her cancer may fall into this category.  Therefore, it is recommended she continue to follow the cancer management and screening guidelines provided by her oncology and primary healthcare provider. Other factors such as her personal and family history may still affect her cancer risk.  RECOMMENDATIONS FOR FAMILY MEMBERS: Women in this family might be at some increased risk of developing cancer, over the general population risk, simply due to the family history of cancer. We recommended women in this family have a yearly mammogram beginning at age 39, or 62 years younger than the earliest onset of cancer, an annual clinical breast exam, and perform monthly breast self-exams. Her daughter and other women in the family should inform their doctors about the family history of cancer so they can assess their individual risk and make appropriate screening recommendations.  Women in this  family should also have a gynecological exam as recommended by their primary provider. All family members should have a colonoscopy by age 76.  FOLLOW-UP: Lastly, we discussed with Ms. Fana that cancer genetics is a rapidly advancing field and it is possible that new genetic tests will be appropriate for her and/or her family members in the future. We encouraged her to remain in contact with cancer genetics on an annual basis so we can update her personal and family histories and let her know of advances in cancer genetics that may benefit this family.   Our contact number was provided. Ms. Towell questions were answered to her satisfaction, and she knows she is welcome to call us at anytime with additional questions or concerns.   Ferol Luz, MS Genetic Counselor [email or web address]

## 2017-08-22 NOTE — Telephone Encounter (Signed)
Revealed negative genetic testing.  This normal result is reassuring and indicates that it is unlikely Kathleen Hanson's cancer is due to a hereditary cause.  It is unlikely that there is an increased risk of another cancer due to a mutation in one of these genes.  However, genetic testing is not perfect, and cannot definitively rule out a hereditary cause.  It will be important for her to keep in contact with genetics to learn if any additional testing may be needed in the future.   Her daughters and nieces should inform their doctors about the family history of cancer so they can assess their individual risk and make appropriate screening recommendations.

## 2017-08-25 ENCOUNTER — Other Ambulatory Visit: Payer: Self-pay | Admitting: General Surgery

## 2017-08-25 DIAGNOSIS — N63 Unspecified lump in unspecified breast: Secondary | ICD-10-CM

## 2017-08-26 ENCOUNTER — Encounter: Payer: Self-pay | Admitting: *Deleted

## 2017-08-26 ENCOUNTER — Ambulatory Visit (HOSPITAL_BASED_OUTPATIENT_CLINIC_OR_DEPARTMENT_OTHER): Payer: Medicare HMO

## 2017-08-26 ENCOUNTER — Encounter: Payer: Self-pay | Admitting: Pharmacist

## 2017-08-26 ENCOUNTER — Other Ambulatory Visit: Payer: Self-pay | Admitting: Medical Oncology

## 2017-08-26 VITALS — BP 142/59 | HR 69 | Temp 98.8°F | Resp 17

## 2017-08-26 DIAGNOSIS — Z5111 Encounter for antineoplastic chemotherapy: Secondary | ICD-10-CM | POA: Diagnosis not present

## 2017-08-26 DIAGNOSIS — C50412 Malignant neoplasm of upper-outer quadrant of left female breast: Secondary | ICD-10-CM

## 2017-08-26 DIAGNOSIS — Z170 Estrogen receptor positive status [ER+]: Principal | ICD-10-CM

## 2017-08-26 DIAGNOSIS — Z5112 Encounter for antineoplastic immunotherapy: Secondary | ICD-10-CM

## 2017-08-26 LAB — COMPREHENSIVE METABOLIC PANEL
ALK PHOS: 87 U/L (ref 40–150)
ALT: 16 U/L (ref 0–55)
AST: 16 U/L (ref 5–34)
Albumin: 3.6 g/dL (ref 3.5–5.0)
Anion Gap: 8 mEq/L (ref 3–11)
BUN: 10.4 mg/dL (ref 7.0–26.0)
CHLORIDE: 105 meq/L (ref 98–109)
CO2: 26 mEq/L (ref 22–29)
CREATININE: 0.6 mg/dL (ref 0.6–1.1)
Calcium: 8.9 mg/dL (ref 8.4–10.4)
Glucose: 112 mg/dl (ref 70–140)
POTASSIUM: 3.7 meq/L (ref 3.5–5.1)
Sodium: 139 mEq/L (ref 136–145)
TOTAL PROTEIN: 6.8 g/dL (ref 6.4–8.3)
Total Bilirubin: 0.41 mg/dL (ref 0.20–1.20)

## 2017-08-26 LAB — CBC WITH DIFFERENTIAL/PLATELET
BASO%: 0.6 % (ref 0.0–2.0)
Basophils Absolute: 0 10*3/uL (ref 0.0–0.1)
EOS ABS: 0.2 10*3/uL (ref 0.0–0.5)
EOS%: 4.8 % (ref 0.0–7.0)
HEMATOCRIT: 39.1 % (ref 34.8–46.6)
HGB: 13.1 g/dL (ref 11.6–15.9)
LYMPH#: 0.7 10*3/uL — AB (ref 0.9–3.3)
LYMPH%: 17.9 % (ref 14.0–49.7)
MCH: 30.6 pg (ref 25.1–34.0)
MCHC: 33.4 g/dL (ref 31.5–36.0)
MCV: 91.5 fL (ref 79.5–101.0)
MONO#: 0.3 10*3/uL (ref 0.1–0.9)
MONO%: 7.3 % (ref 0.0–14.0)
NEUT%: 69.4 % (ref 38.4–76.8)
NEUTROS ABS: 2.7 10*3/uL (ref 1.5–6.5)
PLATELETS: 245 10*3/uL (ref 145–400)
RBC: 4.27 10*6/uL (ref 3.70–5.45)
RDW: 13.3 % (ref 11.2–14.5)
WBC: 3.9 10*3/uL (ref 3.9–10.3)

## 2017-08-26 MED ORDER — HEPARIN SOD (PORK) LOCK FLUSH 100 UNIT/ML IV SOLN
500.0000 [IU] | Freq: Once | INTRAVENOUS | Status: AC | PRN
  Administered 2017-08-26: 500 [IU]
  Filled 2017-08-26: qty 5

## 2017-08-26 MED ORDER — TRASTUZUMAB CHEMO 150 MG IV SOLR
8.0000 mg/kg | Freq: Once | INTRAVENOUS | Status: AC
  Administered 2017-08-26: 483 mg via INTRAVENOUS
  Filled 2017-08-26: qty 23

## 2017-08-26 MED ORDER — PALONOSETRON HCL INJECTION 0.25 MG/5ML
0.2500 mg | Freq: Once | INTRAVENOUS | Status: AC
  Administered 2017-08-26: 0.25 mg via INTRAVENOUS

## 2017-08-26 MED ORDER — DOCETAXEL CHEMO INJECTION 160 MG/16ML
75.0000 mg/m2 | Freq: Once | INTRAVENOUS | Status: AC
  Administered 2017-08-26: 120 mg via INTRAVENOUS
  Filled 2017-08-26: qty 12

## 2017-08-26 MED ORDER — SODIUM CHLORIDE 0.9 % IV SOLN
Freq: Once | INTRAVENOUS | Status: AC
  Administered 2017-08-26: 10:00:00 via INTRAVENOUS

## 2017-08-26 MED ORDER — PALONOSETRON HCL INJECTION 0.25 MG/5ML
INTRAVENOUS | Status: AC
  Filled 2017-08-26: qty 5

## 2017-08-26 MED ORDER — PEGFILGRASTIM 6 MG/0.6ML ~~LOC~~ PSKT
6.0000 mg | PREFILLED_SYRINGE | Freq: Once | SUBCUTANEOUS | Status: AC
  Administered 2017-08-26: 6 mg via SUBCUTANEOUS
  Filled 2017-08-26: qty 0.6

## 2017-08-26 MED ORDER — SODIUM CHLORIDE 0.9 % IV SOLN: 10.0000 mg | Freq: Once | INTRAVENOUS | Status: DC

## 2017-08-26 MED ORDER — DIPHENHYDRAMINE HCL 25 MG PO CAPS
ORAL_CAPSULE | ORAL | Status: AC
  Filled 2017-08-26: qty 2

## 2017-08-26 MED ORDER — ACETAMINOPHEN 325 MG PO TABS
ORAL_TABLET | ORAL | Status: AC
  Filled 2017-08-26: qty 2

## 2017-08-26 MED ORDER — ACETAMINOPHEN 325 MG PO TABS
650.0000 mg | ORAL_TABLET | Freq: Once | ORAL | Status: AC
  Administered 2017-08-26: 650 mg via ORAL

## 2017-08-26 MED ORDER — DEXAMETHASONE SODIUM PHOSPHATE 10 MG/ML IJ SOLN
INTRAMUSCULAR | Status: AC
  Filled 2017-08-26: qty 1

## 2017-08-26 MED ORDER — DIPHENHYDRAMINE HCL 25 MG PO CAPS
50.0000 mg | ORAL_CAPSULE | Freq: Once | ORAL | Status: AC
  Administered 2017-08-26: 50 mg via ORAL

## 2017-08-26 MED ORDER — DEXAMETHASONE SODIUM PHOSPHATE 10 MG/ML IJ SOLN
10.0000 mg | Freq: Once | INTRAMUSCULAR | Status: AC
  Administered 2017-08-26: 10 mg via INTRAVENOUS

## 2017-08-26 MED ORDER — SODIUM CHLORIDE 0.9% FLUSH
10.0000 mL | INTRAVENOUS | Status: DC | PRN
  Administered 2017-08-26: 10 mL
  Filled 2017-08-26: qty 10

## 2017-08-26 MED ORDER — SODIUM CHLORIDE 0.9 % IV SOLN
348.0000 mg | Freq: Once | INTRAVENOUS | Status: AC
  Administered 2017-08-26: 350 mg via INTRAVENOUS
  Filled 2017-08-26: qty 35

## 2017-08-26 NOTE — Progress Notes (Signed)
Per Dr. Burr Medico, [place] to start Herceptin without recent lab values.   Wylene Simmer, BSN, RN 08/26/2017 10:00 AM

## 2017-08-26 NOTE — Patient Instructions (Signed)
[facility]charge Instructions for Patients Receiving Chemotherapy  Today you received the following chemotherapy agents: Herceptin, Taxotere, Carboplatin    To help prevent nausea and vomiting after your treatment, we encourage you to take your nausea medication as prescribed. NO ZOFRAN FOR 3 DAYS AFTER CHEMO.    If you develop nausea and vomiting that is not controlled by your nausea medication, call the clinic.   BELOW ARE SYMPTOMS THAT SHOULD BE REPORTED IMMEDIATELY:  *FEVER GREATER THAN 100.5 F  *CHILLS WITH OR WITHOUT FEVER  NAUSEA AND VOMITING THAT IS NOT CONTROLLED WITH YOUR NAUSEA MEDICATION  *UNUSUAL SHORTNESS OF BREATH  *UNUSUAL BRUISING OR BLEEDING  TENDERNESS IN MOUTH AND THROAT WITH OR WITHOUT PRESENCE OF ULCERS  *URINARY PROBLEMS  *BOWEL PROBLEMS  UNUSUAL RASH Items with * indicate a potential emergency and should be followed up as soon as possible.  Feel free to call the clinic should you have any questions or concerns. The clinic phone number is [phone].  Please show the [place] at check-in to the Emergency Department and triage nurse.   Trastuzumab injection for infusion What is this medicine? TRASTUZUMAB (tras TOO zoo mab) is a monoclonal antibody. It is used to treat breast cancer and stomach cancer. This medicine may be used for other purposes; ask your health care provider or pharmacist if you have questions. COMMON BRAND NAME(S): Herceptin What should I tell my health care provider before I take this medicine? They need to know if you have any of these conditions: -heart disease -heart failure -lung or breathing disease, like asthma -an unusual or allergic reaction to trastuzumab, benzyl alcohol, or other medications, foods, dyes, or preservatives -pregnant or trying to get pregnant -breast-feeding How should I use this medicine? This drug is given as an infusion into a vein. It is administered in a hospital or  clinic by a specially trained health care professional. Talk to your pediatrician regarding the use of this medicine in children. This medicine is not approved for use in children. Overdosage: If you think you have taken too much of this medicine contact a poison control center or emergency room at once. NOTE: This medicine is only for you. Do not share this medicine with others. What if I miss a dose? It is important not to miss a dose. Call your doctor or health care professional if you are unable to keep an appointment. What may interact with this medicine? This medicine may interact with the following medications: -certain types of chemotherapy, such as daunorubicin, doxorubicin, epirubicin, and idarubicin This list may not describe all possible interactions. Give your health care provider a list of all the medicines, herbs, non-prescription drugs, or dietary supplements you use. Also tell them if you smoke, drink alcohol, or use illegal drugs. Some items may interact with your medicine. What should I watch for while using this medicine? Visit your doctor for checks on your progress. Report any side effects. Continue your course of treatment even though you feel ill unless your doctor tells you to stop. Call your doctor or health care professional for advice if you get a fever, chills or sore throat, or other symptoms of a cold or flu. Do not treat yourself. Try to avoid being around people who are sick. You may experience fever, chills and shaking during your first infusion. These effects are usually mild and can be treated with other medicines. Report any side effects during the infusion to your health care professional. Fever and  chills usually do not happen with later infusions. Do not become pregnant while taking this medicine or for 7 months after stopping it. Women should inform their doctor if they wish to become pregnant or think they might be pregnant. Women of child-bearing potential  will need to have a negative pregnancy test before starting this medicine. There is a potential for serious side effects to an unborn child. Talk to your health care professional or pharmacist for more information. Do not breast-feed an infant while taking this medicine or for 7 months after stopping it. Women must use effective birth control with this medicine. What side effects may I notice from receiving this medicine? Side effects that you should report to your doctor or health care professional as soon as possible: -allergic reactions like skin rash, itching or hives, swelling of the face, lips, or tongue -chest pain or palpitations -cough -dizziness -feeling faint or lightheaded, falls -fever -general ill feeling or flu-like symptoms -signs of worsening heart failure like breathing problems; swelling in your legs and feet -unusually weak or tired Side effects that usually do not require medical attention (report to your doctor or health care professional if they continue or are bothersome): -bone pain -changes in taste -diarrhea -joint pain -nausea/vomiting -weight loss This list may not describe all possible side effects. Call your doctor for medical advice about side effects. You may report side effects to FDA at 1-800-FDA-1088. Where should I keep my medicine? This drug is given in a hospital or clinic and will not be stored at home. NOTE: This sheet is a summary. It may not cover all possible information. If you have questions about this medicine, talk to your doctor, pharmacist, or health care provider.  2018 Elsevier/Gold Standard (2016-10-05 14:37:52) Docetaxel injection What is this medicine? DOCETAXEL (doe se TAX el) is a chemotherapy drug. It targets fast dividing cells, like cancer cells, and causes these cells to die. This medicine is used to treat many types of cancers like breast cancer, certain stomach cancers, head and neck cancer, lung cancer, and prostate  cancer. This medicine may be used for other purposes; ask your health care provider or pharmacist if you have questions. COMMON BRAND NAME(S): Docefrez, Taxotere What should I tell my health care provider before I take this medicine? They need to know if you have any of these conditions: -infection (especially a virus infection such as chickenpox, cold sores, or herpes) -liver disease -low blood counts, like low white cell, platelet, or red cell counts -an unusual or allergic reaction to docetaxel, polysorbate 80, other chemotherapy agents, other medicines, foods, dyes, or preservatives -pregnant or trying to get pregnant -breast-feeding How should I use this medicine? This drug is given as an infusion into a vein. It is administered in a hospital or clinic by a specially trained health care professional. Talk to your pediatrician regarding the use of this medicine in children. Special care may be needed. Overdosage: If you think you have taken too much of this medicine contact a poison control center or emergency room at once. NOTE: This medicine is only for you. Do not share this medicine with others. What if I miss a dose? It is important not to miss your dose. Call your doctor or health care professional if you are unable to keep an appointment. What may interact with this medicine? -cyclosporine -erythromycin -ketoconazole -medicines to increase blood counts like filgrastim, pegfilgrastim, sargramostim -vaccines Talk to your doctor or health care professional before taking any of these  medicines: -acetaminophen -aspirin -ibuprofen -ketoprofen -naproxen This list may not describe all possible interactions. Give your health care provider a list of all the medicines, herbs, non-prescription drugs, or dietary supplements you use. Also tell them if you smoke, drink alcohol, or use illegal drugs. Some items may interact with your medicine. What should I watch for while using this  medicine? Your condition will be monitored carefully while you are receiving this medicine. You will need important blood work done while you are taking this medicine. This drug may make you feel generally unwell. This is not uncommon, as chemotherapy can affect healthy cells as well as cancer cells. Report any side effects. Continue your course of treatment even though you feel ill unless your doctor tells you to stop. In some cases, you may be given additional medicines to help with side effects. Follow all directions for their use. Call your doctor or health care professional for advice if you get a fever, chills or sore throat, or other symptoms of a cold or flu. Do not treat yourself. This drug decreases your body's ability to fight infections. Try to avoid being around people who are sick. This medicine may increase your risk to bruise or bleed. Call your doctor or health care professional if you notice any unusual bleeding. This medicine may contain alcohol in the product. You may get drowsy or dizzy. Do not drive, use machinery, or do anything that needs mental alertness until you know how this medicine affects you. Do not stand or sit up quickly, especially if you are an older patient. This reduces the risk of dizzy or fainting spells. Avoid alcoholic drinks. Do not become pregnant while taking this medicine. Women should inform their doctor if they wish to become pregnant or think they might be pregnant. There is a potential for serious side effects to an unborn child. Talk to your health care professional or pharmacist for more information. Do not breast-feed an infant while taking this medicine. What side effects may I notice from receiving this medicine? Side effects that you should report to your doctor or health care professional as soon as possible: -allergic reactions like skin rash, itching or hives, swelling of the face, lips, or tongue -low blood counts - This drug may decrease the  number of white blood cells, red blood cells and platelets. You may be at increased risk for infections and bleeding. -signs of infection - fever or chills, cough, sore throat, pain or difficulty passing urine -signs of decreased platelets or bleeding - bruising, pinpoint red spots on the skin, black, tarry stools, nosebleeds -signs of decreased red blood cells - unusually weak or tired, fainting spells, lightheadedness -breathing problems -fast or irregular heartbeat -low blood pressure -mouth sores -nausea and vomiting -pain, swelling, redness or irritation at the injection site -pain, tingling, numbness in the hands or feet -swelling of the ankle, feet, hands -weight gain Side effects that usually do not require medical attention (report to your doctor or health care professional if they continue or are bothersome): -bone pain -complete hair loss including hair on your head, underarms, pubic hair, eyebrows, and eyelashes -diarrhea -excessive tearing -changes in the color of fingernails -loosening of the fingernails -nausea -muscle pain -red flush to skin -sweating -weak or tired This list may not describe all possible side effects. Call your doctor for medical advice about side effects. You may report side effects to FDA at 1-800-FDA-1088. Where should I keep my medicine? This drug is given in a hospital  or clinic and will not be stored at home. NOTE: This sheet is a summary. It may not cover all possible information. If you have questions about this medicine, talk to your doctor, pharmacist, or health care provider.  2018 Elsevier/Gold Standard (2015-11-13 12:32:56) Carboplatin injection What is this medicine? CARBOPLATIN (KAR boe pla tin) is a chemotherapy drug. It targets fast dividing cells, like cancer cells, and causes these cells to die. This medicine is used to treat ovarian cancer and many other cancers. This medicine may be used for other purposes; ask your health care  provider or pharmacist if you have questions. COMMON BRAND NAME(S): Paraplatin What should I tell my health care provider before I take this medicine? They need to know if you have any of these conditions: -blood disorders -hearing problems -kidney disease -recent or ongoing radiation therapy -an unusual or allergic reaction to carboplatin, cisplatin, other chemotherapy, other medicines, foods, dyes, or preservatives -pregnant or trying to get pregnant -breast-feeding How should I use this medicine? This drug is usually given as an infusion into a vein. It is administered in a hospital or clinic by a specially trained health care professional. Talk to your pediatrician regarding the use of this medicine in children. Special care may be needed. Overdosage: If you think you have taken too much of this medicine contact a poison control center or emergency room at once. NOTE: This medicine is only for you. Do not share this medicine with others. What if I miss a dose? It is important not to miss a dose. Call your doctor or health care professional if you are unable to keep an appointment. What may interact with this medicine? -medicines for seizures -medicines to increase blood counts like filgrastim, pegfilgrastim, sargramostim -some antibiotics like amikacin, gentamicin, neomycin, streptomycin, tobramycin -vaccines Talk to your doctor or health care professional before taking any of these medicines: -acetaminophen -aspirin -ibuprofen -ketoprofen -naproxen This list may not describe all possible interactions. Give your health care provider a list of all the medicines, herbs, non-prescription drugs, or dietary supplements you use. Also tell them if you smoke, drink alcohol, or use illegal drugs. Some items may interact with your medicine. What should I watch for while using this medicine? Your condition will be monitored carefully while you are receiving this medicine. You will need  important blood work done while you are taking this medicine. This drug may make you feel generally unwell. This is not uncommon, as chemotherapy can affect healthy cells as well as cancer cells. Report any side effects. Continue your course of treatment even though you feel ill unless your doctor tells you to stop. In some cases, you may be given additional medicines to help with side effects. Follow all directions for their use. Call your doctor or health care professional for advice if you get a fever, chills or sore throat, or other symptoms of a cold or flu. Do not treat yourself. This drug decreases your body's ability to fight infections. Try to avoid being around people who are sick. This medicine may increase your risk to bruise or bleed. Call your doctor or health care professional if you notice any unusual bleeding. Be careful brushing and flossing your teeth or using a toothpick because you may get an infection or bleed more easily. If you have any dental work done, tell your dentist you are receiving this medicine. Avoid taking products that contain aspirin, acetaminophen, ibuprofen, naproxen, or ketoprofen unless instructed by your doctor. These medicines may hide a  fever. Do not become pregnant while taking this medicine. Women should inform their doctor if they wish to become pregnant or think they might be pregnant. There is a potential for serious side effects to an unborn child. Talk to your health care professional or pharmacist for more information. Do not breast-feed an infant while taking this medicine. What side effects may I notice from receiving this medicine? Side effects that you should report to your doctor or health care professional as soon as possible: -allergic reactions like skin rash, itching or hives, swelling of the face, lips, or tongue -signs of infection - fever or chills, cough, sore throat, pain or difficulty passing urine -signs of decreased platelets or  bleeding - bruising, pinpoint red spots on the skin, black, tarry stools, nosebleeds -signs of decreased red blood cells - unusually weak or tired, fainting spells, lightheadedness -breathing problems -changes in hearing -changes in vision -chest pain -high blood pressure -low blood counts - This drug may decrease the number of white blood cells, red blood cells and platelets. You may be at increased risk for infections and bleeding. -nausea and vomiting -pain, swelling, redness or irritation at the injection site -pain, tingling, numbness in the hands or feet -problems with balance, talking, walking -trouble passing urine or change in the amount of urine Side effects that usually do not require medical attention (report to your doctor or health care professional if they continue or are bothersome): -hair loss -loss of appetite -metallic taste in the mouth or changes in taste This list may not describe all possible side effects. Call your doctor for medical advice about side effects. You may report side effects to FDA at 1-800-FDA-1088. Where should I keep my medicine? This drug is given in a hospital or clinic and will not be stored at home. NOTE: This sheet is a summary. It may not cover all possible information. If you have questions about this medicine, talk to your doctor, pharmacist, or health care provider.  2018 Elsevier/Gold Standard (2008-01-16 14:38:05)

## 2017-08-29 ENCOUNTER — Telehealth: Payer: Self-pay | Admitting: *Deleted

## 2017-08-29 ENCOUNTER — Encounter: Payer: Self-pay | Admitting: Physical Therapy

## 2017-08-29 ENCOUNTER — Ambulatory Visit: Payer: Medicare HMO | Attending: General Surgery | Admitting: Physical Therapy

## 2017-08-29 DIAGNOSIS — Z9189 Other specified personal risk factors, not elsewhere classified: Secondary | ICD-10-CM | POA: Diagnosis present

## 2017-08-29 DIAGNOSIS — C50412 Malignant neoplasm of upper-outer quadrant of left female breast: Secondary | ICD-10-CM | POA: Diagnosis not present

## 2017-08-29 DIAGNOSIS — Z170 Estrogen receptor positive status [ER+]: Secondary | ICD-10-CM | POA: Insufficient documentation

## 2017-08-29 DIAGNOSIS — R293 Abnormal posture: Secondary | ICD-10-CM | POA: Insufficient documentation

## 2017-08-29 DIAGNOSIS — M25612 Stiffness of left shoulder, not elsewhere classified: Secondary | ICD-10-CM | POA: Diagnosis present

## 2017-08-29 NOTE — Telephone Encounter (Signed)
Pt called requesting to change next chemo appt from Friday  11/23  To  Monday  09/19/17 - due to pt will be out of town.   Requested subsequent chemo appts to be back on Friday  - after  11/26 - since this works better for her. Informed pt that she can discuss further with Dr. Burr Medico at her next office visit on  09/02/17.   Pt voiced understanding. Pt's    Phone     [phone].

## 2017-08-29 NOTE — Patient Instructions (Addendum)
Closed Chain: Shoulder Abduction / Adduction - on Wall    One hand on wall, step to side and return. Stepping causes shoulder to abduct and adduct. Step _5__ times, each side, _3__ times per day.  http://ss.exer.us/267   Copyright  VHI. All rights reserved.  Closed Chain: Shoulder Flexion / Extension - on Wall    Hands on wall, step backward. Return. Stepping causes shoulder flexion and extension Do __5_ times, each foot, __3_ times per day.  http://ss.exer.us/265   Copyright  VHI. All rights reserved.  MEDIAN NERVE: Mobilization XI    Stand with right palm flat on wall, fingers back, elbow bent, head tilted away. Sidestep away from wall, straightening elbow. Do _1__ sets of __3_ repetitions per session. Do _3__ sessions per day.  Copyright  VHI. All rights reserved.

## 2017-08-29 NOTE — Therapy (Addendum)
[facility] [place], [place], [address] Phone: [phone]   Fax:  [phone]  Physical Therapy Treatment  Patient Details  Name: Kathleen Hanson MRN: [id number] Date of Birth: November 24, 1935 Referring Provider: Dr. Autumn Messing   Encounter Date: 08/29/2017  PT End of Session - 08/29/17 2112    Visit Number  2    Number of Visits  8    Date for PT Re-Evaluation  09/26/17    PT Start Time  1350    PT Stop Time  1440    PT Time Calculation (min)  50 min    Activity Tolerance  Patient tolerated treatment well    Behavior During Therapy  [facility] for tasks assessed/performed       Past Medical History:  Diagnosis Date  . Cancer of left breast ([place])   . Constipation   . Family history of breast cancer   . Fibrocystic breast   . Hyperlipidemia   . Ocular migraine    "used to have them monthly for a period of time; seemed to stop; now have had a couple in the last week" (07/13/2017)    Past Surgical History:  Procedure Laterality Date  . BREAST BIOPSY Right ~ 2001   BREAST EXCISIONAL BIOPSY  . BREAST LUMPECTOMY Left 2001   chemo and radiation  . CESAREAN SECTION  X 1  . IR CATHETER TUBE CHANGE  07/20/2017  . MASTECTOMY COMPLETE / SIMPLE W/ SENTINEL NODE BIOPSY Left 07/13/2017    LEFT MASTECTOMY WITH SENTINEL NODE MAPPING ERAS PATHWAY [name] 07/13/2017  . PORTACATH PLACEMENT Right 07/13/2017  . TONSILLECTOMY      There were no vitals filed for this visit.  Subjective Assessment - 08/29/17 1357    Subjective  Patient underwent a left mastectomy and sentinel node biopsy (2 removed and both negative) on 07/13/17. She had a pneumothorax after surgery for her port-a-cath placement. That required a 9 day hospital stay. She began chemotherapy on 08/26/17 and had no problems with that. She will undergo anti-estrogen therapy after chemotherapy.    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 It measured 1.4 cm and is located in the upper outer quadrant with a Ki67 of 25%. She has a history of left breast cancer from 2001 and underwent a lumpectomy and sentinel node biopsy, chemotherapy and radiation. More recently she had a left mastectomy and sentinel node biopsy on 07/13/17. Chemo began 08/26/17.    Patient Stated Goals  Improve function on left arm.     Currently in Pain?  No/denies         [facility] PT Assessment - 08/29/17 0001      Assessment   Medical Diagnosis  s/p left mastectomy and SLNB    Referring Provider  Dr. Autumn Messing    Onset Date/Surgical Date  07/13/17    Hand Dominance  Right    Prior Therapy  none      Precautions   Precautions  Other (comment)    Precaution Comments  Left arm lymphedema risk      Restrictions   Weight Bearing Restrictions  No      Home Environment   Living Environment  Private residence    Living Arrangements  Spouse/significant other    Available Help at Discharge  Family      Prior Function   Level of [place]  Retired  Leisure  Currently walking 30 minutes      Cognition   Overall Cognitive Status  Within Functional Limits for tasks assessed      Posture/Postural Control   Posture/Postural Control  Postural limitations    Postural Limitations  Rounded Shoulders;Forward head      ROM / Strength   AROM / PROM / Strength  AROM;Strength      AROM   AROM Assessment Site  Shoulder;Cervical    Right/Left Shoulder  Right;Left    Left Shoulder Extension  45 Degrees    Left Shoulder Flexion  127 Degrees    Left Shoulder ABduction  131 Degrees    Left Shoulder Internal Rotation  77 Degrees    Left Shoulder External Rotation  84 Degrees      Palpation   Palpation comment  Palpable cording present in left axilla        LYMPHEDEMA/ONCOLOGY QUESTIONNAIRE - 08/29/17 1412      Type   Cancer Type  s/p left mastectomy SLNB      Surgeries    Mastectomy Date  07/13/17    Sentinel Lymph Node Biopsy Date  07/13/17    Number Lymph Nodes Removed  2      Treatment   Active Chemotherapy Treatment  Yes    Date  08/26/17    Past Chemotherapy Treatment  Yes    Date  10/26/99    Active Radiation Treatment  No    Past Radiation Treatment  Yes    Date  10/26/99    Body Site  left breast    Current Hormone Treatment  No    Past Hormone Therapy  Yes    Date  10/26/99    Drug Name  Tamoxifen      What other symptoms do you have   Are you Having Heaviness or Tightness  No    Are you having Pain  No    Are you having pitting edema  No    Is it Hard or Difficult finding clothes that fit  No    Do you have infections  No    Is there Decreased scar mobility  Yes    [place] Sign  No      Lymphedema Assessments   Lymphedema Assessments  Upper extremities      Right Upper Extremity Lymphedema   10 cm Proximal to Olecranon Process  24.5 cm    Olecranon Process  22.3 cm    10 cm Proximal to Ulnar Styloid Process  17.1 cm    Just Proximal to Ulnar Styloid Process  13.7 cm    Across Hand at [employer]  16.8 cm    At [place] of 2nd Digit  5.8 cm      Left Upper Extremity Lymphedema   10 cm Proximal to Olecranon Process  24.8 cm    Olecranon Process  22.4 cm    10 cm Proximal to Ulnar Styloid Process  16.8 cm    Just Proximal to Ulnar Styloid Process  13.4 cm    Across Hand at [employer]  16.6 cm    At [place] of 2nd Digit  5.6 cm        Quick Dash - 08/29/17 0001    Open a tight or new jar  Mild difficulty    Do heavy household chores (wash walls, wash floors)  No difficulty    Carry a shopping bag or briefcase  No difficulty    Wash your  back  No difficulty    Use a knife to cut food  Moderate difficulty    Recreational activities in which you take some force or impact through your arm, shoulder, or hand (golf, hammering, tennis)  No difficulty    During the past week, to what extent has your arm, shoulder or hand problem  interfered with your normal social activities with family, friends, neighbors, or groups?  Not at all    During the past week, to what extent has your arm, shoulder or hand problem limited your work or other regular daily activities  Not at all    Arm, shoulder, or hand pain.  None    Tingling (pins and needles) in your arm, shoulder, or hand  None    Difficulty Sleeping  No difficulty    DASH Score  6.82 %                    PT Education - 08/29/17 2111    Education provided  Yes    Education Details  Stretches for cording    Person(s) Educated  Patient    Methods  Explanation;Demonstration;Handout    Comprehension  Verbalized understanding;Returned demonstration;Verbal cues required         [facility] Goals - 08/29/17 2121      CC Long Term Goal  #1   Title  Patient will be independent with her home exercise program for increasing shoulder ROM.    Time  4    Period  Weeks    Status  New      CC Long Term Goal  #2   Title  Patient will increase left shoulder flexion to 143 degrees to regain pre-op ROM and reach overhead.    Baseline  127 degrees at eval    Time  4    Period  Weeks    Status  New      CC Long Term Goal  #3   Title  Increase left shoulder abduction to >/= 147 degrees to regain pre-op ROM and reach with greater ease.    Baseline  131 degrees at eval    Time  4    Period  Weeks    Status  New      CC Long Term Goal  #4   Title  Patient will report >/= 50% improvement in axillary cording.    Time  4    Period  Weeks    Status  New         Plan - 08/29/17 2112    Clinical Impression Statement  Patient underwent a left mastectomy and sentinel node biopsy (2 removed and both negative) on 07/13/17. She had a pneumothorax after surgery for her port-a-cath placement. That required a 9 day hospital stay. She began chemotherapy on 08/26/17 and had no problems with that. She will undergo anti-estrogen therapy after chemotherapy. She reports  some mild swelling in her lateral trunk just distal to her axilla and cording in her axilla. She has an area near her axilla that she is concerned about where she reports she can feel a lump. She is scheduled for an ultrasound on 08/31/17 to r/u other pathology. She will benefit from PT to reduce cording and regain the last 16 degrees of left shoulder flexion and abduction.    Clinical Presentation  Stable    Rehab Potential  Good    PT Frequency  2x / week    PT Duration  4  weeks    PT Treatment/Interventions  Patient/family education;Therapeutic exercise;ADLs/Self Care Home Management;Manual techniques;Manual lymph drainage;Taping;Scar mobilization;Passive range of motion    PT Next Visit Plan  2x/week was recommended but she had concerns about her copay. She has agreed to come to 2 visits to work on her cording in her axilla and try to regain shoulder flexion and abduction. Perform manual techniques to reduce cording and regain flexion and abduction.    PT Home Exercise Plan  Post op shoulder ROM HEP    Consulted and Agree with Plan of Care  Patient       Patient will benefit from skilled therapeutic intervention in order to improve the following deficits and impairments:  Postural dysfunction, Decreased knowledge of precautions, Pain, Impaired UE functional use, Decreased range of motion, Decreased scar mobility  Visit Diagnosis: Carcinoma of upper-outer quadrant of left breast in female, estrogen receptor positive ([place]) - Plan: PT plan of care cert/re-cert  Abnormal posture - Plan: PT plan of care cert/re-cert  Stiffness of left shoulder, not elsewhere classified - Plan: PT plan of care cert/re-cert  G-Codes completed - Quick DASH Goal status - CH (0% limitation) Discharge status - CI (1-20% limitation)   Problem List Patient Active Problem List   Diagnosis Date Noted  . Genetic testing 08/22/2017  . Family history of breast cancer   . Pneumothorax on right 07/18/2017  . Malignant  neoplasm of upper-outer quadrant of left breast in female, estrogen receptor positive ([place]) 06/30/2017   Annia Friendly, PT 08/29/17 9:32 PM  [place], [place], [address] Phone: [phone]   Fax:  [phone]  Name: Kathleen Hanson MRN: [id number] Date of Birth: [birth year]

## 2017-08-31 ENCOUNTER — Other Ambulatory Visit: Payer: Self-pay | Admitting: General Surgery

## 2017-08-31 ENCOUNTER — Ambulatory Visit
Admission: RE | Admit: 2017-08-31 | Discharge: 2017-08-31 | Disposition: A | Discharge status: Home / Self Care | Payer: Medicare HMO | Source: Ambulatory Visit | Attending: General Surgery | Admitting: General Surgery

## 2017-08-31 DIAGNOSIS — R2232 Localized swelling, mass and lump, left upper limb: Secondary | ICD-10-CM

## 2017-08-31 DIAGNOSIS — N63 Unspecified lump in unspecified breast: Secondary | ICD-10-CM

## 2017-08-31 NOTE — Progress Notes (Signed)
Please let the patient know the area that feels lumpy does look like surgical changes.

## 2017-09-01 ENCOUNTER — Ambulatory Visit: Payer: Medicare HMO | Admitting: Physical Therapy

## 2017-09-01 ENCOUNTER — Telehealth: Payer: Self-pay | Admitting: *Deleted

## 2017-09-01 NOTE — Telephone Encounter (Signed)
Pt called at  430 pm yesterday 08/31/17 and left message about " unpleasant side effects " from chemo  08/26/17.   Called pt back today with no answer.  Left message on voice mail requesting a call back from pt. Pt's   Phone     [phone].

## 2017-09-01 NOTE — Telephone Encounter (Signed)
Pt returned nurse call.  Was informed that yesterday , pt experienced pain in hip, arms, and shoulders.  Pt had chemo on Friday  08/26/17 with Neulasta Onpro for home.   Stated she called [facility] and was informed that pt could take Tylenol for the pain. Informed pt that Neulasta could cause the bone pain, joint pain.  Asked if pt had taken Claritin for a few days to help minimize the side effects of Neulasta, pt replied " NO,  I did not know anything about it ". Stated feeling better today.  Pain was better.  Pt has appts for 09/02/17.  Stated she would like to discuss more with Dr. Burr Medico about Neulasta and the use of claritin.   Pt also wanted to know about having a glass of wine while on chemo treatment. Pt's   Phone    [phone].

## 2017-09-01 NOTE — Progress Notes (Signed)
[facility]  [phone] [phone]  Clinic Follow Up Note   Patient Care Team: Beam, Russ Halo, MD as PCP - General (Family Medicine) [name], MD as Consulting Physician (General Surgery) [name], MD as Consulting Physician (Hematology) [name], MD as Consulting Physician (Radiation Oncology) 09/02/2017  CHIEF COMPLAINTS:  Follow up left breast cancer, pT2N0M0, triple positive     Malignant neoplasm of upper-outer quadrant of left breast in female, estrogen receptor positive ([place])   06/23/2017 Mammogram    Diagnostic mammogram 06/23/17 IMPRESSION: Indeterminate mass with irregular margins at the 2:30 position 4 cm from nipple measuring 1.4 x 0.8 x 1.2 cm in the upper-outer left breast.      06/28/2017 Initial Biopsy    Diagnosis 06/28/17 Breast, left, needle core biopsy, upper outer quadrant, 2:30 o'clock, 4cm from nipple - INVASIVE MAMMARY CARCINOMA WITH CALCIFICATIONS, SEE COMMENT.      06/28/2017 Receptors her2    Estrogen Receptor: 100%, POSITIVE, STRONG STAINING INTENSITY Progesterone Receptor: 20%, POSITIVE, STRONG STAINING INTENSITY Proliferation Marker Ki67: 25% HER2: Postive       06/30/2017 Initial Diagnosis    Malignant neoplasm of upper-outer quadrant of left breast in female, estrogen receptor positive ([place])      07/13/2017 Surgery    Left mastectomy with SLN biopsy performed by Dr Marlou Starks.       07/13/2017 Pathology Results    Diagnosis  1. Breast, simple mastectomy, Left - INVASIVE AND IN SITU DUCTAL CARCINOMA, 4 CM, MSBR GRADE 3. - MARGINS NOT INVOLVED. - SEE ONCOLOGY TABLE. 2. Lymph node, sentinel, biopsy, Left #1 - ONE BENIGN LYMPH NODE (0/1). 3. Lymph node, biopsy, Left #2 - ONE BENIGN LYMPH NODE (0/1).      07/18/2017 - 07/27/2017 Hospital Admission    Admit date: 07/18/17 Admission diagnosis: spontaneous PNX Additional comments: the patient was admitted with a pneumothorax several days after a port  was placed although her post procedure cxr was normal. She had a chest tube placed but had a persistent air leak. The pleurivac was placed to 40 cm h2o suction for several days and the pneumothorax finally resolved.       08/15/2017 Imaging    CT A/P IMPRESSION: 1. No CT findings to suggest metastatic disease involving the abdomen/pelvis. 2. Left chest wall fluid collection as discussed above. 3. Small right pleural effusion with minimal overlying atelectasis. 4. Cholelithiasis. 5. Small periumbilical abdominal wall hernia containing fat.       08/15/2017 Imaging    NM Whole Body Bone Scan FINDINGS: Uptake at the LEFT lateral aspects of the cervical and lower thoracic spine, typically degenerative.  Uptake at the shoulders, elbows, wrists, hips, and knees, typically degenerative.  Asymmetric uptake at the sternoclavicular joints RIGHT greater than LEFT which may also be degenerative.  No definite foci of abnormal osseous tracer accumulation are identified which are suspicious for osseous metastases.  Biconvex thoracolumbar scoliosis.  Expected urinary tract and soft tissue distribution of tracer.  IMPRESSION: Scattered likely degenerative type uptake as above.  No definite scintigraphic evidence of osseous metastatic disease.      08/16/2017 Genetic Testing    The patient had genetic testing due to a personal and family history of breast cancer.  The [employer] Multi-Cancer Panel was ordered. The Multi-Cancer Panel offered by [facility] includes sequencing and/or deletion duplication testing of the following 83 genes: ALK, APC, ATM, AXIN2,BAP1,  BARD1, BLM, BMPR1A, BRCA1, BRCA2, BRIP1, CASR, CDC73, CDH1, CDK4, CDKN1B, CDKN1C, CDKN2A (p14ARF), CDKN2A (p16INK4a),  CEBPA, CHEK2, CTNNA1, DICER1, DIS3L2, EGFR (c.2369C>T, p.Thr790Met variant only), EPCAM (Deletion/duplication testing only), FH, FLCN, GATA2, GPC3, GREM1 (Promoter region deletion/duplication testing only), HOXB13  (c.251G>A, p.Gly84Glu), HRAS, KIT, MAX, MEN1, MET, MITF (c.952G>A, p.Glu318Lys variant only), MLH1, MSH2, MSH3, MSH6, MUTYH, NBN, NF1, NF2, NTHL1, PALB2, PDGFRA, PHOX2B, PMS2, POLD1, POLE, POT1, PRKAR1A, PTCH1, PTEN, RAD50, RAD51C, RAD51D, RB1, RECQL4, RET, RUNX1, SDHAF2, SDHA (sequence changes only), SDHB, SDHC, SDHD, SMAD4, SMARCA4, SMARCB1, SMARCE1, STK11, SUFU, TERC, TERT, TMEM127, TP53, TSC1, TSC2, VHL, WRN and WT1.   Results: Negative, no pathogenic variants identified in the genes analyzed.  The date of this test report is 08/16/2017.         08/26/2017 -  Chemotherapy    adjuvant TCHP with Onpro q3weeks for 6 cycles started on 08/26/17 followed herceptin maintenance therapy and anti-estrogen therapy       HISTORY OF PRESENTING ILLNESS: 07/06/17 Raynald Blend 81 y.o. female is here because of newly diagnosed Malignant neoplasm of upper-outer quadrant of left breast in female, estrogen receptor positive. She presents to Breast Clinic alone today.   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 Her sister had breast cancer when she was 74. Her father had bladder cancer due to smoking. She denies any other significant medical history. She smoked from [age] yo, less than a pack.   Today she reports to feeling her lump after her mammogram and she does not have any pain.  She denies change in weight, appetite or pain. She has been active with weight watchers and has purposefully lost weight.  She did not ask husband to attend but he would if she needed him to. She has two children, daughter and adopted son. She is retired, but previously a Pharmacist, hospital. 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 She wears hearing aids.    GYN HISTORY  Menarchal: 13-14 LMP: early 77s in 1992 Contraceptive: No HRT: No GP: G2P1A1, prior miscarriage, Has daughter and adopted son   CURRENT THERAPY:  adjuvant TCHP with Onpro  q3weeks for 6 cycles started on 08/26/17 followed herceptin maintenance therapy    INTERVAL HISTORY:   RIMA BLIZZARD presents today after cycle one of [place]. He presents to the clinic today noting she is not doing well. 5 days after treatment she was hit with pain and fatigue. The pain started in her hip then went to her lower back, that pulsate at 5/10. She had an onpro patch and used as instructed. She had not taken Claritin for this. She contacted a nurse to ask for tylenol and she took that, no more than 300 mg. Afterward she was better but the overall she does not feel well still. She is worried about her quality of life while on chemotherapy. She would like to speak with others who have had this regimen before. Her appetite has reduced as well. She notes she tried to drink more fluids now. She drinks 4-5 glasses of water.  She notes increase in her stool output today but did not have BM for a few days. Normally she takes a daily laxative. She feels not well and she has heartburn. She does not take medication for this. She feels she does not need another 17 years of living like her last cancer treatment granted her. She cares most about quality of life.  She plans to visit [place] for Thanksgiving and would like to change her  next infusion.      MEDICAL HISTORY:  Past Medical History:  Diagnosis Date  . Cancer of left breast ([place])   . Constipation   . Family history of breast cancer   . Fibrocystic breast   . Hyperlipidemia   . Ocular migraine    "used to have them monthly for a period of time; seemed to stop; now have had a couple in the last week" (07/13/2017)    SURGICAL HISTORY: Past Surgical History:  Procedure Laterality Date  . BREAST BIOPSY Right ~ 2001   BREAST EXCISIONAL BIOPSY  . BREAST LUMPECTOMY Left 2001   chemo and radiation  . CESAREAN SECTION  X 1  . IR CATHETER TUBE CHANGE  07/20/2017  . MASTECTOMY COMPLETE / SIMPLE W/ SENTINEL NODE BIOPSY Left 07/13/2017     LEFT MASTECTOMY WITH SENTINEL NODE MAPPING ERAS PATHWAY [name] 07/13/2017  . PORTACATH PLACEMENT Right 07/13/2017  . TONSILLECTOMY      SOCIAL HISTORY: Social History   Socioeconomic History  . Marital status: Married    Spouse name: Not on file  . Number of children: Not on file  . Years of education: Not on file  . Highest education level: Not on file  Social Needs  . Financial resource strain: Not on file  . Food insecurity - worry: Not on file  . Food insecurity - inability: Not on file  . Transportation needs - medical: Not on file  . Transportation needs - non-medical: Not on file  Occupational History  . Not on file  Tobacco Use  . Smoking status: Former Smoker    Packs/day: 0.50    Years: 15.00    Pack years: 7.50    Types: Cigarettes    Last attempt to quit: 1976    Years since quitting: 42.8  . Smokeless tobacco: Never Used  Substance and Sexual Activity  . Alcohol use: Yes    Alcohol/week: 3.0 oz    Types: 5 Glasses of wine per week  . Drug use: No  . Sexual activity: Not Currently  Other Topics Concern  . Not on file  Social History Narrative  . Not on file    FAMILY HISTORY: Family History  Problem Relation Age of Onset  . Cancer Father        bladder cancer  . Breast cancer Sister 50    ALLERGIES:  has No Known Allergies.  MEDICATIONS:  Current Outpatient Medications  Medication Sig Dispense Refill  . aspirin 81 MG chewable tablet Chew 81 mg by mouth daily.     . cholecalciferol (VITAMIN D) 1000 units tablet Take 1,000 Units by mouth daily.    Marland Kitchen dexamethasone (DECADRON) 4 MG tablet Take 2 tablets (8 mg total) by mouth 2 (two) times daily. Start the day before Taxotere. Then again the day after chemo for 3 days. 30 tablet 1  . HYDROcodone-acetaminophen (NORCO/VICODIN) 5-325 MG tablet Take 1-2 tablets by mouth every 4 (four) hours as needed for moderate pain. (Patient not taking: Reported on 08/29/2017) 15 tablet 0  . lidocaine-prilocaine (EMLA)  cream Apply to affected area once 30 g 3  . Multiple Vitamin (MULTIVITAMIN WITH MINERALS) TABS tablet Take 1 tablet by mouth daily.    . ondansetron (ZOFRAN) 8 MG tablet Take 1 tablet (8 mg total) by mouth 2 (two) times daily as needed for refractory nausea / vomiting. Start on day 3 after chemo. 30 tablet 1  . polyethylene glycol powder (GLYCOLAX/MIRALAX) powder MIX 17 GRAMS IN LIQUID AND  DRINK DAILY IN THE EVENING    . prochlorperazine (COMPAZINE) 10 MG tablet Take 1 tablet (10 mg total) by mouth every 6 (six) hours as needed (Nausea or vomiting). 30 tablet 1  . simvastatin (ZOCOR) 40 MG tablet Take 40 mg daily by mouth.    . vitamin C (ASCORBIC ACID) 500 MG tablet Take 500 mg by mouth daily.     No current facility-administered medications for this visit.     REVIEW OF SYSTEMS:   Constitutional: Denies fevers, chills or abnormal night sweats (+) lower appetite  Eyes: Denies blurriness of vision, double vision or watery eyes Ears, nose, mouth, throat, and face: Denies mucositis or sore throat (+) wears hearing aids Respiratory: Denies cough, dyspnea or wheezes Cardiovascular: Denies palpitation, chest discomfort or lower extremity swelling Gastrointestinal:  Denies nausea or change in bowel habits (+) heartburn, increased stool output  Skin: Denies abnormal skin rashes Lymphatics: Denies new lymphadenopathy or easy bruising Neurological:Denies numbness, tingling or new weaknesses MSK: (+) bone pain in hip and lower back Behavioral/Psych: Mood is stable, no new changes  All other systems were reviewed with the patient and are negative.  PHYSICAL EXAMINATION: ECOG PERFORMANCE STATUS:1  Vitals:   09/02/17 1457  BP: (!) 159/49  Pulse: 85  Resp: 17  Temp: 98.4 F (36.9 C)  SpO2: 97%   Filed Weights   09/02/17 1457  Weight: 134 lb 11.2 oz (61.1 kg)    GENERAL:alert, no distress and comfortable SKIN: skin color, texture, turgor are normal, no rashes or significant lesions EYES:  normal, conjunctiva are pink and non-injected, sclera clear OROPHARYNX:no exudate, no erythema and lips, buccal mucosa, and tongue normal  NECK: supple, thyroid normal size, non-tender, without nodularity LYMPH:  no palpable lymphadenopathy in the cervical, axillary or inguinal LUNGS: clear to auscultation and percussion with normal breathing effort HEART: regular rate & rhythm and no murmurs and no lower extremity edema ABDOMEN:abdomen soft, non-tender and normal bowel sounds Musculoskeletal:no cyanosis of digits and no clubbing  PSYCH: alert & oriented x 3 with fluent speech NEURO: no focal motor/sensory deficits BREAST: exam deferred today    LABORATORY DATA:  I have reviewed the data as listed CBC Latest Ref Rng & Units 09/02/2017 08/26/2017 08/16/2017  WBC 3.9 - 10.3 10e3/uL 28.8(H) 3.9 4.9  Hemoglobin 11.6 - 15.9 g/dL 13.0 13.1 13.6  Hematocrit 34.8 - 46.6 % 39.2 39.1 41.2  Platelets 145 - 400 10e3/uL 141(L) 245 218    CMP Latest Ref Rng & Units 09/02/2017 08/26/2017 08/16/2017  Glucose 70 - 140 mg/dl 105 112 87  BUN 7.0 - 26.0 mg/dL 13.5 10.4 12.1  Creatinine 0.6 - 1.1 mg/dL 0.6 0.6 0.6  Sodium 136 - 145 mEq/L 133(L) 139 138  Potassium 3.5 - 5.1 mEq/L 4.0 3.7 3.7  Chloride 101 - 111 mmol/L - - -  CO2 22 - 29 mEq/L '27 26 25  ' Calcium 8.4 - 10.4 mg/dL 8.5 8.9 9.1  Total Protein 6.4 - 8.3 g/dL 5.9(L) 6.8 7.1  Total Bilirubin 0.20 - 1.20 mg/dL 0.25 0.41 0.46  Alkaline Phos 40 - 150 U/L 120 87 93  AST 5 - 34 U/L '24 16 19  ' ALT 0 - 55 U/L '20 16 16   ' PATHOLOGY  Diagnosis 07/13/17 1. Breast, simple mastectomy, Left - INVASIVE AND IN SITU DUCTAL CARCINOMA, 4 CM, MSBR GRADE 3. - MARGINS NOT INVOLVED. - SEE ONCOLOGY TABLE. 2. Lymph node, sentinel, biopsy, Left #1 - ONE BENIGN LYMPH NODE (0/1). 3. Lymph node, biopsy, Left #  2 - ONE BENIGN LYMPH NODE (0/1). Microscopic Comment 1. BREAST, INVASIVE TUMOR Procedure: Mastectomy and two sentinel lymph nodes Laterality: Left  breast Tumor Size: 4.0 cm Histologic Type: Ductal Grade: 3 Tubular Differentiation: 3 Nuclear Pleomorphism: 2 Mitotic Count: 2 Ductal Carcinoma in Situ (DCIS): Present, intermediate grade Extent of Tumor: Skin: Free of tumor Nipple: Free of tumor Skeletal muscle: Free of tumor Margins: Free of tumor Invasive carcinoma, distance from closest margin: 2 cm from posterior margin DCIS, distance from closest margin: 2 cm from posterior margin Regional Lymph Nodes: Number of Lymph Nodes Examined: 2 Number of Sentinel Lymph Nodes Examined: 2 Lymph Nodes with Macrometastases: 0 Lymph Nodes with Micrometastases: 0 1 of 3 FINAL for JULIANNA, VANWAGNER ([id number]) Microscopic Comment(continued) Lymph Nodes with Isolated Tumor Cells: 0 Breast Prognostic Profile: Case number [id number] Estrogen Receptor: 100%, positive, strong staining Progesterone Receptor: 20%, positive, strong staining Her2: Equivocal by FISH, ratio 1.49 and average copy number 4.2. Positive by immunohistochemistry (3+) Ki-67: 25% Best tumor block for sendout testing: 1C Pathologic Stage Classification (pTNM, AJCC 8th Edition): Primary Tumor (pT): pT2 Regional Lymph Nodes (pN): pN0 Distant Metastases (pM): pMX  Diagnosis 06/28/17 Breast, left, needle core biopsy, upper outer quadrant, 2:30 o'clock, 4cm from nipple - INVASIVE MAMMARY CARCINOMA WITH CALCIFICATIONS, SEE COMMENT. Microscopic Comment The carcinoma appears grade 3. E-cadherin will be ordered and reported in an addendum. Prognostic markers will be ordered. Dr. Melina Copa has reviewed the case. The case was called to [facility] on 06/29/2017. FLUORESCENCE IN-SITU HYBRIDIZATION Results: HER2 - *EQUIVOCAL* (Her2 by IHC ordered) RATIO OF HER2/CEP17 SIGNALS 1.49 AVERAGE HER2 COPY NUMBER PER CELL 4.20 Reference Range: NEGATIVE HER2/CEP17 Ratio <2.0 and average HER2 copy number <4.0 EQUIVOCAL HER2/CEP17 Ratio <2.0 and average HER2 copy number  >=4.0 and <6.0 POSITIVE HER2/CEP17 Ratio >=2.0 or <2.0 and average HER2 copy number >=6.0 PROGNOSTIC INDICATORS Results: IMMUNOHISTOCHEMICAL AND MORPHOMETRIC ANALYSIS PERFORMED MANUALLY Estrogen Receptor: 100%, POSITIVE, STRONG STAINING INTENSITY Progesterone Receptor: 20%, POSITIVE, STRONG STAINING INTENSITY Proliferation Marker Ki67: 25% 1 of 3 FINAL for COLBY, CATANESE [phone]) ADDITIONAL INFORMATION:(continued) REFERENCE RANGE ESTROGEN RECEPTOR NEGATIVE 0% POSITIVE =>1% REFERENCE RANGE PROGESTERONE RECEPTOR NEGATIVE 0% POSITIVE =>1% All controls stained appropriately    RADIOGRAPHIC STUDIES: I have personally reviewed the radiological images as listed and agreed with the findings in the report.  NM Whole Body Scan 08/15/17 FINDINGS: Uptake at the LEFT lateral aspects of the cervical and lower thoracic spine, typically degenerative.  Uptake at the shoulders, elbows, wrists, hips, and knees, typically degenerative.  Asymmetric uptake at the sternoclavicular joints RIGHT greater than LEFT which may also be degenerative.  No definite foci of abnormal osseous tracer accumulation are identified which are suspicious for osseous metastases.  Biconvex thoracolumbar scoliosis.  Expected urinary tract and soft tissue distribution of tracer.  IMPRESSION: Scattered likely degenerative type uptake as above.  No definite scintigraphic evidence of osseous metastatic disease.  CT A/P10/22/18 IMPRESSION: 1. No CT findings to suggest metastatic disease involving the abdomen/pelvis. 2. Left chest wall fluid collection as discussed above. 3. Small right pleural effusion with minimal overlying atelectasis. 4. Cholelithiasis. 5. Small periumbilical abdominal wall hernia containing fat.  Nm Bone Scan Whole Body  Result Date: 08/15/2017 CLINICAL DATA:  LEFT breast cancer EXAM: NUCLEAR MEDICINE WHOLE BODY BONE SCAN TECHNIQUE: Whole body anterior and posterior  images were obtained approximately 3 hours after intravenous injection of radiopharmaceutical. RADIOPHARMACEUTICALS:  21.4 mCi Technetium-22mMDP IV COMPARISON:  None Radiographic correlation: CT abdomen and pelvis 08/15/2017,  chest radiograph 07/27/2017 FINDINGS: Uptake at the LEFT lateral aspects of the cervical and lower thoracic spine, typically degenerative. Uptake at the shoulders, elbows, wrists, hips, and knees, typically degenerative. Asymmetric uptake at the sternoclavicular joints RIGHT greater than LEFT which may also be degenerative. No definite foci of abnormal osseous tracer accumulation are identified which are suspicious for osseous metastases. Biconvex thoracolumbar scoliosis. Expected urinary tract and soft tissue distribution of tracer. IMPRESSION: Scattered likely degenerative type uptake as above. No definite scintigraphic evidence of osseous metastatic disease. Electronically Signed   By: Lavonia Dana M.D.   On: 08/15/2017 15:12   Ct Abdomen Pelvis W Contrast  Result Date: 08/15/2017 CLINICAL DATA:  Left breast cancer with recent mastectomy (September 2018). Chemotherapy and radiation therapy complete. EXAM: CT ABDOMEN AND PELVIS WITH CONTRAST TECHNIQUE: Multidetector CT imaging of the abdomen and pelvis was performed using the standard protocol following bolus administration of intravenous contrast. CONTRAST:  100 cc Isovue-300 COMPARISON:  Chest CT 07/18/2017 FINDINGS: Lower chest: Small right pleural effusion with minimal overlying atelectasis. The heart is normal in size. No pericardial effusion. There is a large fluid collection in the left lower chest wall. This could be the bottom aspect of the breast prosthesis or a postoperative fluid collection. Recommend clinical correlation. Not present on prior chest CT from September 2018. No worrisome pulmonary lesions. Two sub 4 mm nodules noted just above the right hemidiaphragm. These are stable. Small hiatal hernia. Hepatobiliary: No  focal hepatic lesions or intrahepatic biliary dilatation. Gallbladder demonstrates numerous layering gallstones. No findings for acute cholecystitis. No common bile duct dilatation. Pancreas: No mass, inflammation or ductal dilatation. Spleen: Normal size.  No focal lesions. Adrenals/Urinary Tract: The adrenal glands and kidneys are unremarkable and stable. Stomach/Bowel: The stomach, duodenum, small bowel and colon are unremarkable. No acute inflammatory process, mass lesions or obstructive findings. Small hiatal hernia is noted. The terminal ileum is normal. Moderate sigmoid diverticulosis. Vascular/Lymphatic: Scattered aortic calcifications but no aneurysm or dissection. Dense renal artery ostial calcifications are noted bilaterally. No mesenteric or retroperitoneal mass or adenopathy. Reproductive: The uterus and ovaries are unremarkable. Other: No pelvic mass or adenopathy. No free pelvic fluid collections. No inguinal mass or adenopathy. Small periumbilical abdominal wall hernia containing fat. Musculoskeletal: No significant bony findings. Advanced lower lumbar facet disease and moderate degenerate disc disease at L5-S1. IMPRESSION: 1. No CT findings to suggest metastatic disease involving the abdomen/pelvis. 2. Left chest wall fluid collection as discussed above. 3. Small right pleural effusion with minimal overlying atelectasis. 4. Cholelithiasis. 5. Small periumbilical abdominal wall hernia containing fat. Electronically Signed   By: Marijo Sanes M.D.   On: 08/15/2017 11:14   [place] Axilla Left  Result Date: 08/31/2017 CLINICAL DATA:  81 year old female with recent history of left breast mastectomy for breast cancer. She had 2 sentinel left axillary lymph nodes removed at the time of mastectomy. She has previously had cancer in the left breast in 2001 for which she underwent lumpectomy, chemotherapy and radiation therapy. EXAM: ULTRASOUND OF THE LEFT AXILLA COMPARISON:  None. FINDINGS: On physical  exam,there is a firm smooth mass in the low left axilla spanning at least 5 cm. Ultrasound is performed, showing a complicated fluid collection which is predominantly anechoic measuring 5.2 x 3.9 x 3.5 cm. No blood flow was identified within the soft tissue component of the mass on color Doppler imaging. The appearance is most consistent with a postoperative seroma/resolving hematoma. IMPRESSION: The complex fluid collection in the left axilla is very likely  to represent a postoperative seroma/ resolving hematoma. RECOMMENDATION: Three-month follow-up left axillary ultrasound is recommended. I have discussed the findings and recommendations with the patient. Results were also provided in writing at the conclusion of the visit. If applicable, a reminder letter will be sent to the patient regarding the next appointment. BI-RADS CATEGORY  3: Probably benign. Electronically Signed   By: Ammie Ferrier M.D.   On: 08/31/2017 10:18    ASSESSMENT & PLAN:  Kathleen Hanson is a 81 y.o. caucasian female with a history of breast cancer, HLD, presented with screening discovered left breast cancer.  1. Malignant neoplasm of upper-outer quadrant of left breast in female, invasive ductal carcinoma, pT2N0M0, Stage: 1A, triple positive, Grade 3 -She underwent mastectomy on 07/13/17, she developed spontaneous PNX and on 07/18/17 she was admitted. She had a chest tube placed with pleuivac was placed to 40cm H2O suction for several days and her PNX resolved now and has since recovered well.  -I previously reviewed her surgical pathology results with patient in details. -Her primary tumor was 4 cm, grade 3, triple positive, surgical margins were negative, 2 sentinel lymph nodes were negative. -We discussed her invasive ductal tumor is early stage, but the Biology is more aggressive, due to HER2 positive, grade 3. She has moderate to high risk of recurrence after surgical resection. -She started adjuvant TCH with onpro on  08/26/17, with moderate side effects and overall not feeling well. I suggested medication regimens to help with this.  -I addressed her concern for quality of life while on chemotherapy. I explained if not tolerable we can reduce her dose or only do 4 treatments instead of 6.  -I suggest peer support group to have someone to talk to about her treatment.  -Pt requests to change her next treatment as this is the week she will be out of town for Thanksgiving.  -f/u on 11/26 or 11/27   2. 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 Bone Health -Osteopenia 03/27/2014, DEXA 2012 T=-1.9, 2015 T = -1.4;  -I encouraged her to take calcium and vitamin D supplement  4. Bone pain, heartburn, increased BM, fatigue, secondary to chemo treatment -she experienced bone pain from onpro. I suggest she take Claritin once daily for 5 days after chemo and no more than 319m of tylenol daily.  -I suggest she uses OTC Prilosec or Pepcid for heartburn, if not enough I can call in a prescription  -For her BM she can hold on her laxative tonight and if her BM continue to increase she can take imodium -Encouraged her to consider nutritional supplement, and to be physically active during the chemo  PLAN: -Move next scheduled Lab, flush, f/u and [place]ansonto 11/26 and 11/27 per pt's request     Orders Placed This Encounter  Procedures  . CBC with Differential    Standing Status:   Standing    Number of Occurrences:   50    Standing Expiration Date:   09/02/2022  . Comprehensive metabolic panel    Standing Status:   Standing    Number of Occurrences:   50    Standing Expiration Date:   09/02/2022    All questions were answered. The patient knows to call the clinic with any problems, questions or concerns. I spent 15 minutes counseling the  patient face to  face. The total time spent in the appointment was 20 minutes and more than 50% was on counseling.  This document serves as a record of services personally performed by [name], MD. It was created on her behalf by Joslyn Devon, a trained medical scribe. The creation of this record is based on the scribe's personal observations and the provider's statements to them.    I have reviewed the above documentation for accuracy and completeness, and I agree with the above.     [name], MD 09/03/2017

## 2017-09-02 ENCOUNTER — Telehealth: Payer: Self-pay | Admitting: Nurse Practitioner

## 2017-09-02 ENCOUNTER — Telehealth: Payer: Self-pay | Admitting: *Deleted

## 2017-09-02 ENCOUNTER — Ambulatory Visit (HOSPITAL_BASED_OUTPATIENT_CLINIC_OR_DEPARTMENT_OTHER): Payer: Medicare HMO | Admitting: Hematology

## 2017-09-02 ENCOUNTER — Ambulatory Visit (HOSPITAL_BASED_OUTPATIENT_CLINIC_OR_DEPARTMENT_OTHER): Payer: Medicare HMO

## 2017-09-02 ENCOUNTER — Other Ambulatory Visit (HOSPITAL_BASED_OUTPATIENT_CLINIC_OR_DEPARTMENT_OTHER): Payer: Medicare HMO

## 2017-09-02 VITALS — BP 159/49 | HR 85 | Temp 98.4°F | Resp 17 | Ht 64.0 in | Wt 134.7 lb

## 2017-09-02 DIAGNOSIS — Z86000 Personal history of in-situ neoplasm of breast: Secondary | ICD-10-CM

## 2017-09-02 DIAGNOSIS — R12 Heartburn: Secondary | ICD-10-CM | POA: Diagnosis not present

## 2017-09-02 DIAGNOSIS — Z803 Family history of malignant neoplasm of breast: Secondary | ICD-10-CM | POA: Diagnosis not present

## 2017-09-02 DIAGNOSIS — Z170 Estrogen receptor positive status [ER+]: Secondary | ICD-10-CM | POA: Diagnosis not present

## 2017-09-02 DIAGNOSIS — Z8052 Family history of malignant neoplasm of bladder: Secondary | ICD-10-CM

## 2017-09-02 DIAGNOSIS — R530 Neoplastic (malignant) related fatigue: Secondary | ICD-10-CM | POA: Diagnosis not present

## 2017-09-02 DIAGNOSIS — C50412 Malignant neoplasm of upper-outer quadrant of left female breast: Secondary | ICD-10-CM

## 2017-09-02 DIAGNOSIS — M898X9 Other specified disorders of bone, unspecified site: Secondary | ICD-10-CM | POA: Diagnosis not present

## 2017-09-02 LAB — COMPREHENSIVE METABOLIC PANEL
ALBUMIN: 3.2 g/dL — AB (ref 3.5–5.0)
ALK PHOS: 120 U/L (ref 40–150)
ALT: 20 U/L (ref 0–55)
ANION GAP: 8 meq/L (ref 3–11)
AST: 24 U/L (ref 5–34)
BILIRUBIN TOTAL: 0.25 mg/dL (ref 0.20–1.20)
BUN: 13.5 mg/dL (ref 7.0–26.0)
CO2: 27 meq/L (ref 22–29)
CREATININE: 0.6 mg/dL (ref 0.6–1.1)
Calcium: 8.5 mg/dL (ref 8.4–10.4)
Chloride: 97 mEq/L — ABNORMAL LOW (ref 98–109)
GLUCOSE: 105 mg/dL (ref 70–140)
Potassium: 4 mEq/L (ref 3.5–5.1)
SODIUM: 133 meq/L — AB (ref 136–145)
TOTAL PROTEIN: 5.9 g/dL — AB (ref 6.4–8.3)

## 2017-09-02 LAB — CBC WITH DIFFERENTIAL/PLATELET
BASO%: 0.2 % (ref 0.0–2.0)
BASOS ABS: 0 10*3/uL (ref 0.0–0.1)
EOS%: 0.6 % (ref 0.0–7.0)
Eosinophils Absolute: 0.2 10*3/uL (ref 0.0–0.5)
HEMATOCRIT: 39.2 % (ref 34.8–46.6)
HGB: 13 g/dL (ref 11.6–15.9)
LYMPH#: 1.8 10*3/uL (ref 0.9–3.3)
LYMPH%: 6.1 % — ABNORMAL LOW (ref 14.0–49.7)
MCH: 30.1 pg (ref 25.1–34.0)
MCHC: 33.2 g/dL (ref 31.5–36.0)
MCV: 90.4 fL (ref 79.5–101.0)
MONO#: 0.3 10*3/uL (ref 0.1–0.9)
MONO%: 1.1 % (ref 0.0–14.0)
NEUT#: 26.5 10*3/uL — ABNORMAL HIGH (ref 1.5–6.5)
NEUT%: 92 % — AB (ref 38.4–76.8)
Platelets: 141 10*3/uL — ABNORMAL LOW (ref 145–400)
RBC: 4.33 10*6/uL (ref 3.70–5.45)
RDW: 13.4 % (ref 11.2–14.5)
WBC: 28.8 10*3/uL — ABNORMAL HIGH (ref 3.9–10.3)

## 2017-09-02 MED ORDER — SODIUM CHLORIDE 0.9% FLUSH
10.0000 mL | Freq: Once | INTRAVENOUS | Status: AC
  Administered 2017-09-02: 10 mL via INTRAVENOUS
  Filled 2017-09-02: qty 10

## 2017-09-02 MED ORDER — HEPARIN SOD (PORK) LOCK FLUSH 100 UNIT/ML IV SOLN
500.0000 [IU] | Freq: Once | INTRAVENOUS | Status: AC
  Administered 2017-09-02: 500 [IU] via INTRAVENOUS
  Filled 2017-09-02: qty 5

## 2017-09-02 NOTE — Telephone Encounter (Signed)
Pt called requesting a call back from nurse - pt has more questions.   Called pt back on both cell and home phone without answer.  Unable to leave voice mail due to no voice mail came on.

## 2017-09-02 NOTE — Telephone Encounter (Signed)
Gave avs and calendar for November  °

## 2017-09-03 ENCOUNTER — Encounter: Payer: Self-pay | Admitting: Hematology

## 2017-09-05 ENCOUNTER — Ambulatory Visit: Payer: Medicare HMO | Admitting: Physical Therapy

## 2017-09-05 ENCOUNTER — Telehealth: Payer: Self-pay | Admitting: *Deleted

## 2017-09-05 NOTE — Telephone Encounter (Signed)
Left message for pt to return call regarding On-Call nurse call from 09/02/17.

## 2017-09-07 ENCOUNTER — Ambulatory Visit: Payer: Medicare HMO

## 2017-09-07 DIAGNOSIS — R293 Abnormal posture: Secondary | ICD-10-CM

## 2017-09-07 DIAGNOSIS — M25612 Stiffness of left shoulder, not elsewhere classified: Secondary | ICD-10-CM

## 2017-09-07 DIAGNOSIS — Z9189 Other specified personal risk factors, not elsewhere classified: Secondary | ICD-10-CM

## 2017-09-07 DIAGNOSIS — C50412 Malignant neoplasm of upper-outer quadrant of left female breast: Secondary | ICD-10-CM | POA: Diagnosis not present

## 2017-09-07 NOTE — Therapy (Addendum)
[place], [place], [address] Phone: [phone]   Fax:  [phone]  Physical Therapy Treatment  Patient Details  Name: Kathleen Hanson MRN: [id number] Date of Birth: 10-20-1936 Referring Provider: Dr. Autumn Messing   Encounter Date: 09/07/2017  PT End of Session - 09/07/17 1155    Visit Number  3    Number of Visits  8    Date for PT Re-Evaluation  09/26/17    PT Start Time  1114 Pt arrived late    PT Stop Time  1158    PT Time Calculation (min)  44 min    Activity Tolerance  Patient tolerated treatment well    Behavior During Therapy  [facility] for tasks assessed/performed       Past Medical History:  Diagnosis Date  . Cancer of left breast ([place])   . Constipation   . Family history of breast cancer   . Fibrocystic breast   . Hyperlipidemia   . Ocular migraine    "used to have them monthly for a period of time; seemed to stop; now have had a couple in the last week" (07/13/2017)    Past Surgical History:  Procedure Laterality Date  . BREAST BIOPSY Right ~ 2001   BREAST EXCISIONAL BIOPSY  . BREAST LUMPECTOMY Left 2001   chemo and radiation  . CESAREAN SECTION  X 1  . IR CATHETER TUBE CHANGE  07/20/2017  . MASTECTOMY COMPLETE / SIMPLE W/ SENTINEL NODE BIOPSY Left 07/13/2017    LEFT MASTECTOMY WITH SENTINEL NODE MAPPING ERAS PATHWAY [name] 07/13/2017  . PORTACATH PLACEMENT Right 07/13/2017  . TONSILLECTOMY      There were no vitals filed for this visit.  Subjective Assessment - 09/07/17 1117    Subjective  I feel like my head is stuffed with cotton and I've just been feeling like I have the flu since 11/7 (5 days after chemo treatment). My Rt shoulder has been bothering me a little bit but I think that's from using it more and I've been avoiding sleeping on my Lt side.  I didn't stretch much over the past week just because I haven't felt good. I did have the US of the knot in my Lt breast and  they said it's a seroma but they weren't going to drain it and they wanted to f/u in 3 months.     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 It measured 1.4 cm and is located in the upper outer quadrant with a Ki67 of 25%. She has a history of left breast cancer from 2001 and underwent a lumpectomy and sentinel node biopsy, chemotherapy and radiation. More recently she had a left mastectomy and sentinel node biopsy on 07/13/17. Chemo began 08/26/17.    Patient Stated Goals  Improve function on left arm.     Currently in Pain?  No/denies         [facility] PT Assessment - 09/07/17 0001      AROM   Left Shoulder Flexion  138 Degrees    Left Shoulder ABduction  131 Degrees                  [facility] Adult PT Treatment/Exercise - 09/07/17 0001      Shoulder Exercises: Pulleys   Flexion  2 minutes    Flexion Limitations  Tactile and VCs to decrease scapular compensation and then pt was able  to return correct demonstration.     ABduction  2 minutes    ABduction Limitations  Reminded pt to decrease scapular compensation then ale to return correct demonstration      Shoulder Exercises: Therapy Ball   Flexion  5 reps With forward lean into end of stretch    ABduction  5 reps Lt UE with side lean into end of stretch      Manual Therapy   Myofascial Release  To Lt axilla at area of cording    Passive ROM  In Supine to Lt shoulder into flexion, abduction, and er to pts tolerance.              PT Education - 09/07/17 1211    Education provided  Yes    Education Details  Encouraged pt to continue stretches she was given at last session and stretches from ABC class as she had reported not doing them over past week. Also instructedpt on how to "sprinkle"exercises into her day as well, like reaching into cabinet and holding higher shelf for a stretch and turning sideways, perform A/ROM every time she's in  front of the mirror and/or in a doorway.     Person(s) Educated  Patient    Methods  Explanation;Demonstration    Comprehension  Verbalized understanding;Returned demonstration         [facility] Goals - 08/29/17 2121      CC Long Term Goal  #1   Title  Patient will be independent with her home exercise program for increasing shoulder ROM.    Time  4    Period  Weeks    Status  New      CC Long Term Goal  #2   Title  Patient will increase left shoulder flexion to 143 degrees to regain pre-op ROM and reach overhead.    Baseline  127 degrees at eval    Time  4    Period  Weeks    Status  New      CC Long Term Goal  #3   Title  Increase left shoulder abduction to >/= 147 degrees to regain pre-op ROM and reach with greater ease.    Baseline  131 degrees at eval    Time  4    Period  Weeks    Status  New      CC Long Term Goal  #4   Title  Patient will report >/= 50% improvement in axillary cording.    Time  4    Period  Weeks    Status  New         Plan - 09/07/17 1216    Clinical Impression Statement  Pt tolerated stretching well today and reported feeling much looser after seesion today. Her cording was very minimally palpable and pt reports feeling this has improved. Pt was encouraged by working exercises in througout day/into ADLs reporting feeling like she can do this easily. Encouraged her to continue with initial HEP since she hasn't really done it yet. Her A/ROM flexion improved but her abduction remian unchanged. Pt back in 2 weeks.     Rehab Potential  Good    Clinical Impairments Affecting Rehab Potential  Hx previous breast cancer on same side    PT Frequency  2x / week    PT Duration  4 weeks    PT Treatment/Interventions  Patient/family education;Therapeutic exercise;ADLs/Self Care Home Management;Manual techniques;Manual lymph drainage;Taping;Scar mobilization;Passive range of motion    PT  Next Visit Plan  2x/week was recommended but she had concerns  about her copay. She has agreed to come to 2 visits to work on her cording in her axilla and try to regain shoulder flexion and abduction. Perform manual techniques to reduce cording and regain flexion and abduction. Consider adding supine scapular series for postural strenghtening with yelow theraband.     Consulted and Agree with Plan of Care  Patient       Patient will benefit from skilled therapeutic intervention in order to improve the following deficits and impairments:  Postural dysfunction, Decreased knowledge of precautions, Pain, Impaired UE functional use, Decreased range of motion, Decreased scar mobility  Visit Diagnosis: Abnormal posture  Stiffness of left shoulder, not elsewhere classified  At risk for lymphedema     Problem List Patient Active Problem List   Diagnosis Date Noted  . Genetic testing 08/22/2017  . Family history of breast cancer   . Pneumothorax on right 07/18/2017  . Malignant neoplasm of upper-outer quadrant of left breast in female, estrogen receptor positive ([place]) 06/30/2017    Otelia Limes, PTA 09/07/2017, 12:21 PM  [place], [place], [address] Phone: [phone]   Fax:  [phone]  Name: Kathleen Hanson MRN: [id number] Date of Birth: 09-16-1936  PHYSICAL THERAPY DISCHARGE SUMMARY  Visits from Start of Care: 3  Current functional level related to goals / functional outcomes: Unknown. Patient did not return after 2 treatments due to concerns about her copay.   Remaining deficits: Unknown   Education / Equipment: HEP  Plan: Patient agrees to discharge.  Patient goals were partially met. Patient is being discharged due to not returning since the last visit.  ?????         Annia Friendly, [place] 12/15/17 12:18 PM

## 2017-09-16 ENCOUNTER — Ambulatory Visit: Payer: Medicare HMO

## 2017-09-16 ENCOUNTER — Ambulatory Visit: Payer: Medicare HMO | Admitting: Nurse Practitioner

## 2017-09-16 ENCOUNTER — Other Ambulatory Visit: Payer: Medicare HMO

## 2017-09-19 ENCOUNTER — Ambulatory Visit: Payer: Medicare HMO | Admitting: Nurse Practitioner

## 2017-09-19 ENCOUNTER — Other Ambulatory Visit: Payer: Medicare HMO

## 2017-09-19 ENCOUNTER — Ambulatory Visit: Payer: Medicare HMO

## 2017-09-20 ENCOUNTER — Encounter: Payer: Self-pay | Admitting: Nurse Practitioner

## 2017-09-20 ENCOUNTER — Ambulatory Visit (HOSPITAL_BASED_OUTPATIENT_CLINIC_OR_DEPARTMENT_OTHER): Payer: Medicare HMO

## 2017-09-20 ENCOUNTER — Ambulatory Visit: Payer: Medicare HMO

## 2017-09-20 ENCOUNTER — Other Ambulatory Visit (HOSPITAL_BASED_OUTPATIENT_CLINIC_OR_DEPARTMENT_OTHER): Payer: Medicare HMO

## 2017-09-20 ENCOUNTER — Ambulatory Visit (HOSPITAL_BASED_OUTPATIENT_CLINIC_OR_DEPARTMENT_OTHER): Payer: Medicare HMO | Admitting: Nurse Practitioner

## 2017-09-20 ENCOUNTER — Telehealth: Payer: Self-pay | Admitting: Hematology

## 2017-09-20 VITALS — BP 149/70 | HR 95 | Temp 98.5°F | Resp 20 | Ht 64.0 in | Wt 135.5 lb

## 2017-09-20 DIAGNOSIS — Z5111 Encounter for antineoplastic chemotherapy: Secondary | ICD-10-CM | POA: Diagnosis not present

## 2017-09-20 DIAGNOSIS — C50412 Malignant neoplasm of upper-outer quadrant of left female breast: Secondary | ICD-10-CM

## 2017-09-20 DIAGNOSIS — Z170 Estrogen receptor positive status [ER+]: Principal | ICD-10-CM

## 2017-09-20 DIAGNOSIS — Z5112 Encounter for antineoplastic immunotherapy: Secondary | ICD-10-CM

## 2017-09-20 DIAGNOSIS — R739 Hyperglycemia, unspecified: Secondary | ICD-10-CM

## 2017-09-20 DIAGNOSIS — R030 Elevated blood-pressure reading, without diagnosis of hypertension: Secondary | ICD-10-CM | POA: Diagnosis not present

## 2017-09-20 DIAGNOSIS — L7634 Postprocedural seroma of skin and subcutaneous tissue following other procedure: Secondary | ICD-10-CM

## 2017-09-20 LAB — CBC WITH DIFFERENTIAL/PLATELET
BASO%: 0 % (ref 0.0–2.0)
Basophils Absolute: 0 10*3/uL (ref 0.0–0.1)
EOS%: 0 % (ref 0.0–7.0)
Eosinophils Absolute: 0 10*3/uL (ref 0.0–0.5)
HCT: 39.8 % (ref 34.8–46.6)
HGB: 13.3 g/dL (ref 11.6–15.9)
LYMPH%: 6.8 % — AB (ref 14.0–49.7)
MCH: 31.3 pg (ref 25.1–34.0)
MCHC: 33.4 g/dL (ref 31.5–36.0)
MCV: 93.6 fL (ref 79.5–101.0)
MONO#: 0 10*3/uL — ABNORMAL LOW (ref 0.1–0.9)
MONO%: 0.3 % (ref 0.0–14.0)
NEUT#: 3.6 10*3/uL (ref 1.5–6.5)
NEUT%: 92.9 % — AB (ref 38.4–76.8)
Platelets: 218 10*3/uL (ref 145–400)
RBC: 4.25 10*6/uL (ref 3.70–5.45)
RDW: 14.5 % (ref 11.2–14.5)
WBC: 3.9 10*3/uL (ref 3.9–10.3)
lymph#: 0.3 10*3/uL — ABNORMAL LOW (ref 0.9–3.3)

## 2017-09-20 LAB — COMPREHENSIVE METABOLIC PANEL
ALK PHOS: 106 U/L (ref 40–150)
ALT: 29 U/L (ref 0–55)
ANION GAP: 14 meq/L — AB (ref 3–11)
AST: 21 U/L (ref 5–34)
Albumin: 3.9 g/dL (ref 3.5–5.0)
BUN: 14.1 mg/dL (ref 7.0–26.0)
CALCIUM: 9.2 mg/dL (ref 8.4–10.4)
CHLORIDE: 102 meq/L (ref 98–109)
CO2: 23 mEq/L (ref 22–29)
Creatinine: 0.7 mg/dL (ref 0.6–1.1)
EGFR: >60 mL/min/{1.73_m2} (ref >60)
Glucose: 191 mg/dl — ABNORMAL HIGH (ref 70–140)
POTASSIUM: 3.9 meq/L (ref 3.5–5.1)
Sodium: 138 mEq/L (ref 136–145)
Total Bilirubin: 0.4 mg/dL (ref 0.20–1.20)
Total Protein: 7.3 g/dL (ref 6.4–8.3)

## 2017-09-20 MED ORDER — PALONOSETRON HCL INJECTION 0.25 MG/5ML
0.2500 mg | Freq: Once | INTRAVENOUS | Status: AC
  Administered 2017-09-20: 0.25 mg via INTRAVENOUS

## 2017-09-20 MED ORDER — HEPARIN SOD (PORK) LOCK FLUSH 100 UNIT/ML IV SOLN
500.0000 [IU] | Freq: Once | INTRAVENOUS | Status: AC | PRN
  Administered 2017-09-20: 500 [IU]
  Filled 2017-09-20: qty 5

## 2017-09-20 MED ORDER — ACETAMINOPHEN 325 MG PO TABS
ORAL_TABLET | ORAL | Status: AC
  Filled 2017-09-20: qty 2

## 2017-09-20 MED ORDER — SODIUM CHLORIDE 0.9 % IV SOLN
Freq: Once | INTRAVENOUS | Status: AC
  Administered 2017-09-20: 13:00:00 via INTRAVENOUS
  Filled 2017-09-20: qty 5

## 2017-09-20 MED ORDER — SODIUM CHLORIDE 0.9 % IV SOLN
350.0000 mg | Freq: Once | INTRAVENOUS | Status: AC
  Administered 2017-09-20: 350 mg via INTRAVENOUS
  Filled 2017-09-20: qty 35

## 2017-09-20 MED ORDER — DIPHENHYDRAMINE HCL 25 MG PO CAPS
50.0000 mg | ORAL_CAPSULE | Freq: Once | ORAL | Status: AC
  Administered 2017-09-20: 50 mg via ORAL

## 2017-09-20 MED ORDER — SODIUM CHLORIDE 0.9% FLUSH
10.0000 mL | Freq: Once | INTRAVENOUS | Status: DC
  Filled 2017-09-20: qty 10

## 2017-09-20 MED ORDER — TRASTUZUMAB CHEMO 150 MG IV SOLR
6.0000 mg/kg | Freq: Once | INTRAVENOUS | Status: AC
  Administered 2017-09-20: 357 mg via INTRAVENOUS
  Filled 2017-09-20: qty 17

## 2017-09-20 MED ORDER — SODIUM CHLORIDE 0.9 % IV SOLN
Freq: Once | INTRAVENOUS | Status: AC
  Administered 2017-09-20: 12:00:00 via INTRAVENOUS

## 2017-09-20 MED ORDER — DIPHENHYDRAMINE HCL 25 MG PO CAPS
ORAL_CAPSULE | ORAL | Status: AC
  Filled 2017-09-20: qty 2

## 2017-09-20 MED ORDER — SODIUM CHLORIDE 0.9% FLUSH
10.0000 mL | INTRAVENOUS | Status: DC | PRN
  Administered 2017-09-20: 10 mL
  Filled 2017-09-20: qty 10

## 2017-09-20 MED ORDER — PEGFILGRASTIM 6 MG/0.6ML ~~LOC~~ PSKT
6.0000 mg | PREFILLED_SYRINGE | Freq: Once | SUBCUTANEOUS | Status: AC
  Administered 2017-09-20: 6 mg via SUBCUTANEOUS
  Filled 2017-09-20: qty 0.6

## 2017-09-20 MED ORDER — PALONOSETRON HCL INJECTION 0.25 MG/5ML
INTRAVENOUS | Status: AC
  Filled 2017-09-20: qty 5

## 2017-09-20 MED ORDER — SODIUM CHLORIDE 0.9 % IV SOLN
75.0000 mg/m2 | Freq: Once | INTRAVENOUS | Status: AC
  Administered 2017-09-20: 120 mg via INTRAVENOUS
  Filled 2017-09-20: qty 12

## 2017-09-20 MED ORDER — ACETAMINOPHEN 325 MG PO TABS
650.0000 mg | ORAL_TABLET | Freq: Once | ORAL | Status: AC
  Administered 2017-09-20: 650 mg via ORAL

## 2017-09-20 NOTE — Progress Notes (Signed)
[place]  Telephone:([phone]) [phone] Fax:(336) [phone]  Clinic Follow up Note   Patient Care Team: Beam, Russ Halo, MD as PCP - General (Family Medicine) [name], MD as Consulting Physician (General Surgery) [name], MD as Consulting Physician (Hematology) [name], MD as Consulting Physician (Radiation Oncology) 09/20/2017  CHIEF COMPLAINTS:  Follow up left breast cancer, pT2N0M0, triple positive   SUMMARY OF ONCOLOGIC HISTORY:   Malignant neoplasm of upper-outer quadrant of left breast in female, estrogen receptor positive ([place])   06/23/2017 Mammogram    Diagnostic mammogram 06/23/17 IMPRESSION: Indeterminate mass with irregular margins at the 2:30 position 4 cm from nipple measuring 1.4 x 0.8 x 1.2 cm in the upper-outer left breast.      06/28/2017 Initial Biopsy    Diagnosis 06/28/17 Breast, left, needle core biopsy, upper outer quadrant, 2:30 o'clock, 4cm from nipple - INVASIVE MAMMARY CARCINOMA WITH CALCIFICATIONS, SEE COMMENT.      06/28/2017 Receptors her2    Estrogen Receptor: 100%, POSITIVE, STRONG STAINING INTENSITY Progesterone Receptor: 20%, POSITIVE, STRONG STAINING INTENSITY Proliferation Marker Ki67: 25% HER2: Postive       06/30/2017 Initial Diagnosis    Malignant neoplasm of upper-outer quadrant of left breast in female, estrogen receptor positive ([place])      07/13/2017 Surgery    Left mastectomy with SLN biopsy performed by Dr Marlou Starks.       07/13/2017 Pathology Results    Diagnosis  1. Breast, simple mastectomy, Left - INVASIVE AND IN SITU DUCTAL CARCINOMA, 4 CM, MSBR GRADE 3. - MARGINS NOT INVOLVED. - SEE ONCOLOGY TABLE. 2. Lymph node, sentinel, biopsy, Left #1 - ONE BENIGN LYMPH NODE (0/1). 3. Lymph node, biopsy, Left #2 - ONE BENIGN LYMPH NODE (0/1).      07/18/2017 - 07/27/2017 Hospital Admission    Admit date: 07/18/17 Admission diagnosis: spontaneous PNX Additional comments: the patient was admitted with a  pneumothorax several days after a port was placed although her post procedure cxr was normal. She had a chest tube placed but had a persistent air leak. The pleurivac was placed to 40 cm h2o suction for several days and the pneumothorax finally resolved.       08/15/2017 Imaging    CT A/P IMPRESSION: 1. No CT findings to suggest metastatic disease involving the abdomen/pelvis. 2. Left chest wall fluid collection as discussed above. 3. Small right pleural effusion with minimal overlying atelectasis. 4. Cholelithiasis. 5. Small periumbilical abdominal wall hernia containing fat.       08/15/2017 Imaging    NM Whole Body Bone Scan FINDINGS: Uptake at the LEFT lateral aspects of the cervical and lower thoracic spine, typically degenerative.  Uptake at the shoulders, elbows, wrists, hips, and knees, typically degenerative.  Asymmetric uptake at the sternoclavicular joints RIGHT greater than LEFT which may also be degenerative.  No definite foci of abnormal osseous tracer accumulation are identified which are suspicious for osseous metastases.  Biconvex thoracolumbar scoliosis.  Expected urinary tract and soft tissue distribution of tracer.  IMPRESSION: Scattered likely degenerative type uptake as above.  No definite scintigraphic evidence of osseous metastatic disease.      08/16/2017 Genetic Testing    The patient had genetic testing due to a personal and family history of breast cancer.  The [employer] Multi-Cancer Panel was ordered. The Multi-Cancer Panel offered by [employer] includes sequencing and/or deletion duplication testing of the following 83 genes: ALK, APC, ATM, AXIN2,BAP1,  BARD1, BLM, BMPR1A, BRCA1, BRCA2, BRIP1, CASR, CDC73, CDH1, CDK4, CDKN1B, CDKN1C,  CDKN2A (p14ARF), CDKN2A (p16INK4a), CEBPA, CHEK2, CTNNA1, DICER1, DIS3L2, EGFR (c.2369C>T, p.Thr790Met variant only), EPCAM (Deletion/duplication testing only), FH, FLCN, GATA2, GPC3, GREM1 (Promoter region  deletion/duplication testing only), HOXB13 (c.251G>A, p.Gly84Glu), HRAS, KIT, MAX, MEN1, MET, MITF (c.952G>A, p.Glu318Lys variant only), MLH1, MSH2, MSH3, MSH6, MUTYH, NBN, NF1, NF2, NTHL1, PALB2, PDGFRA, PHOX2B, PMS2, POLD1, POLE, POT1, PRKAR1A, PTCH1, PTEN, RAD50, RAD51C, RAD51D, RB1, RECQL4, RET, RUNX1, SDHAF2, SDHA (sequence changes only), SDHB, SDHC, SDHD, SMAD4, SMARCA4, SMARCB1, SMARCE1, STK11, SUFU, TERC, TERT, TMEM127, TP53, TSC1, TSC2, VHL, WRN and WT1.   Results: Negative, no pathogenic variants identified in the genes analyzed.  The date of this test report is 08/16/2017.         08/26/2017 -  Chemotherapy    adjuvant TCHP with Onpro q3weeks for 6 cycles started on 08/26/17 followed herceptin maintenance therapy and anti-estrogen therapy      08/31/2017 Breast US    On physical exam,there is a firm smooth mass in the low left axilla spanning at least 5 cm.  Ultrasound is performed, showing a complicated fluid collection which is predominantly anechoic measuring 5.2 x 3.9 x 3.5 cm. No blood flow was identified within the soft tissue component of the mass on color Doppler imaging. The appearance is most consistent with a postoperative seroma/resolving hematoma.  IMPRESSION: The complex fluid collection in the left axilla is very likely to represent a postoperative seroma/ resolving hematoma.  RECOMMENDATION: Three-month follow-up left axillary ultrasound is recommended.      CURRENT THERAPY:  adjuvant TCHP with Onpro q3weeks for 6 cycles started on 08/26/17 followed herceptin maintenance therapy    INTERVAL HISTORY: Kathleen Hanson returns today for f/u prior to cycle 2 TCHP as scheduled. S/p cycle 1 on 08/26/17 which she tolerated well overall. On day 5 after chemo she developed flu-like body aches with bone pain to hips and back. She did not take claritin, resolved with tylenol PRN. She had [place] on 08/31/17 to evaluate left axillary mass. This morning she took 8 mg decadron  accidentally. She otherwise feels well and ready for next cycle.   REVIEW OF SYSTEMS:   Constitutional: Denies fatigue, fevers, chills or abnormal weight loss (+) good appetite   Eyes: Denies blurriness of vision Ears, nose, mouth, throat, and face: Denies mucositis or sore throat Respiratory: Denies cough, dyspnea or wheezes Cardiovascular: Denies palpitation, chest discomfort or lower extremity swelling Gastrointestinal:  Denies nausea, vomiting, constipation, diarrhea, heartburn or change in bowel habits Skin: Denies abnormal skin rashes Lymphatics: Denies new lymphadenopathy or easy bruising Neurological:Denies numbness, tingling or new weaknesses Behavioral/Psych: Mood is stable, no new changes  All other systems were reviewed with the patient and are negative.  MEDICAL HISTORY:  Past Medical History:  Diagnosis Date  . Cancer of left breast ([place])   . Constipation   . Family history of breast cancer   . Fibrocystic breast   . Hyperlipidemia   . Ocular migraine    "used to have them monthly for a period of time; seemed to stop; now have had a couple in the last week" (07/13/2017)    SURGICAL HISTORY: Past Surgical History:  Procedure Laterality Date  . BREAST BIOPSY Right ~ 2001   BREAST EXCISIONAL BIOPSY  . BREAST LUMPECTOMY Left 2001   chemo and radiation  . CESAREAN SECTION  X 1  . IR CATHETER TUBE CHANGE  07/20/2017  . MASTECTOMY COMPLETE / SIMPLE W/ SENTINEL NODE BIOPSY Left 07/13/2017    LEFT MASTECTOMY WITH SENTINEL NODE MAPPING ERAS PATHWAY /  notes 07/13/2017  . MASTECTOMY W/ SENTINEL NODE BIOPSY Left 07/13/2017   Procedure: LEFT MASTECTOMY WITH SENTINEL NODE MAPPING ERAS PATHWAY;  Surgeon: [name], MD;  Location: [facility];  Service: General;  Laterality: Left;  . PORTACATH PLACEMENT Right 07/13/2017  . PORTACATH PLACEMENT Right 07/13/2017   Procedure: INSERTION PORT-A-CATH;  Surgeon: [name], MD;  Location: [facility];  Service: General;  Laterality: Right;  .  TONSILLECTOMY      I have reviewed the social history and family history with the patient and they are unchanged from previous note.  ALLERGIES:  has No Known Allergies.  MEDICATIONS:  Current Outpatient Medications  Medication Sig Dispense Refill  . aspirin 81 MG tablet Chew 81 mg by mouth daily.     . cholecalciferol (VITAMIN D) 1000 units tablet Take 1,000 Units by mouth daily.    Marland Kitchen dexamethasone (DECADRON) 4 MG tablet Take 2 tablets (8 mg total) by mouth 2 (two) times daily. Start the day before Taxotere. Then again the day after chemo for 3 days. 30 tablet 1  . lidocaine-prilocaine (EMLA) cream Apply to affected area once 30 g 3  . Multiple Vitamin (MULTIVITAMIN WITH MINERALS) TABS tablet Take 1 tablet by mouth daily.    . polyethylene glycol powder (GLYCOLAX/MIRALAX) powder MIX 17 GRAMS IN LIQUID AND DRINK DAILY IN THE EVENING    . simvastatin (ZOCOR) 40 MG tablet Take 40 mg daily by mouth.    . vitamin C (ASCORBIC ACID) 500 MG tablet Take 500 mg by mouth daily.    Marland Kitchen HYDROcodone-acetaminophen (NORCO/VICODIN) 5-325 MG tablet Take 1-2 tablets by mouth every 4 (four) hours as needed for moderate pain. (Patient not taking: Reported on 09/20/2017) 15 tablet 0  . ondansetron (ZOFRAN) 8 MG tablet Take 1 tablet (8 mg total) by mouth 2 (two) times daily as needed for refractory nausea / vomiting. Start on day 3 after chemo. (Patient not taking: Reported on 09/20/2017) 30 tablet 1  . prochlorperazine (COMPAZINE) 10 MG tablet Take 1 tablet (10 mg total) by mouth every 6 (six) hours as needed (Nausea or vomiting). (Patient not taking: Reported on 09/20/2017) 30 tablet 1   No current facility-administered medications for this visit.    Facility-Administered Medications Ordered in Other Visits  Medication Dose Route Frequency Provider Last Rate Last Dose  . CARBOplatin (PARAPLATIN) 350 mg in sodium chloride 0.9 % 250 mL chemo infusion  350 mg Intravenous Once [name], MD      . DOCEtaxel  (TAXOTERE) 120 mg in sodium chloride 0.9 % 250 mL chemo infusion  75 mg/m2 (Treatment Plan Recorded) Intravenous Once [name], MD 262 mL/hr at 09/20/17 1507 120 mg at 09/20/17 1507  . heparin lock flush 100 unit/mL  500 Units Intracatheter Once PRN [name], MD      . pegfilgrastim (NEULASTA ONPRO KIT) injection 6 mg  6 mg Subcutaneous Once [name], MD      . sodium chloride flush (NS) 0.9 % injection 10 mL  10 mL Intracatheter PRN [name], MD        PHYSICAL EXAMINATION: ECOG PERFORMANCE STATUS: 1 - Symptomatic but completely ambulatory  Vitals:   09/20/17 1058  BP: (!) 149/70  Pulse: 95  Resp: 20  Temp: 98.5 F (36.9 C)  SpO2: 99%   Filed Weights   09/20/17 1058  Weight: 135 lb 8 oz (61.5 kg)    GENERAL:alert, no distress and comfortable SKIN: skin color, texture, turgor are normal, no rashes or significant  lesions EYES: normal, Conjunctiva are pink and non-injected, sclera clear OROPHARYNX:no exudate, no erythema and lips, buccal mucosa, and tongue normal  NECK: supple, thyroid normal size, non-tender, without nodularity LYMPH:  no palpable cervical, supraclavicular, axillary, or inguinal lymphadenopathy LUNGS: clear to auscultation bilaterally with normal breathing effort HEART: regular rate & rhythm and no murmurs and no lower extremity edema ABDOMEN:abdomen soft, non-tender and normal bowel sounds. No palpable hepatosplenomegaly  Musculoskeletal:no cyanosis of digits and no clubbing  NEURO: alert & oriented x 3 with fluent speech, no focal motor/sensory deficits BREASTS: right breast exam in normal. Left breast s/p mastectomy, surgical incision is closed, intact, healing well without erythema or drainage. (+) Prominent soft tissue to left chest wall inferior to surgical incision (+) 3x2 cm mass to upper outer quadrant of left breast, [place] confirmed fluid collection thought to be postoperative seroma/ resolving hematoma. No palpable lump or mass to left chest wall or  axilla.   LABORATORY DATA:  I have reviewed the data as listed CBC Latest Ref Rng & Units 09/20/2017 09/02/2017 08/26/2017  WBC 3.9 - 10.3 10e3/uL 3.9 28.8(H) 3.9  Hemoglobin 11.6 - 15.9 g/dL 13.3 13.0 13.1  Hematocrit 34.8 - 46.6 % 39.8 39.2 39.1  Platelets 145 - 400 10e3/uL 218 141(L) 245     CMP Latest Ref Rng & Units 09/20/2017 09/02/2017 08/26/2017  Glucose 70 - 140 mg/dl 191(H) 105 112  BUN 7.0 - 26.0 mg/dL 14.1 13.5 10.4  Creatinine 0.6 - 1.1 mg/dL 0.7 0.6 0.6  Sodium 136 - 145 mEq/L 138 133(L) 139  Potassium 3.5 - 5.1 mEq/L 3.9 4.0 3.7  Chloride 101 - 111 mmol/L - - -  CO2 22 - 29 mEq/L _0 Calcium 8.4 - 10.4 mg/dL 9.2 8.5 8.9  Total Protein 6.4 - 8.3 g/dL 7.3 5.9(L) 6.8  Total Bilirubin 0.20 - 1.20 mg/dL 0.40 0.25 0.41  Alkaline Phos 40 - 150 U/L 106 120 87  AST 5 - 34 U/L _1 ALT 0 - 55 U/L _2 RADIOGRAPHIC STUDIES: I have personally reviewed the radiological images as listed and agreed with the findings in the report. No results found.   ASSESSMENT & PLAN: Kathleen Hanson is a 81 y.o. caucasian female with a history of breast cancer, HLD, presented with screening discovered left breast cancer.  1. Malignant neoplasm of upper-outer quadrant of left breast in female, invasive ductal carcinoma, pT2N0M0, Stage: 1A, triple positive, Grade 3 2. History of invasive and in situ ductal carcinoma of left breast, 10/26/1999, Estrogen receptor negative, stage 1, Grade 3 3. Bone health 4. Bone pain, heartburn, increased BM, fatigue, secondary to chemotherapy 5. Left post-op seroma, axilla 6. Hyperglycemia   Ms. Kangas appears stable today. Flu-like body aches and bone pain resolved with tylenol after cycle 1. We reviewed claritin and tylenol dosing, she will take claritin once daily starting day of chemo x5-7 days and supplement with tylenol PRN. We reviewed decadron pre med; she took a dose today. Going forward she will take 2 tablets BID day prior to  chemo and day after x3 days. Will dose reduce IV decadron today. She did not require PRN anti-emetics. She continues to eat and drink well, weight is stable.  BP elevated, she attributes to white coat syndrome; not on BP medication. CBC unremarkable. Mild hyperglycemia on Cmet, BG 191, likely secondary to oral decadron. Labs adequate for treatment today, she will proceed with cycle 2 TCHP with Onpro. She will  return for f/u and cycle 3 in 3 weeks.   PLAN Begin claritin today, continue 5-7 days after chemo Decadron premed 2 tablets BID day prior to chemo and day after x3 days, reviewed  Labs reviewed, proceed with cycle 2 TCHP Return in 3 weeks for f/u and cycle 3   All questions were answered. The patient knows to call the clinic with any problems, questions or concerns. No barriers to learning was detected.     Alla Feeling, NP 09/20/17

## 2017-09-20 NOTE — Patient Instructions (Signed)
[facility] Discharge Instructions for Patients Receiving Chemotherapy  Today you received the following chemotherapy agents:  Herceptin, Taxotere, Carboplatin  To help prevent nausea and vomiting after your treatment, we encourage you to take your nausea medication as prescribed.   If you develop nausea and vomiting that is not controlled by your nausea medication, call the clinic.   BELOW ARE SYMPTOMS THAT SHOULD BE REPORTED IMMEDIATELY:  *FEVER GREATER THAN 100.5 F  *CHILLS WITH OR WITHOUT FEVER  NAUSEA AND VOMITING THAT IS NOT CONTROLLED WITH YOUR NAUSEA MEDICATION  *UNUSUAL SHORTNESS OF BREATH  *UNUSUAL BRUISING OR BLEEDING  TENDERNESS IN MOUTH AND THROAT WITH OR WITHOUT PRESENCE OF ULCERS  *URINARY PROBLEMS  *BOWEL PROBLEMS  UNUSUAL RASH Items with * indicate a potential emergency and should be followed up as soon as possible.  Feel free to call the clinic should you have any questions or concerns. The clinic phone number is [phone].  Please show the CHEMO ALERT CARD at check-in to the Emergency Department and triage nurse.   

## 2017-09-20 NOTE — Telephone Encounter (Signed)
Scheduled appt per 11/27 los - patient to get an updated schedule in the treatment area.

## 2017-09-23 ENCOUNTER — Other Ambulatory Visit: Payer: Self-pay | Admitting: Family Medicine

## 2017-09-23 DIAGNOSIS — E2839 Other primary ovarian failure: Secondary | ICD-10-CM

## 2017-10-07 ENCOUNTER — Other Ambulatory Visit: Payer: Medicare HMO

## 2017-10-07 ENCOUNTER — Ambulatory Visit: Payer: Medicare HMO

## 2017-10-07 ENCOUNTER — Ambulatory Visit: Payer: Medicare HMO | Admitting: Hematology

## 2017-10-11 ENCOUNTER — Ambulatory Visit
Admission: RE | Admit: 2017-10-11 | Discharge: 2017-10-11 | Disposition: A | Discharge status: Home / Self Care | Payer: Medicare HMO | Source: Ambulatory Visit | Attending: Family Medicine | Admitting: Family Medicine

## 2017-10-11 DIAGNOSIS — E2839 Other primary ovarian failure: Secondary | ICD-10-CM

## 2017-10-13 ENCOUNTER — Other Ambulatory Visit: Payer: Self-pay | Admitting: Hematology

## 2017-10-14 ENCOUNTER — Other Ambulatory Visit (HOSPITAL_BASED_OUTPATIENT_CLINIC_OR_DEPARTMENT_OTHER): Payer: Medicare HMO

## 2017-10-14 ENCOUNTER — Ambulatory Visit (HOSPITAL_BASED_OUTPATIENT_CLINIC_OR_DEPARTMENT_OTHER): Payer: Medicare HMO

## 2017-10-14 ENCOUNTER — Other Ambulatory Visit: Payer: Self-pay

## 2017-10-14 ENCOUNTER — Ambulatory Visit (HOSPITAL_BASED_OUTPATIENT_CLINIC_OR_DEPARTMENT_OTHER): Payer: Medicare HMO | Admitting: Nurse Practitioner

## 2017-10-14 ENCOUNTER — Telehealth: Payer: Self-pay | Admitting: Nurse Practitioner

## 2017-10-14 ENCOUNTER — Encounter: Payer: Self-pay | Admitting: Nurse Practitioner

## 2017-10-14 ENCOUNTER — Ambulatory Visit: Payer: Medicare HMO

## 2017-10-14 VITALS — BP 151/61 | HR 79 | Temp 98.4°F | Resp 18 | Ht 64.0 in | Wt 141.6 lb

## 2017-10-14 DIAGNOSIS — C50412 Malignant neoplasm of upper-outer quadrant of left female breast: Secondary | ICD-10-CM | POA: Diagnosis not present

## 2017-10-14 DIAGNOSIS — R739 Hyperglycemia, unspecified: Secondary | ICD-10-CM | POA: Diagnosis not present

## 2017-10-14 DIAGNOSIS — Z86000 Personal history of in-situ neoplasm of breast: Secondary | ICD-10-CM

## 2017-10-14 DIAGNOSIS — Z170 Estrogen receptor positive status [ER+]: Secondary | ICD-10-CM | POA: Diagnosis not present

## 2017-10-14 DIAGNOSIS — M898X9 Other specified disorders of bone, unspecified site: Secondary | ICD-10-CM | POA: Diagnosis not present

## 2017-10-14 DIAGNOSIS — Z5112 Encounter for antineoplastic immunotherapy: Secondary | ICD-10-CM | POA: Diagnosis not present

## 2017-10-14 DIAGNOSIS — R12 Heartburn: Secondary | ICD-10-CM

## 2017-10-14 DIAGNOSIS — R229 Localized swelling, mass and lump, unspecified: Secondary | ICD-10-CM

## 2017-10-14 DIAGNOSIS — L7634 Postprocedural seroma of skin and subcutaneous tissue following other procedure: Secondary | ICD-10-CM

## 2017-10-14 DIAGNOSIS — Z5111 Encounter for antineoplastic chemotherapy: Secondary | ICD-10-CM | POA: Diagnosis not present

## 2017-10-14 DIAGNOSIS — R5383 Other fatigue: Secondary | ICD-10-CM

## 2017-10-14 LAB — CBC WITH DIFFERENTIAL/PLATELET
BASO%: 0.7 % (ref 0.0–2.0)
Basophils Absolute: 0 10*3/uL (ref 0.0–0.1)
EOS%: 6.6 % (ref 0.0–7.0)
Eosinophils Absolute: 0.3 10*3/uL (ref 0.0–0.5)
HCT: 35.2 % (ref 34.8–46.6)
HEMOGLOBIN: 11.6 g/dL (ref 11.6–15.9)
LYMPH#: 0.8 10*3/uL — AB (ref 0.9–3.3)
LYMPH%: 17.4 % (ref 14.0–49.7)
MCH: 31 pg (ref 25.1–34.0)
MCHC: 33 g/dL (ref 31.5–36.0)
MCV: 94.1 fL (ref 79.5–101.0)
MONO#: 0.4 10*3/uL (ref 0.1–0.9)
MONO%: 8.4 % (ref 0.0–14.0)
NEUT#: 3 10*3/uL (ref 1.5–6.5)
NEUT%: 66.9 % (ref 38.4–76.8)
NRBC: 0 % (ref 0–0)
Platelets: 239 10*3/uL (ref 145–400)
RBC: 3.74 10*6/uL (ref 3.70–5.45)
RDW: 15.5 % — AB (ref 11.2–14.5)
WBC: 4.4 10*3/uL (ref 3.9–10.3)

## 2017-10-14 LAB — COMPREHENSIVE METABOLIC PANEL
ALBUMIN: 3.4 g/dL — AB (ref 3.5–5.0)
ALK PHOS: 90 U/L (ref 40–150)
ALT: 18 U/L (ref 0–55)
ANION GAP: 9 meq/L (ref 3–11)
AST: 19 U/L (ref 5–34)
BILIRUBIN TOTAL: 0.32 mg/dL (ref 0.20–1.20)
BUN: 11.6 mg/dL (ref 7.0–26.0)
CALCIUM: 8.5 mg/dL (ref 8.4–10.4)
CO2: 25 mEq/L (ref 22–29)
Chloride: 105 mEq/L (ref 98–109)
Creatinine: 0.5 mg/dL — ABNORMAL LOW (ref 0.6–1.1)
Glucose: 97 mg/dl (ref 70–140)
Potassium: 3.9 mEq/L (ref 3.5–5.1)
Sodium: 139 mEq/L (ref 136–145)
TOTAL PROTEIN: 6 g/dL — AB (ref 6.4–8.3)

## 2017-10-14 MED ORDER — PALONOSETRON HCL INJECTION 0.25 MG/5ML
INTRAVENOUS | Status: AC
  Filled 2017-10-14: qty 5

## 2017-10-14 MED ORDER — SODIUM CHLORIDE 0.9% FLUSH
10.0000 mL | Freq: Once | INTRAVENOUS | Status: AC
  Administered 2017-10-14: 10 mL
  Filled 2017-10-14: qty 10

## 2017-10-14 MED ORDER — ACETAMINOPHEN 325 MG PO TABS
ORAL_TABLET | ORAL | Status: AC
  Filled 2017-10-14: qty 2

## 2017-10-14 MED ORDER — ALTEPLASE 2 MG IJ SOLR
2.0000 mg | Freq: Once | INTRAMUSCULAR | Status: DC
  Filled 2017-10-14: qty 2

## 2017-10-14 MED ORDER — PEGFILGRASTIM 6 MG/0.6ML ~~LOC~~ PSKT
PREFILLED_SYRINGE | SUBCUTANEOUS | Status: AC
  Filled 2017-10-14: qty 0.6

## 2017-10-14 MED ORDER — SODIUM CHLORIDE 0.9 % IV SOLN
Freq: Once | INTRAVENOUS | Status: AC
  Administered 2017-10-14: 13:00:00 via INTRAVENOUS
  Filled 2017-10-14: qty 5

## 2017-10-14 MED ORDER — TRASTUZUMAB CHEMO 150 MG IV SOLR
6.0000 mg/kg | Freq: Once | INTRAVENOUS | Status: AC
  Administered 2017-10-14: 357 mg via INTRAVENOUS
  Filled 2017-10-14: qty 17

## 2017-10-14 MED ORDER — PALONOSETRON HCL INJECTION 0.25 MG/5ML
0.2500 mg | Freq: Once | INTRAVENOUS | Status: AC
  Administered 2017-10-14: 0.25 mg via INTRAVENOUS

## 2017-10-14 MED ORDER — SODIUM CHLORIDE 0.9 % IV SOLN
Freq: Once | INTRAVENOUS | Status: AC
  Administered 2017-10-14: 13:00:00 via INTRAVENOUS

## 2017-10-14 MED ORDER — DIPHENHYDRAMINE HCL 25 MG PO CAPS
50.0000 mg | ORAL_CAPSULE | Freq: Once | ORAL | Status: AC
  Administered 2017-10-14: 50 mg via ORAL

## 2017-10-14 MED ORDER — ACETAMINOPHEN 325 MG PO TABS
650.0000 mg | ORAL_TABLET | Freq: Once | ORAL | Status: AC
  Administered 2017-10-14: 650 mg via ORAL

## 2017-10-14 MED ORDER — DIPHENHYDRAMINE HCL 25 MG PO CAPS
ORAL_CAPSULE | ORAL | Status: AC
  Filled 2017-10-14: qty 2

## 2017-10-14 MED ORDER — CARBOPLATIN CHEMO INJECTION 450 MG/45ML
350.0000 mg | Freq: Once | INTRAVENOUS | Status: AC
  Administered 2017-10-14: 350 mg via INTRAVENOUS
  Filled 2017-10-14: qty 35

## 2017-10-14 MED ORDER — PEGFILGRASTIM 6 MG/0.6ML ~~LOC~~ PSKT
6.0000 mg | PREFILLED_SYRINGE | Freq: Once | SUBCUTANEOUS | Status: AC
  Administered 2017-10-14: 6 mg via SUBCUTANEOUS

## 2017-10-14 MED ORDER — DOCETAXEL CHEMO INJECTION 160 MG/16ML
75.0000 mg/m2 | Freq: Once | INTRAVENOUS | Status: AC
  Administered 2017-10-14: 120 mg via INTRAVENOUS
  Filled 2017-10-14: qty 12

## 2017-10-14 NOTE — Progress Notes (Signed)
[facility]  [phone] [phone]  Clinic Follow up Note   Patient Care Team: Beam, Russ Halo, MD as PCP - General (Family Medicine) [name], MD as Consulting Physician (General Surgery) [name], MD as Consulting Physician (Hematology) [name], MD as Consulting Physician (Radiation Oncology) 10/14/2017  CHIEF COMPLAINTS:  Follow up left breast cancer, pT2N0M0, triple positive   SUMMARY OF ONCOLOGIC HISTORY:   Malignant neoplasm of upper-outer quadrant of left breast in female, estrogen receptor positive ([place])   06/23/2017 Mammogram    Diagnostic mammogram 06/23/17 IMPRESSION: Indeterminate mass with irregular margins at the 2:30 position 4 cm from nipple measuring 1.4 x 0.8 x 1.2 cm in the upper-outer left breast.      06/28/2017 Initial Biopsy    Diagnosis 06/28/17 Breast, left, needle core biopsy, upper outer quadrant, 2:30 o'clock, 4cm from nipple - INVASIVE MAMMARY CARCINOMA WITH CALCIFICATIONS, SEE COMMENT.      06/28/2017 Receptors her2    Estrogen Receptor: 100%, POSITIVE, STRONG STAINING INTENSITY Progesterone Receptor: 20%, POSITIVE, STRONG STAINING INTENSITY Proliferation Marker Ki67: 25% HER2: Postive       06/30/2017 Initial Diagnosis    Malignant neoplasm of upper-outer quadrant of left breast in female, estrogen receptor positive ([place])      07/13/2017 Surgery    Left mastectomy with SLN biopsy performed by Dr Marlou Starks.       07/13/2017 Pathology Results    Diagnosis  1. Breast, simple mastectomy, Left - INVASIVE AND IN SITU DUCTAL CARCINOMA, 4 CM, MSBR GRADE 3. - MARGINS NOT INVOLVED. - SEE ONCOLOGY TABLE. 2. Lymph node, sentinel, biopsy, Left #1 - ONE BENIGN LYMPH NODE (0/1). 3. Lymph node, biopsy, Left #2 - ONE BENIGN LYMPH NODE (0/1).      07/18/2017 - 07/27/2017 Hospital Admission    Admit date: 07/18/17 Admission diagnosis: spontaneous PNX Additional comments: the patient was admitted with a  pneumothorax several days after a port was placed although her post procedure cxr was normal. She had a chest tube placed but had a persistent air leak. The pleurivac was placed to 40 cm h2o suction for several days and the pneumothorax finally resolved.       08/15/2017 Imaging    CT A/P IMPRESSION: 1. No CT findings to suggest metastatic disease involving the abdomen/pelvis. 2. Left chest wall fluid collection as discussed above. 3. Small right pleural effusion with minimal overlying atelectasis. 4. Cholelithiasis. 5. Small periumbilical abdominal wall hernia containing fat.       08/15/2017 Imaging    NM Whole Body Bone Scan FINDINGS: Uptake at the LEFT lateral aspects of the cervical and lower thoracic spine, typically degenerative.  Uptake at the shoulders, elbows, wrists, hips, and knees, typically degenerative.  Asymmetric uptake at the sternoclavicular joints RIGHT greater than LEFT which may also be degenerative.  No definite foci of abnormal osseous tracer accumulation are identified which are suspicious for osseous metastases.  Biconvex thoracolumbar scoliosis.  Expected urinary tract and soft tissue distribution of tracer.  IMPRESSION: Scattered likely degenerative type uptake as above.  No definite scintigraphic evidence of osseous metastatic disease.      08/16/2017 Genetic Testing    The patient had genetic testing due to a personal and family history of breast cancer.  The [employer] Multi-Cancer Panel was ordered. The Multi-Cancer Panel offered by [employer] includes sequencing and/or deletion duplication testing of the following 83 genes: ALK, APC, ATM, AXIN2,BAP1,  BARD1, BLM, BMPR1A, BRCA1, BRCA2, BRIP1, CASR, CDC73, CDH1, CDK4, CDKN1B, CDKN1C,  CDKN2A (p14ARF), CDKN2A (p16INK4a), CEBPA, CHEK2, CTNNA1, DICER1, DIS3L2, EGFR (c.2369C>T, p.Thr790Met variant only), EPCAM (Deletion/duplication testing only), FH, FLCN, GATA2, GPC3, GREM1 (Promoter region  deletion/duplication testing only), HOXB13 (c.251G>A, p.Gly84Glu), HRAS, KIT, MAX, MEN1, MET, MITF (c.952G>A, p.Glu318Lys variant only), MLH1, MSH2, MSH3, MSH6, MUTYH, NBN, NF1, NF2, NTHL1, PALB2, PDGFRA, PHOX2B, PMS2, POLD1, POLE, POT1, PRKAR1A, PTCH1, PTEN, RAD50, RAD51C, RAD51D, RB1, RECQL4, RET, RUNX1, SDHAF2, SDHA (sequence changes only), SDHB, SDHC, SDHD, SMAD4, SMARCA4, SMARCB1, SMARCE1, STK11, SUFU, TERC, TERT, TMEM127, TP53, TSC1, TSC2, VHL, WRN and WT1.   Results: Negative, no pathogenic variants identified in the genes analyzed.  The date of this test report is 08/16/2017.         08/26/2017 -  Chemotherapy    adjuvant TCHP with Onpro q3weeks for 6 cycles started on 08/26/17 followed herceptin maintenance therapy and anti-estrogen therapy      08/31/2017 Breast US    On physical exam,there is a firm smooth mass in the low left axilla spanning at least 5 cm.  Ultrasound is performed, showing a complicated fluid collection which is predominantly anechoic measuring 5.2 x 3.9 x 3.5 cm. No blood flow was identified within the soft tissue component of the mass on color Doppler imaging. The appearance is most consistent with a postoperative seroma/resolving hematoma.  IMPRESSION: The complex fluid collection in the left axilla is very likely to represent a postoperative seroma/ resolving hematoma.  RECOMMENDATION: Three-month follow-up left axillary ultrasound is recommended.      CURRENT THERAPY:  adjuvant TCHP with Onpro q3weeks for 6 cycles started on 08/26/17 followed herceptin maintenance therapy   INTERVAL HISTORY: Ms. Schriner returns for f/u as scheduled prior to cycle 3 adjuvant TCH. She has felt well since last cycle on 09/20/17. Mild fatigue but continues activities as normal. She is eating and drinking well. Denies fever or chills but feels head is "swimming" like she has a head cold. Clear rhinorrhea begins day 5 after each chemo, she has noticed mild blood in nasal  drainage lately. She takes antihistamine with neulasta. Denies sore throat, cough, dyspnea, chest pain, or upper or lower extremity edema. Uses laxative daily and has 3-4 stool per day, occasional diarrhea but she does not feel its related to chemotherapy. Denies nausea or vomiting, does not require anti-emetics. Denies bone or joint pain; has intermittent sharp left breast pain and still feels lump in left axilla. Her mood is good overall but it a little depressed about needing to undergo chemotherapy. She is otherwise doing well.    REVIEW OF SYSTEMS:   Constitutional: Denies fevers, chills or abnormal weight loss (+) good appetite (+) mild fatigue  Eyes: Denies blurriness of vision Ears, nose, mouth, throat, and face: Denies mucositis or sore throat (+) clear rhinorrhea with occasional blood in nasal drainage  Respiratory: Denies cough, dyspnea or wheezes Cardiovascular: Denies palpitation, chest discomfort or lower extremity swelling Gastrointestinal:  Denies nausea, vomiting, constipation, heartburn or change in bowel habits (+) 3-4 BM per day with daily laxative (+) occasional watery stool, not felt to be related to chemo Skin: Denies abnormal skin rashes Lymphatics: Denies new lymphadenopathy or easy bruising Neurological:Denies numbness, tingling or new weaknesses Behavioral/Psych: Mood is stable, no new changes (+) little depressed about going through chemo Breasts: (+) intermittent sharpe breast pain to surgical site All other systems were reviewed with the patient and are negative.  MEDICAL HISTORY:  Past Medical History:  Diagnosis Date  . Cancer of left breast ([place])   . Constipation   .  Family history of breast cancer   . Fibrocystic breast   . Hyperlipidemia   . Ocular migraine    "used to have them monthly for a period of time; seemed to stop; now have had a couple in the last week" (07/13/2017)    SURGICAL HISTORY: Past Surgical History:  Procedure Laterality Date  .  BREAST BIOPSY Right ~ 2001   BREAST EXCISIONAL BIOPSY  . BREAST LUMPECTOMY Left 2001   chemo and radiation  . CESAREAN SECTION  X 1  . IR CATHETER TUBE CHANGE  07/20/2017  . MASTECTOMY COMPLETE / SIMPLE W/ SENTINEL NODE BIOPSY Left 07/13/2017    LEFT MASTECTOMY WITH SENTINEL NODE MAPPING ERAS PATHWAY [name] 07/13/2017  . MASTECTOMY W/ SENTINEL NODE BIOPSY Left 07/13/2017   Procedure: LEFT MASTECTOMY WITH SENTINEL NODE MAPPING ERAS PATHWAY;  Surgeon: [name], MD;  Location: [facility];  Service: General;  Laterality: Left;  . PORTACATH PLACEMENT Right 07/13/2017  . PORTACATH PLACEMENT Right 07/13/2017   Procedure: INSERTION PORT-A-CATH;  Surgeon: [name], MD;  Location: [facility];  Service: General;  Laterality: Right;  . TONSILLECTOMY      I have reviewed the social history and family history with the patient and they are unchanged from previous note.  ALLERGIES:  has No Known Allergies.  MEDICATIONS:  Current Outpatient Medications  Medication Sig Dispense Refill  . dexamethasone (DECADRON) 4 MG tablet Take 2 tablets (8 mg total) by mouth 2 (two) times daily. Start the day before Taxotere. Then again the day after chemo for 3 days. 30 tablet 1  . lidocaine-prilocaine (EMLA) cream Apply to affected area once 30 g 3  . Multiple Vitamin (MULTIVITAMIN WITH MINERALS) TABS tablet Take 1 tablet by mouth daily.    . polyethylene glycol powder (GLYCOLAX/MIRALAX) powder MIX 17 GRAMS IN LIQUID AND DRINK DAILY IN THE EVENING    . simvastatin (ZOCOR) 40 MG tablet Take 40 mg daily by mouth.    . vitamin C (ASCORBIC ACID) 500 MG tablet Take 500 mg by mouth daily.    Marland Kitchen aspirin 81 MG tablet Chew 81 mg by mouth daily.     . cholecalciferol (VITAMIN D) 1000 units tablet Take 1,000 Units by mouth daily.    Marland Kitchen HYDROcodone-acetaminophen (NORCO/VICODIN) 5-325 MG tablet Take 1-2 tablets by mouth every 4 (four) hours as needed for moderate pain. (Patient not taking: Reported on 09/20/2017) 15 tablet 0  .  ondansetron (ZOFRAN) 8 MG tablet Take 1 tablet (8 mg total) by mouth 2 (two) times daily as needed for refractory nausea / vomiting. Start on day 3 after chemo. (Patient not taking: Reported on 09/20/2017) 30 tablet 1  . prochlorperazine (COMPAZINE) 10 MG tablet Take 1 tablet (10 mg total) by mouth every 6 (six) hours as needed (Nausea or vomiting). (Patient not taking: Reported on 09/20/2017) 30 tablet 1   No current facility-administered medications for this visit.    Facility-Administered Medications Ordered in Other Visits  Medication Dose Route Frequency Provider Last Rate Last Dose  . CARBOplatin (PARAPLATIN) 350 mg in sodium chloride 0.9 % 250 mL chemo infusion  350 mg Intravenous Once [name], MD      . DOCEtaxel (TAXOTERE) 120 mg in sodium chloride 0.9 % 250 mL chemo infusion  75 mg/m2 (Treatment Plan Recorded) Intravenous Once [name], MD      . pegfilgrastim (NEULASTA ONPRO KIT) injection 6 mg  6 mg Subcutaneous Once [name], MD        PHYSICAL EXAMINATION:  ECOG PERFORMANCE STATUS: 1 - Symptomatic but completely ambulatory  Vitals:   10/14/17 1132  BP: (!) 151/61  Pulse: 79  Resp: 18  Temp: 98.4 F (36.9 C)  SpO2: 98%   Filed Weights   10/14/17 1132  Weight: 141 lb 9.6 oz (64.2 kg)    GENERAL:alert, no distress and comfortable SKIN: skin color, texture, turgor are normal, no rashes or significant lesions EYES: normal, conjunctiva are pink and non-injected, sclera clear OROPHARYNX:no exudate, no erythema and lips, buccal mucosa, and tongue normal  NECK: supple, thyroid normal size, non-tender, without nodularity LYMPH:  no palpable cervical, supraclavicular, or inguinal lymphadenopathy LUNGS: clear to auscultation bilaterally with normal breathing effort HEART: regular rate & rhythm and no murmurs and no lower extremity edema ABDOMEN:abdomen soft, non-tender and normal bowel sounds. No palpable hepatosplenomegaly  Musculoskeletal:no cyanosis of digits and no  clubbing  NEURO: alert & oriented x 3 with fluent speech, no focal motor/sensory deficits BREASTS: right breast exam in normal. (+) Left breast s/p mastectomy, surgical incision is closed, intact, healing well without erythema or drainage. (+) previously prominent soft tissue to left chest wall inferior to surgical incision has resolved  (+) 4x3 cm mass to upper outer quadrant of left breast / low axilla, [place] confirmed fluid collection spanning 5 cm thought to be postoperative seroma/ resolving hematoma. No palpable lump or mass to left chest wall. PAC without erythema  LABORATORY DATA:  I have reviewed the data as listed CBC Latest Ref Rng & Units 10/14/2017 09/20/2017 09/02/2017  WBC 3.9 - 10.3 10e3/uL 4.4 3.9 28.8(H)  Hemoglobin 11.6 - 15.9 g/dL 11.6 13.3 13.0  Hematocrit 34.8 - 46.6 % 35.2 39.8 39.2  Platelets 145 - 400 10e3/uL 239 218 141(L)     CMP Latest Ref Rng & Units 10/14/2017 09/20/2017 09/02/2017  Glucose 70 - 140 mg/dl 97 191(H) 105  BUN 7.0 - 26.0 mg/dL 11.6 14.1 13.5  Creatinine 0.6 - 1.1 mg/dL 0.5(L) 0.7 0.6  Sodium 136 - 145 mEq/L 139 138 133(L)  Potassium 3.5 - 5.1 mEq/L 3.9 3.9 4.0  Chloride 101 - 111 mmol/L - - -  CO2 22 - 29 mEq/L '25 23 27  ' Calcium 8.4 - 10.4 mg/dL 8.5 9.2 8.5  Total Protein 6.4 - 8.3 g/dL 6.0(L) 7.3 5.9(L)  Total Bilirubin 0.20 - 1.20 mg/dL 0.32 0.40 0.25  Alkaline Phos 40 - 150 U/L 90 106 120  AST 5 - 34 U/L '19 21 24  ' ALT 0 - 55 U/L '18 29 20    ' RADIOGRAPHIC STUDIES: I have personally reviewed the radiological images as listed and agreed with the findings in the report. No results found.   ASSESSMENT & PLAN: JALIE EILAND is a 82 y.o. caucasian female with a history of breast cancer, HLD, presented with screening discovered left breast cancer.  1. Malignant neoplasm of upper-outer quadrant of left breast in female, invasive ductal carcinoma, pT2N0M0, Stage: 1A, triple positive, Grade 3 2. History of invasive and in situ ductal carcinoma  of left breast, 10/26/1999, Estrogen receptor negative, stage 1, Grade 3 3. Bone health 4. Bone pain, heartburn, increased BM, fatigue, secondary to chemotherapy 5. Left post-op seroma, axilla 6. Hyperglycemia  7. Left low axilla mass, complex fluid collection very likely to be post-op seroma/resolving hematoma per [place]  Ms. Enterline is stable today, appears to be tolerating chemotherapy well overall. She has completed 2 cycles adjuvant TCH. I recommend she use a cool mist humidifier for rhinorrhea and occasional blood in nasal drainage.  Plt count is normal. She is on antihistamine with neulasta injection. I suggest she hold laxative for frequent BM or if it becomes loose/watery. Labs reviewed, CBC and Cmet unremarkable and adequate for treatment. Physical exam notable for left low axilla round mass thought to represent post-op seroma/resolving hematoma on [place]. She will have repeat US in 11/2017. She will continue adjuvant TCH with neulasta today and q3 weeks. Next echo 11/09/17. Return in 3 weeks for f/u and cycle 4.   PLAN Labs reviewed, proceed with cycle 3 adjuvant TCH today and continue q3 week Return for f/u and cycle 4 TCH in 3 weeks  F/u left axilla mass with [place] in 11/2017  All questions were answered. The patient knows to call the clinic with any problems, questions or concerns. No barriers to learning was detected.     Alla Feeling, NP 10/14/17

## 2017-10-14 NOTE — Telephone Encounter (Signed)
Scheduled appt per 12/21 los - Patient to get an updated schedule in the treatment area.

## 2017-10-14 NOTE — Patient Instructions (Signed)
[facility] Discharge Instructions for Patients Receiving Chemotherapy  Today you received the following chemotherapy agents:  Herceptin (trastuzumab), Taxol (paclitaxel), Carboplatin (paraplatin)  To help prevent nausea and vomiting after your treatment, we encourage you to take your nausea medication as prescribed.   If you develop nausea and vomiting that is not controlled by your nausea medication, call the clinic.   BELOW ARE SYMPTOMS THAT SHOULD BE REPORTED IMMEDIATELY:  *FEVER GREATER THAN 100.5 F  *CHILLS WITH OR WITHOUT FEVER  NAUSEA AND VOMITING THAT IS NOT CONTROLLED WITH YOUR NAUSEA MEDICATION  *UNUSUAL SHORTNESS OF BREATH  *UNUSUAL BRUISING OR BLEEDING  TENDERNESS IN MOUTH AND THROAT WITH OR WITHOUT PRESENCE OF ULCERS  *URINARY PROBLEMS  *BOWEL PROBLEMS  UNUSUAL RASH Items with * indicate a potential emergency and should be followed up as soon as possible.  Feel free to call the clinic should you have any questions or concerns. The clinic phone number is [phone].  Please show the [place] at check-in to the Emergency Department and triage nurse.

## 2017-10-21 ENCOUNTER — Telehealth: Payer: Self-pay | Admitting: *Deleted

## 2017-10-21 NOTE — Telephone Encounter (Signed)
Received call from Kathleen Hanson discussing treatment regimen & how rigorous it is & that her quality of life is impacted.  She had concerns over taking claritin vs zyrtec.  Kathleen Hanson has been on zyrtec.  Informed OK to cont zyrtec for drippy nose.  Someone may have told her to take claritin for bone pain with the On-Pro.  She states that she has had some incontinence of urine & stool & doesn't like this.  Instructed to keep records & let Dr Burr Medico know how she is doing & discuss at next visit.  Kathleen Hanson expressed appreciation for listening & suggestions.

## 2017-11-03 NOTE — Progress Notes (Signed)
[name]  [phone] [phone]  Clinic Follow Up Note   Patient Care Team: Beam, Russ Halo, MD as PCP - General (Family Medicine) [name], MD as Consulting Physician (General Surgery) [name], MD as Consulting Physician (Hematology) [name], MD as Consulting Physician (Radiation Oncology) 11/04/2017   CHIEF COMPLAINTS:  Follow up left breast cancer, pT2N0M0, triple positive   Oncology History   Cancer Staging Malignant neoplasm of upper-outer quadrant of left breast in female, estrogen receptor positive ([place]) Staging form: Breast, AJCC 8th Edition - Clinical stage from 07/06/2017: Stage IA (cT1c, cN0, cM0, G3, ER: Positive, PR: Positive, HER2: Positive) - Unsigned Staging comments: Staged at breast conference 9.12.18 - Pathologic stage from 07/13/2017: Stage IA (pT2, pN0, cM0, G3, ER: Positive, PR: Positive, HER2: Positive) - Signed by [name], MD on 08/16/2017       Malignant neoplasm of upper-outer quadrant of left breast in female, estrogen receptor positive ([place]leen Hanson)   06/23/2017 Mammogram    Diagnostic mammogram 06/23/17 IMPRESSION: Indeterminate mass with irregular margins at Kathleen 2:30 position 4 cm from nipple measuring 1.4 x 0.8 x 1.2 cm in Kathleen upper-outer left breast.      06/28/2017 Initial Biopsy    Diagnosis 06/28/17 Breast, left, needle core biopsy, upper outer quadrant, 2:30 o'clock, 4cm from nipple - INVASIVE MAMMARY CARCINOMA WITH CALCIFICATIONS, SEE COMMENT.      06/28/2017 Receptors her2    Estrogen Receptor: 100%, POSITIVE, STRONG STAINING INTENSITY Progesterone Receptor: 20%, POSITIVE, STRONG STAINING INTENSITY Proliferation Marker Ki67: 25% HER2: Postive       06/30/2017 Initial Diagnosis    Malignant neoplasm of upper-outer quadrant of left breast in female, estrogen receptor positive ([place]athleen Hanson)      07/13/2017 Surgery    Left mastectomy with SLN biopsy performed by Dr Marlou Starks.       07/13/2017 Pathology  Results    Diagnosis  1. Breast, simple mastectomy, Left - INVASIVE AND IN SITU DUCTAL CARCINOMA, 4 CM, MSBR GRADE 3. - MARGINS NOT INVOLVED. - SEE ONCOLOGY TABLE. 2. Lymph node, sentinel, biopsy, Left #1 - ONE BENIGN LYMPH NODE (0/1). 3. Lymph node, biopsy, Left #2 - ONE BENIGN LYMPH NODE (0/1).      07/18/2017 - 07/27/2017 Hospital Admission    Admit date: 07/18/17 Admission diagnosis: spontaneous PNX Additional comments: Kathleen patient was admitted with a pneumothorax several days after a port was placed although her post procedure cxr was normal. She had a chest tube placed but had a persistent air leak. Kathleen pleurivac was placed to 40 cm h2o suction for several days and Kathleen pneumothorax finally resolved.       08/15/2017 Imaging    CT A/P IMPRESSION: 1. No CT findings to suggest metastatic disease involving Kathleen abdomen/pelvis. 2. Left chest wall fluid collection as discussed above. 3. Small right pleural effusion with minimal overlying atelectasis. 4. Cholelithiasis. 5. Small periumbilical abdominal wall hernia containing fat.       08/15/2017 Imaging    NM Whole Body Bone Scan FINDINGS: Uptake at Kathleen LEFT lateral aspects of Kathleen cervical and lower thoracic spine, typically degenerative.  Uptake at Kathleen shoulders, elbows, wrists, hips, and knees, typically degenerative.  Asymmetric uptake at Kathleen sternoclavicular joints RIGHT greater than LEFT which may also be degenerative.  No definite foci of abnormal osseous tracer accumulation are identified which are suspicious for osseous metastases.  Biconvex thoracolumbar scoliosis.  Expected urinary tract and soft tissue distribution of tracer.  IMPRESSION: Scattered likely degenerative type uptake  as above.  No definite scintigraphic evidence of osseous metastatic disease.      08/16/2017 Genetic Testing    Kathleen patient had genetic testing due to a personal and family history of breast cancer.  Kathleen [facility]  Multi-Cancer Panel was ordered. Kathleen Multi-Cancer Panel offered by [facility] includes sequencing and/or deletion duplication testing of Kathleen following 83 genes: ALK, APC, ATM, AXIN2,BAP1,  BARD1, BLM, BMPR1A, BRCA1, BRCA2, BRIP1, CASR, CDC73, CDH1, CDK4, CDKN1B, CDKN1C, CDKN2A (p14ARF), CDKN2A (p16INK4a), CEBPA, CHEK2, CTNNA1, DICER1, DIS3L2, EGFR (c.2369C>T, p.Thr790Met variant only), EPCAM (Deletion/duplication testing only), FH, FLCN, GATA2, GPC3, GREM1 (Promoter region deletion/duplication testing only), HOXB13 (c.251G>A, p.Gly84Glu), HRAS, KIT, MAX, MEN1, MET, MITF (c.952G>A, p.Glu318Lys variant only), MLH1, MSH2, MSH3, MSH6, MUTYH, NBN, NF1, NF2, NTHL1, PALB2, PDGFRA, PHOX2B, PMS2, POLD1, POLE, POT1, PRKAR1A, PTCH1, PTEN, RAD50, RAD51C, RAD51D, RB1, RECQL4, RET, RUNX1, SDHAF2, SDHA (sequence changes only), SDHB, SDHC, SDHD, SMAD4, SMARCA4, SMARCB1, SMARCE1, STK11, SUFU, TERC, TERT, TMEM127, TP53, TSC1, TSC2, VHL, WRN and WT1.   Results: Negative, no pathogenic variants identified in Kathleen genes analyzed.  Kathleen date of this test report is 08/16/2017.         08/26/2017 -  Chemotherapy    adjuvant TCHP with Onpro q3weeks for 6 cycles started on 08/26/17 followed herceptin maintenance therapy and anti-estrogen therapy      08/31/2017 Breast US    On physical exam,there is a firm smooth mass in Kathleen low left axilla spanning at least 5 cm.  Ultrasound is performed, showing a complicated fluid collection which is predominantly anechoic measuring 5.2 x 3.9 x 3.5 cm. No blood flow was identified within Kathleen soft tissue component of Kathleen mass on color Doppler imaging. Kathleen appearance is most consistent with a postoperative seroma/resolving hematoma.  IMPRESSION: Kathleen complex fluid collection in Kathleen left axilla is very likely to represent a postoperative seroma/ resolving hematoma.  RECOMMENDATION: Three-month follow-up left axillary ultrasound is recommended.        HISTORY OF PRESENTING ILLNESS:  07/06/17 Kathleen Hanson 82 y.o. female is here because of newly diagnosed Malignant neoplasm of upper-outer quadrant of left breast in female, estrogen receptor positive. She presents to Breast Clinic alone today.   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 Her sister had breast cancer when she was 78. Her father had bladder cancer due to smoking. She denies any other significant medical history. She smoked from [age] yo, less than a pack.   Today she reports to feeling her lump after her mammogram and she does not have any pain.  She denies change in weight, appetite or pain. She has been active with weight watchers and has purposefully lost weight.  She did not ask husband to attend but he would if she needed him to. She has two children, daughter and adopted son. She is retired, but previously a Pharmacist, hospital. 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 She wears hearing aids.    GYN HISTORY  Menarchal: [age] LMP: early 10s in 1992 Contraceptive: No HRT: No GP: G2P1A1, prior miscarriage, Has daughter and adopted son   CURRENT THERAPY:  adjuvant TCHP with Onpro q3weeks for 6 cycles started on 08/26/17 followed herceptin maintenance therapy    INTERVAL HISTORY:   CARMA DWIGGINS presents today after cycle 3 of TCH. She presents to Kathleen clinic today noting she is not doing well. Pt reports that during each cycle she starts  to have cold symptoms of fatigue, dry cough, clogged ears, and congestion. She reports symptoms of foggy brain and some added depression from her fatigue. She notes that she has started an antibiotic 6 days ago, prescribed by her PCP at [facility]. Her cough symptoms have become better since being on Kathleen antibiotic. Pt has some dry skin on her hands but she forgot to take Decadron today but will take it tomorrow.   Pt is concerned also because she has had a lot of dental work done in Kathleen  past 2 years. She needs a crown and to fix a prior crown.    Of note since Kathleen patient last visit, she has had DEXA completed on 10/11/17 with results revealing a T score of -0.9.   On review of systems, pt denies fever, skin itching or any other complaints. Pertinent positives are listed and detailed within Kathleen above HPI.    MEDICAL HISTORY:  Past Medical History:  Diagnosis Date  . Cancer of left breast ([place])   . Constipation   . Family history of breast cancer   . Fibrocystic breast   . Hyperlipidemia   . Ocular migraine    "used to have them monthly for a period of time; seemed to stop; now have had a couple in Kathleen last week" (07/13/2017)    SURGICAL HISTORY: Past Surgical History:  Procedure Laterality Date  . BREAST BIOPSY Right ~ 2001   BREAST EXCISIONAL BIOPSY  . BREAST LUMPECTOMY Left 2001   chemo and radiation  . CESAREAN SECTION  X 1  . IR CATHETER TUBE CHANGE  07/20/2017  . MASTECTOMY COMPLETE / SIMPLE W/ SENTINEL NODE BIOPSY Left 07/13/2017    LEFT MASTECTOMY WITH SENTINEL NODE MAPPING ERAS PATHWAY [name] 07/13/2017  . MASTECTOMY W/ SENTINEL NODE BIOPSY Left 07/13/2017   Procedure: LEFT MASTECTOMY WITH SENTINEL NODE MAPPING ERAS PATHWAY;  Surgeon: [name], MD;  Location: [facility];  Service: General;  Laterality: Left;  . PORTACATH PLACEMENT Right 07/13/2017  . PORTACATH PLACEMENT Right 07/13/2017   Procedure: INSERTION PORT-A-CATH;  Surgeon: [name], MD;  Location: [facility];  Service: General;  Laterality: Right;  . TONSILLECTOMY      SOCIAL HISTORY: Social History   Socioeconomic History  . Marital status: Married    Spouse name: Not on file  . Number of children: Not on file  . Years of education: Not on file  . Highest education level: Not on file  Social Needs  . Financial resource strain: Not on file  . Food insecurity - worry: Not on file  . Food insecurity - inability: Not on file  . Transportation needs - medical: Not on file  .  Transportation needs - non-medical: Not on file  Occupational History  . Not on file  Tobacco Use  . Smoking status: Former Smoker    Packs/day: 0.50    Years: 15.00    Pack years: 7.50    Types: Cigarettes    Last attempt to quit: 1976    Years since quitting: 43.0  . Smokeless tobacco: Never Used  Substance and Sexual Activity  . Alcohol use: Yes    Alcohol/week: 3.0 oz    Types: 5 Glasses of wine per week  . Drug use: No  . Sexual activity: Not Currently  Other Topics Concern  . Not on file  Social History Narrative  . Not on file    FAMILY HISTORY: Family History  Problem Relation Age of Onset  .  Cancer Father        bladder cancer  . Breast cancer Sister 73    ALLERGIES:  has No Known Allergies.  MEDICATIONS:  Current Outpatient Medications  Medication Sig Dispense Refill  . lidocaine-prilocaine (EMLA) cream Apply to affected area once 30 g 3  . Multiple Vitamin (MULTIVITAMIN WITH MINERALS) TABS tablet Take 1 tablet by mouth daily.    . polyethylene glycol powder (GLYCOLAX/MIRALAX) powder MIX 17 GRAMS IN LIQUID AND DRINK DAILY IN Kathleen EVENING    . simvastatin (ZOCOR) 40 MG tablet Take 40 mg daily by mouth.    . vitamin C (ASCORBIC ACID) 500 MG tablet Take 500 mg by mouth daily.    Marland Kitchen aspirin 81 MG tablet Chew 81 mg by mouth daily.     Marland Kitchen dexamethasone (DECADRON) 4 MG tablet Take 2 tablets (8 mg total) by mouth 2 (two) times daily. Start Kathleen day before Taxotere. Then again Kathleen day after chemo for 3 days. (Patient not taking: Reported on 11/04/2017) 30 tablet 1  . ondansetron (ZOFRAN) 8 MG tablet Take 1 tablet (8 mg total) by mouth 2 (two) times daily as needed for refractory nausea / vomiting. Start on day 3 after chemo. (Patient not taking: Reported on 09/20/2017) 30 tablet 1  . prochlorperazine (COMPAZINE) 10 MG tablet Take 1 tablet (10 mg total) by mouth every 6 (six) hours as needed (Nausea or vomiting). (Patient not taking: Reported on 09/20/2017) 30 tablet 1   No  current facility-administered medications for this visit.    Facility-Administered Medications Ordered in Other Visits  Medication Dose Route Frequency Provider Last Rate Last Dose  . CARBOplatin (PARAPLATIN) 340 mg in sodium chloride 0.9 % 250 mL chemo infusion  340 mg Intravenous Once [name], MD      . DOCEtaxel (TAXOTERE) 120 mg in sodium chloride 0.9 % 250 mL chemo infusion  75 mg/m2 (Treatment Plan Recorded) Intravenous Once [name], MD 262 mL/hr at 11/04/17 1420 120 mg at 11/04/17 1420  . heparin lock flush 100 unit/mL  500 Units Intracatheter Once PRN [name], MD      . pegfilgrastim (NEULASTA ONPRO KIT) injection 6 mg  6 mg Subcutaneous Once [name], MD      . sodium chloride flush (NS) 0.9 % injection 10 mL  10 mL Intracatheter PRN [name], MD        REVIEW OF SYSTEMS:   Constitutional: Denies fevers, chills or abnormal night sweats (+) lower appetite  Eyes: Denies blurriness of vision, double vision or watery eyes Ears, nose, mouth, throat, and face: Denies mucositis or sore throat (+) wears hearing aids Respiratory: Denies cough, dyspnea or wheezes Cardiovascular: Denies palpitation, chest discomfort or lower extremity swelling Gastrointestinal:  Denies nausea or change in bowel habits (+) heartburn, increased stool output  Skin: Denies abnormal skin rashes Lymphatics: Denies new lymphadenopathy or easy bruising Neurological:Denies numbness, tingling or new weaknesses MSK: (+) bone pain in hip and lower back Behavioral/Psych: Mood is stable, no new changes  All other systems were reviewed with Kathleen patient and are negative.  PHYSICAL EXAMINATION: ECOG PERFORMANCE STATUS:1  Vitals:   11/04/17 1109  BP: (!) 154/73  Pulse: 83  Resp: 18  Temp: 99.1 F (37.3 C)  SpO2: 100%   Filed Weights   11/04/17 1109  Weight: 138 lb 9.6 oz (62.9 kg)    GENERAL:alert, no distress and comfortable SKIN: skin color, texture, turgor are normal, no rashes or significant  lesions EYES: normal, conjunctiva are pink  and non-injected, sclera clear OROPHARYNX:no exudate, no erythema and lips, buccal mucosa, and tongue normal  NECK: supple, thyroid normal size, non-tender, without nodularity LYMPH:  no palpable lymphadenopathy in Kathleen cervical, axillary or inguinal LUNGS: clear to auscultation and percussion with normal breathing effort HEART: regular rate & rhythm and no murmurs and no lower extremity edema ABDOMEN:abdomen soft, non-tender and normal bowel sounds Musculoskeletal:no cyanosis of digits and no clubbing  PSYCH: alert & oriented x 3 with fluent speech NEURO: no focal motor/sensory deficits BREAST: exam deferred today    LABORATORY DATA:  I have reviewed Kathleen data as listed CBC Latest Ref Rng & Units 11/04/2017 10/14/2017 09/20/2017  WBC 3.9 - 10.3 K/uL 3.5(L) 4.4 3.9  Hemoglobin 11.6 - 15.9 g/dL 11.3(L) 11.6 13.3  Hematocrit 34.8 - 46.6 % 33.3(L) 35.2 39.8  Platelets 145 - 400 K/uL 289 239 218    CMP Latest Ref Rng & Units 11/04/2017 10/14/2017 09/20/2017  Glucose 70 - 140 mg/dL 95 97 191(H)  BUN 7 - 26 mg/dL 8 11.6 14.1  Creatinine 0.60 - 1.10 mg/dL 0.55(L) 0.5(L) 0.7  Sodium 136 - 145 mmol/L 138 139 138  Potassium 3.3 - 4.7 mmol/L 4.1 3.9 3.9  Chloride 98 - 109 mmol/L 103 - -  CO2 22 - 29 mmol/L '26 25 23  ' Calcium 8.4 - 10.4 mg/dL 8.8 8.5 9.2  Total Protein 6.4 - 8.3 g/dL 6.1(L) 6.0(L) 7.3  Total Bilirubin 0.2 - 1.2 mg/dL 0.3 0.32 0.40  Alkaline Phos 40 - 150 U/L 92 90 106  AST 5 - 34 U/L '16 19 21  ' ALT 0 - 55 U/L '16 18 29   ' PATHOLOGY  Diagnosis 07/13/17 1. Breast, simple mastectomy, Left - INVASIVE AND IN SITU DUCTAL CARCINOMA, 4 CM, MSBR GRADE 3. - MARGINS NOT INVOLVED. - SEE ONCOLOGY TABLE. 2. Lymph node, sentinel, biopsy, Left #1 - ONE BENIGN LYMPH NODE (0/1). 3. Lymph node, biopsy, Left #2 - ONE BENIGN LYMPH NODE (0/1). Microscopic Comment 1. BREAST, INVASIVE TUMOR Procedure: Mastectomy and two sentinel lymph  nodes Laterality: Left breast Tumor Size: 4.0 cm Histologic Type: Ductal Grade: 3 Tubular Differentiation: 3 Nuclear Pleomorphism: 2 Mitotic Count: 2 Ductal Carcinoma in Situ (DCIS): Present, intermediate grade Extent of Tumor: Skin: Free of tumor Nipple: Free of tumor Skeletal muscle: Free of tumor Margins: Free of tumor Invasive carcinoma, distance from closest margin: 2 cm from posterior margin DCIS, distance from closest margin: 2 cm from posterior margin Regional Lymph Nodes: Number of Lymph Nodes Examined: 2 Number of Sentinel Lymph Nodes Examined: 2 Lymph Nodes with Macrometastases: 0 Lymph Nodes with Micrometastases: 0 1 of 3 FINAL for RENATA, GAMBINO ([id number]) Microscopic Comment(continued) Lymph Nodes with Isolated Tumor Cells: 0 Breast Prognostic Profile: Case number [id number] Estrogen Receptor: 100%, positive, strong staining Progesterone Receptor: 20%, positive, strong staining Her2: Equivocal by FISH, ratio 1.49 and average copy number 4.2. Positive by immunohistochemistry (3+) Ki-67: 25% Best tumor block for sendout testing: 1C Pathologic Stage Classification (pTNM, AJCC 8th Edition): Primary Tumor (pT): pT2 Regional Lymph Nodes (pN): pN0 Distant Metastases (pM): pMX  Diagnosis 06/28/17 Breast, left, needle core biopsy, upper outer quadrant, 2:30 o'clock, 4cm from nipple - INVASIVE MAMMARY CARCINOMA WITH CALCIFICATIONS, SEE COMMENT. Microscopic Comment Kathleen carcinoma appears grade 3. E-cadherin will be ordered and reported in an addendum. Prognostic markers will be ordered. Dr. Melina Copa has reviewed Kathleen case. Kathleen case was called to [place] on 06/29/2017. FLUORESCENCE IN-SITU HYBRIDIZATION Results: HER2 - *EQUIVOCAL* (Her2 by IHC  ordered) RATIO OF HER2/CEP17 SIGNALS 1.49 AVERAGE HER2 COPY NUMBER PER CELL 4.20 Reference Range: NEGATIVE HER2/CEP17 Ratio <2.0 and average HER2 copy number <4.0 EQUIVOCAL HER2/CEP17 Ratio <2.0 and  average HER2 copy number >=4.0 and <6.0 POSITIVE HER2/CEP17 Ratio >=2.0 or <2.0 and average HER2 copy number >=6.0 PROGNOSTIC INDICATORS Results: IMMUNOHISTOCHEMICAL AND MORPHOMETRIC ANALYSIS PERFORMED MANUALLY Estrogen Receptor: 100%, POSITIVE, STRONG STAINING INTENSITY Progesterone Receptor: 20%, POSITIVE, STRONG STAINING INTENSITY Proliferation Marker Ki67: 25% 1 of 3 FINAL for CADY, HAFEN [phone]) ADDITIONAL INFORMATION:(continued) REFERENCE RANGE ESTROGEN RECEPTOR NEGATIVE 0% POSITIVE =>1% REFERENCE RANGE PROGESTERONE RECEPTOR NEGATIVE 0% POSITIVE =>1% All controls stained appropriately    RADIOGRAPHIC STUDIES: I have personally reviewed Kathleen radiological images as listed and agreed with Kathleen findings in Kathleen report.  DG Bone Density 10/11/17 ASSESSMENT: Kathleen BMD measured at AP Spine L1-L3 is 1.072 g/cm2 with a T-score of -0.9. This patient is considered normal according to [facility]) criteria. L-4 was excluded due to degenerative changes. There has been no statistically significant change in BMD of Left hip since prior exam dated 05/06/2014. Spine was not compared to prior study due to exclusion of vertebral bodies on current exam.  Site Region Measured Date Measured Age YA BMD Significant CHANGE T-score AP Spine  L1-L3     10[age]11/2017    78.9         -0.9    1.072 g/cm2  DualFemur Neck Left 10/11/2017    78.9         -0.9    0.910 g/cm2  NM Whole Body Scan 08/15/17 FINDINGS: Uptake at Kathleen LEFT lateral aspects of Kathleen cervical and lower thoracic spine, typically degenerative.  Uptake at Kathleen shoulders, elbows, wrists, hips, and knees, typically degenerative.  Asymmetric uptake at Kathleen sternoclavicular joints RIGHT greater than LEFT which may also be degenerative.  No definite foci of abnormal osseous tracer accumulation are identified which are suspicious for osseous metastases.  Biconvex thoracolumbar scoliosis.  Expected urinary  tract and soft tissue distribution of tracer.  IMPRESSION: Scattered likely degenerative type uptake as above. No definite scintigraphic evidence of osseous metastatic disease.  CT A/P10/22/18 IMPRESSION: 1. No CT findings to suggest metastatic disease involving Kathleen abdomen/pelvis. 2. Left chest wall fluid collection as discussed above. 3. Small right pleural effusion with minimal overlying atelectasis. 4. Cholelithiasis. 5. Small periumbilical abdominal wall hernia containing fat.  Dg Bone Density (dxa)  Result Date: 10/11/2017 EXAM: DUAL X-RAY ABSORPTIOMETRY (DXA) FOR BONE MINERAL DENSITY IMPRESSION: Referring Physician:  Russ Halo BEAM PATIENT: Name: Malinda, Mayden Patient ID: [id number] Birth Date: 20-Sep-1936 Height: 64.0 in. Sex: Female Measured: 10/11/2017 Weight: 140.4 lbs. Indications: Advanced Age, Breast Cancer History, Caucasian, Estrogen Deficient, Postmenopausal Fractures: None Treatments: Multivitamin ASSESSMENT: Kathleen BMD measured at AP Spine L1-L3 is 1.072 g/cm2 with a T-score of -0.9. This patient is considered normal according to [facility]) criteria. L-4 was excluded due to degenerative changes. There has been no statistically significant change in BMD of Left hip since prior exam dated 05/06/2014. Spine was not compared to prior study due to exclusion of vertebral bodies on current exam. Site Region Measured Date Measured Age YA BMD Significant CHANGE T-score AP Spine  L1-L3     10/11/2017    78.9         -0.9    1.072 g/cm2 DualFemur Neck Left 10/11/2017    78.9         -0.9    0.910 g/cm2 World Health Organization [facility]) criteria  for post-menopausal, Caucasian Women: Normal       T-score at or above -1 SD Osteopenia   T-score between -1 and -2.5 SD Osteoporosis T-score at or below -2.5 SD RECOMMENDATION: [place] recommends that FDA-approved medical therapies be considered in postmenopausal women and men age 30 or older with a: 1.  Hip or vertebral (clinical or morphometric) fracture. 2. T-score of <-2.5 at Kathleen spine or hip. 3. Ten-year fracture probability by FRAX of 3% or greater for hip fracture or 20% or greater for major osteoporotic fracture. All treatment decisions require clinical judgment and consideration of individual patient factors, including patient preferences, co-morbidities, previous drug use, risk factors not captured in Kathleen FRAX model (e.g. falls, vitamin D deficiency, increased bone turnover, interval significant decline in bone density) and possible under - or over-estimation of fracture risk by FRAX. All patients should ensure an adequate intake of dietary calcium (1200 mg/d) and vitamin D (800 IU daily) unless contraindicated. FOLLOW-UP: People with diagnosed cases of osteoporosis or at high risk for fracture should have regular bone mineral density tests. For patients eligible for Medicare, routine testing is allowed once every 2 years. Kathleen testing frequency can be increased to one year for patients who have rapidly progressing disease, those who are receiving or discontinuing medical therapy to restore bone mass, or have additional risk factors. I have reviewed this report, and agree with Kathleen above findings. [facility] Radiology Electronically Signed   By: Marijo Conception, M.D.   On: 10/11/2017 12:25    ASSESSMENT & PLAN:  Kathleen Hanson is a 82 y.o. caucasian female with a history of breast cancer, HLD, presented with screening discovered left breast cancer.  1. Malignant neoplasm of upper-outer quadrant of left breast in female, invasive ductal carcinoma, pT2N0M0, Stage: 1A, triple positive, Grade 3 -She underwent mastectomy on 07/13/17, I previously reviewed her surgical pathology results with patient in details. -Her primary tumor was 4 cm, grade 3, triple positive, surgical margins were negative, 2 sentinel lymph nodes were negative. -We discussed her invasive ductal tumor is early stage, but Kathleen  Biology is more aggressive, due to HER2 positive, grade 3. She has moderate to high risk of recurrence after surgical resection. -She started adjuvant TCH with onpro on 08/26/17, with moderate side effects and overall not feeling well.  -I addressed her concern for quality of life while on chemotherapy. I explained if not tolerable we can reduce her dose or only do 4-5 treatments instead of 6.  -I previously suggested peer support group to have someone to talk to about her treatment.  -Lab reviewed, adequate for treatment, she is overall doing moderately well, pt will proceed with cycle 4 today. She is slightly anemic today (11/04/17) at Hgb of 11.3.  - I advised her to keep taking Kathleen Decadron as prescribed.  -We discussed that when her chemo is finished we will continue with just Herceptin for a total of 1 year.   2. 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 Osteopenia - 03/18/2011, DEXA T = -1.9 -03/27/2014, DEXA  T = -1.4  -I previously encouraged her to take calcium and vitamin D supplement -10/11/2017, DEXA T -0.9  4. Bone pain, heartburn, increased BM, fatigue, secondary to chemo treatment -she experienced bone pain from onpro. I suggest she take Claritin once daily for 5 days  after chemo and no more than 342m of tylenol daily.  -I suggest she uses OTC Prilosec or Pepcid for heartburn, if not enough I can call in a prescription  -For her BM she can hold on her laxative tonight and if her BM continue to increase she can take imodium -Encouraged her to consider nutritional supplement, and to be physically active during Kathleen chemo  PLAN: Lab reviewed, adequate for treatment, [facility] She will return in 3 weeks for f/u and cycle 5.      No orders of Kathleen defined types were placed in  this encounter.   All questions were answered. Kathleen patient knows to call Kathleen clinic with any problems, questions or concerns. I spent 20 minutes counseling Kathleen patient face to face. Kathleen total time spent in Kathleen appointment was 25 minutes and more than 50% was on counseling.  This document serves as a record of services personally performed by YTruitt Merle MD. It was created on her behalf by DTheresia Bough a trained medical scribe. Kathleen creation of this record is based on Kathleen scribe's personal observations and Kathleen provider's statements to them.   I have reviewed Kathleen above documentation for accuracy and completeness, and I agree with Kathleen above.     YTruitt Merle MD 11/04/2017

## 2017-11-04 ENCOUNTER — Encounter: Payer: Self-pay | Admitting: Hematology

## 2017-11-04 ENCOUNTER — Inpatient Hospital Stay: Payer: Medicare HMO | Attending: Hematology

## 2017-11-04 ENCOUNTER — Inpatient Hospital Stay: Payer: Medicare HMO

## 2017-11-04 ENCOUNTER — Inpatient Hospital Stay (HOSPITAL_BASED_OUTPATIENT_CLINIC_OR_DEPARTMENT_OTHER): Payer: Medicare HMO | Admitting: Hematology

## 2017-11-04 VITALS — BP 154/73 | HR 83 | Temp 99.1°F | Resp 18 | Ht 64.0 in | Wt 138.6 lb

## 2017-11-04 DIAGNOSIS — Z170 Estrogen receptor positive status [ER+]: Secondary | ICD-10-CM | POA: Diagnosis not present

## 2017-11-04 DIAGNOSIS — C50412 Malignant neoplasm of upper-outer quadrant of left female breast: Secondary | ICD-10-CM

## 2017-11-04 DIAGNOSIS — Z5112 Encounter for antineoplastic immunotherapy: Secondary | ICD-10-CM | POA: Diagnosis present

## 2017-11-04 DIAGNOSIS — Z87891 Personal history of nicotine dependence: Secondary | ICD-10-CM

## 2017-11-04 DIAGNOSIS — Z86000 Personal history of in-situ neoplasm of breast: Secondary | ICD-10-CM

## 2017-11-04 DIAGNOSIS — Z803 Family history of malignant neoplasm of breast: Secondary | ICD-10-CM

## 2017-11-04 DIAGNOSIS — Z5111 Encounter for antineoplastic chemotherapy: Secondary | ICD-10-CM | POA: Diagnosis present

## 2017-11-04 LAB — COMPREHENSIVE METABOLIC PANEL
ALK PHOS: 92 U/L (ref 40–150)
ALT: 16 U/L (ref 0–55)
ANION GAP: 9 (ref 3–11)
AST: 16 U/L (ref 5–34)
Albumin: 3.3 g/dL — ABNORMAL LOW (ref 3.5–5.0)
BUN: 8 mg/dL (ref 7–26)
CALCIUM: 8.8 mg/dL (ref 8.4–10.4)
CO2: 26 mmol/L (ref 22–29)
CREATININE: 0.55 mg/dL — AB (ref 0.60–1.10)
Chloride: 103 mmol/L (ref 98–109)
Glucose, Bld: 95 mg/dL (ref 70–140)
Potassium: 4.1 mmol/L (ref 3.3–4.7)
Sodium: 138 mmol/L (ref 136–145)
Total Bilirubin: 0.3 mg/dL (ref 0.2–1.2)
Total Protein: 6.1 g/dL — ABNORMAL LOW (ref 6.4–8.3)

## 2017-11-04 LAB — CBC WITH DIFFERENTIAL/PLATELET
Basophils Absolute: 0 10*3/uL (ref 0.0–0.1)
Basophils Relative: 1 %
EOS PCT: 5 %
Eosinophils Absolute: 0.2 10*3/uL (ref 0.0–0.5)
HCT: 33.3 % — ABNORMAL LOW (ref 34.8–46.6)
HEMOGLOBIN: 11.3 g/dL — AB (ref 11.6–15.9)
LYMPHS PCT: 16 %
Lymphs Abs: 0.5 10*3/uL — ABNORMAL LOW (ref 0.9–3.3)
MCH: 31.3 pg (ref 25.1–34.0)
MCHC: 33.8 g/dL (ref 31.5–36.0)
MCV: 92.6 fL (ref 79.5–101.0)
MONOS PCT: 10 %
Monocytes Absolute: 0.3 10*3/uL (ref 0.1–0.9)
NEUTROS PCT: 68 %
Neutro Abs: 2.4 10*3/uL (ref 1.5–6.5)
Platelets: 289 10*3/uL (ref 145–400)
RBC: 3.59 MIL/uL — AB (ref 3.70–5.45)
RDW: 16.5 % — ABNORMAL HIGH (ref 11.2–16.1)
WBC: 3.5 10*3/uL — AB (ref 3.9–10.3)

## 2017-11-04 MED ORDER — SODIUM CHLORIDE 0.9 % IV SOLN
Freq: Once | INTRAVENOUS | Status: AC
  Administered 2017-11-04: 13:00:00 via INTRAVENOUS

## 2017-11-04 MED ORDER — ACETAMINOPHEN 325 MG PO TABS
ORAL_TABLET | ORAL | Status: AC
  Filled 2017-11-04: qty 2

## 2017-11-04 MED ORDER — SODIUM CHLORIDE 0.9 % IV SOLN
75.0000 mg/m2 | Freq: Once | INTRAVENOUS | Status: AC
  Administered 2017-11-04: 120 mg via INTRAVENOUS
  Filled 2017-11-04: qty 12

## 2017-11-04 MED ORDER — PEGFILGRASTIM 6 MG/0.6ML ~~LOC~~ PSKT
6.0000 mg | PREFILLED_SYRINGE | Freq: Once | SUBCUTANEOUS | Status: AC
  Administered 2017-11-04: 6 mg via SUBCUTANEOUS

## 2017-11-04 MED ORDER — TRASTUZUMAB CHEMO 150 MG IV SOLR
6.0000 mg/kg | Freq: Once | INTRAVENOUS | Status: AC
  Administered 2017-11-04: 357 mg via INTRAVENOUS
  Filled 2017-11-04: qty 17

## 2017-11-04 MED ORDER — SODIUM CHLORIDE 0.9% FLUSH
10.0000 mL | INTRAVENOUS | Status: DC | PRN
  Administered 2017-11-04: 10 mL
  Filled 2017-11-04: qty 10

## 2017-11-04 MED ORDER — PALONOSETRON HCL INJECTION 0.25 MG/5ML
INTRAVENOUS | Status: AC
  Filled 2017-11-04: qty 5

## 2017-11-04 MED ORDER — HEPARIN SOD (PORK) LOCK FLUSH 100 UNIT/ML IV SOLN
500.0000 [IU] | Freq: Once | INTRAVENOUS | Status: AC | PRN
  Administered 2017-11-04: 500 [IU]
  Filled 2017-11-04: qty 5

## 2017-11-04 MED ORDER — DIPHENHYDRAMINE HCL 25 MG PO CAPS
ORAL_CAPSULE | ORAL | Status: AC
  Filled 2017-11-04: qty 2

## 2017-11-04 MED ORDER — SODIUM CHLORIDE 0.9% FLUSH
10.0000 mL | Freq: Once | INTRAVENOUS | Status: AC
  Administered 2017-11-04: 10 mL
  Filled 2017-11-04: qty 10

## 2017-11-04 MED ORDER — ACETAMINOPHEN 325 MG PO TABS
650.0000 mg | ORAL_TABLET | Freq: Once | ORAL | Status: AC
  Administered 2017-11-04: 650 mg via ORAL

## 2017-11-04 MED ORDER — PEGFILGRASTIM 6 MG/0.6ML ~~LOC~~ PSKT
PREFILLED_SYRINGE | SUBCUTANEOUS | Status: AC
Start: 2017-11-04 — End: ?
  Filled 2017-11-04: qty 0.6

## 2017-11-04 MED ORDER — PALONOSETRON HCL INJECTION 0.25 MG/5ML
0.2500 mg | Freq: Once | INTRAVENOUS | Status: AC
Start: 2017-11-04 — End: 2017-11-04
  Administered 2017-11-04: 0.25 mg via INTRAVENOUS

## 2017-11-04 MED ORDER — SODIUM CHLORIDE 0.9 % IV SOLN
Freq: Once | INTRAVENOUS | Status: AC
  Administered 2017-11-04: 13:00:00 via INTRAVENOUS
  Filled 2017-11-04: qty 5

## 2017-11-04 MED ORDER — SODIUM CHLORIDE 0.9 % IV SOLN
344.5000 mg | Freq: Once | INTRAVENOUS | Status: AC
  Administered 2017-11-04: 340 mg via INTRAVENOUS
  Filled 2017-11-04: qty 34

## 2017-11-04 MED ORDER — DIPHENHYDRAMINE HCL 25 MG PO CAPS
50.0000 mg | ORAL_CAPSULE | Freq: Once | ORAL | Status: AC
  Administered 2017-11-04: 50 mg via ORAL

## 2017-11-04 NOTE — Patient Instructions (Signed)
[facility] Discharge Instructions for Patients Receiving Chemotherapy  Today you received the following chemotherapy agents:  Herceptin, Taxotere, and Carboplatin.  To help prevent nausea and vomiting after your treatment, we encourage you to take your nausea medication as directed.   If you develop nausea and vomiting that is not controlled by your nausea medication, call the clinic.   BELOW ARE SYMPTOMS THAT SHOULD BE REPORTED IMMEDIATELY:  *FEVER GREATER THAN 100.5 F  *CHILLS WITH OR WITHOUT FEVER  NAUSEA AND VOMITING THAT IS NOT CONTROLLED WITH YOUR NAUSEA MEDICATION  *UNUSUAL SHORTNESS OF BREATH  *UNUSUAL BRUISING OR BLEEDING  TENDERNESS IN MOUTH AND THROAT WITH OR WITHOUT PRESENCE OF ULCERS  *URINARY PROBLEMS  *BOWEL PROBLEMS  UNUSUAL RASH Items with * indicate a potential emergency and should be followed up as soon as possible.  Feel free to call the clinic should you have any questions or concerns. The clinic phone number is [phone].  Please show the CHEMO ALERT CARD at check-in to the Emergency Department and triage nurse.   

## 2017-11-09 ENCOUNTER — Ambulatory Visit (HOSPITAL_COMMUNITY)
Admission: RE | Admit: 2017-11-09 | Discharge: 2017-11-09 | Disposition: A | Discharge status: Home / Self Care | Payer: Medicare HMO | Source: Ambulatory Visit | Attending: Internal Medicine | Admitting: Internal Medicine

## 2017-11-09 ENCOUNTER — Encounter (HOSPITAL_COMMUNITY): Payer: Self-pay | Admitting: Internal Medicine

## 2017-11-09 ENCOUNTER — Ambulatory Visit (HOSPITAL_BASED_OUTPATIENT_CLINIC_OR_DEPARTMENT_OTHER)
Admission: RE | Admit: 2017-11-09 | Discharge: 2017-11-09 | Disposition: A | Discharge status: Home / Self Care | Payer: Medicare HMO | Source: Ambulatory Visit | Attending: Internal Medicine | Admitting: Internal Medicine

## 2017-11-09 ENCOUNTER — Ambulatory Visit (HOSPITAL_COMMUNITY): Payer: Medicare HMO | Admitting: Internal Medicine

## 2017-11-09 VITALS — BP 156/62 | HR 74 | Wt 143.8 lb

## 2017-11-09 DIAGNOSIS — C50412 Malignant neoplasm of upper-outer quadrant of left female breast: Secondary | ICD-10-CM

## 2017-11-09 DIAGNOSIS — Z170 Estrogen receptor positive status [ER+]: Secondary | ICD-10-CM | POA: Insufficient documentation

## 2017-11-09 DIAGNOSIS — I071 Rheumatic tricuspid insufficiency: Secondary | ICD-10-CM | POA: Insufficient documentation

## 2017-11-09 NOTE — Progress Notes (Signed)
CARDIO-ONCOLOGY CLINIC CONSULT NOTE  Referring Physician: Burr Medico   HPI:  Ms. Gergely is 82 y.o. female with triple + left breast cancer referred by Dr. Burr Medico for enrollment into the Cardio-Oncology program.  She has h/o HL but no known heart disease. 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 Treated with adriamycin and XRT.   Diagnosed with left breast CA in 9/18. Has undergone lest mastectomy.   Usually very active but now more fatigues with chemo. No edema, orthopnea or PND. No CP   Current adjuvant TCHP with Onpro q3weeks for 6 cycles started on 08/26/17. Now s/p 4/6  followed herceptin maintenance therapy   Echo EF 60-65% Grade I DD. LS ' 12.0 cm/s GLS inaccurate Personally reviewed       Oncology History   Cancer Staging Malignant neoplasm of upper-outer quadrant of left breast in female, estrogen receptor positive ([place]) Staging form: Breast, AJCC 8th Edition - Clinical stage from 07/06/2017: Stage IA (cT1c, cN0, cM0, G3, ER: Positive, PR: Positive, HER2: Positive) - Unsigned Staging comments: Staged at breast conference 9.12.18 - Pathologic stage from 07/13/2017: Stage IA (pT2, pN0, cM0, G3, ER: Positive, PR: Positive, HER2: Positive) - Signed by [name], MD on 08/16/2017       Malignant neoplasm of upper-outer quadrant of left breast in female, estrogen receptor positive ([place])   06/23/2017 Mammogram    Diagnostic mammogram 06/23/17 IMPRESSION: Indeterminate mass with irregular margins at the 2:30 position 4 cm from nipple measuring 1.4 x 0.8 x 1.2 cm in the upper-outer left breast.      06/28/2017 Initial Biopsy    Diagnosis 06/28/17 Breast, left, needle core biopsy, upper outer quadrant, 2:30 o'clock, 4cm from nipple - INVASIVE MAMMARY CARCINOMA WITH CALCIFICATIONS, SEE COMMENT.      06/28/2017 Receptors her2    Estrogen Receptor: 100%, POSITIVE, STRONG STAINING INTENSITY Progesterone Receptor: 20%,  POSITIVE, STRONG STAINING INTENSITY Proliferation Marker Ki67: 25% HER2: Postive       06/30/2017 Initial Diagnosis    Malignant neoplasm of upper-outer quadrant of left breast in female, estrogen receptor positive ([place])      07/13/2017 Surgery    Left mastectomy with SLN biopsy performed by Dr Marlou Starks.       07/13/2017 Pathology Results    Diagnosis  1. Breast, simple mastectomy, Left - INVASIVE AND IN SITU DUCTAL CARCINOMA, 4 CM, MSBR GRADE 3. - MARGINS NOT INVOLVED. - SEE ONCOLOGY TABLE. 2. Lymph node, sentinel, biopsy, Left #1 - ONE BENIGN LYMPH NODE (0/1). 3. Lymph node, biopsy, Left #2 - ONE BENIGN LYMPH NODE (0/1).    08/26/2017 -  Chemotherapy    adjuvant TCHP with Onpro q3weeks for 6 cycles started on 08/26/17 followed herceptin maintenance therapy and anti-estrogen therapy       Review of Systems: [y] = yes, '[ ]'  = no   General: Weight gain '[ ]' ; Weight loss '[ ]' ; Anorexia '[ ]' ; Fatigue [ y]; Fever '[ ]' ; Chills '[ ]' ; Weakness '[ ]'   Cardiac: Chest pain/pressure '[ ]' ; Resting SOB '[ ]' ; Exertional SOB '[ ]' ; Orthopnea '[ ]' ; Pedal Edema '[ ]' ; Palpitations '[ ]' ; Syncope '[ ]' ; Presyncope '[ ]' ; Paroxysmal nocturnal dyspnea'[ ]'   Pulmonary: Cough '[ ]' ; Wheezing'[ ]' ; Hemoptysis'[ ]' ; Sputum '[ ]' ; Snoring '[ ]'   GI: Vomiting'[ ]' ; Dysphagia'[ ]' ; Melena'[ ]' ; Hematochezia '[ ]' ; Heartburn'[ ]' ; Abdominal pain '[ ]' ; Constipation '[ ]' ; Diarrhea '[ ]' ; BRBPR '[ ]'   GU: Hematuria'[ ]' ; Dysuria '[ ]' ;  Nocturia'[ ]'   Vascular: Pain in legs with walking '[ ]' ; Pain in feet with lying flat '[ ]' ; Non-healing sores '[ ]' ; Stroke '[ ]' ; TIA '[ ]' ; Slurred speech '[ ]' ;  Neuro: Headaches[ y]; Vertigo'[ ]' ; Seizures'[ ]' ; Paresthesias'[ ]' ;Blurred vision '[ ]' ; Diplopia '[ ]' ; Vision changes '[ ]'   Ortho/Skin: Arthritis Blue.Reese ]; Joint pain Blue.Reese ]; Muscle pain '[ ]' ; Joint swelling '[ ]' ; Back Pain '[ ]' ; Rash '[ ]'   Psych: Depression'[ ]' ; Anxiety'[ ]'   Heme: Bleeding problems '[ ]' ; Clotting disorders '[ ]' ; Anemia '[ ]'   Endocrine: Diabetes '[ ]' ; Thyroid  dysfunction'[ ]'    Past Medical History:  Diagnosis Date  . Cancer of left breast ([place])   . Constipation   . Family history of breast cancer   . Fibrocystic breast   . Hyperlipidemia   . Ocular migraine    "used to have them monthly for a period of time; seemed to stop; now have had a couple in the last week" (07/13/2017)    Current Outpatient Medications  Medication Sig Dispense Refill  . aspirin 81 MG tablet Chew 81 mg by mouth daily.     Marland Kitchen dexamethasone (DECADRON) 4 MG tablet Take 2 tablets (8 mg total) by mouth 2 (two) times daily. Start the day before Taxotere. Then again the day after chemo for 3 days. (Patient not taking: Reported on 11/04/2017) 30 tablet 1  . lidocaine-prilocaine (EMLA) cream Apply to affected area once 30 g 3  . Multiple Vitamin (MULTIVITAMIN WITH MINERALS) TABS tablet Take 1 tablet by mouth daily.    . ondansetron (ZOFRAN) 8 MG tablet Take 1 tablet (8 mg total) by mouth 2 (two) times daily as needed for refractory nausea / vomiting. Start on day 3 after chemo. (Patient not taking: Reported on 09/20/2017) 30 tablet 1  . polyethylene glycol powder (GLYCOLAX/MIRALAX) powder MIX 17 GRAMS IN LIQUID AND DRINK DAILY IN THE EVENING    . prochlorperazine (COMPAZINE) 10 MG tablet Take 1 tablet (10 mg total) by mouth every 6 (six) hours as needed (Nausea or vomiting). (Patient not taking: Reported on 09/20/2017) 30 tablet 1  . simvastatin (ZOCOR) 40 MG tablet Take 40 mg daily by mouth.    . vitamin C (ASCORBIC ACID) 500 MG tablet Take 500 mg by mouth daily.     No current facility-administered medications for this encounter.     No Known Allergies    Social History   Socioeconomic History  . Marital status: Married    Spouse name: Not on file  . Number of children: Not on file  . Years of education: Not on file  . Highest education level: Not on file  Social Needs  . Financial resource strain: Not on file  . Food insecurity - worry: Not on file  . Food  insecurity - inability: Not on file  . Transportation needs - medical: Not on file  . Transportation needs - non-medical: Not on file  Occupational History  . Not on file  Tobacco Use  . Smoking status: Former Smoker    Packs/day: 0.50    Years: 15.00    Pack years: 7.50    Types: Cigarettes    Last attempt to quit: 1976    Years since quitting: 43.0  . Smokeless tobacco: Never Used  Substance and Sexual Activity  . Alcohol use: Yes    Alcohol/week: 3.0 oz    Types: 5 Glasses of wine per week  . Drug use: No  . Sexual activity:  Not Currently  Other Topics Concern  . Not on file  Social History Narrative  . Not on file      Family History  Problem Relation Age of Onset  . Cancer Father        bladder cancer  . Breast cancer Sister 23    Vitals:   11/09/17 1104  BP: (!) 156/62  Pulse: 74  SpO2: 100%  Weight: 143 lb 12.8 oz (65.2 kg)    PHYSICAL EXAM: General:  Well appearing. No respiratory difficulty HEENT: normal Neck: supple. no JVD. Carotids 2+ bilat; no bruits. No lymphadenopathy or thryomegaly appreciated. Cor: PMI nondisplaced. Regular rate & rhythm. No rubs, gallops or murmurs. R port-a-cath Lungs: clear Abdomen: soft, nontender, nondistended. No hepatosplenomegaly. No bruits or masses. Good bowel sounds. Extremities: no cyanosis, clubbing, rash, edema Neuro: alert & oriented x 3, cranial nerves grossly intact. moves all 4 extremities w/o difficulty. Affect pleasant.    ASSESSMENT & PLAN: 1. Malignant neoplasm of upper-outer quadrant of left breast in female, invasive ductal carcinoma, pT2N0M0, Stage: 1A, triple positive, Grade 3 -She underwent mastectomy on 07/13/17 -She started adjuvant TCH with onpro on 08/26/17, with moderate side effects and overall not feeling well.  - Explained incidence of Herceptin cardiotoxicity and role of Cardio-oncology clinic at length. Echo images reviewed personally. All parameters stable. Reviewed signs and symptoms of  HF to look for. Continue Herceptin. Follow-up with echo in 3 months.  2. HTN - Says BPs are usually normal will follow with home cuff.   Glori Bickers, MD  11:02 AM

## 2017-11-09 NOTE — Patient Instructions (Signed)
Your physician recommends that you schedule a follow-up appointment in: 3 months with echocardiogram  

## 2017-11-09 NOTE — Addendum Note (Signed)
Encounter addended by: Scarlette Calico, RN on: 11/09/2017 11:45 AM  Actions taken: Order list changed, Diagnosis association updated, Sign clinical note

## 2017-11-09 NOTE — Progress Notes (Signed)
Echocardiogram 2D Echocardiogram has been performed.  Matilde Bash 11/09/2017, 11:11 AM

## 2017-11-10 ENCOUNTER — Other Ambulatory Visit: Payer: Self-pay | Admitting: *Deleted

## 2017-11-10 ENCOUNTER — Encounter: Payer: Self-pay | Admitting: *Deleted

## 2017-11-14 ENCOUNTER — Telehealth: Payer: Self-pay | Admitting: Medical Oncology

## 2017-11-14 NOTE — Telephone Encounter (Addendum)
Pt called back because she forgot to tell me she has bowel and bladder "incontinence". In the past 2 days she had  1 episode of each .-  She is wearing an extra long perineal pad for incontinence. She describes her urinary incontinence as dampness on her perineal pad. She describes stool as mushy and last night she could not hold it . She denies dysuria , frequency, positive  hx of urinary incontinence. She has been taking miralax daily . I told her to hold  miralax tomorrow.

## 2017-11-14 NOTE — Telephone Encounter (Signed)
I called her back and answered all her questions. She does not feel she needs to be seen for now. Due to her significant side effects from last cycle chemo, we decided to stop her chemo (she already had 4 cycles), and continue herceptin maintenance.   [name] MD

## 2017-11-14 NOTE — Telephone Encounter (Signed)
Hands -Red,stinging on top of hands burning x several days. She is applying ice to her hands. She is nauseated but is eating ,taking zofran. She reports her QOL is low.  Questions- confused about length of chemo and herceptin txs. Asking for statistics for her type of cancer /prognosis. She is 46 and wants a better QOL. She request a call from Dr Burr Medico.

## 2017-11-23 ENCOUNTER — Other Ambulatory Visit: Payer: Self-pay | Admitting: Hematology

## 2017-11-25 ENCOUNTER — Inpatient Hospital Stay: Payer: Medicare HMO

## 2017-11-25 ENCOUNTER — Inpatient Hospital Stay: Payer: Medicare HMO | Attending: Hematology | Admitting: Hematology

## 2017-11-25 ENCOUNTER — Telehealth: Payer: Self-pay | Admitting: Hematology

## 2017-11-25 ENCOUNTER — Other Ambulatory Visit: Payer: Self-pay | Admitting: *Deleted

## 2017-11-25 ENCOUNTER — Ambulatory Visit (HOSPITAL_COMMUNITY)
Admission: RE | Admit: 2017-11-25 | Discharge: 2017-11-25 | Disposition: A | Discharge status: Home / Self Care | Payer: Medicare HMO | Source: Ambulatory Visit | Attending: Hematology | Admitting: Hematology

## 2017-11-25 VITALS — BP 138/59 | HR 82 | Temp 98.2°F | Resp 20 | Ht 64.0 in | Wt 143.8 lb

## 2017-11-25 DIAGNOSIS — Z9012 Acquired absence of left breast and nipple: Secondary | ICD-10-CM | POA: Insufficient documentation

## 2017-11-25 DIAGNOSIS — Z170 Estrogen receptor positive status [ER+]: Principal | ICD-10-CM

## 2017-11-25 DIAGNOSIS — Z5112 Encounter for antineoplastic immunotherapy: Secondary | ICD-10-CM | POA: Insufficient documentation

## 2017-11-25 DIAGNOSIS — C50412 Malignant neoplasm of upper-outer quadrant of left female breast: Secondary | ICD-10-CM

## 2017-11-25 DIAGNOSIS — M7989 Other specified soft tissue disorders: Secondary | ICD-10-CM | POA: Insufficient documentation

## 2017-11-25 DIAGNOSIS — M8580 Other specified disorders of bone density and structure, unspecified site: Secondary | ICD-10-CM | POA: Diagnosis not present

## 2017-11-25 DIAGNOSIS — L03116 Cellulitis of left lower limb: Secondary | ICD-10-CM | POA: Insufficient documentation

## 2017-11-25 DIAGNOSIS — Z803 Family history of malignant neoplasm of breast: Secondary | ICD-10-CM | POA: Insufficient documentation

## 2017-11-25 DIAGNOSIS — Z87891 Personal history of nicotine dependence: Secondary | ICD-10-CM | POA: Diagnosis not present

## 2017-11-25 LAB — CBC WITH DIFFERENTIAL/PLATELET
Basophils Absolute: 0 10*3/uL (ref 0.0–0.1)
Basophils Relative: 1 %
Eosinophils Absolute: 0.2 10*3/uL (ref 0.0–0.5)
Eosinophils Relative: 6 %
HEMATOCRIT: 32.5 % — AB (ref 34.8–46.6)
HEMOGLOBIN: 10.8 g/dL — AB (ref 11.6–15.9)
LYMPHS ABS: 0.5 10*3/uL — AB (ref 0.9–3.3)
LYMPHS PCT: 15 %
MCH: 31.6 pg (ref 25.1–34.0)
MCHC: 33.4 g/dL (ref 31.5–36.0)
MCV: 94.9 fL (ref 79.5–101.0)
MONOS PCT: 12 %
Monocytes Absolute: 0.4 10*3/uL (ref 0.1–0.9)
NEUTROS PCT: 66 %
Neutro Abs: 2.3 10*3/uL (ref 1.5–6.5)
Platelets: 247 10*3/uL (ref 145–400)
RBC: 3.42 MIL/uL — ABNORMAL LOW (ref 3.70–5.45)
RDW: 17.3 % — ABNORMAL HIGH (ref 11.2–14.5)
WBC: 3.5 10*3/uL — AB (ref 3.9–10.3)

## 2017-11-25 LAB — COMPREHENSIVE METABOLIC PANEL
ALT: 13 U/L (ref 0–55)
AST: 15 U/L (ref 5–34)
Albumin: 3.1 g/dL — ABNORMAL LOW (ref 3.5–5.0)
Alkaline Phosphatase: 85 U/L (ref 40–150)
Anion gap: 7 (ref 3–11)
BUN: 12 mg/dL (ref 7–26)
CHLORIDE: 105 mmol/L (ref 98–109)
CO2: 27 mmol/L (ref 22–29)
CREATININE: 0.51 mg/dL — AB (ref 0.60–1.10)
Calcium: 8.4 mg/dL (ref 8.4–10.4)
GFR calc Af Amer: >60 mL/min (ref >60)
Glucose, Bld: 81 mg/dL (ref 70–140)
POTASSIUM: 4.5 mmol/L (ref 3.5–5.1)
SODIUM: 139 mmol/L (ref 136–145)
Total Bilirubin: 0.3 mg/dL (ref 0.2–1.2)
Total Protein: 5.7 g/dL — ABNORMAL LOW (ref 6.4–8.3)

## 2017-11-25 MED ORDER — HEPARIN SOD (PORK) LOCK FLUSH 100 UNIT/ML IV SOLN
500.0000 [IU] | Freq: Once | INTRAVENOUS | Status: AC
  Administered 2017-11-25: 500 [IU]
  Filled 2017-11-25: qty 5

## 2017-11-25 MED ORDER — ALTEPLASE 2 MG IJ SOLR
2.0000 mg | Freq: Once | INTRAMUSCULAR | Status: AC
  Administered 2017-11-25: 2 mg
  Filled 2017-11-25: qty 2

## 2017-11-25 MED ORDER — ALTEPLASE 2 MG IJ SOLR
INTRAMUSCULAR | Status: AC
  Filled 2017-11-25: qty 2

## 2017-11-25 MED ORDER — AMOXICILLIN-POT CLAVULANATE 875-125 MG PO TABS: 1.0000 | ORAL_TABLET | Freq: Two times a day (BID) | ORAL | 0 refills | Status: DC

## 2017-11-25 MED ORDER — SODIUM CHLORIDE 0.9% FLUSH
10.0000 mL | Freq: Once | INTRAVENOUS | Status: AC
  Administered 2017-11-25: 10 mL
  Filled 2017-11-25: qty 10

## 2017-11-25 NOTE — Telephone Encounter (Signed)
Patient scheduled and AVS//Calendar was printed

## 2017-11-25 NOTE — Progress Notes (Signed)
Left lower extremity venous duplex completed. No evidence of DVT,superficial thrombosis, or Baker's cyst. Toma Copier, RVS  11/25/2017 3:50 PM

## 2017-11-25 NOTE — Progress Notes (Signed)
[name]  [phone] [phone]  Clinic Follow Up Note   Patient Care Team: Beam, Russ Halo, MD as PCP - General (Family Medicine) [name], MD as Consulting Physician (General Surgery) [name], MD as Consulting Physician (Hematology) [name], MD as Consulting Physician (Radiation Oncology) 11/25/2017   CHIEF COMPLAINTS:  Follow up left breast cancer, pT2N0M0, triple positive   Oncology History   Cancer Staging Malignant neoplasm of upper-outer quadrant of left breast in female, estrogen receptor positive ([place]) Staging form: Breast, AJCC 8th Edition - Clinical stage from 07/06/2017: Stage IA (cT1c, cN0, cM0, G3, ER: Positive, PR: Positive, HER2: Positive) - Unsigned Staging comments: Staged at breast conference 9.12.18 - Pathologic stage from 07/13/2017: Stage IA (pT2, pN0, cM0, G3, ER: Positive, PR: Positive, HER2: Positive) - Signed by [name], MD on 08/16/2017       Malignant neoplasm of upper-outer quadrant of left breast in female, estrogen receptor positive ([place])   06/23/2017 Mammogram    Diagnostic mammogram 06/23/17 IMPRESSION: Indeterminate mass with irregular margins at the 2:30 position 4 cm from nipple measuring 1.4 x 0.8 x 1.2 cm in the upper-outer left breast.      06/28/2017 Initial Biopsy    Diagnosis 06/28/17 Breast, left, needle core biopsy, upper outer quadrant, 2:30 o'clock, 4cm from nipple - INVASIVE MAMMARY CARCINOMA WITH CALCIFICATIONS, SEE COMMENT.      06/28/2017 Receptors her2    Estrogen Receptor: 100%, POSITIVE, STRONG STAINING INTENSITY Progesterone Receptor: 20%, POSITIVE, STRONG STAINING INTENSITY Proliferation Marker Ki67: 25% HER2: Postive       06/30/2017 Initial Diagnosis    Malignant neoplasm of upper-outer quadrant of left breast in female, estrogen receptor positive ([place])      07/13/2017 Surgery    Left mastectomy with SLN biopsy performed by Dr Marlou Starks.       07/13/2017 Pathology  Results    Diagnosis  1. Breast, simple mastectomy, Left - INVASIVE AND IN SITU DUCTAL CARCINOMA, 4 CM, MSBR GRADE 3. - MARGINS NOT INVOLVED. - SEE ONCOLOGY TABLE. 2. Lymph node, sentinel, biopsy, Left #1 - ONE BENIGN LYMPH NODE (0/1). 3. Lymph node, biopsy, Left #2 - ONE BENIGN LYMPH NODE (0/1).      07/18/2017 - 07/27/2017 Hospital Admission    Admit date: 07/18/17 Admission diagnosis: spontaneous PNX Additional comments: the patient was admitted with a pneumothorax several days after a port was placed although her post procedure cxr was normal. She had a chest tube placed but had a persistent air leak. The pleurivac was placed to 40 cm h2o suction for several days and the pneumothorax finally resolved.       08/15/2017 Imaging    CT A/P IMPRESSION: 1. No CT findings to suggest metastatic disease involving the abdomen/pelvis. 2. Left chest wall fluid collection as discussed above. 3. Small right pleural effusion with minimal overlying atelectasis. 4. Cholelithiasis. 5. Small periumbilical abdominal wall hernia containing fat.       08/15/2017 Imaging    NM Whole Body Bone Scan FINDINGS: Uptake at the LEFT lateral aspects of the cervical and lower thoracic spine, typically degenerative.  Uptake at the shoulders, elbows, wrists, hips, and knees, typically degenerative.  Asymmetric uptake at the sternoclavicular joints RIGHT greater than LEFT which may also be degenerative.  No definite foci of abnormal osseous tracer accumulation are identified which are suspicious for osseous metastases.  Biconvex thoracolumbar scoliosis.  Expected urinary tract and soft tissue distribution of tracer.  IMPRESSION: Scattered likely degenerative type uptake  as above.  No definite scintigraphic evidence of osseous metastatic disease.      08/16/2017 Genetic Testing    The patient had genetic testing due to a personal and family history of breast cancer.  The [employer]  Multi-Cancer Panel was ordered. The Multi-Cancer Panel offered by [facility] includes sequencing and/or deletion duplication testing of the following 83 genes: ALK, APC, ATM, AXIN2,BAP1,  BARD1, BLM, BMPR1A, BRCA1, BRCA2, BRIP1, CASR, CDC73, CDH1, CDK4, CDKN1B, CDKN1C, CDKN2A (p14ARF), CDKN2A (p16INK4a), CEBPA, CHEK2, CTNNA1, DICER1, DIS3L2, EGFR (c.2369C>T, p.Thr790Met variant only), EPCAM (Deletion/duplication testing only), FH, FLCN, GATA2, GPC3, GREM1 (Promoter region deletion/duplication testing only), HOXB13 (c.251G>A, p.Gly84Glu), HRAS, KIT, MAX, MEN1, MET, MITF (c.952G>A, p.Glu318Lys variant only), MLH1, MSH2, MSH3, MSH6, MUTYH, NBN, NF1, NF2, NTHL1, PALB2, PDGFRA, PHOX2B, PMS2, POLD1, POLE, POT1, PRKAR1A, PTCH1, PTEN, RAD50, RAD51C, RAD51D, RB1, RECQL4, RET, RUNX1, SDHAF2, SDHA (sequence changes only), SDHB, SDHC, SDHD, SMAD4, SMARCA4, SMARCB1, SMARCE1, STK11, SUFU, TERC, TERT, TMEM127, TP53, TSC1, TSC2, VHL, WRN and WT1.   Results: Negative, no pathogenic variants identified in the genes analyzed.  The date of this test report is 08/16/2017.         08/26/2017 -  Chemotherapy    adjuvant TCHP with Onpro q3weeks for 6 cycles started on 08/26/17 followed herceptin maintenance therapy and anti-estrogen therapy      08/31/2017 Breast US    On physical exam,there is a firm smooth mass in the low left axilla spanning at least 5 cm.  Ultrasound is performed, showing a complicated fluid collection which is predominantly anechoic measuring 5.2 x 3.9 x 3.5 cm. No blood flow was identified within the soft tissue component of the mass on color Doppler imaging. The appearance is most consistent with a postoperative seroma/resolving hematoma.  IMPRESSION: The complex fluid collection in the left axilla is very likely to represent a postoperative seroma/ resolving hematoma.  RECOMMENDATION: Three-month follow-up left axillary ultrasound is recommended.        HISTORY OF PRESENTING ILLNESS:  07/06/17 Kathleen Hanson 82 y.o. female is here because of newly diagnosed Malignant neoplasm of upper-outer quadrant of left breast in female, estrogen receptor positive. She presents to Breast Clinic alone today.   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 Her sister had breast cancer when she was 31. Her father had bladder cancer due to smoking. She denies any other significant medical history. She smoked from [age] yo, less than a pack.   Today she reports to feeling her lump after her mammogram and she does not have any pain.  She denies change in weight, appetite or pain. She has been active with weight watchers and has purposefully lost weight.  She did not ask husband to attend but he would if she needed him to. She has two children, daughter and adopted son. She is retired, but previously a Pharmacist, hospital. 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 She wears hearing aids.    GYN HISTORY  Menarchal: [age] LMP: early 56s in 1992 Contraceptive: No HRT: No GP: G2P1A1, prior miscarriage, Has daughter and adopted son   CURRENT THERAPY:  [facility] every 3 weeks started 08/26/17. Held 11/25/17 due to cellulitis. Herceptin maintenance therapy every 3 weeks   INTERVAL HISTORY:   MYKENNA VIELE presents today for follow up. She reports redness and swelling over her left shin onset 3 days ago. Her hands have also been red. The swelling  began after the redness appeared. She reports that she recovered well after her last cycle of chemo. Her appetite is good.   She reports she has stopped taking 81 mg ASA since her nose bleeds in the past 3 weeks.  On review of systems, pt denies fever, or any other complaints at this time. Pertinent positives are listed and detailed within the above HPI.   MEDICAL HISTORY:  Past Medical History:  Diagnosis Date  . Cancer of left breast ([place])   . Constipation   . Family history of  breast cancer   . Fibrocystic breast   . Hyperlipidemia   . Ocular migraine    "used to have them monthly for a period of time; seemed to stop; now have had a couple in the last week" (07/13/2017)    SURGICAL HISTORY: Past Surgical History:  Procedure Laterality Date  . BREAST BIOPSY Right ~ 2001   BREAST EXCISIONAL BIOPSY  . BREAST LUMPECTOMY Left 2001   chemo and radiation  . CESAREAN SECTION  X 1  . IR CATHETER TUBE CHANGE  07/20/2017  . MASTECTOMY COMPLETE / SIMPLE W/ SENTINEL NODE BIOPSY Left 07/13/2017    LEFT MASTECTOMY WITH SENTINEL NODE MAPPING ERAS PATHWAY [name] 07/13/2017  . MASTECTOMY W/ SENTINEL NODE BIOPSY Left 07/13/2017   Procedure: LEFT MASTECTOMY WITH SENTINEL NODE MAPPING ERAS PATHWAY;  Surgeon: [name], MD;  Location: [facility];  Service: General;  Laterality: Left;  . PORTACATH PLACEMENT Right 07/13/2017  . PORTACATH PLACEMENT Right 07/13/2017   Procedure: INSERTION PORT-A-CATH;  Surgeon: [name], MD;  Location: [facility];  Service: General;  Laterality: Right;  . TONSILLECTOMY      SOCIAL HISTORY: Social History   Socioeconomic History  . Marital status: Married    Spouse name: Not on file  . Number of children: Not on file  . Years of education: Not on file  . Highest education level: Not on file  Social Needs  . Financial resource strain: Not on file  . Food insecurity - worry: Not on file  . Food insecurity - inability: Not on file  . Transportation needs - medical: Not on file  . Transportation needs - non-medical: Not on file  Occupational History  . Not on file  Tobacco Use  . Smoking status: Former Smoker    Packs/day: 0.50    Years: 15.00    Pack years: 7.50    Types: Cigarettes    Last attempt to quit: 1976    Years since quitting: 43.1  . Smokeless tobacco: Never Used  Substance and Sexual Activity  . Alcohol use: Yes    Alcohol/week: 3.0 oz    Types: 5 Glasses of wine per week  . Drug use: No  . Sexual activity: Not Currently   Other Topics Concern  . Not on file  Social History Narrative  . Not on file    FAMILY HISTORY: Family History  Problem Relation Age of Onset  . Cancer Father        bladder cancer  . Breast cancer Sister 55    ALLERGIES:  has No Known Allergies.  MEDICATIONS:  Current Outpatient Medications  Medication Sig Dispense Refill  . dexamethasone (DECADRON) 4 MG tablet Take 2 tablets (8 mg total) by mouth 2 (two) times daily. Start the day before Taxotere. Then again the day after chemo for 3 days. 30 tablet 1  . lidocaine-prilocaine (EMLA) cream Apply to affected area once 30 g 3  . Multiple  Vitamin (MULTIVITAMIN WITH MINERALS) TABS tablet Take 1 tablet by mouth daily.    . polyethylene glycol powder (GLYCOLAX/MIRALAX) powder MIX 17 GRAMS IN LIQUID AND DRINK DAILY IN THE EVENING    . simvastatin (ZOCOR) 40 MG tablet Take 40 mg daily by mouth.    . vitamin C (ASCORBIC ACID) 500 MG tablet Take 500 mg by mouth daily.    Marland Kitchen amoxicillin-clavulanate (AUGMENTIN) 875-125 MG tablet Take 1 tablet by mouth 2 (two) times daily. 14 tablet 0  . aspirin 81 MG tablet Chew 81 mg by mouth daily.     . ondansetron (ZOFRAN) 8 MG tablet Take 1 tablet (8 mg total) by mouth 2 (two) times daily as needed for refractory nausea / vomiting. Start on day 3 after chemo. (Patient not taking: Reported on 09/20/2017) 30 tablet 1  . prochlorperazine (COMPAZINE) 10 MG tablet Take 1 tablet (10 mg total) by mouth every 6 (six) hours as needed (Nausea or vomiting). (Patient not taking: Reported on 09/20/2017) 30 tablet 1   No current facility-administered medications for this visit.     REVIEW OF SYSTEMS:   Constitutional: Denies fevers, chills or abnormal night sweats (+) lower appetite  Eyes: Denies blurriness of vision, double vision or watery eyes Ears, nose, mouth, throat, and face: Denies mucositis or sore throat (+) wears hearing aids Respiratory: Denies cough, dyspnea or wheezes Cardiovascular: Denies  palpitation, chest discomfort or lower extremity swelling Gastrointestinal:  Denies nausea or change in bowel habits (+) heartburn, increased stool output  Skin: (+) skin infection over left shin Lymphatics: Denies new lymphadenopathy or easy bruising Neurological:Denies numbness, tingling or new weaknesses MSK: (+) bone pain in hip and lower back Behavioral/Psych: Mood is stable, no new changes  All other systems were reviewed with the patient and are negative.  PHYSICAL EXAMINATION: ECOG PERFORMANCE STATUS:1  Vitals:   11/25/17 1125  BP: (!) 138/59  Pulse: 82  Resp: 20  Temp: 98.2 F (36.8 C)  SpO2: 100%   Filed Weights   11/25/17 1125  Weight: 143 lb 12.8 oz (65.2 kg)    GENERAL:alert, no distress and comfortable SKIN: (+) Diffuse skin erythremia in the lower part of left lower extremity above the ankle. No ulcers or discharge. 2 + edema from ankle to knee.  EYES: normal, conjunctiva are pink and non-injected, sclera clear OROPHARYNX:no exudate, no erythema and lips, buccal mucosa, and tongue normal  NECK: supple, thyroid normal size, non-tender, without nodularity LYMPH:  no palpable lymphadenopathy in the cervical, axillary or inguinal LUNGS: clear to auscultation and percussion with normal breathing effort HEART: regular rate & rhythm and no murmurs and no lower extremity edema ABDOMEN:abdomen soft, non-tender and normal bowel sounds Musculoskeletal:no cyanosis of digits and no clubbing  PSYCH: alert & oriented x 3 with fluent speech NEURO: no focal motor/sensory deficits BREAST: exam deferred today    LABORATORY DATA:  I have reviewed the data as listed CBC Latest Ref Rng & Units 11/25/2017 11/04/2017 10/14/2017  WBC 3.9 - 10.3 K/uL 3.5(L) 3.5(L) 4.4  Hemoglobin 11.6 - 15.9 g/dL 10.8(L) 11.3(L) 11.6  Hematocrit 34.8 - 46.6 % 32.5(L) 33.3(L) 35.2  Platelets 145 - 400 K/uL 247 289 239    CMP Latest Ref Rng & Units 11/25/2017 11/04/2017 10/14/2017  Glucose 70 - 140  mg/dL 81 95 97  BUN 7 - 26 mg/dL 12 8 11.6  Creatinine 0.60 - 1.10 mg/dL 0.51(L) 0.55(L) 0.5(L)  Sodium 136 - 145 mmol/L 139 138 139  Potassium 3.5 - 5.1 mmol/L  4.5 4.1 3.9  Chloride 98 - 109 mmol/L 105 103 -  CO2 22 - 29 mmol/L _0 Calcium 8.4 - 10.4 mg/dL 8.4 8.8 8.5  Total Protein 6.4 - 8.3 g/dL 5.7(L) 6.1(L) 6.0(L)  Total Bilirubin 0.2 - 1.2 mg/dL 0.3 0.3 0.32  Alkaline Phos 40 - 150 U/L 85 92 90  AST 5 - 34 U/L _1 ALT 0 - 55 U/L _2 PATHOLOGY  Diagnosis 07/13/17 1. Breast, simple mastectomy, Left - INVASIVE AND IN SITU DUCTAL CARCINOMA, 4 CM, MSBR GRADE 3. - MARGINS NOT INVOLVED. - SEE ONCOLOGY TABLE. 2. Lymph node, sentinel, biopsy, Left #1 - ONE BENIGN LYMPH NODE (0/1). 3. Lymph node, biopsy, Left #2 - ONE BENIGN LYMPH NODE (0/1). Microscopic Comment 1. BREAST, INVASIVE TUMOR Procedure: Mastectomy and two sentinel lymph nodes Laterality: Left breast Tumor Size: 4.0 cm Histologic Type: Ductal Grade: 3 Tubular Differentiation: 3 Nuclear Pleomorphism: 2 Mitotic Count: 2 Ductal Carcinoma in Situ (DCIS): Present, intermediate grade Extent of Tumor: Skin: Free of tumor Nipple: Free of tumor Skeletal muscle: Free of tumor Margins: Free of tumor Invasive carcinoma, distance from closest margin: 2 cm from posterior margin DCIS, distance from closest margin: 2 cm from posterior margin Regional Lymph Nodes: Number of Lymph Nodes Examined: 2 Number of Sentinel Lymph Nodes Examined: 2 Lymph Nodes with Macrometastases: 0 Lymph Nodes with Micrometastases: 0 1 of 3 FINAL for MILEE, QUALLS ([id number]) Microscopic Comment(continued) Lymph Nodes with Isolated Tumor Cells: 0 Breast Prognostic Profile: Case number [id number] Estrogen Receptor: 100%, positive, strong staining Progesterone Receptor: 20%, positive, strong staining Her2: Equivocal by FISH, ratio 1.49 and average copy number 4.2. Positive by immunohistochemistry (3+) Ki-67: 25% Best  tumor block for sendout testing: 1C Pathologic Stage Classification (pTNM, AJCC 8th Edition): Primary Tumor (pT): pT2 Regional Lymph Nodes (pN): pN0 Distant Metastases (pM): pMX  Diagnosis 06/28/17 Breast, left, needle core biopsy, upper outer quadrant, 2:30 o'clock, 4cm from nipple - INVASIVE MAMMARY CARCINOMA WITH CALCIFICATIONS, SEE COMMENT. Microscopic Comment The carcinoma appears grade 3. E-cadherin will be ordered and reported in an addendum. Prognostic markers will be ordered. Dr. Melina Copa has reviewed the case. The case was called to [facility] on 06/29/2017. FLUORESCENCE IN-SITU HYBRIDIZATION Results: HER2 - *EQUIVOCAL* (Her2 by IHC ordered) RATIO OF HER2/CEP17 SIGNALS 1.49 AVERAGE HER2 COPY NUMBER PER CELL 4.20 Reference Range: NEGATIVE HER2/CEP17 Ratio <2.0 and average HER2 copy number <4.0 EQUIVOCAL HER2/CEP17 Ratio <2.0 and average HER2 copy number >=4.0 and <6.0 POSITIVE HER2/CEP17 Ratio >=2.0 or <2.0 and average HER2 copy number >=6.0 PROGNOSTIC INDICATORS Results: IMMUNOHISTOCHEMICAL AND MORPHOMETRIC ANALYSIS PERFORMED MANUALLY Estrogen Receptor: 100%, POSITIVE, STRONG STAINING INTENSITY Progesterone Receptor: 20%, POSITIVE, STRONG STAINING INTENSITY Proliferation Marker Ki67: 25% 1 of 3 FINAL for IONE, SANDUSKY [id number]) ADDITIONAL INFORMATION:(continued) REFERENCE RANGE ESTROGEN RECEPTOR NEGATIVE 0% POSITIVE =>1% REFERENCE RANGE PROGESTERONE RECEPTOR NEGATIVE 0% POSITIVE =>1% All controls stained appropriately    RADIOGRAPHIC STUDIES: I have personally reviewed the radiological images as listed and agreed with the findings in the report.  DG Bone Density 10/11/17 ASSESSMENT: The BMD measured at AP Spine L1-L3 is 1.072 g/cm2 with a T-score of -0.9.   NM Whole Body Scan 08/15/17 FINDINGS: Uptake at the LEFT lateral aspects of the cervical and lower thoracic spine, typically degenerative. Uptake at the shoulders, elbows, wrists,  hips, and knees, typically degenerative. Asymmetric uptake at the sternoclavicular joints RIGHT greater than LEFT which may also be degenerative. No definite foci  of abnormal osseous tracer accumulation are identified which are suspicious for osseous metastases. Biconvex thoracolumbar scoliosis. Expected urinary tract and soft tissue distribution of tracer. IMPRESSION: Scattered likely degenerative type uptake as above. No definite scintigraphic evidence of osseous metastatic disease.  CT A/P10/22/18 IMPRESSION: 1. No CT findings to suggest metastatic disease involving the abdomen/pelvis. 2. Left chest wall fluid collection as discussed above. 3. Small right pleural effusion with minimal overlying atelectasis. 4. Cholelithiasis. 5. Small periumbilical abdominal wall hernia containing fat.  No results found.  ASSESSMENT & PLAN:  ZANDRIA WOLDT is a 82 y.o. caucasian female with a history of breast cancer, HLD, presented with screening discovered left breast cancer.  1. Malignant neoplasm of upper-outer quadrant of left breast in female, invasive ductal carcinoma, pT2N0M0, Stage: 1A, triple positive, Grade 3 -She underwent mastectomy on 07/13/17, I previously reviewed her surgical pathology results with patient in details. -Her primary tumor was 4 cm, grade 3, triple positive, surgical margins were negative, 2 sentinel lymph nodes were negative. -We discussed her invasive ductal tumor is early stage, but the Biology is more aggressive, due to HER2 positive, grade 3. She has moderate to high risk of recurrence after surgical resection. -She started adjuvant TCH with onpro on 08/26/17, with moderate side effects and overall not feeling well.  -I addressed her concern for quality of life while on chemotherapy. I explained if not tolerable we can reduce her dose or only do 4-5 treatments instead of 6.  -I previously suggested peer support group to have someone to talk to about her treatment.    -We discussed that when her chemo is finished we will continue with just Herceptin for a total of 1 year.  -She has had more fatigue and side effects from chemotherapy, and I wanted to cancel the last 2 cycle chemotherapy.  However after a second thought, she decided to try cycle 5.  I will reduce her dose due to her advanced age and tolerance issue. -Labs reviewed today (11/25/17) she is slightly anemic but her kidney and liver functions are normal.  -Pt has a repeat breast US scheduled for next week to monitor a fluid filled cyst near her left armpit.  -I will hold her chemo today due to cellulitis over the left shin. She will start Augmentin and continue for 7 days. Plan to restart chemo the week of 2/11 with dose reduction  -We also discussed adjuvant endocrine therapy with aromatase inhibitor after she completes chemotherapy.   2. 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 Osteopenia - 03/18/2011, DEXA T = -1.9 -03/27/2014, DEXA  T = -1.4  -I previously encouraged her to take calcium and vitamin D supplement -10/11/2017, DEXA T -0.9  4. Bone pain, heartburn, increased BM, fatigue, secondary to chemo treatment -she experienced bone pain from onpro. I suggest she take Claritin once daily for 5 days after chemo and no more than 367m of tylenol daily.  -I suggest she uses OTC Prilosec or Pepcid for heartburn, if not enough I can call in a prescription  -For her BM she can hold on her laxative tonight and if her BM continue to increase she can take imodium -Encouraged her to consider nutritional supplement, and to be physically active during the chemo  5. Cellulitis on the left shin -She has a skin infection and swelling over her  right shin that is possibly cellulitis. I will start her on an augmentin. She is not  allergic to any antibiotics -I will also order a doppler to rule out DVT.  -I will hold chemo today and restart the weak of 2/11  PLAN: Chemo on 2/13, held today due to cellulitis Start Augmentin BID for 7 days f/u with lacie on 2/13 Doppler of LE to rule out DVT today        No orders of the defined types were placed in this encounter.   All questions were answered. The patient knows to call the clinic with any problems, questions or concerns. I spent 20 minutes counseling the patient face to face. The total time spent in the appointment was 25 minutes and more than 50% was on counseling.  This document serves as a record of services personally performed by [name], MD. It was created on her behalf by Theresia Bough, a trained medical scribe. The creation of this record is based on the scribe's personal observations and the provider's statements to them.   I have reviewed the above documentation for accuracy and completeness, and I agree with the above.     [name], MD 11/25/2017 12:30 PM

## 2017-11-25 NOTE — Progress Notes (Signed)
TPA/Alteplase administered.  After several checks, received blood return @ 1248.  Aspirated TPA & flushed per protocol.  Called [facility] lab & ordered doppler of LLE & scheduled for 3 pm at [facility] & pt knows.

## 2017-11-26 ENCOUNTER — Encounter: Payer: Self-pay | Admitting: Hematology

## 2017-11-28 ENCOUNTER — Inpatient Hospital Stay (HOSPITAL_BASED_OUTPATIENT_CLINIC_OR_DEPARTMENT_OTHER): Payer: Medicare HMO | Admitting: Medical

## 2017-11-28 ENCOUNTER — Telehealth: Payer: Self-pay | Admitting: *Deleted

## 2017-11-28 VITALS — BP 146/78 | HR 113 | Temp 98.0°F | Resp 17 | Ht 64.0 in | Wt 144.6 lb

## 2017-11-28 DIAGNOSIS — C50412 Malignant neoplasm of upper-outer quadrant of left female breast: Secondary | ICD-10-CM

## 2017-11-28 DIAGNOSIS — Z87891 Personal history of nicotine dependence: Secondary | ICD-10-CM

## 2017-11-28 DIAGNOSIS — Z5112 Encounter for antineoplastic immunotherapy: Secondary | ICD-10-CM | POA: Diagnosis not present

## 2017-11-28 DIAGNOSIS — L03116 Cellulitis of left lower limb: Secondary | ICD-10-CM | POA: Diagnosis not present

## 2017-11-28 DIAGNOSIS — M8580 Other specified disorders of bone density and structure, unspecified site: Secondary | ICD-10-CM

## 2017-11-28 DIAGNOSIS — Z803 Family history of malignant neoplasm of breast: Secondary | ICD-10-CM

## 2017-11-28 MED ORDER — SULFAMETHOXAZOLE-TRIMETHOPRIM 800-160 MG PO TABS: 1.0000 | ORAL_TABLET | Freq: Two times a day (BID) | ORAL | 0 refills | Status: DC

## 2017-11-28 NOTE — Telephone Encounter (Signed)
Pt called requesting a call back from nurse.  Spoke with pt, and was informed that pt was diagnosed with cellulitis LE on Friday 11/25/17.  Stated currently is taking antibiotics.  Pt stated she did not think the abx is helping;  Noted more swelling in Left foot, and today, pt noticed some swelling in Right foot.  Stated Left leg is warm to touch, but not the Right leg.  Pt is concerned, and wanted to know what she should do. Lucianne Lei, [facility] notified.  Spoke with pt again, and instructed pt to come in now to see [facility] for evaluation.  Pt voiced understanding, and will be en route. Pt's   Phone     [phone].

## 2017-11-30 ENCOUNTER — Other Ambulatory Visit: Payer: Self-pay | Admitting: General Surgery

## 2017-11-30 ENCOUNTER — Ambulatory Visit
Admission: RE | Admit: 2017-11-30 | Discharge: 2017-11-30 | Disposition: A | Discharge status: Home / Self Care | Payer: Medicare HMO | Source: Ambulatory Visit | Attending: General Surgery | Admitting: General Surgery

## 2017-11-30 DIAGNOSIS — N6489 Other specified disorders of breast: Secondary | ICD-10-CM

## 2017-11-30 DIAGNOSIS — R2232 Localized swelling, mass and lump, left upper limb: Secondary | ICD-10-CM

## 2017-11-30 DIAGNOSIS — Z1231 Encounter for screening mammogram for malignant neoplasm of breast: Secondary | ICD-10-CM

## 2017-11-30 NOTE — Progress Notes (Signed)
[facility] Progress Note   Kathleen Hanson [id number] Mar 14, 1936 82 y.o.  Kathleen Hanson is managed by Dr. [name]  Actively treated with chemotherapy: yes  Current Therapy: Carboplatin, docetaxel, and trastuzumab with PEG filgrastim support  Last Treated: 11/04/2017 (cycle 4, day 1)  Assessment: Plan:    Cellulitis of left lower extremity - Plan: sulfamethoxazole-trimethoprim (BACTRIM DS,SEPTRA DS) 800-160 MG tablet   Cellulitis of the left lower extremity: The patient was instructed to stop Augmentin.  She was given a prescription for Bactrim DS p.o. twice daily times 7 days.  Please see After Visit Summary for patient specific instructions.  Future Appointments  Date Time Provider [place]  12/07/2017 10:15 AM [facility] LAB 6 [facility] None  12/07/2017 10:30 AM [facility] INJ NURSE [facility] None  12/07/2017 11:00 AM Alla Feeling, NP [facility] None  12/07/2017 12:00 PM [facility] E16 [facility] None  02/08/2018 10:00 AM [facility] ECHO 1-BUZZ [facility]  02/08/2018 11:00 AM Bensimhon, Shaune Pascal, MD [facility] None    No orders of the defined types were placed in this encounter.      Subjective:   [name] is a 82 y.o. (DOB [birth year]) female.  Chief Complaint: No chief complaint on file.   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  She is status post cycle 4, day 1 of chemotherapy of carboplatin, docetaxel, and trastuzumab.  She was last treated on 11/04/2017.  She was last seen by Dr. [name] on 11/25/2017 when she was diagnosed with cellulitis of her left lower extremity over the left shin.  Patient was placed on Augmentin.  She was referred for a Doppler ultrasound of her left lower extremity which returned showing no evidence of a DVT.  She presents to the clinic today reporting that she believe that  her cellulitis is no better and is perhaps worse.  Her left lower extremity over her shin continues to be edematous with erythema and increased warmth.  She denies fevers, chills, or sweats.  Medications: I have reviewed the patient's current medications.  Allergies: No Known Allergies  Past Medical History:  Diagnosis Date  . Cancer of left breast ([place])   . Constipation   . Family history of breast cancer   . Fibrocystic breast   . Hyperlipidemia   . Ocular migraine    "used to have them monthly for a period of time; seemed to stop; now have had a couple in the last week" (07/13/2017)    Past Surgical History:  Procedure Laterality Date  . BREAST BIOPSY Right ~ 2001   BREAST EXCISIONAL BIOPSY  . BREAST LUMPECTOMY Left 2001   chemo and radiation  . CESAREAN SECTION  X 1  . IR CATHETER TUBE CHANGE  07/20/2017  . MASTECTOMY COMPLETE / SIMPLE W/ SENTINEL NODE BIOPSY Left 07/13/2017    LEFT MASTECTOMY WITH SENTINEL NODE MAPPING ERAS PATHWAY [name] 07/13/2017  . MASTECTOMY W/ SENTINEL NODE BIOPSY Left 07/13/2017   Procedure: LEFT MASTECTOMY WITH SENTINEL NODE MAPPING ERAS PATHWAY;  Surgeon: [name], MD;  Location: [facility];  Service: General;  Laterality: Left;  . PORTACATH PLACEMENT Right 07/13/2017  . PORTACATH PLACEMENT Right 07/13/2017   Procedure: INSERTION PORT-A-CATH;  Surgeon: [name], MD;  Location: [facility];  Service: General;  Laterality: Right;  . TONSILLECTOMY      Family History  Problem Relation Age of Onset  . Cancer Father        bladder cancer  . Breast cancer Sister 77    Social History   Socioeconomic History  . Marital status: Married    Spouse name: Not on file  . Number of children: Not on file  . Years of education: Not on file  . Highest education level: Not on file  Social Needs  . Financial resource strain: Not on file  . Food insecurity - worry: Not on file  . Food insecurity - inability: Not on file  . Transportation needs - medical:  Not on file  . Transportation needs - non-medical: Not on file  Occupational History  . Not on file  Tobacco Use  . Smoking status: Former Smoker    Packs/day: 0.50    Years: 15.00    Pack years: 7.50    Types: Cigarettes    Last attempt to quit: 1976    Years since quitting: 43.1  . Smokeless tobacco: Never Used  Substance and Sexual Activity  . Alcohol use: Yes    Alcohol/week: 3.0 oz    Types: 5 Glasses of wine per week  . Drug use: No  . Sexual activity: Not Currently  Other Topics Concern  . Not on file  Social History Narrative  . Not on file    Past Medical History, Surgical history, Social history, and Family history were reviewed and updated as appropriate.   Please see review of systems for further details on the patient's review from today.   Review of Systems:  Review of Systems  Constitutional: Negative for chills, diaphoresis and fever.  Musculoskeletal:       Edema, erythema, and increased warmth of the left shin.  Skin: Positive for color change.    Objective:   Physical Exam:  BP (!) 146/78 (BP Location: Right Arm, Patient Position: Sitting)   Pulse (!) 113   Temp 98 F (36.7 C) (Oral)   Resp 17   Ht 5\' 4"  (1.626 m)   Wt 144 lb 9.6 oz (65.6 kg)   SpO2 98%   BMI 24.82 kg/m  ECOG: 0  Physical Exam  Constitutional: No distress.  Cardiovascular: Normal rate, regular rhythm and normal heart sounds. Exam reveals no gallop and no friction rub.  No murmur heard. Pulmonary/Chest: Effort normal and breath sounds normal. No respiratory distress. She has no wheezes. She has no rales.  Neurological: She is alert. Coordination normal.  Skin: Skin is warm and dry. She is not diaphoretic. There is erythema.  Erythema, 1+ edema, and increased warmth of the left lower extremity over the shin.  Psychiatric: She has a normal mood and affect. Her behavior is normal. Judgment and thought content normal.    Lab Review:     Component Value Date/Time   NA 139  11/25/2017 0950   NA 139 10/14/2017 1052   K 4.5 11/25/2017 0950   K 3.9 10/14/2017 1052   CL 105 11/25/2017 0950   CO2 27 11/25/2017 0950   CO2 25 10/14/2017 1052   GLUCOSE 81 11/25/2017 0950   GLUCOSE 97 10/14/2017 1052   BUN 12 11/25/2017 0950   BUN 11.6 10/14/2017 1052   CREATININE 0.51 (L) 11/25/2017 0950   CREATININE 0.5 (L) 10/14/2017 1052   CALCIUM 8.4 11/25/2017 0950   CALCIUM 8.5 10/14/2017 1052   PROT 5.7 (L) 11/25/2017 0950   PROT 6.0 (L) 10/14/2017 1052   ALBUMIN 3.1 (L) 11/25/2017 4166  ALBUMIN 3.4 (L) 10/14/2017 1052   AST 15 11/25/2017 0950   AST 19 10/14/2017 1052   ALT 13 11/25/2017 0950   ALT 18 10/14/2017 1052   ALKPHOS 85 11/25/2017 0950   ALKPHOS 90 10/14/2017 1052   BILITOT 0.3 11/25/2017 0950   BILITOT 0.32 10/14/2017 1052   GFRNONAA >60 11/25/2017 0950   GFRAA >60 11/25/2017 0950       Component Value Date/Time   WBC 3.5 (L) 11/25/2017 0950   RBC 3.42 (L) 11/25/2017 0950   HGB 10.8 (L) 11/25/2017 0950   HGB 11.6 10/14/2017 1052   HCT 32.5 (L) 11/25/2017 0950   HCT 35.2 10/14/2017 1052   PLT 247 11/25/2017 0950   PLT 239 10/14/2017 1052   MCV 94.9 11/25/2017 0950   MCV 94.1 10/14/2017 1052   MCH 31.6 11/25/2017 0950   MCHC 33.4 11/25/2017 0950   RDW 17.3 (H) 11/25/2017 0950   RDW 15.5 (H) 10/14/2017 1052   LYMPHSABS 0.5 (L) 11/25/2017 0950   LYMPHSABS 0.8 (L) 10/14/2017 1052   MONOABS 0.4 11/25/2017 0950   MONOABS 0.4 10/14/2017 1052   EOSABS 0.2 11/25/2017 0950   EOSABS 0.3 10/14/2017 1052   BASOSABS 0.0 11/25/2017 0950   BASOSABS 0.0 10/14/2017 1052   -------------------------------

## 2017-12-01 ENCOUNTER — Telehealth: Payer: Self-pay | Admitting: *Deleted

## 2017-12-01 NOTE — Telephone Encounter (Signed)
Pt called requesting a call back from nurse.   Spoke with pt, and was informed that pt started Bactrim on Monday PM 11/28/17.   Stated the cellulitis on Left LE is still there, and the Right foot edematous.  Asked if the redness on LEs has worsened, pt stated " I could not tell " .  Asked if the redness had spread to upper legs, pt stated NO.  Reassured pt that it would take at least a week to have relief of symptoms.  Reinforced that pt needs to elevate LEs with sitting , and sleeping at night.  Pt asked if she should apply ice to LEs to help with swelling as encouraged by her friends.  Informed pt that would not be advisable; pt also has question about being on bedrest; informed pt that she should be mobile as tolerated and reinforced of elevating LEs with sitting and sleeping.  Offered pt x 2 of seeing [facility] on Friday, but pt declined stating she would like to wait until next week to see Dr. Burr Medico at scheduled appts. Pt understood to call early in am Friday if she would like to be seen by [facility].   Lucianne Lei, [place] notified of above info. Pt's    Phone      [phone].

## 2017-12-02 NOTE — Telephone Encounter (Signed)
Yes continue abx and elevate legs when resting, remain mobile. Perhaps RN could call her mid-morning to see how she is doing?   Thanks, Regan Rakers

## 2017-12-05 ENCOUNTER — Telehealth: Payer: Self-pay | Admitting: *Deleted

## 2017-12-05 NOTE — Telephone Encounter (Signed)
Pt left vm reporting that she still has infection in her leg  Is supposed to have chemo on Wednesday & wonders if she should postpone treatment &/or see someone.  Discussed with Dr Burr Medico & pt should come in tomorrow.  Pt reports that she only has ATB through today.  Informed that Dr Burr Medico will let her know what she needs to do after she sees her in am.  Message to scheduler to add at 8 am for lab & 8:30 am for MD visit.

## 2017-12-06 ENCOUNTER — Inpatient Hospital Stay (HOSPITAL_BASED_OUTPATIENT_CLINIC_OR_DEPARTMENT_OTHER): Payer: Medicare HMO | Admitting: Hematology

## 2017-12-06 ENCOUNTER — Telehealth: Payer: Self-pay | Admitting: Hematology

## 2017-12-06 ENCOUNTER — Inpatient Hospital Stay: Payer: Medicare HMO

## 2017-12-06 DIAGNOSIS — M8580 Other specified disorders of bone density and structure, unspecified site: Secondary | ICD-10-CM

## 2017-12-06 DIAGNOSIS — Z170 Estrogen receptor positive status [ER+]: Secondary | ICD-10-CM

## 2017-12-06 DIAGNOSIS — L03116 Cellulitis of left lower limb: Secondary | ICD-10-CM

## 2017-12-06 DIAGNOSIS — C50412 Malignant neoplasm of upper-outer quadrant of left female breast: Secondary | ICD-10-CM

## 2017-12-06 DIAGNOSIS — Z5112 Encounter for antineoplastic immunotherapy: Secondary | ICD-10-CM | POA: Diagnosis not present

## 2017-12-06 DIAGNOSIS — Z9012 Acquired absence of left breast and nipple: Secondary | ICD-10-CM | POA: Diagnosis not present

## 2017-12-06 LAB — CBC WITH DIFFERENTIAL/PLATELET
Basophils Absolute: 0 10*3/uL (ref 0.0–0.1)
Basophils Relative: 0 %
EOS PCT: 2 %
Eosinophils Absolute: 0.1 10*3/uL (ref 0.0–0.5)
HCT: 37.3 % (ref 34.8–46.6)
Hemoglobin: 12.1 g/dL (ref 11.6–15.9)
LYMPHS ABS: 0.4 10*3/uL — AB (ref 0.9–3.3)
LYMPHS PCT: 6 %
MCH: 31.5 pg (ref 25.1–34.0)
MCHC: 32.4 g/dL (ref 31.5–36.0)
MCV: 97.1 fL (ref 79.5–101.0)
MONO ABS: 0.3 10*3/uL (ref 0.1–0.9)
Monocytes Relative: 5 %
Neutro Abs: 5.2 10*3/uL (ref 1.5–6.5)
Neutrophils Relative %: 87 %
PLATELETS: 167 10*3/uL (ref 145–400)
RBC: 3.84 MIL/uL (ref 3.70–5.45)
RDW: 15.9 % — ABNORMAL HIGH (ref 11.2–14.5)
WBC: 6.1 10*3/uL (ref 3.9–10.3)

## 2017-12-06 LAB — COMPREHENSIVE METABOLIC PANEL
ALBUMIN: 3.4 g/dL — AB (ref 3.5–5.0)
ALT: 19 U/L (ref 0–55)
AST: 23 U/L (ref 5–34)
Alkaline Phosphatase: 95 U/L (ref 40–150)
Anion gap: 12 — ABNORMAL HIGH (ref 3–11)
BUN: 11 mg/dL (ref 7–26)
CHLORIDE: 101 mmol/L (ref 98–109)
CO2: 23 mmol/L (ref 22–29)
CREATININE: 0.66 mg/dL (ref 0.60–1.10)
Calcium: 8.6 mg/dL (ref 8.4–10.4)
GFR calc Af Amer: >60 mL/min (ref >60)
GFR calc non Af Amer: >60 mL/min (ref >60)
GLUCOSE: 113 mg/dL (ref 70–140)
POTASSIUM: 3.9 mmol/L (ref 3.5–5.1)
Sodium: 136 mmol/L (ref 136–145)
Total Bilirubin: 0.4 mg/dL (ref 0.2–1.2)
Total Protein: 6.3 g/dL — ABNORMAL LOW (ref 6.4–8.3)

## 2017-12-06 MED ORDER — POTASSIUM CHLORIDE CRYS ER 20 MEQ PO TBCR: 20.0000 meq | EXTENDED_RELEASE_TABLET | Freq: Two times a day (BID) | ORAL | 0 refills | Status: DC

## 2017-12-06 MED ORDER — FUROSEMIDE 20 MG PO TABS
20.0000 mg | ORAL_TABLET | Freq: Every day | ORAL | 0 refills | Status: DC
Start: 2017-12-06 — End: 2018-11-17

## 2017-12-06 NOTE — Telephone Encounter (Signed)
Scheduled appt per 2/12 los - Gave patient AVS and calender per los.  

## 2017-12-07 ENCOUNTER — Encounter: Payer: Self-pay | Admitting: Hematology

## 2017-12-07 ENCOUNTER — Other Ambulatory Visit: Payer: Medicare HMO

## 2017-12-07 ENCOUNTER — Inpatient Hospital Stay: Payer: Medicare HMO

## 2017-12-07 ENCOUNTER — Other Ambulatory Visit: Payer: Self-pay

## 2017-12-07 ENCOUNTER — Ambulatory Visit: Payer: Medicare HMO | Admitting: Nurse Practitioner

## 2017-12-07 VITALS — BP 125/57 | HR 86 | Temp 98.7°F | Resp 16

## 2017-12-07 DIAGNOSIS — Z5112 Encounter for antineoplastic immunotherapy: Secondary | ICD-10-CM | POA: Diagnosis not present

## 2017-12-07 DIAGNOSIS — C50412 Malignant neoplasm of upper-outer quadrant of left female breast: Secondary | ICD-10-CM

## 2017-12-07 DIAGNOSIS — Z170 Estrogen receptor positive status [ER+]: Principal | ICD-10-CM

## 2017-12-07 DIAGNOSIS — L03119 Cellulitis of unspecified part of limb: Secondary | ICD-10-CM | POA: Insufficient documentation

## 2017-12-07 MED ORDER — ACETAMINOPHEN 325 MG PO TABS
ORAL_TABLET | ORAL | Status: AC
  Filled 2017-12-07: qty 2

## 2017-12-07 MED ORDER — TRASTUZUMAB CHEMO 150 MG IV SOLR
6.0000 mg/kg | Freq: Once | INTRAVENOUS | Status: AC
  Administered 2017-12-07: 357 mg via INTRAVENOUS
  Filled 2017-12-07: qty 17

## 2017-12-07 MED ORDER — DIPHENHYDRAMINE HCL 25 MG PO CAPS
50.0000 mg | ORAL_CAPSULE | Freq: Once | ORAL | Status: AC
  Administered 2017-12-07: 50 mg via ORAL

## 2017-12-07 MED ORDER — SODIUM CHLORIDE 0.9% FLUSH
10.0000 mL | INTRAVENOUS | Status: DC | PRN
  Administered 2017-12-07: 10 mL
  Filled 2017-12-07: qty 10

## 2017-12-07 MED ORDER — HEPARIN SOD (PORK) LOCK FLUSH 100 UNIT/ML IV SOLN
500.0000 [IU] | Freq: Once | INTRAVENOUS | Status: AC | PRN
  Administered 2017-12-07: 500 [IU]
  Filled 2017-12-07: qty 5

## 2017-12-07 MED ORDER — SODIUM CHLORIDE 0.9% FLUSH
3.0000 mL | INTRAVENOUS | Status: DC | PRN
  Filled 2017-12-07: qty 10

## 2017-12-07 MED ORDER — DIPHENHYDRAMINE HCL 25 MG PO CAPS
ORAL_CAPSULE | ORAL | Status: AC
  Filled 2017-12-07: qty 2

## 2017-12-07 MED ORDER — SODIUM CHLORIDE 0.9 % IV SOLN
Freq: Once | INTRAVENOUS | Status: AC
  Administered 2017-12-07: 13:00:00 via INTRAVENOUS

## 2017-12-07 MED ORDER — ACETAMINOPHEN 325 MG PO TABS
650.0000 mg | ORAL_TABLET | Freq: Once | ORAL | Status: AC
  Administered 2017-12-07: 650 mg via ORAL

## 2017-12-07 NOTE — Patient Instructions (Signed)
[facility] Discharge Instructions for Patients Receiving Chemotherapy  Today you received the following chemotherapy agents Herceptin  To help prevent nausea and vomiting after your treatment, we encourage you to take your nausea medication as directed   If you develop nausea and vomiting that is not controlled by your nausea medication, call the clinic.   BELOW ARE SYMPTOMS THAT SHOULD BE REPORTED IMMEDIATELY:  *FEVER GREATER THAN 100.5 F  *CHILLS WITH OR WITHOUT FEVER  NAUSEA AND VOMITING THAT IS NOT CONTROLLED WITH YOUR NAUSEA MEDICATION  *UNUSUAL SHORTNESS OF BREATH  *UNUSUAL BRUISING OR BLEEDING  TENDERNESS IN MOUTH AND THROAT WITH OR WITHOUT PRESENCE OF ULCERS  *URINARY PROBLEMS  *BOWEL PROBLEMS  UNUSUAL RASH Items with * indicate a potential emergency and should be followed up as soon as possible.  Feel free to call the clinic should you have any questions or concerns. The clinic phone number is [phone].  Please show the CHEMO ALERT CARD at check-in to the Emergency Department and triage nurse.   

## 2017-12-07 NOTE — Progress Notes (Signed)
Kathleen Hanson  [phone] [phone]  Clinic Follow Up Note   Patient Care Team: Beam, Russ Halo, MD as PCP - General (Family Medicine) [name], MD as Consulting Physician (General Surgery) [name], MD as Consulting Physician (Hematology) [name], MD as Consulting Physician (Radiation Oncology) 12/06/2017  CHIEF COMPLAINTS:  Bilateral leg edema and skin redness   Oncology History   Cancer Staging Malignant neoplasm of upper-outer quadrant of left breast in female, estrogen receptor positive ([place]) Staging form: Breast, AJCC 8th Edition - Clinical stage from 07/06/2017: Stage IA (cT1c, cN0, cM0, G3, ER: Positive, PR: Positive, HER2: Positive) - Unsigned Staging comments: Staged at breast conference 9.12.18 - Pathologic stage from 07/13/2017: Stage IA (pT2, pN0, cM0, G3, ER: Positive, PR: Positive, HER2: Positive) - Signed by [name], MD on 08/16/2017       Malignant neoplasm of upper-outer quadrant of left breast in female, estrogen receptor positive (Kathleen Hanson Hanson)   06/23/2017 Mammogram    Diagnostic mammogram 06/23/17 IMPRESSION: Indeterminate mass with irregular margins at Kathleen Hanson 2:30 position 4 cm from nipple measuring 1.4 x 0.8 x 1.2 cm in Kathleen Hanson upper-outer left breast.      06/28/2017 Initial Biopsy    Diagnosis 06/28/17 Breast, left, needle core biopsy, upper outer quadrant, 2:30 o'clock, 4cm from nipple - INVASIVE MAMMARY CARCINOMA WITH CALCIFICATIONS, SEE COMMENT.      06/28/2017 Receptors her2    Estrogen Receptor: 100%, POSITIVE, STRONG STAINING INTENSITY Progesterone Receptor: 20%, POSITIVE, STRONG STAINING INTENSITY Proliferation Marker Ki67: 25% HER2: Postive       06/30/2017 Initial Diagnosis    Malignant neoplasm of upper-outer quadrant of left breast in female, estrogen receptor positive (Kathleen Hanson Hanson)      07/13/2017 Surgery    Left mastectomy with SLN biopsy performed by Dr Marlou Starks.       07/13/2017 Pathology Results    Diagnosis    1. Breast, simple mastectomy, Left - INVASIVE AND IN SITU DUCTAL CARCINOMA, 4 CM, MSBR GRADE 3. - MARGINS NOT INVOLVED. - SEE ONCOLOGY TABLE. 2. Lymph node, sentinel, biopsy, Left #1 - ONE BENIGN LYMPH NODE (0/1). 3. Lymph node, biopsy, Left #2 - ONE BENIGN LYMPH NODE (0/1).      07/18/2017 - 07/27/2017 Hospital Admission    Admit date: 07/18/17 Admission diagnosis: spontaneous PNX Additional comments: Kathleen Hanson patient was admitted with a pneumothorax several days after a port was placed although her post procedure cxr was normal. She had a chest tube placed but had a persistent air leak. Kathleen Hanson pleurivac was placed to 40 cm h2o suction for several days and Kathleen Hanson pneumothorax finally resolved.       08/15/2017 Imaging    CT A/P IMPRESSION: 1. No CT findings to suggest metastatic disease involving Kathleen Hanson abdomen/pelvis. 2. Left chest wall fluid collection as discussed above. 3. Small right pleural effusion with minimal overlying atelectasis. 4. Cholelithiasis. 5. Small periumbilical abdominal wall hernia containing fat.       08/15/2017 Imaging    NM Whole Body Bone Scan FINDINGS: Uptake at Kathleen Hanson LEFT lateral aspects of Kathleen Hanson cervical and lower thoracic spine, typically degenerative.  Uptake at Kathleen Hanson shoulders, elbows, wrists, hips, and knees, typically degenerative.  Asymmetric uptake at Kathleen Hanson sternoclavicular joints RIGHT greater than LEFT which may also be degenerative.  No definite foci of abnormal osseous tracer accumulation are identified which are suspicious for osseous metastases.  Biconvex thoracolumbar scoliosis.  Expected urinary tract and soft tissue distribution of tracer.  IMPRESSION: Scattered likely degenerative type uptake as above.  No definite scintigraphic evidence of osseous metastatic disease.      08/16/2017 Genetic Testing    Kathleen Hanson patient had genetic testing due to a personal and family history of breast cancer.  Kathleen Hanson [facility] Multi-Cancer Panel was  ordered. Kathleen Hanson Multi-Cancer Panel offered by [facility] includes sequencing and/or deletion duplication testing of Kathleen Hanson following 83 genes: ALK, APC, ATM, AXIN2,BAP1,  BARD1, BLM, BMPR1A, BRCA1, BRCA2, BRIP1, CASR, CDC73, CDH1, CDK4, CDKN1B, CDKN1C, CDKN2A (p14ARF), CDKN2A (p16INK4a), CEBPA, CHEK2, CTNNA1, DICER1, DIS3L2, EGFR (c.2369C>T, p.Thr790Met variant only), EPCAM (Deletion/duplication testing only), FH, FLCN, GATA2, GPC3, GREM1 (Promoter region deletion/duplication testing only), HOXB13 (c.251G>A, p.Gly84Glu), HRAS, KIT, MAX, MEN1, MET, MITF (c.952G>A, p.Glu318Lys variant only), MLH1, MSH2, MSH3, MSH6, MUTYH, NBN, NF1, NF2, NTHL1, PALB2, PDGFRA, PHOX2B, PMS2, POLD1, POLE, POT1, PRKAR1A, PTCH1, PTEN, RAD50, RAD51C, RAD51D, RB1, RECQL4, RET, RUNX1, SDHAF2, SDHA (sequence changes only), SDHB, SDHC, SDHD, SMAD4, SMARCA4, SMARCB1, SMARCE1, STK11, SUFU, TERC, TERT, TMEM127, TP53, TSC1, TSC2, VHL, WRN and WT1.   Results: Negative, no pathogenic variants identified in Kathleen Hanson genes analyzed.  Kathleen Hanson date of this test report is 08/16/2017.         08/26/2017 -  Chemotherapy    adjuvant TCHP with Onpro q3weeks for 6 cycles started on 08/26/17 followed herceptin maintenance therapy and anti-estrogen therapy      08/31/2017 Breast US    On physical exam,there is a firm smooth mass in Kathleen Hanson low left axilla spanning at least 5 cm.  Ultrasound is performed, showing a complicated fluid collection which is predominantly anechoic measuring 5.2 x 3.9 x 3.5 cm. No blood flow was identified within Kathleen Hanson soft tissue component of Kathleen Hanson mass on color Doppler imaging. Kathleen Hanson appearance is most consistent with a postoperative seroma/resolving hematoma.  IMPRESSION: Kathleen Hanson complex fluid collection in Kathleen Hanson left axilla is very likely to represent a postoperative seroma/ resolving hematoma.  RECOMMENDATION: Three-month follow-up left axillary ultrasound is recommended.        HISTORY OF PRESENTING ILLNESS: 07/06/17 Kathleen Hanson Hanson  Hanson 82 y.o. female is here because of newly diagnosed Malignant neoplasm of upper-outer quadrant of left breast in female, estrogen receptor positive. She presents to Breast Clinic alone today.   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 Her sister had breast cancer when she was 36. Her father had bladder cancer due to smoking. She denies any other significant medical history. She smoked from [age] yo, less than a pack.   Today she reports to feeling her lump after her mammogram and she does not have any pain.  She denies change in weight, appetite or pain. She has been active with weight watchers and has purposefully lost weight.  She did not ask husband to attend but he would if she needed him to. She has two children, daughter and adopted son. She is retired, but previously a Pharmacist, hospital. 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 She wears hearing aids.    GYN HISTORY  Menarchal: 13-14 LMP: early 9s in 1992 Contraceptive: No HRT: No GP: G2P1A1, prior miscarriage, Has daughter and adopted son   CURRENT THERAPY:  [place] every 3 weeks started 08/26/17. Held 11/25/17 due to cellulitis.   INTERVAL HISTORY:   JANAA ACERO called for an appointment to evaluate her bilateral lower extremity edema and skin erythema.  She was initially seen by me 2 weeks ago for left lower extremity edema and skin erythema, DVT was negative  by Doppler, and I held her chemo and treated her cellulitis with Augmentin.  Her symptoms did not improve much after a course of antibiotics, edema and skin erythema also. She was seen by our symptom management clinic 1 week ago, and he was given Bactrim for 7 days, which she finished. She denies any fever, chills, still feels quite fatigued with low appetite.  MEDICAL HISTORY:  Past Medical History:  Diagnosis Date  . Cancer of left breast ([place])   . Constipation   . Family history of  breast cancer   . Fibrocystic breast   . Hyperlipidemia   . Ocular migraine    "used to have them monthly for a period of time; seemed to stop; now have had a couple in Kathleen Hanson last week" (07/13/2017)    SURGICAL HISTORY: Past Surgical History:  Procedure Laterality Date  . BREAST BIOPSY Right ~ 2001   BREAST EXCISIONAL BIOPSY  . BREAST LUMPECTOMY Left 2001   chemo and radiation  . CESAREAN SECTION  X 1  . IR CATHETER TUBE CHANGE  07/20/2017  . MASTECTOMY COMPLETE / SIMPLE W/ SENTINEL NODE BIOPSY Left 07/13/2017    LEFT MASTECTOMY WITH SENTINEL NODE MAPPING ERAS PATHWAY [name] 07/13/2017  . MASTECTOMY W/ SENTINEL NODE BIOPSY Left 07/13/2017   Procedure: LEFT MASTECTOMY WITH SENTINEL NODE MAPPING ERAS PATHWAY;  Surgeon: [name], MD;  Location: [facility];  Service: General;  Laterality: Left;  . PORTACATH PLACEMENT Right 07/13/2017  . PORTACATH PLACEMENT Right 07/13/2017   Procedure: INSERTION PORT-A-CATH;  Surgeon: [name], MD;  Location: [facility];  Service: General;  Laterality: Right;  . TONSILLECTOMY      SOCIAL HISTORY: Social History   Socioeconomic History  . Marital status: Married    Spouse name: Not on file  . Number of children: Not on file  . Years of education: Not on file  . Highest education level: Not on file  Social Needs  . Financial resource strain: Not on file  . Food insecurity - worry: Not on file  . Food insecurity - inability: Not on file  . Transportation needs - medical: Not on file  . Transportation needs - non-medical: Not on file  Occupational History  . Not on file  Tobacco Use  . Smoking status: Former Smoker    Packs/day: 0.50    Years: 15.00    Pack years: 7.50    Types: Cigarettes    Last attempt to quit: 1976    Years since quitting: 43.1  . Smokeless tobacco: Never Used  Substance and Sexual Activity  . Alcohol use: Yes    Alcohol/week: 3.0 oz    Types: 5 Glasses of wine per week  . Drug use: No  . Sexual activity: Not Currently   Other Topics Concern  . Not on file  Social History Narrative  . Not on file    FAMILY HISTORY: Family History  Problem Relation Age of Onset  . Cancer Father        bladder cancer  . Breast cancer Sister 56    ALLERGIES:  has No Known Allergies.  MEDICATIONS:  Current Outpatient Medications  Medication Sig Dispense Refill  . lidocaine-prilocaine (EMLA) cream Apply to affected area once 30 g 3  . Multiple Vitamin (MULTIVITAMIN WITH MINERALS) TABS tablet Take 1 tablet by mouth daily.    . polyethylene glycol powder (GLYCOLAX/MIRALAX) powder MIX 17 GRAMS IN LIQUID AND DRINK DAILY IN Kathleen Hanson EVENING    . simvastatin (ZOCOR) 40  MG tablet Take 40 mg daily by mouth.    . sulfamethoxazole-trimethoprim (BACTRIM DS,SEPTRA DS) 800-160 MG tablet Take 1 tablet by mouth 2 (two) times daily. 14 tablet 0  . vitamin C (ASCORBIC ACID) 500 MG tablet Take 500 mg by mouth daily.    Marland Kitchen amoxicillin-clavulanate (AUGMENTIN) 875-125 MG tablet Take 1 tablet by mouth 2 (two) times daily. (Patient not taking: Reported on 12/06/2017) 14 tablet 0  . aspirin 81 MG tablet Chew 81 mg by mouth daily.     Marland Kitchen dexamethasone (DECADRON) 4 MG tablet Take 2 tablets (8 mg total) by mouth 2 (two) times daily. Start Kathleen Hanson day before Taxotere. Then again Kathleen Hanson day after chemo for 3 days. (Patient not taking: Reported on 12/06/2017) 30 tablet 1  . furosemide (LASIX) 20 MG tablet Take 1 tablet (20 mg total) by mouth daily. 10 tablet 0  . ondansetron (ZOFRAN) 8 MG tablet Take 1 tablet (8 mg total) by mouth 2 (two) times daily as needed for refractory nausea / vomiting. Start on day 3 after chemo. (Patient not taking: Reported on 09/20/2017) 30 tablet 1  . potassium chloride SA (K-DUR,KLOR-CON) 20 MEQ tablet Take 1 tablet (20 mEq total) by mouth 2 (two) times daily. 10 tablet 0  . prochlorperazine (COMPAZINE) 10 MG tablet Take 1 tablet (10 mg total) by mouth every 6 (six) hours as needed (Nausea or vomiting). (Patient not taking: Reported on  09/20/2017) 30 tablet 1   No current facility-administered medications for this visit.     REVIEW OF SYSTEMS:   Constitutional: Denies fevers, chills or abnormal night sweats (+) lower appetite  Eyes: Denies blurriness of vision, double vision or watery eyes Ears, nose, mouth, throat, and face: Denies mucositis or sore throat (+) wears hearing aids Respiratory: Denies cough, dyspnea or wheezes Cardiovascular: Denies palpitation, chest discomfort or lower extremity swelling Gastrointestinal:  Denies nausea or change in bowel habits (+) heartburn, increased stool output  Skin: (+) skin infection over left shin and right shin  Lymphatics: Denies Kathleen lymphadenopathy or easy bruising Neurological:Denies numbness, tingling or Kathleen weaknesses MSK: (+) bone pain in hip and lower back Behavioral/Psych: Mood is stable, no Kathleen changes  All other systems were reviewed with Kathleen Hanson patient and are negative.  PHYSICAL EXAMINATION: ECOG PERFORMANCE STATUS:1  Vitals:   12/06/17 0952  BP: (!) 151/55  Pulse: (!) 101  Resp: 16  Temp: 98.6 F (37 C)  SpO2: 99%   Filed Weights   12/06/17 0952  Weight: 144 lb 3.2 oz (65.4 kg)    GENERAL:alert, no distress and comfortable SKIN: (+) Diffuse skin erythremia (darker then before) in Kathleen Hanson lower part of bilateral lower extremity over Kathleen Hanson shin and above Kathleen Hanson ankle. No ulcers or discharge. 2 + edema from ankle to knee on left and 1+ edema on right.  EYES: normal, conjunctiva are pink and non-injected, sclera clear OROPHARYNX:no exudate, no erythema and lips, buccal mucosa, and tongue normal  NECK: supple, thyroid normal size, non-tender, without nodularity LYMPH:  no palpable lymphadenopathy in Kathleen Hanson cervical, axillary or inguinal LUNGS: clear to auscultation and percussion with normal breathing effort HEART: regular rate & rhythm and no murmurs and no lower extremity edema ABDOMEN:abdomen soft, non-tender and normal bowel sounds Musculoskeletal:no cyanosis of  digits and no clubbing  PSYCH: alert & oriented x 3 with fluent speech NEURO: no focal motor/sensory deficits BREAST: exam deferred today    LABORATORY DATA:  I have reviewed Kathleen Hanson data as listed CBC Latest Ref Rng & Units 12/06/2017  11/25/2017 11/04/2017  WBC 3.9 - 10.3 K/uL 6.1 3.5(L) 3.5(L)  Hemoglobin 11.6 - 15.9 g/dL 12.1 10.8(L) 11.3(L)  Hematocrit 34.8 - 46.6 % 37.3 32.5(L) 33.3(L)  Platelets 145 - 400 K/uL 167 247 289    CMP Latest Ref Rng & Units 12/06/2017 11/25/2017 11/04/2017  Glucose 70 - 140 mg/dL 113 81 95  BUN 7 - 26 mg/dL '11 12 8  ' Creatinine 0.60 - 1.10 mg/dL 0.66 0.51(L) 0.55(L)  Sodium 136 - 145 mmol/L 136 139 138  Potassium 3.5 - 5.1 mmol/L 3.9 4.5 4.1  Chloride 98 - 109 mmol/L 101 105 103  CO2 22 - 29 mmol/L '23 27 26  ' Calcium 8.4 - 10.4 mg/dL 8.6 8.4 8.8  Total Protein 6.4 - 8.3 g/dL 6.3(L) 5.7(L) 6.1(L)  Total Bilirubin 0.2 - 1.2 mg/dL 0.4 0.3 0.3  Alkaline Phos 40 - 150 U/L 95 85 92  AST 5 - 34 U/L '23 15 16  ' ALT 0 - 55 U/L '19 13 16   ' PATHOLOGY  Diagnosis 07/13/17 1. Breast, simple mastectomy, Left - INVASIVE AND IN SITU DUCTAL CARCINOMA, 4 CM, MSBR GRADE 3. - MARGINS NOT INVOLVED. - SEE ONCOLOGY TABLE. 2. Lymph node, sentinel, biopsy, Left #1 - ONE BENIGN LYMPH NODE (0/1). 3. Lymph node, biopsy, Left #2 - ONE BENIGN LYMPH NODE (0/1). Microscopic Comment 1. BREAST, INVASIVE TUMOR Procedure: Mastectomy and two sentinel lymph nodes Laterality: Left breast Tumor Size: 4.0 cm Histologic Type: Ductal Grade: 3 Tubular Differentiation: 3 Nuclear Pleomorphism: 2 Mitotic Count: 2 Ductal Carcinoma in Situ (DCIS): Present, intermediate grade Extent of Tumor: Skin: Free of tumor Nipple: Free of tumor Skeletal muscle: Free of tumor Margins: Free of tumor Invasive carcinoma, distance from closest margin: 2 cm from posterior margin DCIS, distance from closest margin: 2 cm from posterior margin Regional Lymph Nodes: Number of Lymph Nodes Examined: 2 Number of  Sentinel Lymph Nodes Examined: 2 Lymph Nodes with Macrometastases: 0 Lymph Nodes with Micrometastases: 0 1 of 3 FINAL for DAESIA, ZYLKA ([id number]) Microscopic Comment(continued) Lymph Nodes with Isolated Tumor Cells: 0 Breast Prognostic Profile: Case number [id number] Estrogen Receptor: 100%, positive, strong staining Progesterone Receptor: 20%, positive, strong staining Her2: Equivocal by FISH, ratio 1.49 and average copy number 4.2. Positive by immunohistochemistry (3+) Ki-67: 25% Best tumor block for sendout testing: 1C Pathologic Stage Classification (pTNM, AJCC 8th Edition): Primary Tumor (pT): pT2 Regional Lymph Nodes (pN): pN0 Distant Metastases (pM): pMX  Diagnosis 06/28/17 Breast, left, needle core biopsy, upper outer quadrant, 2:30 o'clock, 4cm from nipple - INVASIVE MAMMARY CARCINOMA WITH CALCIFICATIONS, SEE COMMENT. Microscopic Comment Kathleen Hanson carcinoma appears grade 3. E-cadherin will be ordered and reported in an addendum. Prognostic markers will be ordered. Dr. Melina Copa has reviewed Kathleen Hanson case. Kathleen Hanson case was called to Kathleen Hanson [place] on 06/29/2017. FLUORESCENCE IN-SITU HYBRIDIZATION Results: HER2 - *EQUIVOCAL* (Her2 by IHC ordered) RATIO OF HER2/CEP17 SIGNALS 1.49 AVERAGE HER2 COPY NUMBER PER CELL 4.20 Reference Range: NEGATIVE HER2/CEP17 Ratio <2.0 and average HER2 copy number <4.0 EQUIVOCAL HER2/CEP17 Ratio <2.0 and average HER2 copy number >=4.0 and <6.0 POSITIVE HER2/CEP17 Ratio >=2.0 or <2.0 and average HER2 copy number >=6.0 PROGNOSTIC INDICATORS Results: IMMUNOHISTOCHEMICAL AND MORPHOMETRIC ANALYSIS PERFORMED MANUALLY Estrogen Receptor: 100%, POSITIVE, STRONG STAINING INTENSITY Progesterone Receptor: 20%, POSITIVE, STRONG STAINING INTENSITY Proliferation Marker Ki67: 25% 1 of 3 FINAL for MAKYNNA, MANOCCHIO [phone]) ADDITIONAL INFORMATION:(continued) REFERENCE RANGE ESTROGEN RECEPTOR NEGATIVE 0% POSITIVE =>1% REFERENCE RANGE  PROGESTERONE RECEPTOR NEGATIVE 0% POSITIVE =>1% All controls stained appropriately  RADIOGRAPHIC STUDIES: I have personally reviewed Kathleen Hanson radiological images as listed and agreed with Kathleen Hanson findings in Kathleen Hanson report.  DG Bone Density 10/11/17 ASSESSMENT: Kathleen Hanson BMD measured at AP Spine L1-L3 is 1.072 g/cm2 with a T-score of -0.9.   NM Whole Body Scan 08/15/17 FINDINGS: Uptake at Kathleen Hanson LEFT lateral aspects of Kathleen Hanson cervical and lower thoracic spine, typically degenerative. Uptake at Kathleen Hanson shoulders, elbows, wrists, hips, and knees, typically degenerative. Asymmetric uptake at Kathleen Hanson sternoclavicular joints RIGHT greater than LEFT which may also be degenerative. No definite foci of abnormal osseous tracer accumulation are identified which are suspicious for osseous metastases. Biconvex thoracolumbar scoliosis. Expected urinary tract and soft tissue distribution of tracer. IMPRESSION: Scattered likely degenerative type uptake as above. No definite scintigraphic evidence of osseous metastatic disease.  CT A/P10/22/18 IMPRESSION: 1. No CT findings to suggest metastatic disease involving Kathleen Hanson abdomen/pelvis. 2. Left chest wall fluid collection as discussed above. 3. Small right pleural effusion with minimal overlying atelectasis. 4. Cholelithiasis. 5. Small periumbilical abdominal wall hernia containing fat.   ASSESSMENT & PLAN:  Kathleen Hanson Hanson is a 82 y.o. caucasian female with a history of breast cancer, HLD, presented with screening discovered left breast cancer.  1. Bilateral lower extremity cellulitis -Complete 2 courses of antibiotics (Augmentin and Bactrim for 7 days each), her overall skin erythema has improved, but not resolved.  Both of her lower extremity are still edematous.  -Doppler of lower extremities were negative for DVT -Afebrile, white count is normal, I think she is recovering, I do not think she need more antibiotics at this point. -Strongly encouraged her to elevate her  legs, and wear compression stockings, I also given her a prescription of Lasix 42m daily for 3-5 days (always potassium 20 mEq daily) to Kathleen Hanson improve her edema, and hopefully her skin inflammation will resolve quickly if edema improves. -Due to her fatigue, and recent bilateral lower extremity cellulitis, I will hold her chemo tomorrow, and to proceed with Herceptin alone   2. Malignant neoplasm of upper-outer quadrant of left breast in female, invasive ductal carcinoma, pT2N0M0, Stage: 1A, triple positive, Grade 3 -She underwent mastectomy on 07/13/17, I previously reviewed her surgical pathology results with patient in details. -Her primary tumor was 4 cm, grade 3, triple positive, surgical margins were negative, 2 sentinel lymph nodes were negative. -We discussed her invasive ductal tumor is early stage, but Kathleen Hanson Biology is more aggressive, due to HER2 positive, grade 3. She has moderate to high risk of recurrence after surgical resection. -She started adjuvant TCH with onpro on 08/26/17, with moderate side effects and overall not feeling well.  -I addressed her concern for quality of life while on chemotherapy. I explained if not tolerable we can reduce her dose or only do 4-5 treatments instead of 6.  -I previously suggested peer support group to have someone to talk to about her treatment.  -We discussed that when her chemo is finished we will continue with just Herceptin for a total of 1 year.  -She has had more fatigue and side effects from chemotherapy, and I wanted to cancel Kathleen Hanson last 2 cycle chemotherapy.  However after a second thought, she decided to try cycle 5.  I will reduce her dose due to her advanced age and tolerance issue. --Due to her recent bilateral lower extremity cellulitis, and residual fatigue from last cycle chemo, I will proceed with Herceptin alone tomorrow.  -If she recovers well, may consider give her cycle 5 dose reduced chemo with next dose  of  Herceptin.   3. History  of invasive and in situ ductal carcinoma of left breast, 10/26/1999, Estrogen receptor negative, stage 1, Grade 3 -Treated in [place]  -s/p Lumpectomy (sentinel node negative, 2-3 removed), chemo, radiation  4. Osteopenia - 03/18/2011, DEXA T = -1.9 -03/27/2014, DEXA  T = -1.4  -I previously encouraged her to take calcium and vitamin D supplement -10/11/2017, DEXA T -0.9  5. Bone pain, heartburn, increased BM, fatigue, secondary to chemo treatment -she experienced bone pain from onpro. I suggest she take Claritin once daily for 5 days after chemo and no more than 353m of tylenol daily.  -I suggest she uses OTC Prilosec or Pepcid for heartburn, if not enough I can call in a prescription  -For her BM she can hold on her laxative tonight and if her BM continue to increase she can take imodium -Encouraged her to consider nutritional supplement, and to be physically active during Kathleen Hanson chemo   PLAN: -We will proceed with Herceptin only tomorrow, hold chemotherapy tomorrow. --lab, flush and chemo TCH in 3 weeks     No orders of Kathleen Hanson defined types were placed in this encounter.   All questions were answered. Kathleen Hanson patient knows to call Kathleen Hanson clinic with any problems, questions or concerns. I spent 20 minutes counseling Kathleen Hanson patient face to face. Kathleen Hanson total time spent in Kathleen Hanson appointment was 25 minutes and more than 50% was on counseling.     YTruitt Merle MD 12/06/2017

## 2017-12-08 ENCOUNTER — Telehealth: Payer: Self-pay | Admitting: *Deleted

## 2017-12-08 NOTE — Telephone Encounter (Addendum)
Pt called & left vm message asking about continuing ATB.  Returned call & pt states she was placed on two different ATB, Amoxicillin 1st & then changed to Bactrim.  She finished Bactrim & still has amoxicillin left & wants to know if she should take it. Reviewed Dr Ernestina Penna note & discussed with her & informed pt to take lasix & K+ as prescribed to get fluid down & keep K+ from getting too low & no need to take additional ATB.  Instructed also to elevate legs as much as possible & try compression stockings.  She was hesitant & states I just want to get over this.  Emotional support given.

## 2017-12-09 ENCOUNTER — Telehealth: Payer: Self-pay | Admitting: *Deleted

## 2017-12-09 NOTE — Telephone Encounter (Signed)
Received vm call from pt stating that she is realizing that she is out of med but has 4 pills left.  Returned call & she has 4 each of her lasix & potassium.  She has been taking lasix 2/d & K+ 2/day.  She doesn't see any improvement of swelling.  She denies fever, worsening of swelling or redness.  Discussed with Dr Burr Medico & since lasix is not helping OK to stop lasix & K+.  Re educated on compression stockings, elevation of lower extremities & increasing oral fluids.  She still has not obtained compression stockings due to cost.  Reminded to call if she develops fever or increased redness or swelling.  Pt expressed understanding.

## 2017-12-12 ENCOUNTER — Telehealth: Payer: Self-pay | Admitting: *Deleted

## 2017-12-12 NOTE — Telephone Encounter (Signed)
Pt called and left [employer] phone number for office to call for authorization of her compression stockings.   Spoke with pt and instructed pt to go to [employer] ( [place] known as [employer] ) for her stockings measurements.  Gave pt direct fax number to nurses' desk for order of stockings to be faxed to our office.  Dr. Burr Medico will review orders, and signs as appropriate.  Informed pt that once order is signed and faxed back to the shop, her insurance will be filed by the store for reimbursement.  Pt voiced understanding.

## 2017-12-14 ENCOUNTER — Inpatient Hospital Stay (HOSPITAL_BASED_OUTPATIENT_CLINIC_OR_DEPARTMENT_OTHER): Payer: Medicare HMO | Admitting: Medical

## 2017-12-14 VITALS — BP 148/61 | HR 93 | Temp 97.5°F | Resp 17 | Ht 64.0 in | Wt 142.0 lb

## 2017-12-14 DIAGNOSIS — M8580 Other specified disorders of bone density and structure, unspecified site: Secondary | ICD-10-CM

## 2017-12-14 DIAGNOSIS — Z170 Estrogen receptor positive status [ER+]: Secondary | ICD-10-CM

## 2017-12-14 DIAGNOSIS — C50412 Malignant neoplasm of upper-outer quadrant of left female breast: Secondary | ICD-10-CM

## 2017-12-14 DIAGNOSIS — Z5112 Encounter for antineoplastic immunotherapy: Secondary | ICD-10-CM | POA: Diagnosis not present

## 2017-12-14 DIAGNOSIS — Z9012 Acquired absence of left breast and nipple: Secondary | ICD-10-CM

## 2017-12-14 DIAGNOSIS — L03119 Cellulitis of unspecified part of limb: Secondary | ICD-10-CM

## 2017-12-14 MED ORDER — CEPHALEXIN 250 MG PO CAPS: 250.0000 mg | ORAL_CAPSULE | Freq: Three times a day (TID) | ORAL | 0 refills | Status: DC

## 2017-12-16 ENCOUNTER — Other Ambulatory Visit: Payer: Medicare HMO

## 2017-12-16 ENCOUNTER — Ambulatory Visit: Payer: Medicare HMO

## 2017-12-16 ENCOUNTER — Ambulatory Visit: Payer: Medicare HMO | Admitting: Hematology

## 2017-12-16 NOTE — Progress Notes (Signed)
[facility] Progress Note   Kathleen Hanson [id number] [birth year] 82 y.o.  Kathleen Hanson is managed by Dr. [name]  Actively treated with chemotherapy: yes  Current Therapy: Herceptin  Last Treated: 12/07/2017 (cycle 5, day 1)  Assessment: Plan:    Cellulitis of lower extremity, unspecified laterality - Plan: cephALEXin (KEFLEX) 250 MG capsule   Cellulitis of the bilateral lower extremities: Patient was given a prescription for Keflex 250 mg p.o. 3 times daily times 7 days.  I also attempted to reassure the patient that it appears that her cellulitis is resolving.   I also gave the patient contact information for the following websites and told her only to use these websites should she have questions regarding health issues.  These website included:  [place]  [facility]  I cautioned her to avoid random websites when seeking medical information.  Please see After Visit Summary for patient specific instructions.  Future Appointments  Date Time Provider [place]  12/28/2017 10:45 AM [facility] LAB 1 [facility] None  12/28/2017 11:00 AM [facility] FLUSH NURSE [facility] None  12/28/2017 11:30 AM [name], MD [facility] None  12/28/2017 12:30 PM [facility] G24 [facility] None  02/08/2018 10:00 AM [facility] ECHO 1-BUZZ [facility]  02/08/2018 11:00 AM Bensimhon, Shaune Pascal, MD [facility] None  05/31/2018 10:30 AM [facility] DIAG TOMO 2 [facility] GI-BREAST CE  05/31/2018 10:40 AM [facility] [place] 2 [facility]BCG[facility] GI-BREAST CE    No orders of the defined types were placed in this encounter.      Subjective:   [name] is a 82 y.o. (DOB 07/05/1936) female.  Chief Complaint:  Chief Complaint  Patient presents with  . 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  She is followed by Dr. [name] and is currently receiving Herceptin only which was last dosed on 12/07/2017.  She has been previously seen for cellulitis of her bilateral lower extremities.  She was started on Augmentin initially but did not note much benefit from this antibiotic.  She was then transition to Bactrim on 11/28/2017.  She presents to the office today with report that she continues to have erythema warmth and mild tenderness of her anterior bilateral lower extremities.  She had been using compression stockings for 2-3 days but stopped them as they were painful.  She spoke with a friend of hers who told her that she should be on Keflex for her cellulitis.  She presents with an article from Wikipedia on cellulitis which she has read and highlighted.  She reports that this article supports her friends assertion that she should be on Keflex.  Medications: I have reviewed the patient's current medications.  Allergies: No Known Allergies  Past Medical History:  Diagnosis Date  . Cancer of left breast ([place])   . Constipation   . Family history of breast cancer   . Fibrocystic breast   . Hyperlipidemia   . Ocular migraine    "used to have them monthly for a period of time; seemed to stop; now have had a couple in the last week" (07/13/2017)    Past Surgical History:  Procedure Laterality Date  . BREAST BIOPSY Right ~ 2001   BREAST EXCISIONAL BIOPSY  . BREAST LUMPECTOMY Left 2001   chemo and radiation  .  CESAREAN SECTION  X 1  . IR CATHETER TUBE CHANGE  07/20/2017  . MASTECTOMY COMPLETE / SIMPLE W/ SENTINEL NODE BIOPSY Left 07/13/2017    LEFT MASTECTOMY WITH SENTINEL NODE MAPPING ERAS PATHWAY [name] 07/13/2017  . MASTECTOMY W/ SENTINEL NODE BIOPSY Left 07/13/2017   Procedure: LEFT MASTECTOMY WITH SENTINEL NODE MAPPING ERAS PATHWAY;  Surgeon: [name], MD;  Location: [place];  Service: General;  Laterality: Left;  . PORTACATH PLACEMENT Right 07/13/2017  . PORTACATH  PLACEMENT Right 07/13/2017   Procedure: INSERTION PORT-A-CATH;  Surgeon: [name], MD;  Location: [place];  Service: General;  Laterality: Right;  . TONSILLECTOMY      Family History  Problem Relation Age of Onset  . Cancer Father        bladder cancer  . Breast cancer Sister 69    Social History   Socioeconomic History  . Marital status: Married    Spouse name: Not on file  . Number of children: Not on file  . Years of education: Not on file  . Highest education level: Not on file  Social Needs  . Financial resource strain: Not on file  . Food insecurity - worry: Not on file  . Food insecurity - inability: Not on file  . Transportation needs - medical: Not on file  . Transportation needs - non-medical: Not on file  Occupational History  . Not on file  Tobacco Use  . Smoking status: Former Smoker    Packs/day: 0.50    Years: 15.00    Pack years: 7.50    Types: Cigarettes    Last attempt to quit: 1976    Years since quitting: 43.1  . Smokeless tobacco: Never Used  Substance and Sexual Activity  . Alcohol use: Yes    Alcohol/week: 3.0 oz    Types: 5 Glasses of wine per week  . Drug use: No  . Sexual activity: Not Currently  Other Topics Concern  . Not on file  Social History Narrative  . Not on file    Past Medical History, Surgical history, Social history, and Family history were reviewed and updated as appropriate.   Please see review of systems for further details on the patient's review from today.   Review of Systems:  Review of Systems  Constitutional: Negative for chills, diaphoresis and fever.  Musculoskeletal: Negative for gait problem.  Skin: Negative for color change and rash.    Objective:   Physical Exam:  BP (!) 148/61 (BP Location: Left Arm, Patient Position: Sitting)   Pulse 93   Temp (!) 97.5 F (36.4 C) (Oral)   Resp 17   Ht 5\' 4"  (1.626 m)   Wt 142 lb (64.4 kg)   SpO2 100%   BMI 24.37 kg/m  ECOG: 0  Physical Exam    Constitutional: No distress.  HENT:  Head: Normocephalic and atraumatic.  Cardiovascular: Normal rate, regular rhythm and normal heart sounds. Exam reveals no gallop and no friction rub.  No murmur heard. Pulmonary/Chest: Effort normal and breath sounds normal. No respiratory distress. She has no wheezes. She has no rales.  Musculoskeletal: She exhibits edema (Trace bilateral lower extremity edema noted just proximal to the ankles.).  Neurological: She is alert. Coordination normal.  Skin: Skin is warm and dry. She is not diaphoretic. There is erythema.  Bilateral erythema and increased warmth noted over the lower extremities proximal to the ankles.  This pattern suggest mild cellulitis which is markedly improved since the patient's initial  presentation.  There is diffuse scaling dry skin.    Lab Review:     Component Value Date/Time   NA 136 12/06/2017 0843   NA 139 10/14/2017 1052   K 3.9 12/06/2017 0843   K 3.9 10/14/2017 1052   CL 101 12/06/2017 0843   CO2 23 12/06/2017 0843   CO2 25 10/14/2017 1052   GLUCOSE 113 12/06/2017 0843   GLUCOSE 97 10/14/2017 1052   BUN 11 12/06/2017 0843   BUN 11.6 10/14/2017 1052   CREATININE 0.66 12/06/2017 0843   CREATININE 0.5 (L) 10/14/2017 1052   CALCIUM 8.6 12/06/2017 0843   CALCIUM 8.5 10/14/2017 1052   PROT 6.3 (L) 12/06/2017 0843   PROT 6.0 (L) 10/14/2017 1052   ALBUMIN 3.4 (L) 12/06/2017 0843   ALBUMIN 3.4 (L) 10/14/2017 1052   AST 23 12/06/2017 0843   AST 19 10/14/2017 1052   ALT 19 12/06/2017 0843   ALT 18 10/14/2017 1052   ALKPHOS 95 12/06/2017 0843   ALKPHOS 90 10/14/2017 1052   BILITOT 0.4 12/06/2017 0843   BILITOT 0.32 10/14/2017 1052   GFRNONAA >60 12/06/2017 0843   GFRAA >60 12/06/2017 0843       Component Value Date/Time   WBC 6.1 12/06/2017 0843   RBC 3.84 12/06/2017 0843   HGB 12.1 12/06/2017 0843   HGB 11.6 10/14/2017 1052   HCT 37.3 12/06/2017 0843   HCT 35.2 10/14/2017 1052   PLT 167 12/06/2017 0843    PLT 239 10/14/2017 1052   MCV 97.1 12/06/2017 0843   MCV 94.1 10/14/2017 1052   MCH 31.5 12/06/2017 0843   MCHC 32.4 12/06/2017 0843   RDW 15.9 (H) 12/06/2017 0843   RDW 15.5 (H) 10/14/2017 1052   LYMPHSABS 0.4 (L) 12/06/2017 0843   LYMPHSABS 0.8 (L) 10/14/2017 1052   MONOABS 0.3 12/06/2017 0843   MONOABS 0.4 10/14/2017 1052   EOSABS 0.1 12/06/2017 0843   EOSABS 0.3 10/14/2017 1052   BASOSABS 0.0 12/06/2017 0843   BASOSABS 0.0 10/14/2017 1052

## 2017-12-23 ENCOUNTER — Telehealth: Payer: Self-pay

## 2017-12-23 NOTE — Telephone Encounter (Signed)
Left a voice message of her upcoming appointment. Per 3/1 phone message return

## 2017-12-24 IMAGING — NM NM BONE WHOLE BODY
2 series · 2 of 2 positions shown · non-contrast
Comparison: None

CLINICAL DATA: LEFT breast cancer

EXAM:
NUCLEAR MEDICINE WHOLE BODY BONE SCAN
TECHNIQUE: Whole body anterior and posterior images were obtained approximately
3 hours after intravenous injection of radiopharmaceutical.
RADIOPHARMACEUTICALS:  21.4 mCi Nechnetium-HHm MDP IV

[Series 1: whole body · 2.66mm/px · 1 of 1 slices shown (1 of 2)]
[im 1/1]
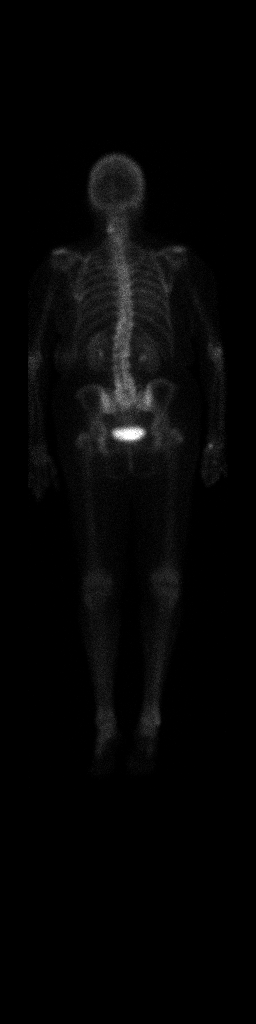

[Series 1: whole body · 2.66mm/px · 1 of 1 slices shown (2 of 2)]
[im 1/1]
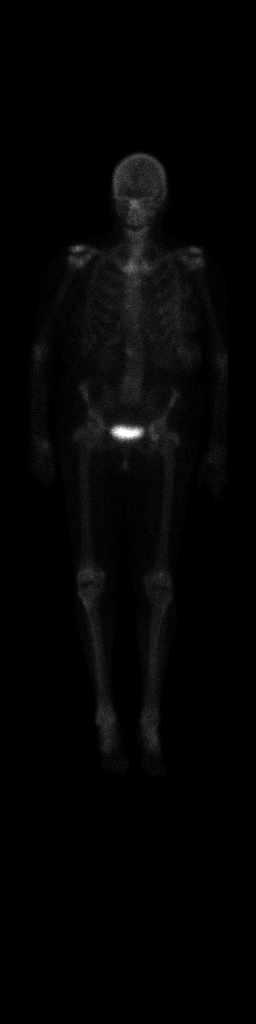

[2 of 2 positions shown; findings below may reference images not displayed]

Radiographic correlation: CT abdomen and pelvis 08/15/2017, chest
radiograph 07/27/2017
FINDINGS: Uptake at the LEFT lateral aspects of the cervical and lower
thoracic spine, typically degenerative.

Uptake at the shoulders, elbows, wrists, hips, and knees, typically
degenerative.

Asymmetric uptake at the sternoclavicular joints RIGHT greater than
LEFT which may also be degenerative.

No definite foci of abnormal osseous tracer accumulation are
identified which are suspicious for osseous metastases.

Biconvex thoracolumbar scoliosis.

Expected urinary tract and soft tissue distribution of tracer.
IMPRESSION: Scattered likely degenerative type uptake as above.

No definite scintigraphic evidence of osseous metastatic disease.

## 2017-12-24 IMAGING — CT CT ABD-PELV W/ CM
2 of 5 series · 16 of 46 positions shown, 18 images · IV contrast (ISOVUE 300)
Comparison: Chest CT 07/18/2017

CLINICAL DATA: Left breast cancer with recent mastectomy (June 2017). Chemotherapy and radiation therapy complete.

EXAM:
CT ABDOMEN AND PELVIS WITH CONTRAST
TECHNIQUE: Multidetector CT imaging of the abdomen and pelvis was performed
using the standard protocol following bolus administration of
intravenous contrast.
CONTRAST:  100 cc [id number]

[Series 2: axial st · axial · 0.74mm/px · z∈[-547,-212]mm · 13 of 79 slices shown, 15 images]
[im 6/79  soft-tissue]
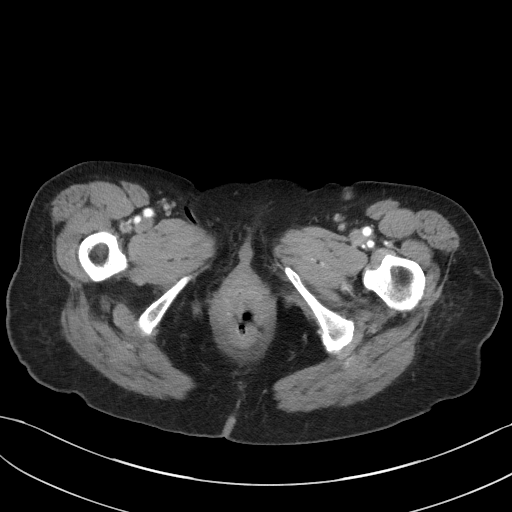
[im 6/79  bone]
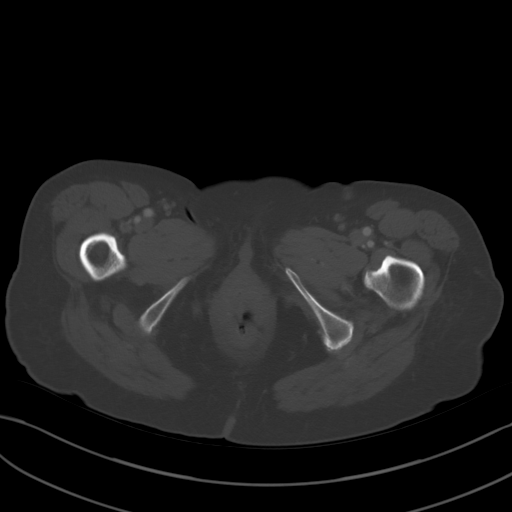
[im 11/79  soft-tissue]
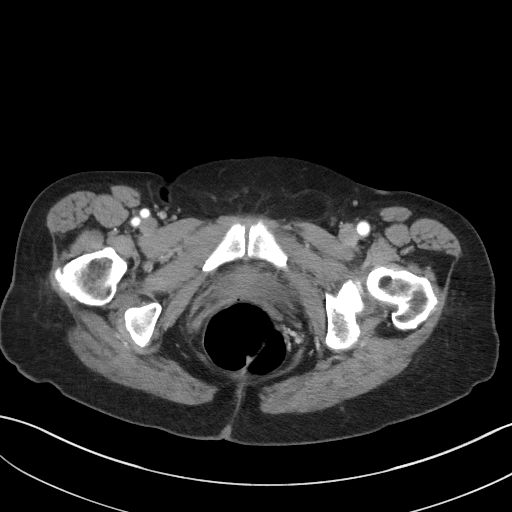
[im 16/79  soft-tissue]
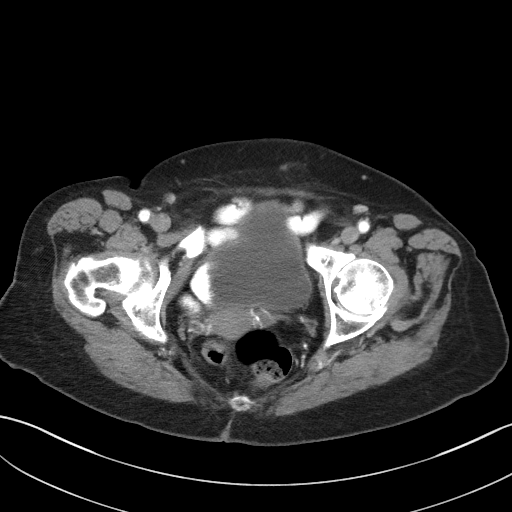
[im 21/79  soft-tissue]
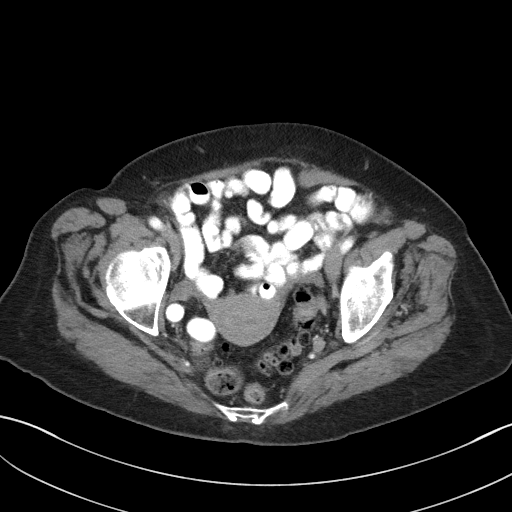
[im 27/79  soft-tissue]
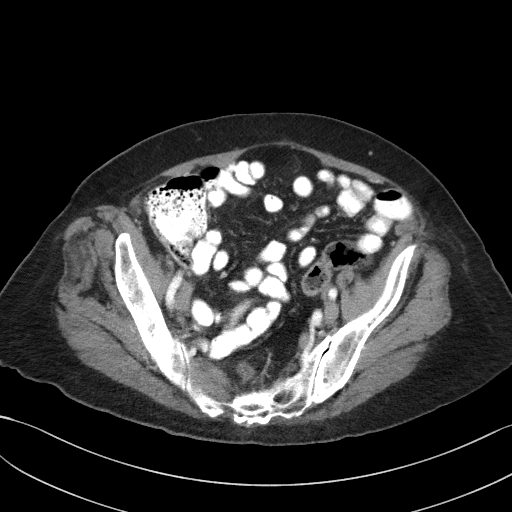
[im 32/79  soft-tissue]
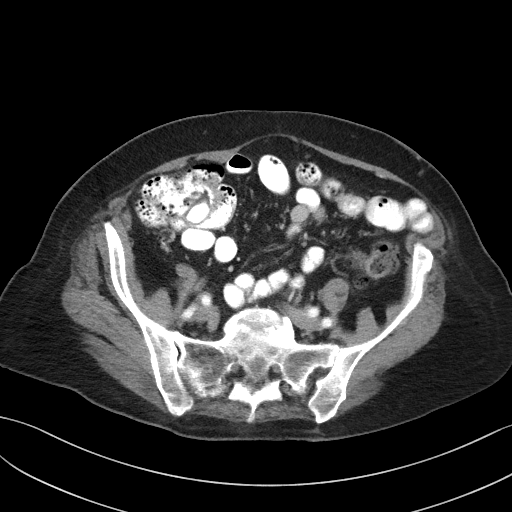
[im 42/79  soft-tissue]
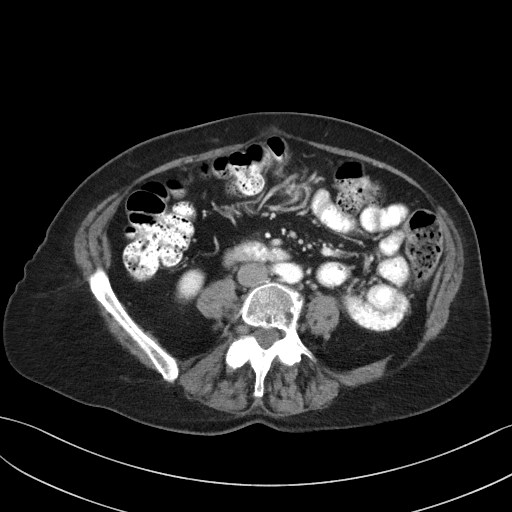
[im 47/79  soft-tissue]
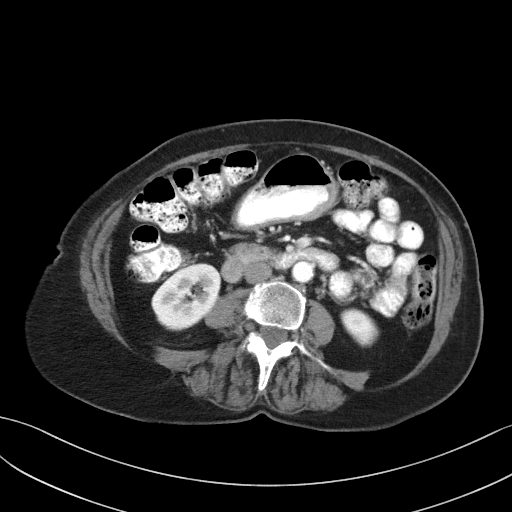
[im 53/79  soft-tissue]
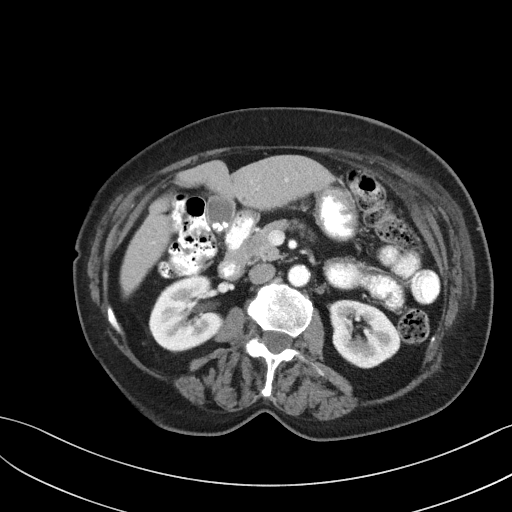
[im 53/79  bone]
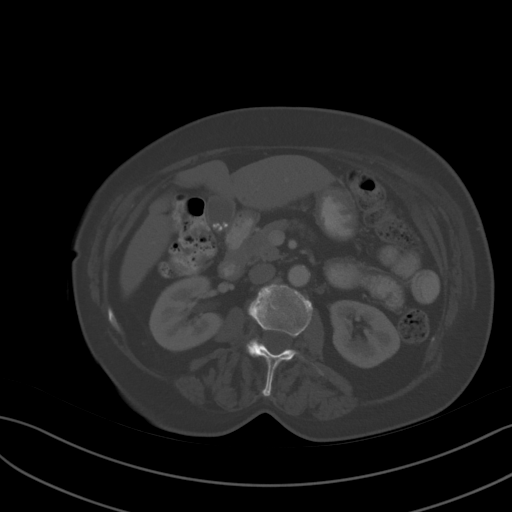
[im 58/79  soft-tissue]
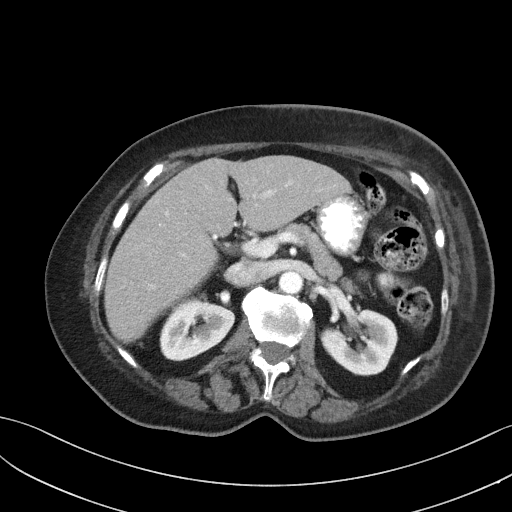
[im 63/79  soft-tissue]
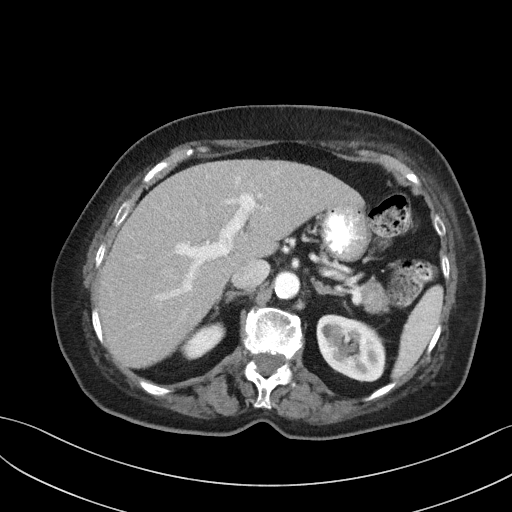
[im 68/79  soft-tissue]
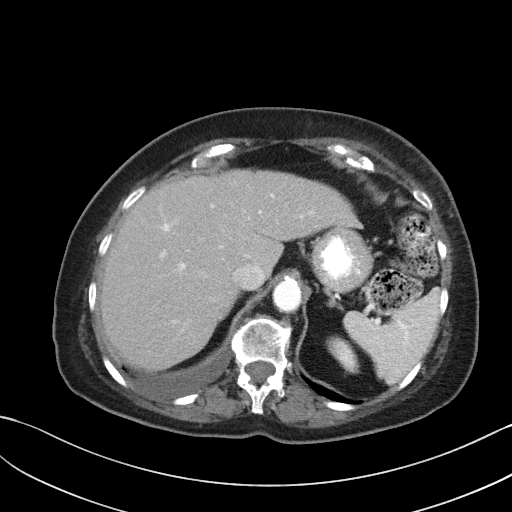
[im 73/79  soft-tissue]
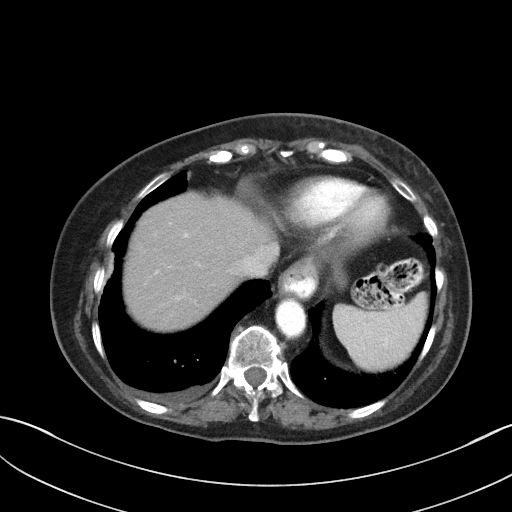

[Series 5: coronal st · coronal · 0.70mm/px · 3 of 85 slices shown]
[im 29/85  soft-tissue]
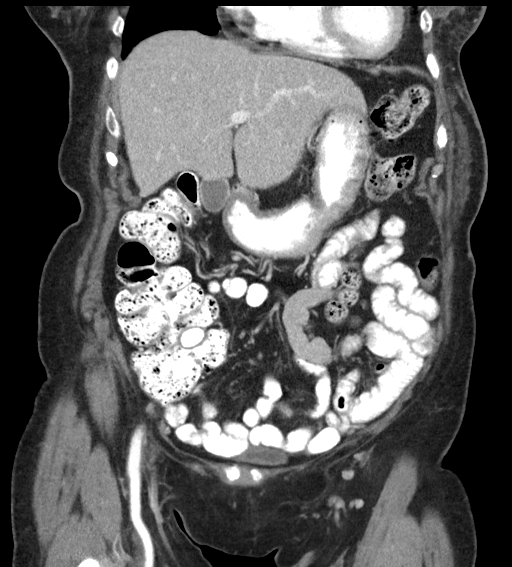
[im 38/85  soft-tissue]
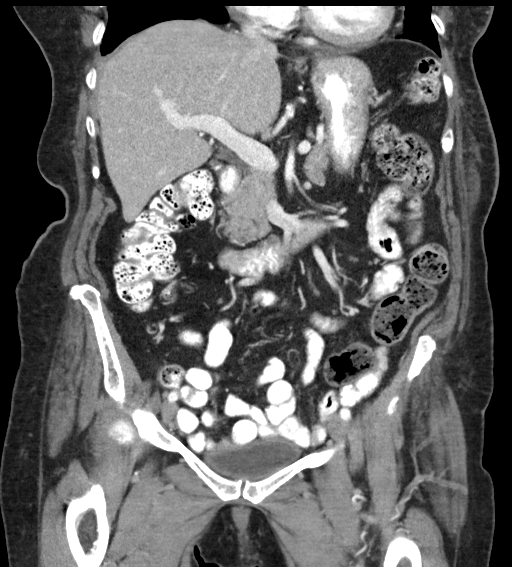
[im 47/85  soft-tissue]
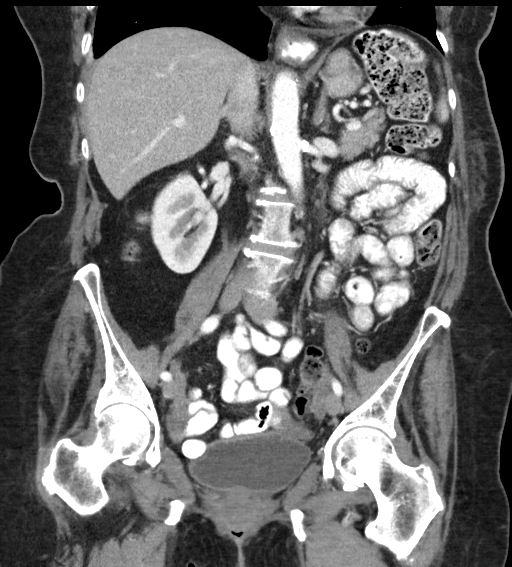

[16 of 46 positions shown; findings below may reference images not displayed]

FINDINGS: Lower chest: Small right pleural effusion with minimal overlying
atelectasis. The heart is normal in size. No pericardial effusion.
There is a large fluid collection in the left lower chest wall. This
could be the bottom aspect of the breast prosthesis or a
postoperative fluid collection. Recommend clinical correlation. Not
present on prior chest CT from June 2017.

No worrisome pulmonary lesions. Two sub 4 mm nodules noted just
above the right hemidiaphragm. These are stable.

Small hiatal hernia.

Hepatobiliary: No focal hepatic lesions or intrahepatic biliary
dilatation. Gallbladder demonstrates numerous layering gallstones.
No findings for acute cholecystitis. No common bile duct dilatation.

Pancreas: No mass, inflammation or ductal dilatation.

Spleen: Normal size.  No focal lesions.

Adrenals/Urinary Tract: The adrenal glands and kidneys are
unremarkable and stable.

Stomach/Bowel: The stomach, duodenum, small bowel and colon are
unremarkable. No acute inflammatory process, mass lesions or
obstructive findings. Small hiatal hernia is noted. The terminal
ileum is normal. Moderate sigmoid diverticulosis.

Vascular/Lymphatic: Scattered aortic calcifications but no aneurysm
or dissection. Dense renal artery ostial calcifications are noted
bilaterally.

No mesenteric or retroperitoneal mass or adenopathy.

Reproductive: The uterus and ovaries are unremarkable.

Other: No pelvic mass or adenopathy. No free pelvic fluid
collections. No inguinal mass or adenopathy. Small periumbilical
abdominal wall hernia containing fat.

Musculoskeletal: No significant bony findings. Advanced lower lumbar
facet disease and moderate degenerate disc disease at L5-S1.
IMPRESSION: 1. No CT findings to suggest metastatic disease involving the
abdomen/pelvis.
2. Left chest wall fluid collection as discussed above.
3. Small right pleural effusion with minimal overlying atelectasis.
4. Cholelithiasis.
5. Small periumbilical abdominal wall hernia containing fat.

## 2017-12-27 NOTE — Progress Notes (Signed)
[place]  [phone] [phone]  Clinic Follow Up Note   Patient Care Team: Beam, Russ Halo, MD as PCP - General (Family Medicine) [name], MD as Consulting Physician (General Surgery) [name], MD as Consulting Physician (Hematology) [name], MD as Consulting Physician (Radiation Oncology) 12/28/2017  CHIEF COMPLAINTS:  F/u breast cancer   Oncology History   Cancer Staging Malignant neoplasm of upper-outer quadrant of left breast in female, estrogen receptor positive ([place]) Staging form: Breast, AJCC 8th Edition - Clinical stage from 07/06/2017: Stage IA (cT1c, cN0, cM0, G3, ER: Positive, PR: Positive, HER2: Positive) - Unsigned Staging comments: Staged at breast conference 9.12.18 - Pathologic stage from 07/13/2017: Stage IA (pT2, pN0, cM0, G3, ER: Positive, PR: Positive, HER2: Positive) - Signed by [name], MD on 08/16/2017       Malignant neoplasm of upper-outer quadrant of left breast in female, estrogen receptor positive ([place])   06/23/2017 Mammogram    Diagnostic mammogram 06/23/17 IMPRESSION: Indeterminate mass with irregular margins at the 2:30 position 4 cm from nipple measuring 1.4 x 0.8 x 1.2 cm in the upper-outer left breast.      06/28/2017 Initial Biopsy    Diagnosis 06/28/17 Breast, left, needle core biopsy, upper outer quadrant, 2:30 o'clock, 4cm from nipple - INVASIVE MAMMARY CARCINOMA WITH CALCIFICATIONS, SEE COMMENT.      06/28/2017 Receptors her2    Estrogen Receptor: 100%, POSITIVE, STRONG STAINING INTENSITY Progesterone Receptor: 20%, POSITIVE, STRONG STAINING INTENSITY Proliferation Marker Ki67: 25% HER2: Postive       06/30/2017 Initial Diagnosis    Malignant neoplasm of upper-outer quadrant of left breast in female, estrogen receptor positive ([place])      07/13/2017 Surgery    Left mastectomy with SLN biopsy performed by Dr Marlou Starks.       07/13/2017 Pathology Results    Diagnosis  1. Breast, simple  mastectomy, Left - INVASIVE AND IN SITU DUCTAL CARCINOMA, 4 CM, MSBR GRADE 3. - MARGINS NOT INVOLVED. - SEE ONCOLOGY TABLE. 2. Lymph node, sentinel, biopsy, Left #1 - ONE BENIGN LYMPH NODE (0/1). 3. Lymph node, biopsy, Left #2 - ONE BENIGN LYMPH NODE (0/1).      07/18/2017 - 07/27/2017 Hospital Admission    Admit date: 07/18/17 Admission diagnosis: spontaneous PNX Additional comments: the patient was admitted with a pneumothorax several days after a port was placed although her post procedure cxr was normal. She had a chest tube placed but had a persistent air leak. The pleurivac was placed to 40 cm h2o suction for several days and the pneumothorax finally resolved.       08/15/2017 Imaging    CT A/P IMPRESSION: 1. No CT findings to suggest metastatic disease involving the abdomen/pelvis. 2. Left chest wall fluid collection as discussed above. 3. Small right pleural effusion with minimal overlying atelectasis. 4. Cholelithiasis. 5. Small periumbilical abdominal wall hernia containing fat.       08/15/2017 Imaging    NM Whole Body Bone Scan FINDINGS: Uptake at the LEFT lateral aspects of the cervical and lower thoracic spine, typically degenerative.  Uptake at the shoulders, elbows, wrists, hips, and knees, typically degenerative.  Asymmetric uptake at the sternoclavicular joints RIGHT greater than LEFT which may also be degenerative.  No definite foci of abnormal osseous tracer accumulation are identified which are suspicious for osseous metastases.  Biconvex thoracolumbar scoliosis.  Expected urinary tract and soft tissue distribution of tracer.  IMPRESSION: Scattered likely degenerative type uptake as above.  No definite scintigraphic  evidence of osseous metastatic disease.      08/16/2017 Genetic Testing    The patient had genetic testing due to a personal and family history of breast cancer.  The [employer] Multi-Cancer Panel was ordered. The Multi-Cancer  Panel offered by [facility] includes sequencing and/or deletion duplication testing of the following 83 genes: ALK, APC, ATM, AXIN2,BAP1,  BARD1, BLM, BMPR1A, BRCA1, BRCA2, BRIP1, CASR, CDC73, CDH1, CDK4, CDKN1B, CDKN1C, CDKN2A (p14ARF), CDKN2A (p16INK4a), CEBPA, CHEK2, CTNNA1, DICER1, DIS3L2, EGFR (c.2369C>T, p.Thr790Met variant only), EPCAM (Deletion/duplication testing only), FH, FLCN, GATA2, GPC3, GREM1 (Promoter region deletion/duplication testing only), HOXB13 (c.251G>A, p.Gly84Glu), HRAS, KIT, MAX, MEN1, MET, MITF (c.952G>A, p.Glu318Lys variant only), MLH1, MSH2, MSH3, MSH6, MUTYH, NBN, NF1, NF2, NTHL1, PALB2, PDGFRA, PHOX2B, PMS2, POLD1, POLE, POT1, PRKAR1A, PTCH1, PTEN, RAD50, RAD51C, RAD51D, RB1, RECQL4, RET, RUNX1, SDHAF2, SDHA (sequence changes only), SDHB, SDHC, SDHD, SMAD4, SMARCA4, SMARCB1, SMARCE1, STK11, SUFU, TERC, TERT, TMEM127, TP53, TSC1, TSC2, VHL, WRN and WT1.   Results: Negative, no pathogenic variants identified in the genes analyzed.  The date of this test report is 08/16/2017.         08/26/2017 -  Chemotherapy    adjuvant TCHP with Onpro q3weeks for 5 cycles started on 08/26/17-12/07/17 followed herceptin maintenance therapy every 3 weeks starting 12/28/17      08/31/2017 Breast US    On physical exam,there is a firm smooth mass in the low left axilla spanning at least 5 cm.  Ultrasound is performed, showing a complicated fluid collection which is predominantly anechoic measuring 5.2 x 3.9 x 3.5 cm. No blood flow was identified within the soft tissue component of the mass on color Doppler imaging. The appearance is most consistent with a postoperative seroma/resolving hematoma.  IMPRESSION: The complex fluid collection in the left axilla is very likely to represent a postoperative seroma/ resolving hematoma.  RECOMMENDATION: Three-month follow-up left axillary ultrasound is recommended.        HISTORY OF PRESENTING ILLNESS: 07/06/17 Kathleen Hanson 82 y.o.  female is here because of newly diagnosed Malignant neoplasm of upper-outer quadrant of left breast in female, estrogen receptor positive. She presents to Breast Clinic alone today.   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 Her sister had breast cancer when she was 64. Her father had bladder cancer due to smoking. She denies any other significant medical history. She smoked from [age] yo, less than a pack.   Today she reports to feeling her lump after her mammogram and she does not have any pain.  She denies change in weight, appetite or pain. She has been active with weight watchers and has purposefully lost weight.  She did not ask husband to attend but he would if she needed him to. She has two children, daughter and adopted son. She is retired, but previously a Pharmacist, hospital. 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 She wears hearing aids.    GYN HISTORY  Menarchal: 13-16 LMP: early 56s in 1992 Contraceptive: No HRT: No GP: G2P1A1, prior miscarriage, Has daughter and adopted son   CURRENT THERAPY:  [place] every 3 weeks started 08/26/17 completed 4 cycles on 12/07/17. Maintenance Herceptin every 3 weeks starting 12/28/17   INTERVAL HISTORY:   Kathleen Hanson is here for a follow up. She presents to the clinic today noting other treatment options for her. She note she has been using Miralax to help her ongoing  mild constipation for the past 10 years. She plans to stop this as Herceptin has been causing her some diarrhea. She notes she is not able to wear her compression socks all day and may have to change the size of them as they have become uncomfortable at time. She notes she prefers to move treatment to Fridays.   On review of symptoms, pt notes swelling of her feet and ankles. The redness of her skin has much improved.     MEDICAL HISTORY:  Past Medical History:  Diagnosis Date  . Cancer of left breast  ([place])   . Constipation   . Family history of breast cancer   . Fibrocystic breast   . Hyperlipidemia   . Ocular migraine    "used to have them monthly for a period of time; seemed to stop; now have had a couple in the last week" (07/13/2017)    SURGICAL HISTORY: Past Surgical History:  Procedure Laterality Date  . BREAST BIOPSY Right ~ 2001   BREAST EXCISIONAL BIOPSY  . BREAST LUMPECTOMY Left 2001   chemo and radiation  . CESAREAN SECTION  X 1  . IR CATHETER TUBE CHANGE  07/20/2017  . MASTECTOMY COMPLETE / SIMPLE W/ SENTINEL NODE BIOPSY Left 07/13/2017    LEFT MASTECTOMY WITH SENTINEL NODE MAPPING ERAS PATHWAY [name] 07/13/2017  . MASTECTOMY W/ SENTINEL NODE BIOPSY Left 07/13/2017   Procedure: LEFT MASTECTOMY WITH SENTINEL NODE MAPPING ERAS PATHWAY;  Surgeon: [name], MD;  Location: [facility];  Service: General;  Laterality: Left;  . PORTACATH PLACEMENT Right 07/13/2017  . PORTACATH PLACEMENT Right 07/13/2017   Procedure: INSERTION PORT-A-CATH;  Surgeon: [name], MD;  Location: [facility];  Service: General;  Laterality: Right;  . TONSILLECTOMY      SOCIAL HISTORY: Social History   Socioeconomic History  . Marital status: Married    Spouse name: Not on file  . Number of children: Not on file  . Years of education: Not on file  . Highest education level: Not on file  Social Needs  . Financial resource strain: Not on file  . Food insecurity - worry: Not on file  . Food insecurity - inability: Not on file  . Transportation needs - medical: Not on file  . Transportation needs - non-medical: Not on file  Occupational History  . Not on file  Tobacco Use  . Smoking status: Former Smoker    Packs/day: 0.50    Years: 15.00    Pack years: 7.50    Types: Cigarettes    Last attempt to quit: 1976    Years since quitting: 43.2  . Smokeless tobacco: Never Used  Substance and Sexual Activity  . Alcohol use: Yes    Alcohol/week: 3.0 oz    Types: 5 Glasses of wine per week  .  Drug use: No  . Sexual activity: Not Currently  Other Topics Concern  . Not on file  Social History Narrative  . Not on file    FAMILY HISTORY: Family History  Problem Relation Age of Onset  . Cancer Father        bladder cancer  . Breast cancer Sister 14    ALLERGIES:  has No Known Allergies.  MEDICATIONS:  Current Outpatient Medications  Medication Sig Dispense Refill  . amoxicillin-clavulanate (AUGMENTIN) 875-125 MG tablet Take 1 tablet by mouth 2 (two) times daily. (Patient not taking: Reported on 12/06/2017) 14 tablet 0  . aspirin 81 MG tablet Chew 81 mg by mouth  daily.     . cephALEXin (KEFLEX) 250 MG capsule Take 1 capsule (250 mg total) by mouth 3 (three) times daily. 21 capsule 0  . dexamethasone (DECADRON) 4 MG tablet Take 2 tablets (8 mg total) by mouth 2 (two) times daily. Start the day before Taxotere. Then again the day after chemo for 3 days. (Patient not taking: Reported on 12/06/2017) 30 tablet 1  . furosemide (LASIX) 20 MG tablet Take 1 tablet (20 mg total) by mouth daily. (Patient not taking: Reported on 12/14/2017) 10 tablet 0  . lidocaine-prilocaine (EMLA) cream Apply to affected area once 30 g 3  . Multiple Vitamin (MULTIVITAMIN WITH MINERALS) TABS tablet Take 1 tablet by mouth daily.    . ondansetron (ZOFRAN) 8 MG tablet Take 1 tablet (8 mg total) by mouth 2 (two) times daily as needed for refractory nausea / vomiting. Start on day 3 after chemo. (Patient not taking: Reported on 09/20/2017) 30 tablet 1  . polyethylene glycol powder (GLYCOLAX/MIRALAX) powder MIX 17 GRAMS IN LIQUID AND DRINK DAILY IN THE EVENING    . potassium chloride SA (K-DUR,KLOR-CON) 20 MEQ tablet Take 1 tablet (20 mEq total) by mouth 2 (two) times daily. (Patient not taking: Reported on 12/14/2017) 10 tablet 0  . prochlorperazine (COMPAZINE) 10 MG tablet Take 1 tablet (10 mg total) by mouth every 6 (six) hours as needed (Nausea or vomiting). (Patient not taking: Reported on 09/20/2017) 30 tablet  1  . simvastatin (ZOCOR) 40 MG tablet Take 40 mg daily by mouth.    . sulfamethoxazole-trimethoprim (BACTRIM DS,SEPTRA DS) 800-160 MG tablet Take 1 tablet by mouth 2 (two) times daily. 14 tablet 0  . vitamin C (ASCORBIC ACID) 500 MG tablet Take 500 mg by mouth daily.     No current facility-administered medications for this visit.     REVIEW OF SYSTEMS:   Constitutional: Denies fevers, chills or abnormal night sweats   Eyes: Denies blurriness of vision, double vision or watery eyes Ears, nose, mouth, throat, and face: Denies mucositis or sore throat (+) wears hearing aids Respiratory: Denies cough, dyspnea or wheezes Cardiovascular: Denies palpitation, chest discomfort or lower extremity swelling Gastrointestinal:  Denies nausea or change in bowel habits   Skin: (+) skin infection over left shin and right shin, resolved Lymphatics: Denies new lymphadenopathy or easy bruising Neurological:Denies numbness, tingling or new weaknesses Behavioral/Psych: Mood is stable, no new changes  All other systems were reviewed with the patient and are negative.  PHYSICAL EXAMINATION: ECOG PERFORMANCE STATUS:1  Vitals:   12/28/17 1205  BP: (!) 160/80  Pulse: 99  Resp: 19  Temp: 98.2 F (36.8 C)  SpO2: 98%   Filed Weights   12/28/17 1205  Weight: 143 lb 8 oz (65.1 kg)    GENERAL:alert, no distress and comfortable SKIN: No skin rash or lesions EYES: normal, conjunctiva are pink and non-injected, sclera clear OROPHARYNX:no exudate, no erythema and lips, buccal mucosa, and tongue normal  NECK: supple, thyroid normal size, non-tender, without nodularity LYMPH:  no palpable lymphadenopathy in the cervical, axillary or inguinal LUNGS: clear to auscultation and percussion with normal breathing effort HEART: regular rate & rhythm and no murmurs and no lower extremity edema ABDOMEN:abdomen soft, non-tender and normal bowel sounds Musculoskeletal:no cyanosis of digits and no clubbing  EXT: 1+  edema of left lower extremity up to the knee, trace edema in the right lower extremity below knee, no calf tenderness. PSYCH: alert & oriented x 3 with fluent speech NEURO: no focal motor/sensory deficits  BREAST: exam deferred today    LABORATORY DATA:  I have reviewed the data as listed CBC Latest Ref Rng & Units 12/28/2017 12/06/2017 11/25/2017  WBC 3.9 - 10.3 K/uL 6.7 6.1 3.5(L)  Hemoglobin 11.6 - 15.9 g/dL 13.0 12.1 10.8(L)  Hematocrit 34.8 - 46.6 % 39.4 37.3 32.5(L)  Platelets 145 - 400 K/uL 257 167 247    CMP Latest Ref Rng & Units 12/28/2017 12/06/2017 11/25/2017  Glucose 70 - 140 mg/dL 154(H) 113 81  BUN 7 - 26 mg/dL '16 11 12  ' Creatinine 0.60 - 1.10 mg/dL 0.64 0.66 0.51(L)  Sodium 136 - 145 mmol/L 138 136 139  Potassium 3.5 - 5.1 mmol/L 4.2 3.9 4.5  Chloride 98 - 109 mmol/L 103 101 105  CO2 22 - 29 mmol/L '23 23 27  ' Calcium 8.4 - 10.4 mg/dL 9.5 8.6 8.4  Total Protein 6.4 - 8.3 g/dL 6.9 6.3(L) 5.7(L)  Total Bilirubin 0.2 - 1.2 mg/dL 0.3 0.4 0.3  Alkaline Phos 40 - 150 U/L 100 95 85  AST 5 - 34 U/L '17 23 15  ' ALT 0 - 55 U/L '18 19 13   ' PATHOLOGY  Diagnosis 07/13/17 1. Breast, simple mastectomy, Left - INVASIVE AND IN SITU DUCTAL CARCINOMA, 4 CM, MSBR GRADE 3. - MARGINS NOT INVOLVED. - SEE ONCOLOGY TABLE. 2. Lymph node, sentinel, biopsy, Left #1 - ONE BENIGN LYMPH NODE (0/1). 3. Lymph node, biopsy, Left #2 - ONE BENIGN LYMPH NODE (0/1). Microscopic Comment 1. BREAST, INVASIVE TUMOR Procedure: Mastectomy and two sentinel lymph nodes Laterality: Left breast Tumor Size: 4.0 cm Histologic Type: Ductal Grade: 3 Tubular Differentiation: 3 Nuclear Pleomorphism: 2 Mitotic Count: 2 Ductal Carcinoma in Situ (DCIS): Present, intermediate grade Extent of Tumor: Skin: Free of tumor Nipple: Free of tumor Skeletal muscle: Free of tumor Margins: Free of tumor Invasive carcinoma, distance from closest margin: 2 cm from posterior margin DCIS, distance from closest margin: 2 cm from  posterior margin Regional Lymph Nodes: Number of Lymph Nodes Examined: 2 Number of Sentinel Lymph Nodes Examined: 2 Lymph Nodes with Macrometastases: 0 Lymph Nodes with Micrometastases: 0 1 of 3 FINAL for Kathleen Hanson, Kathleen Hanson ([id number]) Microscopic Comment(continued) Lymph Nodes with Isolated Tumor Cells: 0 Breast Prognostic Profile: Case number [id number] Estrogen Receptor: 100%, positive, strong staining Progesterone Receptor: 20%, positive, strong staining Her2: Equivocal by FISH, ratio 1.49 and average copy number 4.2. Positive by immunohistochemistry (3+) Ki-67: 25% Best tumor block for sendout testing: 1C Pathologic Stage Classification (pTNM, AJCC 8th Edition): Primary Tumor (pT): pT2 Regional Lymph Nodes (pN): pN0 Distant Metastases (pM): pMX  Diagnosis 06/28/17 Breast, left, needle core biopsy, upper outer quadrant, 2:30 o'clock, 4cm from nipple - INVASIVE MAMMARY CARCINOMA WITH CALCIFICATIONS, SEE COMMENT. Microscopic Comment The carcinoma appears grade 3. E-cadherin will be ordered and reported in an addendum. Prognostic markers will be ordered. Dr. Melina Copa has reviewed the case. The case was called to [facility] on 06/29/2017. FLUORESCENCE IN-SITU HYBRIDIZATION Results: HER2 - *EQUIVOCAL* (Her2 by IHC ordered) RATIO OF HER2/CEP17 SIGNALS 1.49 AVERAGE HER2 COPY NUMBER PER CELL 4.20 Reference Range: NEGATIVE HER2/CEP17 Ratio <2.0 and average HER2 copy number <4.0 EQUIVOCAL HER2/CEP17 Ratio <2.0 and average HER2 copy number >=4.0 and <6.0 POSITIVE HER2/CEP17 Ratio >=2.0 or <2.0 and average HER2 copy number >=6.0 PROGNOSTIC INDICATORS Results: IMMUNOHISTOCHEMICAL AND MORPHOMETRIC ANALYSIS PERFORMED MANUALLY Estrogen Receptor: 100%, POSITIVE, STRONG STAINING INTENSITY Progesterone Receptor: 20%, POSITIVE, STRONG STAINING INTENSITY Proliferation Marker Ki67: 25% 1 of 3 FINAL for Kathleen Hanson, Kathleen Hanson ([id number]) ADDITIONAL  INFORMATION:(continued) REFERENCE RANGE ESTROGEN RECEPTOR NEGATIVE 0% POSITIVE =>1% REFERENCE RANGE PROGESTERONE RECEPTOR NEGATIVE 0% POSITIVE =>1% All controls stained appropriately    RADIOGRAPHIC STUDIES: I have personally reviewed the radiological images as listed and agreed with the findings in the report.  DG Bone Density 10/11/17 ASSESSMENT: The BMD measured at AP Spine L1-L3 is 1.072 g/cm2 with a T-score of -0.9.   NM Whole Body Scan 08/15/17 FINDINGS: Uptake at the LEFT lateral aspects of the cervical and lower thoracic spine, typically degenerative. Uptake at the shoulders, elbows, wrists, hips, and knees, typically degenerative. Asymmetric uptake at the sternoclavicular joints RIGHT greater than LEFT which may also be degenerative. No definite foci of abnormal osseous tracer accumulation are identified which are suspicious for osseous metastases. Biconvex thoracolumbar scoliosis. Expected urinary tract and soft tissue distribution of tracer. IMPRESSION: Scattered likely degenerative type uptake as above. No definite scintigraphic evidence of osseous metastatic disease.  CT A/P10/22/18 IMPRESSION: 1. No CT findings to suggest metastatic disease involving the abdomen/pelvis. 2. Left chest wall fluid collection as discussed above. 3. Small right pleural effusion with minimal overlying atelectasis. 4. Cholelithiasis. 5. Small periumbilical abdominal wall hernia containing fat.   ASSESSMENT & PLAN:  Kathleen Hanson is a 82 y.o. caucasian female with a history of breast cancer, HLD, presented with screening discovered left breast cancer.  1. Malignant neoplasm of upper-outer quadrant of left breast in female, invasive ductal carcinoma, pT2N0M0, Stage: 1A, triple positive, Grade 3 -She underwent mastectomy on 07/13/17, I previously reviewed her surgical pathology results with patient in details. -Her primary tumor was 4 cm, grade 3, triple positive, surgical margins  were negative, 2 sentinel lymph nodes were negative. -We previously discussed her invasive ductal tumor is early stage, but the Biology is more aggressive, due to HER2 positive, grade 3. She has moderate to high risk of recurrence after surgical resection. -She started adjuvant TCH with onpro on 08/26/17, with moderate side effects and overall not feeling well.  -I previously addressed her concern for quality of life while on chemotherapy.  Due to her multiple side effects from chemo, her advanced age, I stopped her chemo after cycle 4. -I previously suggested peer support group to have someone to talk to about her treatment.  -We discussed that when her chemo is finished we will continue with just Herceptin for a total of 1 year.  -She will proceed with maintenance Herceptin every 3 weeks to complete 1 year (at least 6 months if she has side effects) treatment  -We discussed she is eligible for oral her2 antibody therapy pill, Nerlynx. I provided her with reading material on this drug for her to consider. She will not start this until she is completes her maintenance Herceptin. -Given she had radiation for her prior breast cancer she will forego radiation now.  -I will discuss and start adjuvant AI therapy at next visit.  -F/u in 3 weeks   2. Bilateral lower extremity edema, cellulitis in 11/2017 -She received a total of 3 course of antibiotics for her lower extremity cellulitis, is resolved now -Doppler of lower extremities were negative for DVT -She still has moderate edema in the left lower extremity, right lower extremity has much improved.  I encouraged her to continue wear compression stockings, and elevate her legs.  3. History of invasive and in situ ductal carcinoma of left breast, 10/26/1999, Estrogen receptor negative, stage 1, Grade 3 -Treated in [place]  -s/p Lumpectomy (sentinel node negative, 2-3 removed), chemo, radiation  4. Osteopenia -  03/18/2011, DEXA T =  -1.9 -03/27/2014, DEXA  T = -1.4  -I previously encouraged her to take calcium and vitamin D supplement -10/11/2017, DEXA T -0.9  5. Bone pain, heartburn, increased BM, fatigue, secondary to chemo treatment -she experienced bone pain from onpro. I previously suggested she take Claritin once daily for 5 days after chemo and no more than 336m of tylenol daily.  -I previously suggested she uses OTC Prilosec or Pepcid for heartburn, if not enough I can call in a prescription  -For her BM she can hold on her laxative and if her BM continue to increase she can take imodium -Encouraged her to consider nutritional supplement, and to be physically active during the chemo -improved now   PLAN: -Proceed with maintenance Herceptin today, continue every 3 weeks -Follow-up in 3 weeks, plan to start aromatase inhibitor on next visit     No orders of the defined types were placed in this encounter.   All questions were answered. The patient knows to call the clinic with any problems, questions or concerns. I spent 20 minutes counseling the patient face to face. The total time spent in the appointment was 25 minutes and more than 50% was on counseling.   This document serves as a record of services personally performed by YTruitt Merle MD. It was created on her behalf by AJoslyn Devon a trained medical scribe. The creation of this record is based on the scribe's personal observations and the provider's statements to them.    I have reviewed the above documentation for accuracy and completeness, and I agree with the above.     YTruitt Merle MD 12/28/2017

## 2017-12-28 ENCOUNTER — Telehealth: Payer: Self-pay | Admitting: Hematology

## 2017-12-28 ENCOUNTER — Inpatient Hospital Stay: Payer: Medicare HMO

## 2017-12-28 ENCOUNTER — Inpatient Hospital Stay: Payer: Medicare HMO | Attending: Hematology

## 2017-12-28 ENCOUNTER — Encounter: Payer: Self-pay | Admitting: Hematology

## 2017-12-28 ENCOUNTER — Inpatient Hospital Stay (HOSPITAL_BASED_OUTPATIENT_CLINIC_OR_DEPARTMENT_OTHER): Payer: Medicare HMO | Admitting: Hematology

## 2017-12-28 VITALS — BP 160/80 | HR 99 | Temp 98.2°F | Resp 19 | Ht 64.0 in | Wt 143.5 lb

## 2017-12-28 DIAGNOSIS — Z170 Estrogen receptor positive status [ER+]: Secondary | ICD-10-CM | POA: Diagnosis not present

## 2017-12-28 DIAGNOSIS — M8580 Other specified disorders of bone density and structure, unspecified site: Secondary | ICD-10-CM | POA: Diagnosis not present

## 2017-12-28 DIAGNOSIS — Z9012 Acquired absence of left breast and nipple: Secondary | ICD-10-CM | POA: Insufficient documentation

## 2017-12-28 DIAGNOSIS — Z803 Family history of malignant neoplasm of breast: Secondary | ICD-10-CM | POA: Insufficient documentation

## 2017-12-28 DIAGNOSIS — C50412 Malignant neoplasm of upper-outer quadrant of left female breast: Secondary | ICD-10-CM

## 2017-12-28 DIAGNOSIS — Z87891 Personal history of nicotine dependence: Secondary | ICD-10-CM | POA: Insufficient documentation

## 2017-12-28 DIAGNOSIS — Z5112 Encounter for antineoplastic immunotherapy: Secondary | ICD-10-CM | POA: Diagnosis not present

## 2017-12-28 DIAGNOSIS — M7989 Other specified soft tissue disorders: Secondary | ICD-10-CM | POA: Diagnosis not present

## 2017-12-28 DIAGNOSIS — Z86000 Personal history of in-situ neoplasm of breast: Secondary | ICD-10-CM | POA: Insufficient documentation

## 2017-12-28 LAB — COMPREHENSIVE METABOLIC PANEL
ALT: 18 U/L (ref 0–55)
ANION GAP: 12 — AB (ref 3–11)
AST: 17 U/L (ref 5–34)
Albumin: 3.8 g/dL (ref 3.5–5.0)
Alkaline Phosphatase: 100 U/L (ref 40–150)
BUN: 16 mg/dL (ref 7–26)
CALCIUM: 9.5 mg/dL (ref 8.4–10.4)
CHLORIDE: 103 mmol/L (ref 98–109)
CO2: 23 mmol/L (ref 22–29)
CREATININE: 0.64 mg/dL (ref 0.60–1.10)
Glucose, Bld: 154 mg/dL — ABNORMAL HIGH (ref 70–140)
Potassium: 4.2 mmol/L (ref 3.5–5.1)
Sodium: 138 mmol/L (ref 136–145)
Total Bilirubin: 0.3 mg/dL (ref 0.2–1.2)
Total Protein: 6.9 g/dL (ref 6.4–8.3)

## 2017-12-28 LAB — CBC WITH DIFFERENTIAL/PLATELET
BASOS PCT: 0 %
Basophils Absolute: 0 10*3/uL (ref 0.0–0.1)
Eosinophils Absolute: 0 10*3/uL (ref 0.0–0.5)
Eosinophils Relative: 0 %
HEMATOCRIT: 39.4 % (ref 34.8–46.6)
HEMOGLOBIN: 13 g/dL (ref 11.6–15.9)
Lymphocytes Relative: 6 %
Lymphs Abs: 0.4 10*3/uL — ABNORMAL LOW (ref 0.9–3.3)
MCH: 31 pg (ref 25.1–34.0)
MCHC: 33.1 g/dL (ref 31.5–36.0)
MCV: 93.5 fL (ref 79.5–101.0)
Monocytes Absolute: 0 10*3/uL — ABNORMAL LOW (ref 0.1–0.9)
Monocytes Relative: 1 %
NEUTROS ABS: 6.2 10*3/uL (ref 1.5–6.5)
NEUTROS PCT: 93 %
Platelets: 257 10*3/uL (ref 145–400)
RBC: 4.21 MIL/uL (ref 3.70–5.45)
RDW: 14 % (ref 11.2–14.5)
WBC: 6.7 10*3/uL (ref 3.9–10.3)

## 2017-12-28 MED ORDER — SODIUM CHLORIDE 0.9 % IV SOLN
Freq: Once | INTRAVENOUS | Status: AC
  Administered 2017-12-28: 14:00:00 via INTRAVENOUS

## 2017-12-28 MED ORDER — DIPHENHYDRAMINE HCL 25 MG PO CAPS
ORAL_CAPSULE | ORAL | Status: AC
  Filled 2017-12-28: qty 2

## 2017-12-28 MED ORDER — TRASTUZUMAB CHEMO 150 MG IV SOLR
6.0000 mg/kg | Freq: Once | INTRAVENOUS | Status: AC
  Administered 2017-12-28: 357 mg via INTRAVENOUS
  Filled 2017-12-28: qty 17

## 2017-12-28 MED ORDER — DIPHENHYDRAMINE HCL 25 MG PO CAPS
50.0000 mg | ORAL_CAPSULE | Freq: Once | ORAL | Status: AC
  Administered 2017-12-28: 50 mg via ORAL

## 2017-12-28 MED ORDER — ACETAMINOPHEN 325 MG PO TABS
ORAL_TABLET | ORAL | Status: AC
  Filled 2017-12-28: qty 2

## 2017-12-28 MED ORDER — HEPARIN SOD (PORK) LOCK FLUSH 100 UNIT/ML IV SOLN
500.0000 [IU] | Freq: Once | INTRAVENOUS | Status: AC | PRN
  Administered 2017-12-28: 500 [IU]
  Filled 2017-12-28: qty 5

## 2017-12-28 MED ORDER — SODIUM CHLORIDE 0.9% FLUSH
10.0000 mL | INTRAVENOUS | Status: DC | PRN
  Administered 2017-12-28: 10 mL
  Filled 2017-12-28: qty 10

## 2017-12-28 MED ORDER — ACETAMINOPHEN 325 MG PO TABS
650.0000 mg | ORAL_TABLET | Freq: Once | ORAL | Status: AC
  Administered 2017-12-28: 650 mg via ORAL

## 2017-12-28 MED ORDER — SODIUM CHLORIDE 0.9% FLUSH
10.0000 mL | Freq: Once | INTRAVENOUS | Status: AC
  Administered 2017-12-28: 10 mL
  Filled 2017-12-28: qty 10

## 2017-12-28 NOTE — Telephone Encounter (Signed)
Gave patient calendar with appt per 3/6 los.

## 2017-12-28 NOTE — Patient Instructions (Signed)
[facility] Discharge Instructions for Patients Receiving Chemotherapy  Today you received the following chemotherapy agents Herceptin  To help prevent nausea and vomiting after your treatment, we encourage you to take your nausea medication as directed   If you develop nausea and vomiting that is not controlled by your nausea medication, call the clinic.   BELOW ARE SYMPTOMS THAT SHOULD BE REPORTED IMMEDIATELY:  *FEVER GREATER THAN 100.5 F  *CHILLS WITH OR WITHOUT FEVER  NAUSEA AND VOMITING THAT IS NOT CONTROLLED WITH YOUR NAUSEA MEDICATION  *UNUSUAL SHORTNESS OF BREATH  *UNUSUAL BRUISING OR BLEEDING  TENDERNESS IN MOUTH AND THROAT WITH OR WITHOUT PRESENCE OF ULCERS  *URINARY PROBLEMS  *BOWEL PROBLEMS  UNUSUAL RASH Items with * indicate a potential emergency and should be followed up as soon as possible.  Feel free to call the clinic should you have any questions or concerns. The clinic phone number is [phone].  Please show the CHEMO ALERT CARD at check-in to the Emergency Department and triage nurse.   

## 2017-12-29 ENCOUNTER — Encounter: Payer: Self-pay | Admitting: Hematology

## 2017-12-30 ENCOUNTER — Telehealth: Payer: Self-pay | Admitting: *Deleted

## 2017-12-30 ENCOUNTER — Other Ambulatory Visit: Payer: Self-pay | Admitting: Nurse Practitioner

## 2017-12-30 ENCOUNTER — Encounter: Payer: Self-pay | Admitting: Nurse Practitioner

## 2017-12-30 NOTE — Telephone Encounter (Signed)
Called pt and left message on voice mail re:  Letter is ready for pt to pick up to take to her dentist on Monday.  Letter left at front desk for pt to pick up.

## 2017-12-30 NOTE — Telephone Encounter (Signed)
Done. Thanks, Lacie 

## 2017-12-30 NOTE — Telephone Encounter (Signed)
Kathleen Hanson,  Could you write the letter? She is on herceptin maintenance now, OK to have any dental procedure.   Thanks   [name] MD

## 2017-12-30 NOTE — Telephone Encounter (Signed)
Received call from [place] [facility], D[facility] requesting a letter of medical clearance for pt to have procedure done on Monday 3/11.   Called Crystal back for further information without answer.   Unable to leave message due to phone just ringing, and no voice mail. Crystal' s      Phone      [phone].

## 2018-01-19 ENCOUNTER — Other Ambulatory Visit: Payer: Self-pay | Admitting: *Deleted

## 2018-01-20 ENCOUNTER — Telehealth: Payer: Self-pay | Admitting: Hematology

## 2018-01-20 ENCOUNTER — Inpatient Hospital Stay: Payer: Medicare HMO

## 2018-01-20 ENCOUNTER — Encounter: Payer: Self-pay | Admitting: Hematology

## 2018-01-20 ENCOUNTER — Inpatient Hospital Stay (HOSPITAL_BASED_OUTPATIENT_CLINIC_OR_DEPARTMENT_OTHER): Payer: Medicare HMO | Admitting: Hematology

## 2018-01-20 VITALS — BP 160/68 | HR 72 | Temp 98.4°F | Resp 18 | Ht 64.0 in | Wt 145.4 lb

## 2018-01-20 DIAGNOSIS — C50412 Malignant neoplasm of upper-outer quadrant of left female breast: Secondary | ICD-10-CM

## 2018-01-20 DIAGNOSIS — Z5112 Encounter for antineoplastic immunotherapy: Secondary | ICD-10-CM | POA: Diagnosis not present

## 2018-01-20 DIAGNOSIS — Z170 Estrogen receptor positive status [ER+]: Secondary | ICD-10-CM | POA: Diagnosis not present

## 2018-01-20 DIAGNOSIS — M8580 Other specified disorders of bone density and structure, unspecified site: Secondary | ICD-10-CM | POA: Diagnosis not present

## 2018-01-20 DIAGNOSIS — Z9012 Acquired absence of left breast and nipple: Secondary | ICD-10-CM

## 2018-01-20 LAB — COMPREHENSIVE METABOLIC PANEL
ALK PHOS: 85 U/L (ref 40–150)
ALT: 23 U/L (ref 0–55)
ANION GAP: 7 (ref 3–11)
AST: 20 U/L (ref 5–34)
Albumin: 3.5 g/dL (ref 3.5–5.0)
BUN: 14 mg/dL (ref 7–26)
CALCIUM: 9.4 mg/dL (ref 8.4–10.4)
CO2: 27 mmol/L (ref 22–29)
CREATININE: 0.61 mg/dL (ref 0.60–1.10)
Chloride: 103 mmol/L (ref 98–109)
GFR calc Af Amer: >60 mL/min (ref >60)
GFR calc non Af Amer: >60 mL/min (ref >60)
GLUCOSE: 84 mg/dL (ref 70–140)
Potassium: 4.4 mmol/L (ref 3.5–5.1)
SODIUM: 137 mmol/L (ref 136–145)
Total Bilirubin: 0.3 mg/dL (ref 0.2–1.2)
Total Protein: 6.3 g/dL — ABNORMAL LOW (ref 6.4–8.3)

## 2018-01-20 LAB — CBC WITH DIFFERENTIAL/PLATELET
BASOS PCT: 0 %
Basophils Absolute: 0 10*3/uL (ref 0.0–0.1)
Eosinophils Absolute: 0.2 10*3/uL (ref 0.0–0.5)
Eosinophils Relative: 5 %
HEMATOCRIT: 40 % (ref 34.8–46.6)
Hemoglobin: 13.1 g/dL (ref 11.6–15.9)
Lymphocytes Relative: 25 %
Lymphs Abs: 0.9 10*3/uL (ref 0.9–3.3)
MCH: 31 pg (ref 25.1–34.0)
MCHC: 32.8 g/dL (ref 31.5–36.0)
MCV: 94.8 fL (ref 79.5–101.0)
MONOS PCT: 10 %
Monocytes Absolute: 0.4 10*3/uL (ref 0.1–0.9)
NEUTROS ABS: 2.1 10*3/uL (ref 1.5–6.5)
Neutrophils Relative %: 60 %
Platelets: 206 10*3/uL (ref 145–400)
RBC: 4.22 MIL/uL (ref 3.70–5.45)
RDW: 13.7 % (ref 11.2–14.5)
WBC: 3.6 10*3/uL — ABNORMAL LOW (ref 3.9–10.3)

## 2018-01-20 MED ORDER — TRASTUZUMAB CHEMO 150 MG IV SOLR
6.0000 mg/kg | Freq: Once | INTRAVENOUS | Status: AC
  Administered 2018-01-20: 357 mg via INTRAVENOUS
  Filled 2018-01-20: qty 17

## 2018-01-20 MED ORDER — SODIUM CHLORIDE 0.9% FLUSH
10.0000 mL | Freq: Once | INTRAVENOUS | Status: AC
  Administered 2018-01-20: 10 mL
  Filled 2018-01-20: qty 10

## 2018-01-20 MED ORDER — ACETAMINOPHEN 325 MG PO TABS
ORAL_TABLET | ORAL | Status: AC
  Filled 2018-01-20: qty 2

## 2018-01-20 MED ORDER — DIPHENHYDRAMINE HCL 25 MG PO CAPS
50.0000 mg | ORAL_CAPSULE | Freq: Once | ORAL | Status: AC
  Administered 2018-01-20: 50 mg via ORAL

## 2018-01-20 MED ORDER — ANASTROZOLE 1 MG PO TABS: 1.0000 mg | ORAL_TABLET | Freq: Every day | ORAL | 3 refills | Status: DC

## 2018-01-20 MED ORDER — DIPHENHYDRAMINE HCL 25 MG PO CAPS
ORAL_CAPSULE | ORAL | Status: AC
  Filled 2018-01-20: qty 2

## 2018-01-20 MED ORDER — ACETAMINOPHEN 325 MG PO TABS
650.0000 mg | ORAL_TABLET | Freq: Once | ORAL | Status: AC
  Administered 2018-01-20: 650 mg via ORAL

## 2018-01-20 MED ORDER — SODIUM CHLORIDE 0.9% FLUSH
10.0000 mL | INTRAVENOUS | Status: DC | PRN
  Administered 2018-01-20: 10 mL
  Filled 2018-01-20: qty 10

## 2018-01-20 MED ORDER — HEPARIN SOD (PORK) LOCK FLUSH 100 UNIT/ML IV SOLN
500.0000 [IU] | Freq: Once | INTRAVENOUS | Status: AC | PRN
  Administered 2018-01-20: 500 [IU]
  Filled 2018-01-20: qty 5

## 2018-01-20 MED ORDER — SODIUM CHLORIDE 0.9 % IV SOLN
Freq: Once | INTRAVENOUS | Status: AC
  Administered 2018-01-20: 12:00:00 via INTRAVENOUS

## 2018-01-20 NOTE — Patient Instructions (Signed)
[facility] Discharge Instructions for Patients Receiving Chemotherapy  Today you received the following chemotherapy agents Herceptin  To help prevent nausea and vomiting after your treatment, we encourage you to take your nausea medication as directed   If you develop nausea and vomiting that is not controlled by your nausea medication, call the clinic.   BELOW ARE SYMPTOMS THAT SHOULD BE REPORTED IMMEDIATELY:  *FEVER GREATER THAN 100.5 F  *CHILLS WITH OR WITHOUT FEVER  NAUSEA AND VOMITING THAT IS NOT CONTROLLED WITH YOUR NAUSEA MEDICATION  *UNUSUAL SHORTNESS OF BREATH  *UNUSUAL BRUISING OR BLEEDING  TENDERNESS IN MOUTH AND THROAT WITH OR WITHOUT PRESENCE OF ULCERS  *URINARY PROBLEMS  *BOWEL PROBLEMS  UNUSUAL RASH Items with * indicate a potential emergency and should be followed up as soon as possible.  Feel free to call the clinic should you have any questions or concerns. The clinic phone number is [phone].  Please show the CHEMO ALERT CARD at check-in to the Emergency Department and triage nurse.   

## 2018-01-20 NOTE — Telephone Encounter (Signed)
Appointments scheduled AVS/Calendar printed per 3/29 los °

## 2018-01-20 NOTE — Progress Notes (Signed)
[place]  [phone] [phone]  Clinic Follow Up Note   Patient Care Team: Beam, Russ Halo, MD as PCP - General (Family Medicine) [name], MD as Consulting Physician (General Surgery) [name], MD as Consulting Physician (Hematology) [name], MD as Consulting Physician (Radiation Oncology) 01/20/2018   CHIEF COMPLAINTS:  F/u breast cancer   Oncology History   Cancer Staging Malignant neoplasm of upper-outer quadrant of left breast in female, estrogen receptor positive ([place]) Staging form: Breast, AJCC 8th Edition - Clinical stage from 07/06/2017: Stage IA (cT1c, cN0, cM0, G3, ER: Positive, PR: Positive, HER2: Positive) - Unsigned Staging comments: Staged at breast conference 9.12.18 - Pathologic stage from 07/13/2017: Stage IA (pT2, pN0, cM0, G3, ER: Positive, PR: Positive, HER2: Positive) - Signed by [name], MD on 08/16/2017       Malignant neoplasm of upper-outer quadrant of left breast in female, estrogen receptor positive ([place])   06/23/2017 Mammogram    Diagnostic mammogram 06/23/17 IMPRESSION: Indeterminate mass with irregular margins at the 2:30 position 4 cm from nipple measuring 1.4 x 0.8 x 1.2 cm in the upper-outer left breast.      06/28/2017 Initial Biopsy    Diagnosis 06/28/17 Breast, left, needle core biopsy, upper outer quadrant, 2:30 o'clock, 4cm from nipple - INVASIVE MAMMARY CARCINOMA WITH CALCIFICATIONS, SEE COMMENT.      06/28/2017 Receptors her2    Estrogen Receptor: 100%, POSITIVE, STRONG STAINING INTENSITY Progesterone Receptor: 20%, POSITIVE, STRONG STAINING INTENSITY Proliferation Marker Ki67: 25% HER2: Postive       06/30/2017 Initial Diagnosis    Malignant neoplasm of upper-outer quadrant of left breast in female, estrogen receptor positive ([place])      07/13/2017 Surgery    Left mastectomy with SLN biopsy performed by Dr Marlou Starks.       07/13/2017 Pathology Results    Diagnosis  1. Breast, simple  mastectomy, Left - INVASIVE AND IN SITU DUCTAL CARCINOMA, 4 CM, MSBR GRADE 3. - MARGINS NOT INVOLVED. - SEE ONCOLOGY TABLE. 2. Lymph node, sentinel, biopsy, Left #1 - ONE BENIGN LYMPH NODE (0/1). 3. Lymph node, biopsy, Left #2 - ONE BENIGN LYMPH NODE (0/1).      07/18/2017 - 07/27/2017 Hospital Admission    Admit date: 07/18/17 Admission diagnosis: spontaneous PNX Additional comments: the patient was admitted with a pneumothorax several days after a port was placed although her post procedure cxr was normal. She had a chest tube placed but had a persistent air leak. The pleurivac was placed to 40 cm h2o suction for several days and the pneumothorax finally resolved.       08/15/2017 Imaging    CT A/P IMPRESSION: 1. No CT findings to suggest metastatic disease involving the abdomen/pelvis. 2. Left chest wall fluid collection as discussed above. 3. Small right pleural effusion with minimal overlying atelectasis. 4. Cholelithiasis. 5. Small periumbilical abdominal wall hernia containing fat.       08/15/2017 Imaging    NM Whole Body Bone Scan FINDINGS: Uptake at the LEFT lateral aspects of the cervical and lower thoracic spine, typically degenerative.  Uptake at the shoulders, elbows, wrists, hips, and knees, typically degenerative.  Asymmetric uptake at the sternoclavicular joints RIGHT greater than LEFT which may also be degenerative.  No definite foci of abnormal osseous tracer accumulation are identified which are suspicious for osseous metastases.  Biconvex thoracolumbar scoliosis.  Expected urinary tract and soft tissue distribution of tracer.  IMPRESSION: Scattered likely degenerative type uptake as above.  No definite  scintigraphic evidence of osseous metastatic disease.      08/16/2017 Genetic Testing    The patient had genetic testing due to a personal and family history of breast cancer.  The [employer] Multi-Cancer Panel was ordered. The Multi-Cancer  Panel offered by [facility] includes sequencing and/or deletion duplication testing of the following 83 genes: ALK, APC, ATM, AXIN2,BAP1,  BARD1, BLM, BMPR1A, BRCA1, BRCA2, BRIP1, CASR, CDC73, CDH1, CDK4, CDKN1B, CDKN1C, CDKN2A (p14ARF), CDKN2A (p16INK4a), CEBPA, CHEK2, CTNNA1, DICER1, DIS3L2, EGFR (c.2369C>T, p.Thr790Met variant only), EPCAM (Deletion/duplication testing only), FH, FLCN, GATA2, GPC3, GREM1 (Promoter region deletion/duplication testing only), HOXB13 (c.251G>A, p.Gly84Glu), HRAS, KIT, MAX, MEN1, MET, MITF (c.952G>A, p.Glu318Lys variant only), MLH1, MSH2, MSH3, MSH6, MUTYH, NBN, NF1, NF2, NTHL1, PALB2, PDGFRA, PHOX2B, PMS2, POLD1, POLE, POT1, PRKAR1A, PTCH1, PTEN, RAD50, RAD51C, RAD51D, RB1, RECQL4, RET, RUNX1, SDHAF2, SDHA (sequence changes only), SDHB, SDHC, SDHD, SMAD4, SMARCA4, SMARCB1, SMARCE1, STK11, SUFU, TERC, TERT, TMEM127, TP53, TSC1, TSC2, VHL, WRN and WT1.   Results: Negative, no pathogenic variants identified in the genes analyzed.  The date of this test report is 08/16/2017.         08/26/2017 - 11/04/2017 Chemotherapy    adjuvant TCHP with Onpro q3weeks for 4 cycles followed herceptin maintenance therapy every 3 weeks starting 12/28/17      08/31/2017 Breast US    On physical exam,there is a firm smooth mass in the low left axilla spanning at least 5 cm.  Ultrasound is performed, showing a complicated fluid collection which is predominantly anechoic measuring 5.2 x 3.9 x 3.5 cm. No blood flow was identified within the soft tissue component of the mass on color Doppler imaging. The appearance is most consistent with a postoperative seroma/resolving hematoma.  IMPRESSION: The complex fluid collection in the left axilla is very likely to represent a postoperative seroma/ resolving hematoma.  RECOMMENDATION: Three-month follow-up left axillary ultrasound is recommended.        HISTORY OF PRESENTING ILLNESS: 07/06/17 Kathleen Hanson 82 y.o. female is here  because of newly diagnosed Malignant neoplasm of upper-outer quadrant of left breast in female, estrogen receptor positive. She presents to Breast Clinic alone today.   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 Her sister had breast cancer when she was 70. Her father had bladder cancer due to smoking. She denies any other significant medical history. She smoked from [age] yo, less than a pack.   Today she reports to feeling her lump after her mammogram and she does not have any pain.  She denies change in weight, appetite or pain. She has been active with weight watchers and has purposefully lost weight.  She did not ask husband to attend but he would if she needed him to. She has two children, daughter and adopted son. She is retired, but previously a Pharmacist, hospital. 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 She wears hearing aids.    GYN HISTORY  Menarchal: [age] LMP: early 62s in 1992 Contraceptive: No HRT: No GP: G2P1A1, prior miscarriage, Has daughter and adopted son   CURRENT THERAPY: Maintenance Herceptin every 3 weeks starting 12/28/17. PENDING adjuvant Anastrozole daily   INTERVAL HISTORY:   XCARET MORAD is here for a follow up and herceptin maintenance therapy. She presents to the clinic today by herself. She reports she did well with Herceptin maintenance therapy. She also reports that her hair is starting to grow back. She  states she is having frequent bowel movements. She states she is having very mild right arm swelling  On review of systems, pt denies abdominal pain, or any other complaints at this time. Pertinent positives are listed and detailed within the above HPI.   MEDICAL HISTORY:  Past Medical History:  Diagnosis Date  . Cancer of left breast ([place])   . Constipation   . Family history of breast cancer   . Fibrocystic breast   . Hyperlipidemia   . Ocular migraine    "used to have  them monthly for a period of time; seemed to stop; now have had a couple in the last week" (07/13/2017)    SURGICAL HISTORY: Past Surgical History:  Procedure Laterality Date  . BREAST BIOPSY Right ~ 2001   BREAST EXCISIONAL BIOPSY  . BREAST LUMPECTOMY Left 2001   chemo and radiation  . CESAREAN SECTION  X 1  . IR CATHETER TUBE CHANGE  07/20/2017  . MASTECTOMY COMPLETE / SIMPLE W/ SENTINEL NODE BIOPSY Left 07/13/2017    LEFT MASTECTOMY WITH SENTINEL NODE MAPPING ERAS PATHWAY [name] 07/13/2017  . MASTECTOMY W/ SENTINEL NODE BIOPSY Left 07/13/2017   Procedure: LEFT MASTECTOMY WITH SENTINEL NODE MAPPING ERAS PATHWAY;  Surgeon: [name], MD;  Location: [place];  Service: General;  Laterality: Left;  . PORTACATH PLACEMENT Right 07/13/2017  . PORTACATH PLACEMENT Right 07/13/2017   Procedure: INSERTION PORT-A-CATH;  Surgeon: [name], MD;  Location: [place];  Service: General;  Laterality: Right;  . TONSILLECTOMY      SOCIAL HISTORY: Social History   Socioeconomic History  . Marital status: Married    Spouse name: Not on file  . Number of children: Not on file  . Years of education: Not on file  . Highest education level: Not on file  Occupational History  . Not on file  Social Needs  . Financial resource strain: Not on file  . Food insecurity:    Worry: Not on file    Inability: Not on file  . Transportation needs:    Medical: Not on file    Non-medical: Not on file  Tobacco Use  . Smoking status: Former Smoker    Packs/day: 0.50    Years: 15.00    Pack years: 7.50    Types: Cigarettes    Last attempt to quit: 1976    Years since quitting: 43.2  . Smokeless tobacco: Never Used  Substance and Sexual Activity  . Alcohol use: Yes    Alcohol/week: 3.0 oz    Types: 5 Glasses of wine per week  . Drug use: No  . Sexual activity: Not Currently  Lifestyle  . Physical activity:    Days per week: Not on file    Minutes per session: Not on file  . Stress: Not on file    Relationships  . Social connections:    Talks on phone: Not on file    Gets together: Not on file    Attends religious service: Not on file    Active member of club or organization: Not on file    Attends meetings of clubs or organizations: Not on file    Relationship status: Not on file  . Intimate partner violence:    Fear of current or ex partner: Not on file    Emotionally abused: Not on file    Physically abused: Not on file    Forced sexual activity: Not on file  Other Topics Concern  . Not on  file  Social History Narrative  . Not on file    FAMILY HISTORY: Family History  Problem Relation Age of Onset  . Cancer Father        bladder cancer  . Breast cancer Sister 12    ALLERGIES:  has No Known Allergies.  MEDICATIONS:  Current Outpatient Medications  Medication Sig Dispense Refill  . amoxicillin-clavulanate (AUGMENTIN) 875-125 MG tablet Take 1 tablet by mouth 2 (two) times daily. (Patient not taking: Reported on 12/06/2017) 14 tablet 0  . anastrozole (ARIMIDEX) 1 MG tablet Take 1 tablet (1 mg total) by mouth daily. 30 tablet 3  . aspirin 81 MG tablet Chew 81 mg by mouth daily.     . cephALEXin (KEFLEX) 250 MG capsule Take 1 capsule (250 mg total) by mouth 3 (three) times daily. 21 capsule 0  . dexamethasone (DECADRON) 4 MG tablet Take 2 tablets (8 mg total) by mouth 2 (two) times daily. Start the day before Taxotere. Then again the day after chemo for 3 days. (Patient not taking: Reported on 12/06/2017) 30 tablet 1  . furosemide (LASIX) 20 MG tablet Take 1 tablet (20 mg total) by mouth daily. (Patient not taking: Reported on 12/14/2017) 10 tablet 0  . lidocaine-prilocaine (EMLA) cream Apply to affected area once 30 g 3  . Multiple Vitamin (MULTIVITAMIN WITH MINERALS) TABS tablet Take 1 tablet by mouth daily.    . ondansetron (ZOFRAN) 8 MG tablet Take 1 tablet (8 mg total) by mouth 2 (two) times daily as needed for refractory nausea / vomiting. Start on day 3 after chemo.  (Patient not taking: Reported on 09/20/2017) 30 tablet 1  . polyethylene glycol powder (GLYCOLAX/MIRALAX) powder MIX 17 GRAMS IN LIQUID AND DRINK DAILY IN THE EVENING    . potassium chloride SA (K-DUR,KLOR-CON) 20 MEQ tablet Take 1 tablet (20 mEq total) by mouth 2 (two) times daily. (Patient not taking: Reported on 12/14/2017) 10 tablet 0  . prochlorperazine (COMPAZINE) 10 MG tablet Take 1 tablet (10 mg total) by mouth every 6 (six) hours as needed (Nausea or vomiting). (Patient not taking: Reported on 09/20/2017) 30 tablet 1  . simvastatin (ZOCOR) 40 MG tablet Take 40 mg daily by mouth.    . sulfamethoxazole-trimethoprim (BACTRIM DS,SEPTRA DS) 800-160 MG tablet Take 1 tablet by mouth 2 (two) times daily. 14 tablet 0  . vitamin C (ASCORBIC ACID) 500 MG tablet Take 500 mg by mouth daily.     No current facility-administered medications for this visit.    Facility-Administered Medications Ordered in Other Visits  Medication Dose Route Frequency Provider Last Rate Last Dose  . sodium chloride flush (NS) 0.9 % injection 10 mL  10 mL Intracatheter PRN [name], MD   10 mL at 01/20/18 1354    REVIEW OF SYSTEMS:   Constitutional: Denies fevers, chills or abnormal night sweats   Eyes: Denies blurriness of vision, double vision or watery eyes Ears, nose, mouth, throat, and face: Denies mucositis or sore throat (+) wears hearing aids Respiratory: Denies cough, dyspnea or wheezes Cardiovascular: Denies palpitation, chest discomfort or lower extremity swelling Gastrointestinal:  Denies nausea or change in bowel habits   Skin: Denies rashes Lymphatics: Denies new lymphadenopathy or easy bruising (+) very mild right arm swelling Neurological:Denies numbness, tingling or new weaknesses Behavioral/Psych: Mood is stable, no new changes  All other systems were reviewed with the patient and are negative.  PHYSICAL EXAMINATION: ECOG PERFORMANCE STATUS:1  Vitals:   01/20/18 1142  BP: (!) 160/68  Pulse:  72  Resp: 18  Temp: 98.4 F (36.9 C)  SpO2: 98%   Filed Weights   01/20/18 1142  Weight: 145 lb 6.4 oz (66 kg)    GENERAL:alert, no distress and comfortable SKIN: No skin rash or lesions EYES: normal, conjunctiva are pink and non-injected, sclera clear OROPHARYNX:no exudate, no erythema and lips, buccal mucosa, and tongue normal  NECK: supple, thyroid normal size, non-tender, without nodularity LYMPH:  no palpable lymphadenopathy in the cervical, axillary or inguinal LUNGS: clear to auscultation and percussion with normal breathing effort HEART: regular rate & rhythm and no murmurs and no lower extremity edema ABDOMEN:abdomen soft, non-tender and normal bowel sounds Musculoskeletal:no cyanosis of digits and no clubbing  EXT: 1+ edema of left lower extremity up to the knee, with mild calf tenderness,  trace edema in the right lower extremity below knee, no calf tenderness. PSYCH: alert & oriented x 3 with fluent speech NEURO: no focal motor/sensory deficits BREAST: exam deferred today    LABORATORY DATA:  I have reviewed the data as listed CBC Latest Ref Rng & Units 01/20/2018 12/28/2017 12/06/2017  WBC 3.9 - 10.3 K/uL 3.6(L) 6.7 6.1  Hemoglobin 11.6 - 15.9 g/dL 13.1 13.0 12.1  Hematocrit 34.8 - 46.6 % 40.0 39.4 37.3  Platelets 145 - 400 K/uL 206 257 167    CMP Latest Ref Rng & Units 01/20/2018 12/28/2017 12/06/2017  Glucose 70 - 140 mg/dL 84 154(H) 113  BUN 7 - 26 mg/dL _0 Creatinine 0.60 - 1.10 mg/dL 0.61 0.64 0.66  Sodium 136 - 145 mmol/L 137 138 136  Potassium 3.5 - 5.1 mmol/L 4.4 4.2 3.9  Chloride 98 - 109 mmol/L 103 103 101  CO2 22 - 29 mmol/L _1 Calcium 8.4 - 10.4 mg/dL 9.4 9.5 8.6  Total Protein 6.4 - 8.3 g/dL 6.3(L) 6.9 6.3(L)  Total Bilirubin 0.2 - 1.2 mg/dL 0.3 0.3 0.4  Alkaline Phos 40 - 150 U/L 85 100 95  AST 5 - 34 U/L _2 ALT 0 - 55 U/L _3 PATHOLOGY  Diagnosis 07/13/17 1. Breast, simple mastectomy, Left - INVASIVE AND IN SITU  DUCTAL CARCINOMA, 4 CM, MSBR GRADE 3. - MARGINS NOT INVOLVED. - SEE ONCOLOGY TABLE. 2. Lymph node, sentinel, biopsy, Left #1 - ONE BENIGN LYMPH NODE (0/1). 3. Lymph node, biopsy, Left #2 - ONE BENIGN LYMPH NODE (0/1). Microscopic Comment 1. BREAST, INVASIVE TUMOR Procedure: Mastectomy and two sentinel lymph nodes Laterality: Left breast Tumor Size: 4.0 cm Histologic Type: Ductal Grade: 3 Tubular Differentiation: 3 Nuclear Pleomorphism: 2 Mitotic Count: 2 Ductal Carcinoma in Situ (DCIS): Present, intermediate grade Extent of Tumor: Skin: Free of tumor Nipple: Free of tumor Skeletal muscle: Free of tumor Margins: Free of tumor Invasive carcinoma, distance from closest margin: 2 cm from posterior margin DCIS, distance from closest margin: 2 cm from posterior margin Regional Lymph Nodes: Number of Lymph Nodes Examined: 2 Number of Sentinel Lymph Nodes Examined: 2 Lymph Nodes with Macrometastases: 0 Lymph Nodes with Micrometastases: 0 1 of 3 FINAL for Kathleen Hanson, Kathleen Hanson ([id number]) Microscopic Comment(continued) Lymph Nodes with Isolated Tumor Cells: 0 Breast Prognostic Profile: Case number [id number] Estrogen Receptor: 100%, positive, strong staining Progesterone Receptor: 20%, positive, strong staining Her2: Equivocal by FISH, ratio 1.49 and average copy number 4.2. Positive by immunohistochemistry (3+) Ki-67: 25% Best tumor block for sendout testing: 1C Pathologic Stage Classification (pTNM, AJCC 8th Edition): Primary Tumor (pT): pT2 Regional Lymph Nodes (pN): pN0 Distant  Metastases (pM): pMX  Diagnosis 06/28/17 Breast, left, needle core biopsy, upper outer quadrant, 2:30 o'clock, 4cm from nipple - INVASIVE MAMMARY CARCINOMA WITH CALCIFICATIONS, SEE COMMENT. Microscopic Comment The carcinoma appears grade 3. E-cadherin will be ordered and reported in an addendum. Prognostic markers will be ordered. Dr. Melina Copa has reviewed the case. The case was called to [facility] on 06/29/2017. FLUORESCENCE IN-SITU HYBRIDIZATION Results: HER2 - *EQUIVOCAL* (Her2 by IHC ordered) RATIO OF HER2/CEP17 SIGNALS 1.49 AVERAGE HER2 COPY NUMBER PER CELL 4.20 Reference Range: NEGATIVE HER2/CEP17 Ratio <2.0 and average HER2 copy number <4.0 EQUIVOCAL HER2/CEP17 Ratio <2.0 and average HER2 copy number >=4.0 and <6.0 POSITIVE HER2/CEP17 Ratio >=2.0 or <2.0 and average HER2 copy number >=6.0 PROGNOSTIC INDICATORS Results: IMMUNOHISTOCHEMICAL AND MORPHOMETRIC ANALYSIS PERFORMED MANUALLY Estrogen Receptor: 100%, POSITIVE, STRONG STAINING INTENSITY Progesterone Receptor: 20%, POSITIVE, STRONG STAINING INTENSITY Proliferation Marker Ki67: 25% 1 of 3 FINAL for Kathleen Hanson, Kathleen Hanson [phone]) ADDITIONAL INFORMATION:(continued) REFERENCE RANGE ESTROGEN RECEPTOR NEGATIVE 0% POSITIVE =>1% REFERENCE RANGE PROGESTERONE RECEPTOR NEGATIVE 0% POSITIVE =>1% All controls stained appropriately    RADIOGRAPHIC STUDIES: I have personally reviewed the radiological images as listed and agreed with the findings in the report.  DG Bone Density 10/11/17 ASSESSMENT: The BMD measured at AP Spine L1-L3 is 1.072 g/cm2 with a T-score of -0.9.   NM Whole Body Scan 08/15/17 FINDINGS: Uptake at the LEFT lateral aspects of the cervical and lower thoracic spine, typically degenerative. Uptake at the shoulders, elbows, wrists, hips, and knees, typically degenerative. Asymmetric uptake at the sternoclavicular joints RIGHT greater than LEFT which may also be degenerative. No definite foci of abnormal osseous tracer accumulation are identified which are suspicious for osseous metastases. Biconvex thoracolumbar scoliosis. Expected urinary tract and soft tissue distribution of tracer. IMPRESSION: Scattered likely degenerative type uptake as above. No definite scintigraphic evidence of osseous metastatic disease.  CT A/P10/22/18 IMPRESSION: 1. No CT findings to suggest metastatic  disease involving the abdomen/pelvis. 2. Left chest wall fluid collection as discussed above. 3. Small right pleural effusion with minimal overlying atelectasis. 4. Cholelithiasis. 5. Small periumbilical abdominal wall hernia containing fat.   ASSESSMENT & PLAN:  Kathleen Hanson is a 82 y.o. caucasian female with a history of breast cancer, HLD, presented with screening discovered left breast cancer.  1. Malignant neoplasm of upper-outer quadrant of left breast in female, invasive ductal carcinoma, pT2N0M0, Stage: 1A, triple positive, Grade 3 -She underwent mastectomy on 07/13/17, I previously reviewed her surgical pathology results with patient in details. -Her primary tumor was 4 cm, grade 3, triple positive, surgical margins were negative, 2 sentinel lymph nodes were negative. -We previously discussed her invasive ductal tumor is early stage, but the Biology is more aggressive, due to HER2 positive, grade 3. She has moderate to high risk of recurrence after surgical resection. -She started adjuvant TCH with onpro on 08/26/17, with moderate side effects and overall not feeling well.  -I previously addressed her concern for quality of life while on chemotherapy.  Due to her multiple side effects from chemo, her advanced age, I stopped her chemo after cycle 4 on 11/04/17. -We will continue with just Herceptin for a total of 1 year, she is a standard recommendation.  Patient is reluctant to complete 1 year of therapy, and I asked if she can do 6 months.  We discussed that some data does show 6 months Herceptin is similar to 12 months therapy, however the chemo regiment contains Adriamycin, which is different from her chemo  regiment.  Again encouraged her to continue Herceptin maintenance for a 12 months.  -She will proceed with maintenance Herceptin every 3 weeks to complete 1 year (at least 6 months if she has side effects) treatment  -We previously discussed she is eligible for oral her2  antibody therapy pill, Nerlynx. I provided her with reading material on this drug for her to consider. She declined this therapy at this time.  -Given she had radiation for her prior breast cancer she will forego radiation now. -Pt has concerns today about completing 12 months of Herception maintenance therapy as opposed to 6 months. I advised her that 12 months is standard and would be my recommendation. We can always stop if she cannot tolerate. She will think about it.  -Given the strong ER and PR expression in her postmenopausal status, I recommend adjuvant endocrine therapy with aromatase inhibitor, Anastrozole, for a total of 5-10 years to reduce the risk of cancer recurrence. She had Tamoxifen previously with her other breast cancer and tolerated fine.  --The potential benefit and side effects, which includes but not limited to, hot flash, skin and vaginal dryness, metabolic changes ( increased blood glucose, cholesterol, weight, etc.), slightly in increased risk of cardiovascular disease, cataracts, muscular and joint discomfort, osteopenia and osteoporosis, etc, were discussed with her in great details. She is interested, and agrees to start next week  -F/u in 6 weeks   2. Bilateral lower extremity edema, L>R, cellulitis in 11/2017 -She received a total of 3 course of antibiotics for her lower extremity cellulitis, is resolved now -Doppler of lower extremities were negative for DVT -Improving. I encouraged her to continue wear compression stockings, and elevate her legs.  3. History of invasive and in situ ductal carcinoma of left breast, 10/26/1999, Estrogen receptor negative, stage 1, Grade 3 -Treated in [place]  -s/p Lumpectomy (sentinel node negative, 2-3 removed), chemo, radiation  4. Osteopenia - 03/18/2011, DEXA T = -1.9 -03/27/2014, DEXA  T = -1.4  -I previously encouraged her to take calcium and vitamin D supplement -10/11/2017, DEXA T -0.9 -I discussed that an aromatase  inhibitor can weaken her bone and we will monitor this    PLAN: -Proceed with maintenance Herceptin today, continue every 3 weeks -Start Anastrozole  -Follow-up in 6 weeks, 03/03/18  No orders of the defined types were placed in this encounter.  All questions were answered. The patient knows to call the clinic with any problems, questions or concerns. I spent 20 minutes counseling the patient face to face. The total time spent in the appointment was 25 minutes and more than 50% was on counseling.  This document serves as a record of services personally performed by [name], MD. It was created on her behalf by Theresia Bough, a trained medical scribe. The creation of this record is based on the scribe's personal observations and the provider's statements to them.   I have reviewed the above documentation for accuracy and completeness, and I agree with the above.     [name], MD 01/20/18 4:28 PM

## 2018-02-07 ENCOUNTER — Telehealth: Payer: Self-pay | Admitting: *Deleted

## 2018-02-07 NOTE — Telephone Encounter (Signed)
Thu, Please encourage her to keep 4/19 appointment and I will see her before infusion. I am available to see her at 8:15am, 9am, 1:15pm or 4pm on that day, please check if we can reschedule her infusion to be closer to her office visit.  Dr. Haroldine Laws, she is seeing you tomorrow. Could you discuss her leg swelling issue with her also? I don't think it's related to herceptin. We did doppler before and was negative for DVT.   [name] MD

## 2018-02-07 NOTE — Telephone Encounter (Signed)
Pt called requesting a call back from nurse.  Spoke with pt, and was informed that pt has concerns about continuing with Herceptin - since pt still has swelling in legs and feet.  Pt thinks Herceptin causes her legs swelling. Instructed pt to keep appt with Dr. Haroldine Laws and have repeated echo for 02/08/18.  Informed pt that Dr. Burr Medico will be notified by Dr. Haroldine Laws if any changes occur. Asked if pt has started Anastrozole as instructed by Dr. Burr Medico at last office visit.  Pt replied " NO ".  Stated she read about this med, and has some concerns that she would like to discuss further with Dr. Burr Medico.  Pt wanted to know if she should cancel Herceptin infusion for Friday  4/19. Informed pt that Dr. Burr Medico will be notified of pt's concerns.  Nurse will contact pt with further instructions from md. Pt's   Phone      [phone].

## 2018-02-08 ENCOUNTER — Ambulatory Visit (HOSPITAL_BASED_OUTPATIENT_CLINIC_OR_DEPARTMENT_OTHER)
Admission: RE | Admit: 2018-02-08 | Discharge: 2018-02-08 | Disposition: A | Discharge status: Home / Self Care | Payer: Medicare HMO | Source: Ambulatory Visit | Attending: Internal Medicine | Admitting: Internal Medicine

## 2018-02-08 ENCOUNTER — Other Ambulatory Visit: Payer: Self-pay

## 2018-02-08 ENCOUNTER — Ambulatory Visit (HOSPITAL_COMMUNITY)
Admission: RE | Admit: 2018-02-08 | Discharge: 2018-02-08 | Disposition: A | Discharge status: Home / Self Care | Payer: Medicare HMO | Source: Ambulatory Visit | Attending: Cardiology | Admitting: Cardiology

## 2018-02-08 VITALS — BP 165/62 | HR 65 | Wt 142.8 lb

## 2018-02-08 DIAGNOSIS — I110 Hypertensive heart disease with heart failure: Secondary | ICD-10-CM | POA: Diagnosis not present

## 2018-02-08 DIAGNOSIS — I5030 Unspecified diastolic (congestive) heart failure: Secondary | ICD-10-CM | POA: Diagnosis not present

## 2018-02-08 DIAGNOSIS — Z79899 Other long term (current) drug therapy: Secondary | ICD-10-CM | POA: Insufficient documentation

## 2018-02-08 DIAGNOSIS — M7989 Other specified soft tissue disorders: Secondary | ICD-10-CM | POA: Insufficient documentation

## 2018-02-08 DIAGNOSIS — M79605 Pain in left leg: Secondary | ICD-10-CM

## 2018-02-08 DIAGNOSIS — E785 Hyperlipidemia, unspecified: Secondary | ICD-10-CM | POA: Diagnosis not present

## 2018-02-08 DIAGNOSIS — Z170 Estrogen receptor positive status [ER+]: Secondary | ICD-10-CM | POA: Diagnosis not present

## 2018-02-08 DIAGNOSIS — Z87891 Personal history of nicotine dependence: Secondary | ICD-10-CM | POA: Insufficient documentation

## 2018-02-08 DIAGNOSIS — Z9012 Acquired absence of left breast and nipple: Secondary | ICD-10-CM | POA: Insufficient documentation

## 2018-02-08 DIAGNOSIS — C50412 Malignant neoplasm of upper-outer quadrant of left female breast: Secondary | ICD-10-CM | POA: Diagnosis not present

## 2018-02-08 DIAGNOSIS — Z5181 Encounter for therapeutic drug level monitoring: Secondary | ICD-10-CM | POA: Diagnosis not present

## 2018-02-08 DIAGNOSIS — I071 Rheumatic tricuspid insufficiency: Secondary | ICD-10-CM | POA: Insufficient documentation

## 2018-02-08 DIAGNOSIS — Z1501 Genetic susceptibility to malignant neoplasm of breast: Secondary | ICD-10-CM | POA: Diagnosis not present

## 2018-02-08 DIAGNOSIS — Z803 Family history of malignant neoplasm of breast: Secondary | ICD-10-CM | POA: Diagnosis not present

## 2018-02-08 DIAGNOSIS — M79604 Pain in right leg: Secondary | ICD-10-CM | POA: Diagnosis not present

## 2018-02-08 DIAGNOSIS — Z8052 Family history of malignant neoplasm of bladder: Secondary | ICD-10-CM | POA: Insufficient documentation

## 2018-02-08 NOTE — Progress Notes (Signed)
  Echocardiogram 2D Echocardiogram has been performed.  Kathleen Hanson 02/08/2018, 10:55 AM

## 2018-02-08 NOTE — Patient Instructions (Signed)
Please elevated your legs when possible  Please wear your compression hose daily, place them on as soon as you get up in the morning and remove before you go to bed at night.  Your physician has requested that you have a lower or upper extremity arterial duplex. This test is an ultrasound of the arteries in the legs or arms. It looks at arterial blood flow in the legs and arms. Allow one hour for Lower and Upper Arterial scans. There are no restrictions or special instructions  Your physician recommends that you schedule a follow-up appointment in: 3 months with echocardiogram

## 2018-02-08 NOTE — Progress Notes (Signed)
[place]  [phone] [phone]  Clinic Follow Up Note   Patient Care Team: Beam, Russ Halo, MD as PCP - General (Family Medicine) [name], MD as Consulting Physician (General Surgery) [name], MD as Consulting Physician (Hematology) [name], MD as Consulting Physician (Radiation Oncology) 02/10/2018   CHIEF COMPLAINTS:  F/u breast cancer   Oncology History   Cancer Staging Malignant neoplasm of upper-outer quadrant of left breast in female, estrogen receptor positive ([place]) Staging form: Breast, AJCC 8th Edition - Clinical stage from 07/06/2017: Stage IA (cT1c, cN0, cM0, G3, ER: Positive, PR: Positive, HER2: Positive) - Unsigned Staging comments: Staged at breast conference 9.12.18 - Pathologic stage from 07/13/2017: Stage IA (pT2, pN0, cM0, G3, ER: Positive, PR: Positive, HER2: Positive) - Signed by [name], MD on 08/16/2017       Malignant neoplasm of upper-outer quadrant of left breast in female, estrogen receptor positive ([place])   06/23/2017 Mammogram    Diagnostic mammogram 06/23/17 IMPRESSION: Indeterminate mass with irregular margins at the 2:30 position 4 cm from nipple measuring 1.4 x 0.8 x 1.2 cm in the upper-outer left breast.      06/28/2017 Initial Biopsy    Diagnosis 06/28/17 Breast, left, needle core biopsy, upper outer quadrant, 2:30 o'clock, 4cm from nipple - INVASIVE MAMMARY CARCINOMA WITH CALCIFICATIONS, SEE COMMENT.      06/28/2017 Receptors her2    Estrogen Receptor: 100%, POSITIVE, STRONG STAINING INTENSITY Progesterone Receptor: 20%, POSITIVE, STRONG STAINING INTENSITY Proliferation Marker Ki67: 25% HER2: Postive       06/30/2017 Initial Diagnosis    Malignant neoplasm of upper-outer quadrant of left breast in female, estrogen receptor positive ([place])      07/13/2017 Surgery    Left mastectomy with SLN biopsy performed by Dr Marlou Starks.       07/13/2017 Pathology Results    Diagnosis  1. Breast, simple  mastectomy, Left - INVASIVE AND IN SITU DUCTAL CARCINOMA, 4 CM, MSBR GRADE 3. - MARGINS NOT INVOLVED. - SEE ONCOLOGY TABLE. 2. Lymph node, sentinel, biopsy, Left #1 - ONE BENIGN LYMPH NODE (0/1). 3. Lymph node, biopsy, Left #2 - ONE BENIGN LYMPH NODE (0/1).      07/18/2017 - 07/27/2017 Hospital Admission    Admit date: 07/18/17 Admission diagnosis: spontaneous PNX Additional comments: the patient was admitted with a pneumothorax several days after a port was placed although her post procedure cxr was normal. She had a chest tube placed but had a persistent air leak. The pleurivac was placed to 40 cm h2o suction for several days and the pneumothorax finally resolved.       08/15/2017 Imaging    CT A/P IMPRESSION: 1. No CT findings to suggest metastatic disease involving the abdomen/pelvis. 2. Left chest wall fluid collection as discussed above. 3. Small right pleural effusion with minimal overlying atelectasis. 4. Cholelithiasis. 5. Small periumbilical abdominal wall hernia containing fat.       08/15/2017 Imaging    NM Whole Body Bone Scan FINDINGS: Uptake at the LEFT lateral aspects of the cervical and lower thoracic spine, typically degenerative.  Uptake at the shoulders, elbows, wrists, hips, and knees, typically degenerative.  Asymmetric uptake at the sternoclavicular joints RIGHT greater than LEFT which may also be degenerative.  No definite foci of abnormal osseous tracer accumulation are identified which are suspicious for osseous metastases.  Biconvex thoracolumbar scoliosis.  Expected urinary tract and soft tissue distribution of tracer.  IMPRESSION: Scattered likely degenerative type uptake as above.  No definite  scintigraphic evidence of osseous metastatic disease.      08/16/2017 Genetic Testing    The patient had genetic testing due to a personal and family history of breast cancer.  The [employer] Multi-Cancer Panel was ordered. The Multi-Cancer  Panel offered by [facility] includes sequencing and/or deletion duplication testing of the following 83 genes: ALK, APC, ATM, AXIN2,BAP1,  BARD1, BLM, BMPR1A, BRCA1, BRCA2, BRIP1, CASR, CDC73, CDH1, CDK4, CDKN1B, CDKN1C, CDKN2A (p14ARF), CDKN2A (p16INK4a), CEBPA, CHEK2, CTNNA1, DICER1, DIS3L2, EGFR (c.2369C>T, p.Thr790Met variant only), EPCAM (Deletion/duplication testing only), FH, FLCN, GATA2, GPC3, GREM1 (Promoter region deletion/duplication testing only), HOXB13 (c.251G>A, p.Gly84Glu), HRAS, KIT, MAX, MEN1, MET, MITF (c.952G>A, p.Glu318Lys variant only), MLH1, MSH2, MSH3, MSH6, MUTYH, NBN, NF1, NF2, NTHL1, PALB2, PDGFRA, PHOX2B, PMS2, POLD1, POLE, POT1, PRKAR1A, PTCH1, PTEN, RAD50, RAD51C, RAD51D, RB1, RECQL4, RET, RUNX1, SDHAF2, SDHA (sequence changes only), SDHB, SDHC, SDHD, SMAD4, SMARCA4, SMARCB1, SMARCE1, STK11, SUFU, TERC, TERT, TMEM127, TP53, TSC1, TSC2, VHL, WRN and WT1.   Results: Negative, no pathogenic variants identified in the genes analyzed.  The date of this test report is 08/16/2017.         08/26/2017 - 11/04/2017 Chemotherapy    adjuvant TCHP with Onpro q3weeks for 4 cycles followed herceptin maintenance therapy every 3 weeks starting 12/28/17      08/31/2017 Breast US    On physical exam,there is a firm smooth mass in the low left axilla spanning at least 5 cm.  Ultrasound is performed, showing a complicated fluid collection which is predominantly anechoic measuring 5.2 x 3.9 x 3.5 cm. No blood flow was identified within the soft tissue component of the mass on color Doppler imaging. The appearance is most consistent with a postoperative seroma/resolving hematoma.  IMPRESSION: The complex fluid collection in the left axilla is very likely to represent a postoperative seroma/ resolving hematoma.  RECOMMENDATION: Three-month follow-up left axillary ultrasound is recommended.        HISTORY OF PRESENTING ILLNESS: 07/06/17 Kathleen Hanson 82 y.o. female is here  because of newly diagnosed Malignant neoplasm of upper-outer quadrant of left breast in female, estrogen receptor positive. She presents to Breast Clinic alone today.   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 Her sister had breast cancer when she was 28. Her father had bladder cancer due to smoking. She denies any other significant medical history. She smoked from [age] yo, less than a pack.   Today she reports to feeling her lump after her mammogram and she does not have any pain.  She denies change in weight, appetite or pain. She has been active with weight watchers and has purposefully lost weight.  She did not ask husband to attend but he would if she needed him to. She has two children, daughter and adopted son. She is retired, but previously a Pharmacist, hospital. 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 She wears hearing aids.    GYN HISTORY  Menarchal: [age] LMP: early 19s in 1992 Contraceptive: No HRT: No GP: G2P1A1, prior miscarriage, Has daughter and adopted son   CURRENT THERAPY: Maintenance Herceptin every 3 weeks starting 12/28/17. PENDING adjuvant Anastrozole daily   INTERVAL HISTORY:   Kathleen Hanson is here for a follow up and herceptin maintenance therapy. She presents to the clinic today by herself. She reports she saw Dr. Haroldine Laws 2 days ago and had a normal Echo. She reports that he did not feel  that her leg swelling was not related to Herceptin. He ordered ABIs to evaluate the arterial flow.   On review of systems, pt denies any other complaints at this time. Pertinent positives are listed and detailed within the above HPI.   MEDICAL HISTORY:  Past Medical History:  Diagnosis Date  . Cancer of left breast ([place])   . Constipation   . Family history of breast cancer   . Fibrocystic breast   . Hyperlipidemia   . Ocular migraine    "used to have them monthly for a period of time; seemed  to stop; now have had a couple in the last week" (07/13/2017)    SURGICAL HISTORY: Past Surgical History:  Procedure Laterality Date  . BREAST BIOPSY Right ~ 2001   BREAST EXCISIONAL BIOPSY  . BREAST LUMPECTOMY Left 2001   chemo and radiation  . CESAREAN SECTION  X 1  . IR CATHETER TUBE CHANGE  07/20/2017  . MASTECTOMY COMPLETE / SIMPLE W/ SENTINEL NODE BIOPSY Left 07/13/2017    LEFT MASTECTOMY WITH SENTINEL NODE MAPPING ERAS PATHWAY [name] 07/13/2017  . MASTECTOMY W/ SENTINEL NODE BIOPSY Left 07/13/2017   Procedure: LEFT MASTECTOMY WITH SENTINEL NODE MAPPING ERAS PATHWAY;  Surgeon: [name], MD;  Location: [facility];  Service: General;  Laterality: Left;  . PORTACATH PLACEMENT Right 07/13/2017  . PORTACATH PLACEMENT Right 07/13/2017   Procedure: INSERTION PORT-A-CATH;  Surgeon: [name], MD;  Location: [facility];  Service: General;  Laterality: Right;  . TONSILLECTOMY      SOCIAL HISTORY: Social History   Socioeconomic History  . Marital status: Married    Spouse name: Not on file  . Number of children: Not on file  . Years of education: Not on file  . Highest education level: Not on file  Occupational History  . Not on file  Social Needs  . Financial resource strain: Not on file  . Food insecurity:    Worry: Not on file    Inability: Not on file  . Transportation needs:    Medical: Not on file    Non-medical: Not on file  Tobacco Use  . Smoking status: Former Smoker    Packs/day: 0.50    Years: 15.00    Pack years: 7.50    Types: Cigarettes    Last attempt to quit: 1976    Years since quitting: 43.3  . Smokeless tobacco: Never Used  Substance and Sexual Activity  . Alcohol use: Yes    Alcohol/week: 3.0 oz    Types: 5 Glasses of wine per week  . Drug use: No  . Sexual activity: Not Currently  Lifestyle  . Physical activity:    Days per week: Not on file    Minutes per session: Not on file  . Stress: Not on file  Relationships  . Social connections:     Talks on phone: Not on file    Gets together: Not on file    Attends religious service: Not on file    Active member of club or organization: Not on file    Attends meetings of clubs or organizations: Not on file    Relationship status: Not on file  . Intimate partner violence:    Fear of current or ex partner: Not on file    Emotionally abused: Not on file    Physically abused: Not on file    Forced sexual activity: Not on file  Other Topics Concern  . Not on file  Social  History Narrative  . Not on file    FAMILY HISTORY: Family History  Problem Relation Age of Onset  . Cancer Father        bladder cancer  . Breast cancer Sister 30    ALLERGIES:  has No Known Allergies.  MEDICATIONS:  Current Outpatient Medications  Medication Sig Dispense Refill  . fluticasone (FLONASE) 50 MCG/ACT nasal spray Place 2 sprays into the nose daily as needed.    . furosemide (LASIX) 20 MG tablet Take 1 tablet (20 mg total) by mouth daily. 10 tablet 0  . lidocaine-prilocaine (EMLA) cream Apply to affected area once 30 g 3  . Multiple Vitamin (MULTIVITAMIN WITH MINERALS) TABS tablet Take 1 tablet by mouth daily.    . polyethylene glycol powder (GLYCOLAX/MIRALAX) powder MIX 17 GRAMS IN LIQUID AND DRINK DAILY IN THE EVENING    . potassium chloride SA (K-DUR,KLOR-CON) 20 MEQ tablet Take 1 tablet (20 mEq total) by mouth 2 (two) times daily. (Patient taking differently: Take 20 mEq by mouth daily. ) 10 tablet 0  . simvastatin (ZOCOR) 40 MG tablet Take 40 mg daily by mouth.    . vitamin C (ASCORBIC ACID) 500 MG tablet Take 500 mg by mouth daily.    . ondansetron (ZOFRAN) 8 MG tablet Take 1 tablet (8 mg total) by mouth 2 (two) times daily as needed for refractory nausea / vomiting. Start on day 3 after chemo. (Patient not taking: Reported on 02/10/2018) 30 tablet 1  . prochlorperazine (COMPAZINE) 10 MG tablet Take 1 tablet (10 mg total) by mouth every 6 (six) hours as needed (Nausea or vomiting). (Patient  not taking: Reported on 02/10/2018) 30 tablet 1   No current facility-administered medications for this visit.    Facility-Administered Medications Ordered in Other Visits  Medication Dose Route Frequency Provider Last Rate Last Dose  . heparin lock flush 100 unit/mL  500 Units Intracatheter Once PRN [name], MD      . sodium chloride flush (NS) 0.9 % injection 10 mL  10 mL Intracatheter PRN [name], MD      . trastuzumab (HERCEPTIN) 357 mg in sodium chloride 0.9 % 250 mL chemo infusion  6 mg/kg (Treatment Plan Recorded) Intravenous Once [name], MD        REVIEW OF SYSTEMS:   Constitutional: Denies fevers, chills or abnormal night sweats   Eyes: Denies blurriness of vision, double vision or watery eyes Ears, nose, mouth, throat, and face: Denies mucositis or sore throat (+) wears hearing aids Respiratory: Denies cough, dyspnea or wheezes Cardiovascular: Denies palpitation, chest discomfort (+) left lower extremity swelling Gastrointestinal:  Denies nausea or change in bowel habits   Skin: Denies rashes Lymphatics: Denies new lymphadenopathy or easy bruising (+) very mild right arm swelling Neurological:Denies numbness, tingling or new weaknesses Behavioral/Psych: Mood is stable, no new changes  All other systems were reviewed with the patient and are negative.  PHYSICAL EXAMINATION: ECOG PERFORMANCE STATUS:1  Vitals:   02/10/18 0927  BP: 130/67  Pulse: 69  Resp: 20  Temp: 98.8 F (37.1 C)  SpO2: 98%   Filed Weights   02/10/18 0927  Weight: 144 lb 4.8 oz (65.5 kg)    GENERAL:alert, no distress and comfortable SKIN: No skin rash or lesions EYES: normal, conjunctiva are pink and non-injected, sclera clear OROPHARYNX:no exudate, no erythema and lips, buccal mucosa, and tongue normal  NECK: supple, thyroid normal size, non-tender, without nodularity LYMPH:  no palpable lymphadenopathy in the cervical, axillary or inguinal  LUNGS: clear to auscultation and percussion  with normal breathing effort HEART: regular rate & rhythm and no murmurs and no lower extremity edema ABDOMEN:abdomen soft, non-tender and normal bowel sounds Musculoskeletal:no cyanosis of digits and no clubbing  EXT: 1+ edema of left lower extremity up to the knee, with mild calf tenderness,  trace edema in the right lower extremity below knee, no calf tenderness. PSYCH: alert & oriented x 3 with fluent speech NEURO: no focal motor/sensory deficits BREAST: exam deferred today    LABORATORY DATA:  I have reviewed the data as listed CBC Latest Ref Rng & Units 01/20/2018 12/28/2017 12/06/2017  WBC 3.9 - 10.3 K/uL 3.6(L) 6.7 6.1  Hemoglobin 11.6 - 15.9 g/dL 13.1 13.0 12.1  Hematocrit 34.8 - 46.6 % 40.0 39.4 37.3  Platelets 145 - 400 K/uL 206 257 167    CMP Latest Ref Rng & Units 01/20/2018 12/28/2017 12/06/2017  Glucose 70 - 140 mg/dL 84 154(H) 113  BUN 7 - 26 mg/dL '14 16 11  ' Creatinine 0.60 - 1.10 mg/dL 0.61 0.64 0.66  Sodium 136 - 145 mmol/L 137 138 136  Potassium 3.5 - 5.1 mmol/L 4.4 4.2 3.9  Chloride 98 - 109 mmol/L 103 103 101  CO2 22 - 29 mmol/L '27 23 23  ' Calcium 8.4 - 10.4 mg/dL 9.4 9.5 8.6  Total Protein 6.4 - 8.3 g/dL 6.3(L) 6.9 6.3(L)  Total Bilirubin 0.2 - 1.2 mg/dL 0.3 0.3 0.4  Alkaline Phos 40 - 150 U/L 85 100 95  AST 5 - 34 U/L '20 17 23  ' ALT 0 - 55 U/L '23 18 19   ' PATHOLOGY  Diagnosis 07/13/17 1. Breast, simple mastectomy, Left - INVASIVE AND IN SITU DUCTAL CARCINOMA, 4 CM, MSBR GRADE 3. - MARGINS NOT INVOLVED. - SEE ONCOLOGY TABLE. 2. Lymph node, sentinel, biopsy, Left #1 - ONE BENIGN LYMPH NODE (0/1). 3. Lymph node, biopsy, Left #2 - ONE BENIGN LYMPH NODE (0/1). Microscopic Comment 1. BREAST, INVASIVE TUMOR Procedure: Mastectomy and two sentinel lymph nodes Laterality: Left breast Tumor Size: 4.0 cm Histologic Type: Ductal Grade: 3 Tubular Differentiation: 3 Nuclear Pleomorphism: 2 Mitotic Count: 2 Ductal Carcinoma in Situ (DCIS): Present, intermediate  grade Extent of Tumor: Skin: Free of tumor Nipple: Free of tumor Skeletal muscle: Free of tumor Margins: Free of tumor Invasive carcinoma, distance from closest margin: 2 cm from posterior margin DCIS, distance from closest margin: 2 cm from posterior margin Regional Lymph Nodes: Number of Lymph Nodes Examined: 2 Number of Sentinel Lymph Nodes Examined: 2 Lymph Nodes with Macrometastases: 0 Lymph Nodes with Micrometastases: 0 1 of 3 FINAL for Kathleen Hanson, Kathleen Hanson ([id number]) Microscopic Comment(continued) Lymph Nodes with Isolated Tumor Cells: 0 Breast Prognostic Profile: Case number [id number] Estrogen Receptor: 100%, positive, strong staining Progesterone Receptor: 20%, positive, strong staining Her2: Equivocal by FISH, ratio 1.49 and average copy number 4.2. Positive by immunohistochemistry (3+) Ki-67: 25% Best tumor block for sendout testing: 1C Pathologic Stage Classification (pTNM, AJCC 8th Edition): Primary Tumor (pT): pT2 Regional Lymph Nodes (pN): pN0 Distant Metastases (pM): pMX  Diagnosis 06/28/17 Breast, left, needle core biopsy, upper outer quadrant, 2:30 o'clock, 4cm from nipple - INVASIVE MAMMARY CARCINOMA WITH CALCIFICATIONS, SEE COMMENT. Microscopic Comment The carcinoma appears grade 3. E-cadherin will be ordered and reported in an addendum. Prognostic markers will be ordered. Dr. Melina Copa has reviewed the case. The case was called to [facility] on 06/29/2017. FLUORESCENCE IN-SITU HYBRIDIZATION Results: HER2 - *EQUIVOCAL* (Her2 by IHC ordered) RATIO OF HER2/CEP17 SIGNALS 1.49  AVERAGE HER2 COPY NUMBER PER CELL 4.20 Reference Range: NEGATIVE HER2/CEP17 Ratio <2.0 and average HER2 copy number <4.0 EQUIVOCAL HER2/CEP17 Ratio <2.0 and average HER2 copy number >=4.0 and <6.0 POSITIVE HER2/CEP17 Ratio >=2.0 or <2.0 and average HER2 copy number >=6.0 PROGNOSTIC INDICATORS Results: IMMUNOHISTOCHEMICAL AND MORPHOMETRIC ANALYSIS PERFORMED  MANUALLY Estrogen Receptor: 100%, POSITIVE, STRONG STAINING INTENSITY Progesterone Receptor: 20%, POSITIVE, STRONG STAINING INTENSITY Proliferation Marker Ki67: 25% 1 of 3 FINAL for Kathleen Hanson, Kathleen Hanson [phone]) ADDITIONAL INFORMATION:(continued) REFERENCE RANGE ESTROGEN RECEPTOR NEGATIVE 0% POSITIVE =>1% REFERENCE RANGE PROGESTERONE RECEPTOR NEGATIVE 0% POSITIVE =>1% All controls stained appropriately    RADIOGRAPHIC STUDIES: I have personally reviewed the radiological images as listed and agreed with the findings in the report.  DG Bone Density 10/11/17 ASSESSMENT: The BMD measured at AP Spine L1-L3 is 1.072 g/cm2 with a T-score of -0.9.   NM Whole Body Scan 08/15/17 FINDINGS: Uptake at the LEFT lateral aspects of the cervical and lower thoracic spine, typically degenerative. Uptake at the shoulders, elbows, wrists, hips, and knees, typically degenerative. Asymmetric uptake at the sternoclavicular joints RIGHT greater than LEFT which may also be degenerative. No definite foci of abnormal osseous tracer accumulation are identified which are suspicious for osseous metastases. Biconvex thoracolumbar scoliosis. Expected urinary tract and soft tissue distribution of tracer. IMPRESSION: Scattered likely degenerative type uptake as above. No definite scintigraphic evidence of osseous metastatic disease.  CT A/P10/22/18 IMPRESSION: 1. No CT findings to suggest metastatic disease involving the abdomen/pelvis. 2. Left chest wall fluid collection as discussed above. 3. Small right pleural effusion with minimal overlying atelectasis. 4. Cholelithiasis. 5. Small periumbilical abdominal wall hernia containing fat.   ASSESSMENT & PLAN:  Kathleen Hanson is a 82 y.o. caucasian female with a history of breast cancer, HLD, presented with screening discovered left breast cancer.  1. Malignant neoplasm of upper-outer quadrant of left breast in female, invasive ductal carcinoma,  pT2N0M0, Stage: 1A, triple positive, Grade 3 -She underwent mastectomy on 07/13/17, I previously reviewed her surgical pathology results with patient in details. -Her primary tumor was 4 cm, grade 3, triple positive, surgical margins were negative, 2 sentinel lymph nodes were negative. -We previously discussed her invasive ductal tumor is early stage, but the Biology is more aggressive, due to HER2 positive, grade 3. She has moderate to high risk of recurrence after surgical resection. -She started adjuvant TCH with onpro on 08/26/17, with moderate side effects and overall not feeling well.  -I previously addressed her concern for quality of life while on chemotherapy.  Due to her multiple side effects from chemo, her advanced age, I stopped her chemo after cycle 4 on 11/04/17. -We will continue with just Herceptin for a total of 1 year, she is a standard recommendation.  Patient is reluctant to complete 1 year of therapy, and I asked if she can do 6 months.  We discussed that some data does show 6 months Herceptin is similar to 12 months therapy, however the chemo regiment contains Adriamycin, which is different from her chemo regiment.  Again encouraged her to continue Herceptin maintenance for a 12 months.  -She will proceed with maintenance Herceptin every 3 weeks to complete 1 year (at least 6 months if she has side effects) treatment  -We previously discussed she is eligible for oral her2 antibody therapy pill, Nerlynx. I provided her with reading material on this drug for her to consider. She declined this therapy at that time.  -Given she had radiation for her prior breast cancer she  will forego radiation now. -Pt had concerns again about completing 12 months of Herception maintenance therapy as opposed to 6 months. I again advised her that 12 months is standard and would be my recommendation. We can always stop if she cannot tolerate. She agrees with continue herceptin for now, bu tstill reluctant  to complete a 12 months therapy  -Given the strong ER and PR expression in her postmenopausal status, I previously recommended adjuvant endocrine therapy with aromatase inhibitor, Anastrozole, for a total of 5-10 years to reduce the risk of cancer recurrence. She had Tamoxifen previously with her other breast cancer and tolerated fine.  -Pt has not started Anastrozole yet, she has concerns about side effects. I discussed the importance of this medication and its role in preventing cancer recurrence. I also discussed that her side effects may be more manageable because of her age and previous use of Tamoxifen that she tolerated well. I informed her that we can always switch her to a different medication if her side effects become unmanageable. She is interested, and agrees to start next week  -Labs reviewed today, adequate to proceed with Herceptin maintenance therapy today.  -F/u in 3 weeks with Lacie   2. Bilateral lower extremity edema, L>R, cellulitis in 11/2017 -She received a total of 3 course of antibiotics for her lower extremity cellulitis, is resolved now -Doppler of lower extremities were negative for DVT -Improving. I again encouraged her to continue wear compression stockings, and elevate her legs. -She saw Dr. Haroldine Laws on 02/08/18 and he does not think her swelling is related to HF or Herceptin. Her Echo was normal and he ordered ABIS to evaluate arterial flow.   3. History of invasive and in situ ductal carcinoma of left breast, 10/26/1999, Estrogen receptor negative, stage 1, Grade 3 -Treated in [place]  -s/p Lumpectomy (sentinel node negative, 2-3 removed), chemo, radiation  4. Osteopenia - 03/18/2011, DEXA T = -1.9 -03/27/2014, DEXA  T = -1.4  -I previously encouraged her to take calcium and vitamin D supplement -10/11/2017, DEXA T -0.9 -I previously discussed that an aromatase inhibitor can weaken her bone and we will monitor this    PLAN: -Proceed with maintenance  Herceptin today, continue every 3 weeks -Start Anastrozole  -Follow-up in 3 weeks with Lacie  No orders of the defined types were placed in this encounter.  All questions were answered. The patient knows to call the clinic with any problems, questions or concerns. I spent 20 minutes counseling the patient face to face. The total time spent in the appointment was 25 minutes and more than 50% was on counseling.  This document serves as a record of services personally performed by [name], MD. It was created on her behalf by Theresia Bough, a trained medical scribe. The creation of this record is based on the scribe's personal observations and the provider's statements to them.   I have reviewed the above documentation for accuracy and completeness, and I agree with the above.     [name], MD 02/10/18 10:33 AM

## 2018-02-08 NOTE — Telephone Encounter (Signed)
Spoke with pt and gave pt appts for Friday  02/10/18  -  Office visit with Dr. Burr Medico  0900 , and Herceptin infusion at  0945 am.  Pt voiced understanding.

## 2018-02-08 NOTE — Progress Notes (Signed)
CARDIO-ONCOLOGY CLINIC CONSULT NOTE  Referring Physician: Burr Medico   HPI:  [name] is 82 y.o. female with triple + left breast cancer referred by Dr. Burr Medico for enrollment into the Cardio-Oncology program.  She has h/o HL but no known heart disease. 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 Treated with adriamycin and XRT. Diagnosed with left breast CA in 9/18. Has undergone lest mastectomy.   Undergoing adjuvant TCHP with Onpro q3weeks for 6 cycles started on 08/26/17. Currently tolerating Herceptin well.  Presents today with multiple questions about her chemotherapy regimen and which medicines she can stop. Her main concern is swelling of LLE. Was recently treated for celluliits there and area remains a bit swollen. She read about Herceptin on the internet and feels it is related to that. Had LE u/s and negative for DVT. Had trauma to the area a while back buut says it was nothing severe. Used to wear compression hose but says they are too hard to get on/off. Denies claudication. No f/c, No HF symptoms. Says she remains fairly active.    Echo 1/19 EF 60-65% Grade I DD. LS ' 12.0 cm/s GLS inaccurate Personally reviewed Echo 02/08/18 EF 60% GLS -22.1% LS inaccurate      Oncology History   Cancer Staging Malignant neoplasm of upper-outer quadrant of left breast in female, estrogen receptor positive ([place]) Staging form: Breast, AJCC 8th Edition - Clinical stage from 07/06/2017: Stage IA (cT1c, cN0, cM0, G3, ER: Positive, PR: Positive, HER2: Positive) - Unsigned Staging comments: Staged at breast conference 9.12.18 - Pathologic stage from 07/13/2017: Stage IA (pT2, pN0, cM0, G3, ER: Positive, PR: Positive, HER2: Positive) - Signed by [name], MD on 08/16/2017       Malignant neoplasm of upper-outer quadrant of left breast in female, estrogen receptor positive ([place])   06/23/2017 Mammogram    Diagnostic mammogram  06/23/17 IMPRESSION: Indeterminate mass with irregular margins at the 2:30 position 4 cm from nipple measuring 1.4 x 0.8 x 1.2 cm in the upper-outer left breast.      06/28/2017 Initial Biopsy    Diagnosis 06/28/17 Breast, left, needle core biopsy, upper outer quadrant, 2:30 o'clock, 4cm from nipple - INVASIVE MAMMARY CARCINOMA WITH CALCIFICATIONS, SEE COMMENT.      06/28/2017 Receptors her2    Estrogen Receptor: 100%, POSITIVE, STRONG STAINING INTENSITY Progesterone Receptor: 20%, POSITIVE, STRONG STAINING INTENSITY Proliferation Marker Ki67: 25% HER2: Postive       06/30/2017 Initial Diagnosis    Malignant neoplasm of upper-outer quadrant of left breast in female, estrogen receptor positive ([place])      07/13/2017 Surgery    Left mastectomy with SLN biopsy performed by Dr Marlou Starks.       07/13/2017 Pathology Results    Diagnosis  1. Breast, simple mastectomy, Left - INVASIVE AND IN SITU DUCTAL CARCINOMA, 4 CM, MSBR GRADE 3. - MARGINS NOT INVOLVED. - SEE ONCOLOGY TABLE. 2. Lymph node, sentinel, biopsy, Left #1 - ONE BENIGN LYMPH NODE (0/1). 3. Lymph node, biopsy, Left #2 - ONE BENIGN LYMPH NODE (0/1).    08/26/2017 -  Chemotherapy    adjuvant TCHP with Onpro q3weeks for 6 cycles started on 08/26/17 followed herceptin maintenance therapy and anti-estrogen therapy      Past Medical History:  Diagnosis Date  . Cancer of left breast ([place])   . Constipation   . Family history of breast cancer   . Fibrocystic breast   . Hyperlipidemia   .  Ocular migraine    "used to have them monthly for a period of time; seemed to stop; now have had a couple in the last week" (07/13/2017)    Current Outpatient Medications  Medication Sig Dispense Refill  . furosemide (LASIX) 20 MG tablet Take 1 tablet (20 mg total) by mouth daily. 10 tablet 0  . lidocaine-prilocaine (EMLA) cream Apply to affected area once 30 g 3  . Multiple Vitamin (MULTIVITAMIN WITH MINERALS) TABS  tablet Take 1 tablet by mouth daily.    . ondansetron (ZOFRAN) 8 MG tablet Take 1 tablet (8 mg total) by mouth 2 (two) times daily as needed for refractory nausea / vomiting. Start on day 3 after chemo. 30 tablet 1  . polyethylene glycol powder (GLYCOLAX/MIRALAX) powder MIX 17 GRAMS IN LIQUID AND DRINK DAILY IN THE EVENING    . potassium chloride SA (K-DUR,KLOR-CON) 20 MEQ tablet Take 1 tablet (20 mEq total) by mouth 2 (two) times daily. (Patient taking differently: Take 20 mEq by mouth daily. ) 10 tablet 0  . prochlorperazine (COMPAZINE) 10 MG tablet Take 1 tablet (10 mg total) by mouth every 6 (six) hours as needed (Nausea or vomiting). 30 tablet 1  . simvastatin (ZOCOR) 40 MG tablet Take 40 mg daily by mouth.    . vitamin C (ASCORBIC ACID) 500 MG tablet Take 500 mg by mouth daily.     No current facility-administered medications for this encounter.     No Known Allergies    Social History   Socioeconomic History  . Marital status: Married    Spouse name: Not on file  . Number of children: Not on file  . Years of education: Not on file  . Highest education level: Not on file  Occupational History  . Not on file  Social Needs  . Financial resource strain: Not on file  . Food insecurity:    Worry: Not on file    Inability: Not on file  . Transportation needs:    Medical: Not on file    Non-medical: Not on file  Tobacco Use  . Smoking status: Former Smoker    Packs/day: 0.50    Years: 15.00    Pack years: 7.50    Types: Cigarettes    Last attempt to quit: 1976    Years since quitting: 43.3  . Smokeless tobacco: Never Used  Substance and Sexual Activity  . Alcohol use: Yes    Alcohol/week: 3.0 oz    Types: 5 Glasses of wine per week  . Drug use: No  . Sexual activity: Not Currently  Lifestyle  . Physical activity:    Days per week: Not on file    Minutes per session: Not on file  . Stress: Not on file  Relationships  . Social connections:    Talks on phone: Not on  file    Gets together: Not on file    Attends religious service: Not on file    Active member of club or organization: Not on file    Attends meetings of clubs or organizations: Not on file    Relationship status: Not on file  . Intimate partner violence:    Fear of current or ex partner: Not on file    Emotionally abused: Not on file    Physically abused: Not on file    Forced sexual activity: Not on file  Other Topics Concern  . Not on file  Social History Narrative  . Not on file  Family History  Problem Relation Age of Onset  . Cancer Father        bladder cancer  . Breast cancer Sister 104    Vitals:   02/08/18 1116  BP: (!) 165/62  Pulse: 65  SpO2: 100%  Weight: 142 lb 12.8 oz (64.8 kg)    PHYSICAL EXAM: General:  Well appearing. No resp difficulty HEENT: normal Neck: supple. no JVD. Carotids 2+ bilat; no bruits. No lymphadenopathy or thryomegaly appreciated. Cor: PMI nondisplaced. Regular rate & rhythm. No rubs, gallops or murmurs. R perm cath  Lungs: clear Abdomen: soft, nontender, nondistended. No hepatosplenomegaly. No bruits or masses. Good bowel sounds. Extremities: no cyanosis, clubbing, rash. Skin thickening of LLE c/w venous stasis. DP not palpable  Neuro: alert & orientedx3, cranial nerves grossly intact. moves all 4 extremities w/o difficulty. Affect pleasant    ASSESSMENT & PLAN: 1. Malignant neoplasm of upper-outer quadrant of left breast in female, invasive ductal carcinoma, pT2N0M0, Stage: 1A, triple positive, Grade 3 -She underwent mastectomy on 07/13/17 -She started adjuvant TCH with onpro on 08/26/17, with moderate side effects and overall not feeling well.  - I reviewed echos personally. EF and Doppler parameters stable. No HF on exam. Continue Herceptin. We discussed there results of there Persephone trial and I informed her that only low risk patients were advised to stop therapy at 6 months and that only Dr. Burr Medico would be able to help her  make this decision effectively  2. HTN - Says BPs are usually normal will follow with home cuff.  3. LLE swelling and redness - changes appear to be chronic and I suspect most likely venous stasis. Does not appear to have active cellulitis. U/s negative for DVT. It is not HF or related to Herceptin. Have recommended (and ordered) compression hose with a stocking donner. Will get ABIs to make sure arterial inflow is sufficient.     Kathleen Bickers, MD  11:31 AM

## 2018-02-10 ENCOUNTER — Inpatient Hospital Stay: Payer: Medicare HMO | Attending: Hematology

## 2018-02-10 ENCOUNTER — Inpatient Hospital Stay (HOSPITAL_BASED_OUTPATIENT_CLINIC_OR_DEPARTMENT_OTHER): Payer: Medicare HMO | Admitting: Hematology

## 2018-02-10 ENCOUNTER — Other Ambulatory Visit: Payer: Medicare HMO

## 2018-02-10 ENCOUNTER — Ambulatory Visit: Payer: Medicare HMO | Admitting: Hematology

## 2018-02-10 ENCOUNTER — Encounter: Payer: Self-pay | Admitting: Hematology

## 2018-02-10 ENCOUNTER — Telehealth: Payer: Self-pay | Admitting: Hematology

## 2018-02-10 VITALS — BP 130/67 | HR 69 | Temp 98.8°F | Resp 20 | Ht 64.0 in | Wt 144.3 lb

## 2018-02-10 DIAGNOSIS — M8580 Other specified disorders of bone density and structure, unspecified site: Secondary | ICD-10-CM | POA: Insufficient documentation

## 2018-02-10 DIAGNOSIS — Z170 Estrogen receptor positive status [ER+]: Secondary | ICD-10-CM | POA: Diagnosis not present

## 2018-02-10 DIAGNOSIS — Z87891 Personal history of nicotine dependence: Secondary | ICD-10-CM | POA: Diagnosis not present

## 2018-02-10 DIAGNOSIS — Z86000 Personal history of in-situ neoplasm of breast: Secondary | ICD-10-CM | POA: Diagnosis not present

## 2018-02-10 DIAGNOSIS — C50412 Malignant neoplasm of upper-outer quadrant of left female breast: Secondary | ICD-10-CM | POA: Diagnosis not present

## 2018-02-10 DIAGNOSIS — Z79810 Long term (current) use of selective estrogen receptor modulators (SERMs): Secondary | ICD-10-CM

## 2018-02-10 DIAGNOSIS — Z803 Family history of malignant neoplasm of breast: Secondary | ICD-10-CM

## 2018-02-10 DIAGNOSIS — Z79899 Other long term (current) drug therapy: Secondary | ICD-10-CM | POA: Insufficient documentation

## 2018-02-10 DIAGNOSIS — Z5112 Encounter for antineoplastic immunotherapy: Secondary | ICD-10-CM | POA: Diagnosis not present

## 2018-02-10 MED ORDER — TRASTUZUMAB CHEMO 150 MG IV SOLR
6.0000 mg/kg | Freq: Once | INTRAVENOUS | Status: AC
  Administered 2018-02-10: 357 mg via INTRAVENOUS
  Filled 2018-02-10: qty 17

## 2018-02-10 MED ORDER — ACETAMINOPHEN 325 MG PO TABS
650.0000 mg | ORAL_TABLET | Freq: Once | ORAL | Status: AC
  Administered 2018-02-10: 650 mg via ORAL

## 2018-02-10 MED ORDER — HEPARIN SOD (PORK) LOCK FLUSH 100 UNIT/ML IV SOLN
500.0000 [IU] | Freq: Once | INTRAVENOUS | Status: AC | PRN
  Administered 2018-02-10: 500 [IU]
  Filled 2018-02-10: qty 5

## 2018-02-10 MED ORDER — ACETAMINOPHEN 325 MG PO TABS
ORAL_TABLET | ORAL | Status: AC
  Filled 2018-02-10: qty 2

## 2018-02-10 MED ORDER — DIPHENHYDRAMINE HCL 25 MG PO CAPS
ORAL_CAPSULE | ORAL | Status: AC
  Filled 2018-02-10: qty 2

## 2018-02-10 MED ORDER — DIPHENHYDRAMINE HCL 25 MG PO CAPS
50.0000 mg | ORAL_CAPSULE | Freq: Once | ORAL | Status: AC
  Administered 2018-02-10: 50 mg via ORAL

## 2018-02-10 MED ORDER — SODIUM CHLORIDE 0.9% FLUSH
10.0000 mL | INTRAVENOUS | Status: DC | PRN
  Administered 2018-02-10: 10 mL
  Filled 2018-02-10: qty 10

## 2018-02-10 MED ORDER — SODIUM CHLORIDE 0.9 % IV SOLN
Freq: Once | INTRAVENOUS | Status: AC
  Administered 2018-02-10: 10:00:00 via INTRAVENOUS

## 2018-02-10 NOTE — Patient Instructions (Signed)
[facility] Discharge Instructions for Patients Receiving Chemotherapy  Today you received the following chemotherapy agents Herceptin  To help prevent nausea and vomiting after your treatment, we encourage you to take your nausea medication as directed   If you develop nausea and vomiting that is not controlled by your nausea medication, call the clinic.   BELOW ARE SYMPTOMS THAT SHOULD BE REPORTED IMMEDIATELY:  *FEVER GREATER THAN 100.5 F  *CHILLS WITH OR WITHOUT FEVER  NAUSEA AND VOMITING THAT IS NOT CONTROLLED WITH YOUR NAUSEA MEDICATION  *UNUSUAL SHORTNESS OF BREATH  *UNUSUAL BRUISING OR BLEEDING  TENDERNESS IN MOUTH AND THROAT WITH OR WITHOUT PRESENCE OF ULCERS  *URINARY PROBLEMS  *BOWEL PROBLEMS  UNUSUAL RASH Items with * indicate a potential emergency and should be followed up as soon as possible.  Feel free to call the clinic should you have any questions or concerns. The clinic phone number is [phone].  Please show the CHEMO ALERT CARD at check-in to the Emergency Department and triage nurse.   

## 2018-02-10 NOTE — Telephone Encounter (Signed)
No los 4/19 

## 2018-02-14 ENCOUNTER — Other Ambulatory Visit: Payer: Self-pay | Admitting: Internal Medicine

## 2018-02-14 DIAGNOSIS — M79604 Pain in right leg: Secondary | ICD-10-CM

## 2018-02-14 DIAGNOSIS — M79605 Pain in left leg: Principal | ICD-10-CM

## 2018-02-16 ENCOUNTER — Ambulatory Visit (HOSPITAL_COMMUNITY)
Admission: RE | Admit: 2018-02-16 | Discharge: 2018-02-16 | Disposition: A | Discharge status: Home / Self Care | Payer: Medicare HMO | Source: Ambulatory Visit | Attending: Cardiology | Admitting: Cardiology

## 2018-02-16 ENCOUNTER — Encounter (HOSPITAL_COMMUNITY): Payer: Self-pay

## 2018-02-16 DIAGNOSIS — M79604 Pain in right leg: Secondary | ICD-10-CM

## 2018-02-16 DIAGNOSIS — M79605 Pain in left leg: Principal | ICD-10-CM

## 2018-02-23 ENCOUNTER — Other Ambulatory Visit: Payer: Self-pay | Admitting: Internal Medicine

## 2018-02-23 ENCOUNTER — Other Ambulatory Visit (HOSPITAL_COMMUNITY): Payer: Self-pay | Admitting: Internal Medicine

## 2018-02-23 DIAGNOSIS — M79604 Pain in right leg: Secondary | ICD-10-CM

## 2018-02-23 DIAGNOSIS — M79605 Pain in left leg: Principal | ICD-10-CM

## 2018-02-23 DIAGNOSIS — R0989 Other specified symptoms and signs involving the circulatory and respiratory systems: Secondary | ICD-10-CM

## 2018-03-01 ENCOUNTER — Ambulatory Visit (HOSPITAL_COMMUNITY)
Admission: RE | Admit: 2018-03-01 | Discharge: 2018-03-01 | Disposition: A | Discharge status: Home / Self Care | Payer: Medicare HMO | Source: Ambulatory Visit | Attending: Cardiology | Admitting: Cardiology

## 2018-03-01 DIAGNOSIS — R0989 Other specified symptoms and signs involving the circulatory and respiratory systems: Secondary | ICD-10-CM | POA: Insufficient documentation

## 2018-03-01 DIAGNOSIS — Z87891 Personal history of nicotine dependence: Secondary | ICD-10-CM | POA: Insufficient documentation

## 2018-03-01 DIAGNOSIS — M79605 Pain in left leg: Secondary | ICD-10-CM | POA: Diagnosis present

## 2018-03-01 DIAGNOSIS — E785 Hyperlipidemia, unspecified: Secondary | ICD-10-CM | POA: Diagnosis not present

## 2018-03-01 DIAGNOSIS — M79604 Pain in right leg: Secondary | ICD-10-CM

## 2018-03-03 ENCOUNTER — Telehealth: Payer: Self-pay | Admitting: Hematology

## 2018-03-03 ENCOUNTER — Inpatient Hospital Stay: Payer: Medicare HMO | Attending: Hematology

## 2018-03-03 ENCOUNTER — Encounter (HOSPITAL_COMMUNITY): Payer: Medicare HMO

## 2018-03-03 ENCOUNTER — Inpatient Hospital Stay (HOSPITAL_BASED_OUTPATIENT_CLINIC_OR_DEPARTMENT_OTHER): Payer: Medicare HMO | Admitting: Nurse Practitioner

## 2018-03-03 ENCOUNTER — Inpatient Hospital Stay: Payer: Medicare HMO

## 2018-03-03 ENCOUNTER — Encounter: Payer: Self-pay | Admitting: Nurse Practitioner

## 2018-03-03 VITALS — BP 144/57 | HR 64 | Temp 98.0°F | Resp 18 | Ht 64.0 in | Wt 143.1 lb

## 2018-03-03 DIAGNOSIS — Z923 Personal history of irradiation: Secondary | ICD-10-CM | POA: Diagnosis not present

## 2018-03-03 DIAGNOSIS — Z170 Estrogen receptor positive status [ER+]: Principal | ICD-10-CM

## 2018-03-03 DIAGNOSIS — Z79899 Other long term (current) drug therapy: Secondary | ICD-10-CM

## 2018-03-03 DIAGNOSIS — C50412 Malignant neoplasm of upper-outer quadrant of left female breast: Secondary | ICD-10-CM | POA: Diagnosis not present

## 2018-03-03 DIAGNOSIS — Z79811 Long term (current) use of aromatase inhibitors: Secondary | ICD-10-CM | POA: Diagnosis not present

## 2018-03-03 DIAGNOSIS — R600 Localized edema: Secondary | ICD-10-CM | POA: Diagnosis not present

## 2018-03-03 DIAGNOSIS — M859 Disorder of bone density and structure, unspecified: Secondary | ICD-10-CM | POA: Diagnosis not present

## 2018-03-03 DIAGNOSIS — M7989 Other specified soft tissue disorders: Secondary | ICD-10-CM

## 2018-03-03 DIAGNOSIS — Z5112 Encounter for antineoplastic immunotherapy: Secondary | ICD-10-CM | POA: Diagnosis present

## 2018-03-03 DIAGNOSIS — Z803 Family history of malignant neoplasm of breast: Secondary | ICD-10-CM | POA: Diagnosis not present

## 2018-03-03 DIAGNOSIS — Z87891 Personal history of nicotine dependence: Secondary | ICD-10-CM | POA: Insufficient documentation

## 2018-03-03 DIAGNOSIS — Z9012 Acquired absence of left breast and nipple: Secondary | ICD-10-CM | POA: Insufficient documentation

## 2018-03-03 LAB — COMPREHENSIVE METABOLIC PANEL
ALK PHOS: 95 U/L (ref 40–150)
ALT: 22 U/L (ref 0–55)
ANION GAP: 8 (ref 3–11)
AST: 22 U/L (ref 5–34)
Albumin: 3.9 g/dL (ref 3.5–5.0)
BILIRUBIN TOTAL: 0.4 mg/dL (ref 0.2–1.2)
BUN: 16 mg/dL (ref 7–26)
CO2: 26 mmol/L (ref 22–29)
Calcium: 9 mg/dL (ref 8.4–10.4)
Chloride: 105 mmol/L (ref 98–109)
Creatinine, Ser: 0.61 mg/dL (ref 0.60–1.10)
GFR calc Af Amer: >60 mL/min (ref >60)
Glucose, Bld: 82 mg/dL (ref 70–140)
Potassium: 4.1 mmol/L (ref 3.5–5.1)
SODIUM: 139 mmol/L (ref 136–145)
TOTAL PROTEIN: 6.6 g/dL (ref 6.4–8.3)

## 2018-03-03 LAB — CBC WITH DIFFERENTIAL/PLATELET
BASOS PCT: 1 %
Basophils Absolute: 0 10*3/uL (ref 0.0–0.1)
EOS ABS: 0.2 10*3/uL (ref 0.0–0.5)
Eosinophils Relative: 5 %
HCT: 38.9 % (ref 34.8–46.6)
HEMOGLOBIN: 13.2 g/dL (ref 11.6–15.9)
Lymphocytes Relative: 21 %
Lymphs Abs: 0.7 10*3/uL — ABNORMAL LOW (ref 0.9–3.3)
MCH: 30.4 pg (ref 25.1–34.0)
MCHC: 33.9 g/dL (ref 31.5–36.0)
MCV: 89.8 fL (ref 79.5–101.0)
Monocytes Absolute: 0.3 10*3/uL (ref 0.1–0.9)
Monocytes Relative: 9 %
NEUTROS PCT: 64 %
Neutro Abs: 2.2 10*3/uL (ref 1.5–6.5)
Platelets: 211 10*3/uL (ref 145–400)
RBC: 4.33 MIL/uL (ref 3.70–5.45)
RDW: 13.4 % (ref 11.2–14.5)
WBC: 3.5 10*3/uL — AB (ref 3.9–10.3)

## 2018-03-03 MED ORDER — TRASTUZUMAB CHEMO 150 MG IV SOLR
6.0000 mg/kg | Freq: Once | INTRAVENOUS | Status: AC
  Administered 2018-03-03: 357 mg via INTRAVENOUS
  Filled 2018-03-03: qty 17

## 2018-03-03 MED ORDER — SODIUM CHLORIDE 0.9% FLUSH
10.0000 mL | Freq: Once | INTRAVENOUS | Status: AC
  Administered 2018-03-03: 10 mL
  Filled 2018-03-03: qty 10

## 2018-03-03 MED ORDER — HEPARIN SOD (PORK) LOCK FLUSH 100 UNIT/ML IV SOLN
500.0000 [IU] | Freq: Once | INTRAVENOUS | Status: AC | PRN
  Administered 2018-03-03: 500 [IU]
  Filled 2018-03-03: qty 5

## 2018-03-03 MED ORDER — ACETAMINOPHEN 325 MG PO TABS
ORAL_TABLET | ORAL | Status: AC
  Filled 2018-03-03: qty 2

## 2018-03-03 MED ORDER — LIDOCAINE-PRILOCAINE 2.5-2.5 % EX CREA: TOPICAL_CREAM | CUTANEOUS | 3 refills | Status: DC

## 2018-03-03 MED ORDER — SODIUM CHLORIDE 0.9 % IV SOLN
Freq: Once | INTRAVENOUS | Status: AC
  Administered 2018-03-03: 12:00:00 via INTRAVENOUS

## 2018-03-03 MED ORDER — DIPHENHYDRAMINE HCL 25 MG PO CAPS
ORAL_CAPSULE | ORAL | Status: AC
  Filled 2018-03-03: qty 2

## 2018-03-03 MED ORDER — SODIUM CHLORIDE 0.9% FLUSH
10.0000 mL | INTRAVENOUS | Status: DC | PRN
  Administered 2018-03-03: 10 mL
  Filled 2018-03-03: qty 10

## 2018-03-03 MED ORDER — DIPHENHYDRAMINE HCL 25 MG PO CAPS
50.0000 mg | ORAL_CAPSULE | Freq: Once | ORAL | Status: AC
  Administered 2018-03-03: 50 mg via ORAL

## 2018-03-03 MED ORDER — ACETAMINOPHEN 325 MG PO TABS
650.0000 mg | ORAL_TABLET | Freq: Once | ORAL | Status: AC
  Administered 2018-03-03: 650 mg via ORAL

## 2018-03-03 NOTE — Telephone Encounter (Signed)
Appts scheduled AVS/Calendar printed and delivered to patient in infusion per 5/10 los

## 2018-03-03 NOTE — Patient Instructions (Signed)
[facility] Discharge Instructions for Patients Receiving Chemotherapy  Today you received the following chemotherapy agents Herceptin  To help prevent nausea and vomiting after your treatment, we encourage you to take your nausea medication as directed   If you develop nausea and vomiting that is not controlled by your nausea medication, call the clinic.   BELOW ARE SYMPTOMS THAT SHOULD BE REPORTED IMMEDIATELY:  *FEVER GREATER THAN 100.5 F  *CHILLS WITH OR WITHOUT FEVER  NAUSEA AND VOMITING THAT IS NOT CONTROLLED WITH YOUR NAUSEA MEDICATION  *UNUSUAL SHORTNESS OF BREATH  *UNUSUAL BRUISING OR BLEEDING  TENDERNESS IN MOUTH AND THROAT WITH OR WITHOUT PRESENCE OF ULCERS  *URINARY PROBLEMS  *BOWEL PROBLEMS  UNUSUAL RASH Items with * indicate a potential emergency and should be followed up as soon as possible.  Feel free to call the clinic should you have any questions or concerns. The clinic phone number is [phone].  Please show the CHEMO ALERT CARD at check-in to the Emergency Department and triage nurse.   

## 2018-03-03 NOTE — Progress Notes (Addendum)
[facility]ath[facility]on  [phone] [phone]  Clinic Follow up Note   Patient Care Team: Puglisi, Malachy Chamber, FNP as PCP - General (Nurse Practitioner) [name], MD as Consulting Physician (General Surgery) [name], MD as Consulting Physician (Hematology) [name], MD as Consulting Physician (Radiation Oncology) 03/03/2018  SUMMARY OF ONCOLOGIC HISTORY: Oncology History   Cancer Staging Malignant neoplasm of upper-outer quadrant of left breast in female, estrogen receptor positive ([place]) Staging form: Breast, AJCC 8th Edition - Clinical stage from 07/06/2017: Stage IA (cT1c, cN0, cM0, G3, ER: Positive, PR: Positive, HER2: Positive) - Unsigned Staging comments: Staged at breast conference 9.12.18 - Pathologic stage from 07/13/2017: Stage IA (pT2, pN0, cM0, G3, ER: Positive, PR: Positive, HER2: Positive) - Signed by [name], MD on 08/16/2017       Malignant neoplasm of upper-outer quadrant of left breast in female, estrogen receptor positive ([place])   06/23/2017 Mammogram    Diagnostic mammogram 06/23/17 IMPRESSION: Indeterminate mass with irregular margins at the 2:30 position 4 cm from nipple measuring 1.4 x 0.8 x 1.2 cm in the upper-outer left breast.      06/28/2017 Initial Biopsy    Diagnosis 06/28/17 Breast, left, needle core biopsy, upper outer quadrant, 2:30 o'clock, 4cm from nipple - INVASIVE MAMMARY CARCINOMA WITH CALCIFICATIONS, SEE COMMENT.      06/28/2017 Receptors her2    Estrogen Receptor: 100%, POSITIVE, STRONG STAINING INTENSITY Progesterone Receptor: 20%, POSITIVE, STRONG STAINING INTENSITY Proliferation Marker Ki67: 25% HER2: Postive       06/30/2017 Initial Diagnosis    Malignant neoplasm of upper-outer quadrant of left breast in female, estrogen receptor positive ([place])      07/13/2017 Surgery    Left mastectomy with SLN biopsy performed by Dr Marlou Starks.       07/13/2017 Pathology Results    Diagnosis  1. Breast, simple  mastectomy, Left - INVASIVE AND IN SITU DUCTAL CARCINOMA, 4 CM, MSBR GRADE 3. - MARGINS NOT INVOLVED. - SEE ONCOLOGY TABLE. 2. Lymph node, sentinel, biopsy, Left #1 - ONE BENIGN LYMPH NODE (0/1). 3. Lymph node, biopsy, Left #2 - ONE BENIGN LYMPH NODE (0/1).      07/18/2017 - 07/27/2017 Hospital Admission    Admit date: 07/18/17 Admission diagnosis: spontaneous PNX Additional comments: the patient was admitted with a pneumothorax several days after a port was placed although her post procedure cxr was normal. She had a chest tube placed but had a persistent air leak. The pleurivac was placed to 40 cm h2o suction for several days and the pneumothorax finally resolved.       08/15/2017 Imaging    CT A/P IMPRESSION: 1. No CT findings to suggest metastatic disease involving the abdomen/pelvis. 2. Left chest wall fluid collection as discussed above. 3. Small right pleural effusion with minimal overlying atelectasis. 4. Cholelithiasis. 5. Small periumbilical abdominal wall hernia containing fat.       08/15/2017 Imaging    NM Whole Body Bone Scan FINDINGS: Uptake at the LEFT lateral aspects of the cervical and lower thoracic spine, typically degenerative.  Uptake at the shoulders, elbows, wrists, hips, and knees, typically degenerative.  Asymmetric uptake at the sternoclavicular joints RIGHT greater than LEFT which may also be degenerative.  No definite foci of abnormal osseous tracer accumulation are identified which are suspicious for osseous metastases.  Biconvex thoracolumbar scoliosis.  Expected urinary tract and soft tissue distribution of tracer.  IMPRESSION: Scattered likely degenerative type uptake as above.  No definite scintigraphic evidence of osseous metastatic  disease.      08/16/2017 Genetic Testing    The patient had genetic testing due to a personal and family history of breast cancer.  The [employer] Multi-Cancer Panel was ordered. The Multi-Cancer  Panel offered by [employer] includes sequencing and/or deletion duplication testing of the following 83 genes: ALK, APC, ATM, AXIN2,BAP1,  BARD1, BLM, BMPR1A, BRCA1, BRCA2, BRIP1, CASR, CDC73, CDH1, CDK4, CDKN1B, CDKN1C, CDKN2A (p14ARF), CDKN2A (p16INK4a), CEBPA, CHEK2, CTNNA1, DICER1, DIS3L2, EGFR (c.2369C>T, p.Thr790Met variant only), EPCAM (Deletion/duplication testing only), FH, FLCN, GATA2, GPC3, GREM1 (Promoter region deletion/duplication testing only), HOXB13 (c.251G>A, p.Gly84Glu), HRAS, KIT, MAX, MEN1, MET, MITF (c.952G>A, p.Glu318Lys variant only), MLH1, MSH2, MSH3, MSH6, MUTYH, NBN, NF1, NF2, NTHL1, PALB2, PDGFRA, PHOX2B, PMS2, POLD1, POLE, POT1, PRKAR1A, PTCH1, PTEN, RAD50, RAD51C, RAD51D, RB1, RECQL4, RET, RUNX1, SDHAF2, SDHA (sequence changes only), SDHB, SDHC, SDHD, SMAD4, SMARCA4, SMARCB1, SMARCE1, STK11, SUFU, TERC, TERT, TMEM127, TP53, TSC1, TSC2, VHL, WRN and WT1.   Results: Negative, no pathogenic variants identified in the genes analyzed.  The date of this test report is 08/16/2017.         08/26/2017 - 11/04/2017 Chemotherapy    adjuvant TCHP with Onpro q3weeks for 4 cycles followed herceptin maintenance therapy every 3 weeks starting 12/28/17      08/31/2017 Breast US    On physical exam,there is a firm smooth mass in the low left axilla spanning at least 5 cm.  Ultrasound is performed, showing a complicated fluid collection which is predominantly anechoic measuring 5.2 x 3.9 x 3.5 cm. No blood flow was identified within the soft tissue component of the mass on color Doppler imaging. The appearance is most consistent with a postoperative seroma/resolving hematoma.  IMPRESSION: The complex fluid collection in the left axilla is very likely to represent a postoperative seroma/ resolving hematoma.  RECOMMENDATION: Three-month follow-up left axillary ultrasound is recommended.      CURRENT THERAPY: Maintenance Herceptin every 3 weeks starting 12/28/17. PENDING adjuvant  Anastrozole daily   INTERVAL HISTORY: [name] returns for follow up and cycle 9 herceptin as scheduled. She continues to have concerns about leg swelling and herceptin therapy. She is tolerating herceptin well but wants to discuss 6 vs 12 months of therapy. It is also very expensive. She has not started anastrozole because she is concerned about side effects she has read about. She developed left upper extremity edema with redness 2-3 weeks ago, denies pain or injury. Bracelet is tight around her wrist.   REVIEW OF SYSTEMS:   Constitutional: Denies fatigue, fevers, chills or abnormal weight loss Eyes: Denies blurriness of vision Ears, nose, mouth, throat, and face: Denies mucositis or sore throat Respiratory: Denies cough, dyspnea or wheezes Cardiovascular: Denies palpitation, chest discomfort (+) lower extremity swelling, L>R, LUE edema x2-3 weeks  Gastrointestinal:  Denies nausea, vomiting, constipation, heartburn or change in bowel habits (+) periodic diarrhea  Skin: Denies abnormal skin rashes Lymphatics: Denies new lymphadenopathy or easy bruising Neurological:Denies numbness, tingling or new weaknesses Behavioral/Psych: Mood is stable, no new changes  All other systems were reviewed with the patient and are negative.  MEDICAL HISTORY:  Past Medical History:  Diagnosis Date  . Cancer of left breast ([place])   . Constipation   . Family history of breast cancer   . Fibrocystic breast   . Hyperlipidemia   . Ocular migraine    "used to have them monthly for a period of time; seemed to stop; now have had a couple in the last week" (07/13/2017)    SURGICAL  HISTORY: Past Surgical History:  Procedure Laterality Date  . BREAST BIOPSY Right ~ 2001   BREAST EXCISIONAL BIOPSY  . BREAST LUMPECTOMY Left 2001   chemo and radiation  . CESAREAN SECTION  X 1  . IR CATHETER TUBE CHANGE  07/20/2017  . MASTECTOMY COMPLETE / SIMPLE W/ SENTINEL NODE BIOPSY Left 07/13/2017    LEFT MASTECTOMY  WITH SENTINEL NODE MAPPING ERAS PATHWAY [name] 07/13/2017  . MASTECTOMY W/ SENTINEL NODE BIOPSY Left 07/13/2017   Procedure: LEFT MASTECTOMY WITH SENTINEL NODE MAPPING ERAS PATHWAY;  Surgeon: [name], MD;  Location: [place];  Service: General;  Laterality: Left;  . PORTACATH PLACEMENT Right 07/13/2017  . PORTACATH PLACEMENT Right 07/13/2017   Procedure: INSERTION PORT-A-CATH;  Surgeon: [name], MD;  Location: [place];  Service: General;  Laterality: Right;  . TONSILLECTOMY      I have reviewed the social history and family history with the patient and they are unchanged from previous note.  ALLERGIES:  has No Known Allergies.  MEDICATIONS:  Current Outpatient Medications  Medication Sig Dispense Refill  . aspirin 81 MG chewable tablet Chew 81 mg by mouth daily.    . fluticasone (FLONASE) 50 MCG/ACT nasal spray Place 2 sprays into the nose daily as needed.    . furosemide (LASIX) 20 MG tablet Take 1 tablet (20 mg total) by mouth daily. 10 tablet 0  . lidocaine-prilocaine (EMLA) cream Apply to affected area once 30 g 3  . Multiple Vitamin (MULTIVITAMIN WITH MINERALS) TABS tablet Take 1 tablet by mouth daily.    . ondansetron (ZOFRAN) 8 MG tablet Take 1 tablet (8 mg total) by mouth 2 (two) times daily as needed for refractory nausea / vomiting. Start on day 3 after chemo. 30 tablet 1  . polyethylene glycol powder (GLYCOLAX/MIRALAX) powder MIX 17 GRAMS IN LIQUID AND DRINK DAILY IN THE EVENING    . potassium chloride SA (K-DUR,KLOR-CON) 20 MEQ tablet Take 1 tablet (20 mEq total) by mouth 2 (two) times daily. (Patient taking differently: Take 20 mEq by mouth daily. ) 10 tablet 0  . prochlorperazine (COMPAZINE) 10 MG tablet Take 1 tablet (10 mg total) by mouth every 6 (six) hours as needed (Nausea or vomiting). 30 tablet 1  . simvastatin (ZOCOR) 40 MG tablet Take 40 mg daily by mouth.    . vitamin C (ASCORBIC ACID) 500 MG tablet Take 500 mg by mouth daily.     No current  facility-administered medications for this visit.    Facility-Administered Medications Ordered in Other Visits  Medication Dose Route Frequency Provider Last Rate Last Dose  . heparin lock flush 100 unit/mL  500 Units Intracatheter Once PRN [name], MD      . sodium chloride flush (NS) 0.9 % injection 10 mL  10 mL Intracatheter PRN [name], MD      . trastuzumab (HERCEPTIN) 357 mg in sodium chloride 0.9 % 250 mL chemo infusion  6 mg/kg (Treatment Plan Recorded) Intravenous Once [name], MD        PHYSICAL EXAMINATION: ECOG PERFORMANCE STATUS: 1 - Symptomatic but completely ambulatory  Vitals:   03/03/18 1048  BP: (!) 144/57  Pulse: 64  Resp: 18  Temp: 98 F (36.7 C)  SpO2: 98%   Filed Weights   03/03/18 1048  Weight: 143 lb 1.6 oz (64.9 kg)    GENERAL:alert, no distress and comfortable SKIN: skin color, texture, turgor are normal, no rashes or significant lesions EYES: normal, Conjunctiva are pink and  non-injected, sclera clear OROPHARYNX:no exudate, no erythema and lips, buccal mucosa, and tongue normal  LYMPH:  no palpable cervical, supraclavicular, or axillary lymphadenopathy LUNGS: clear to auscultation with normal breathing effort HEART: regular rate & rhythm and no murmurs (+) left > right lower extremity edema with venous stasis changes on left ABDOMEN:abdomen soft, non-tender and normal bowel sounds Musculoskeletal:no cyanosis of digits and no clubbing. 1+ pitting LUE edema with from elbow to wrist with mild erythema  NEURO: alert & oriented x 3 with fluent speech, no focal motor/sensory deficits Breast exam deferred  PAC without erythema   LABORATORY DATA:  I have reviewed the data as listed CBC Latest Ref Rng & Units 03/03/2018 01/20/2018 12/28/2017  WBC 3.9 - 10.3 K/uL 3.5(L) 3.6(L) 6.7  Hemoglobin 11.6 - 15.9 g/dL 13.2 13.1 13.0  Hematocrit 34.8 - 46.6 % 38.9 40.0 39.4  Platelets 145 - 400 K/uL 211 206 257     CMP Latest Ref Rng & Units 03/03/2018  01/20/2018 12/28/2017  Glucose 70 - 140 mg/dL 82 84 154(H)  BUN 7 - 26 mg/dL _0 Creatinine 0.60 - 1.10 mg/dL 0.61 0.61 0.64  Sodium 136 - 145 mmol/L 139 137 138  Potassium 3.5 - 5.1 mmol/L 4.1 4.4 4.2  Chloride 98 - 109 mmol/L 105 103 103  CO2 22 - 29 mmol/L _1 Calcium 8.4 - 10.4 mg/dL 9.0 9.4 9.5  Total Protein 6.4 - 8.3 g/dL 6.6 6.3(L) 6.9  Total Bilirubin 0.2 - 1.2 mg/dL 0.4 0.3 0.3  Alkaline Phos 40 - 150 U/L 95 85 100  AST 5 - 34 U/L _2 ALT 0 - 55 U/L _3 PATHOLOGY  Diagnosis 07/13/17 1. Breast, simple mastectomy, Left - INVASIVE AND IN SITU DUCTAL CARCINOMA, 4 CM, MSBR GRADE 3. - MARGINS NOT INVOLVED. - SEE ONCOLOGY TABLE. 2. Lymph node, sentinel, biopsy, Left #1 - ONE BENIGN LYMPH NODE (0/1). 3. Lymph node, biopsy, Left #2 - ONE BENIGN LYMPH NODE (0/1). Microscopic Comment 1. BREAST, INVASIVE TUMOR Procedure: Mastectomy and two sentinel lymph nodes Laterality: Left breast Tumor Size: 4.0 cm Histologic Type: Ductal Grade: 3 Tubular Differentiation: 3 Nuclear Pleomorphism: 2 Mitotic Count: 2 Ductal Carcinoma in Situ (DCIS): Present, intermediate grade Extent of Tumor: Skin: Free of tumor Nipple: Free of tumor Skeletal muscle: Free of tumor Margins: Free of tumor Invasive carcinoma, distance from closest margin: 2 cm from posterior margin DCIS, distance from closest margin: 2 cm from posterior margin Regional Lymph Nodes: Number of Lymph Nodes Examined: 2 Number of Sentinel Lymph Nodes Examined: 2 Lymph Nodes with Macrometastases: 0 Lymph Nodes with Micrometastases: 0 1 of 3 FINAL for CAMBRE, MATSON ([id number]) Microscopic Comment(continued) Lymph Nodes with Isolated Tumor Cells: 0 Breast Prognostic Profile: Case number [id number] Estrogen Receptor: 100%, positive, strong staining Progesterone Receptor: 20%, positive, strong staining Her2: Equivocal by FISH, ratio 1.49 and average copy number 4.2. Positive by  immunohistochemistry (3+) Ki-67: 25% Best tumor block for sendout testing: 1C Pathologic Stage Classification (pTNM, AJCC 8th Edition): Primary Tumor (pT): pT2 Regional Lymph Nodes (pN): pN0 Distant Metastases (pM): pMX  Diagnosis 06/28/17 Breast, left, needle core biopsy, upper outer quadrant, 2:30 o'clock, 4cm from nipple - INVASIVE MAMMARY CARCINOMA WITH CALCIFICATIONS, SEE COMMENT. Microscopic Comment The carcinoma appears grade 3. E-cadherin will be ordered and reported in an addendum. Prognostic markers will be ordered. Dr. Melina Copa has reviewed the case. The case was called to [facility] on 06/29/2017.  FLUORESCENCE IN-SITU HYBRIDIZATION Results: HER2 - *EQUIVOCAL* (Her2 by IHC ordered) RATIO OF HER2/CEP17 SIGNALS 1.49 AVERAGE HER2 COPY NUMBER PER CELL 4.20 Reference Range: NEGATIVE HER2/CEP17 Ratio <2.0 and average HER2 copy number <4.0 EQUIVOCAL HER2/CEP17 Ratio <2.0 and average HER2 copy number >=4.0 and <6.0 POSITIVE HER2/CEP17 Ratio >=2.0 or <2.0 and average HER2 copy number >=6.0 PROGNOSTIC INDICATORS Results: IMMUNOHISTOCHEMICAL AND MORPHOMETRIC ANALYSIS PERFORMED MANUALLY Estrogen Receptor: 100%, POSITIVE, STRONG STAINING INTENSITY Progesterone Receptor: 20%, POSITIVE, STRONG STAINING INTENSITY Proliferation Marker Ki67: 25% 1 of 3 FINAL for RENIKA, SHIFLET [phone]) ADDITIONAL INFORMATION:(continued) REFERENCE RANGE ESTROGEN RECEPTOR NEGATIVE 0% POSITIVE =>1% REFERENCE RANGE PROGESTERONE RECEPTOR NEGATIVE 0% POSITIVE =>1% All controls stained appropriately   RADIOGRAPHIC STUDIES: I have personally reviewed the radiological images as listed and agreed with the findings in the report. No results found.   ASSESSMENT & PLAN: ALVIN RUBANO is a 82 y.o. caucasian female with a history of breast cancer, HLD, presented with screening discovered left breast cancer.  1. Malignant neoplasm of upper-outer quadrant of left breast in  female, invasive ductal carcinoma, pT2N0M0, Stage: 1A, triple positive, Grade 3 -Ms. Oliff appears stable. She completed cycle 8 herceptin on 02/10/18. She has many questions about the duration of therapy. I reviewed previous evidence quoted by Dr. Burr Medico stating 12 months is standard of care when the patient did not receive adriamycin-based chemotherapy. She understands the evidence, but based on the cost of therapy and her concerns of leg edema, she is considering stopping ~9 months of therapy. She agrees to proceed today with another cycle.  -I informed her there is financial assistance; will send a message to financial advocate to call patient to discuss payments  -She has not started AI due to concern for side effects. We reviewed rationale and potential side effects in detail. She agrees to try anastrozole. Dr. Burr Medico recommended beginning with 1/2 tab x1 week then increasing to full dose if tolerating. Patient agrees.  -will see her back in 3 weeks with next cycle herceptin and to monitor side effects of AI.  2. Bilateral lower extremity edema, L>R; cellulitis 11/2017; negative DVT per doppler  -Drs Burr Medico and Bensimnbhon independently feel LE edema is not related to herceptin. Cardiology has ordered ABIs.   3. History of invasive and in situ ductal carcinoma of left breast, 10/26/1999, ER negative, stage 1, grade 3 - treated in [place], s/p lumpectomy (SN negative, 2-3 removed), chemo, radiation  4. Osteopenia We reviewed AI can weaken the bone, I encouraged her to continue physical exercise. Will monitor DEXA.   5. LUE edema, lymphedema vs DVT -She developed lower left arm edema with mild erythema 2-3 weeks ago. She had left lumpectomy with SN sampling in 2001 and left mastectomy with LNB in 2018. This is likely lymphedema. Will get doppler to rule out DVT. If doppler is negative, patient will return to PT for lymphedema   PLAN -Labs reviewed, proceed with cycle 9 herceptin, continue q3  weeks -Begin anastrozole, OK to take 1/2 tab x1 week then increase to full dose if tolerating -LUE doppler to r/o DVT -Lab, f/u in 3 weeks with next cycle herceptin and monitor side effects to AI   All questions were answered. The patient knows to call the clinic with any problems, questions or concerns. No barriers to learning was detected. I spent 20 minutes counseling the patient face to face. The total time spent in the appointment was 25 minutes and more than 50% was on counseling and review of test  results     Alla Feeling, NP 03/03/18   Addendum  I have seen the patient, examined her. I agree with the assessment and and plan and have edited the notes.   Roshawnda has not started anastrozole, I again explained the benefits and potential side effects to her.  I explained to her that most of the side effects are reversible, if she does have severe side effects, we will.  She finally agreed to try.  She also has concerns about side effects from Herceptin, would like to stop after 9 months of treatment.  She agrees to continue for now.  We will continue monitoring.  She has developed a significant left arm edema, likely lymphedema from her breast surgery.  We will get a Doppler to rule out DVT, and refer her to physical therapy.  She agrees.  [name]  03/03/2018

## 2018-03-06 ENCOUNTER — Ambulatory Visit (HOSPITAL_COMMUNITY): Payer: Medicare HMO

## 2018-03-08 ENCOUNTER — Ambulatory Visit (HOSPITAL_COMMUNITY)
Admission: RE | Admit: 2018-03-08 | Discharge: 2018-03-08 | Disposition: A | Discharge status: Home / Self Care | Payer: Medicare HMO | Source: Ambulatory Visit | Attending: Nurse Practitioner | Admitting: Nurse Practitioner

## 2018-03-08 DIAGNOSIS — M7989 Other specified soft tissue disorders: Secondary | ICD-10-CM | POA: Insufficient documentation

## 2018-03-08 NOTE — Progress Notes (Signed)
Preliminary notes--Left upper extremity venous duplex exam completed. Negative for DVT. A heterogenous structure measuring 2.66x0.54cm at the region of axilla adjacent to left breast. Result called Dr. Kalman Shan, spoke to [facility]. Patient is here waiting for instruction.   Called back Dr. Kalman Shan and discussed the result. Patient was instructed to go home.   Hongying Magdalina Whitehead (RDMS RVT) 03/08/18 11:40 AM

## 2018-03-23 ENCOUNTER — Encounter: Payer: Self-pay | Admitting: Hematology

## 2018-03-23 NOTE — Progress Notes (Signed)
[facility]  [phone] [phone]  Clinic Follow Up Note   Patient Care Team: Puglisi, Malachy Chamber, FNP as PCP - General (Nurse Practitioner) [name], MD as Consulting Physician (General Surgery) [name], MD as Consulting Physician (Hematology) [name], MD as Consulting Physician (Radiation Oncology)   Date of Service:  03/24/2018   CHIEF COMPLAINTS:  F/u breast cancer   Oncology History   Cancer Staging Malignant neoplasm of upper-outer quadrant of left breast in female, estrogen receptor positive ([place]) Staging form: Breast, AJCC 8th Edition - Clinical stage from 07/06/2017: Stage IA (cT1c, cN0, cM0, G3, ER: Positive, PR: Positive, HER2: Positive) - Unsigned Staging comments: Staged at breast conference 9.12.18 - Pathologic stage from 07/13/2017: Stage IA (pT2, pN0, cM0, G3, ER: Positive, PR: Positive, HER2: Positive) - Signed by [name], MD on 08/16/2017       Malignant neoplasm of upper-outer quadrant of left breast in female, estrogen receptor positive ([place])   06/23/2017 Mammogram    Diagnostic mammogram 06/23/17 IMPRESSION: Indeterminate mass with irregular margins at the 2:30 position 4 cm from nipple measuring 1.4 x 0.8 x 1.2 cm in the upper-outer left breast.      06/28/2017 Initial Biopsy    Diagnosis 06/28/17 Breast, left, needle core biopsy, upper outer quadrant, 2:30 o'clock, 4cm from nipple - INVASIVE MAMMARY CARCINOMA WITH CALCIFICATIONS, SEE COMMENT.      06/28/2017 Receptors her2    Estrogen Receptor: 100%, POSITIVE, STRONG STAINING INTENSITY Progesterone Receptor: 20%, POSITIVE, STRONG STAINING INTENSITY Proliferation Marker Ki67: 25% HER2: Postive       06/30/2017 Initial Diagnosis    Malignant neoplasm of upper-outer quadrant of left breast in female, estrogen receptor positive ([place])      07/13/2017 Surgery    Left mastectomy with SLN biopsy performed by Dr Marlou Starks.       07/13/2017 Pathology Results   Diagnosis  1. Breast, simple mastectomy, Left - INVASIVE AND IN SITU DUCTAL CARCINOMA, 4 CM, MSBR GRADE 3. - MARGINS NOT INVOLVED. - SEE ONCOLOGY TABLE. 2. Lymph node, sentinel, biopsy, Left #1 - ONE BENIGN LYMPH NODE (0/1). 3. Lymph node, biopsy, Left #2 - ONE BENIGN LYMPH NODE (0/1).      07/18/2017 - 07/27/2017 Hospital Admission    Admit date: 07/18/17 Admission diagnosis: spontaneous PNX Additional comments: the patient was admitted with a pneumothorax several days after a port was placed although her post procedure cxr was normal. She had a chest tube placed but had a persistent air leak. The pleurivac was placed to 40 cm h2o suction for several days and the pneumothorax finally resolved.       08/15/2017 Imaging    CT A/P IMPRESSION: 1. No CT findings to suggest metastatic disease involving the abdomen/pelvis. 2. Left chest wall fluid collection as discussed above. 3. Small right pleural effusion with minimal overlying atelectasis. 4. Cholelithiasis. 5. Small periumbilical abdominal wall hernia containing fat.       08/15/2017 Imaging    NM Whole Body Bone Scan FINDINGS: Uptake at the LEFT lateral aspects of the cervical and lower thoracic spine, typically degenerative.  Uptake at the shoulders, elbows, wrists, hips, and knees, typically degenerative.  Asymmetric uptake at the sternoclavicular joints RIGHT greater than LEFT which may also be degenerative.  No definite foci of abnormal osseous tracer accumulation are identified which are suspicious for osseous metastases.  Biconvex thoracolumbar scoliosis.  Expected urinary tract and soft tissue distribution of tracer.  IMPRESSION: Scattered likely degenerative type uptake as  above.  No definite scintigraphic evidence of osseous metastatic disease.      08/16/2017 Genetic Testing    The patient had genetic testing due to a personal and family history of breast cancer.  The [employer] Multi-Cancer Panel  was ordered. The Multi-Cancer Panel offered by [facility] includes sequencing and/or deletion duplication testing of the following 83 genes: ALK, APC, ATM, AXIN2,BAP1,  BARD1, BLM, BMPR1A, BRCA1, BRCA2, BRIP1, CASR, CDC73, CDH1, CDK4, CDKN1B, CDKN1C, CDKN2A (p14ARF), CDKN2A (p16INK4a), CEBPA, CHEK2, CTNNA1, DICER1, DIS3L2, EGFR (c.2369C>T, p.Thr790Met variant only), EPCAM (Deletion/duplication testing only), FH, FLCN, GATA2, GPC3, GREM1 (Promoter region deletion/duplication testing only), HOXB13 (c.251G>A, p.Gly84Glu), HRAS, KIT, MAX, MEN1, MET, MITF (c.952G>A, p.Glu318Lys variant only), MLH1, MSH2, MSH3, MSH6, MUTYH, NBN, NF1, NF2, NTHL1, PALB2, PDGFRA, PHOX2B, PMS2, POLD1, POLE, POT1, PRKAR1A, PTCH1, PTEN, RAD50, RAD51C, RAD51D, RB1, RECQL4, RET, RUNX1, SDHAF2, SDHA (sequence changes only), SDHB, SDHC, SDHD, SMAD4, SMARCA4, SMARCB1, SMARCE1, STK11, SUFU, TERC, TERT, TMEM127, TP53, TSC1, TSC2, VHL, WRN and WT1.   Results: Negative, no pathogenic variants identified in the genes analyzed.  The date of this test report is 08/16/2017.         08/26/2017 - 11/04/2017 Chemotherapy    adjuvant TCHP with Onpro q3weeks for 4 cycles followed herceptin maintenance therapy every 3 weeks starting 12/28/17      08/31/2017 Breast US    On physical exam,there is a firm smooth mass in the low left axilla spanning at least 5 cm.  Ultrasound is performed, showing a complicated fluid collection which is predominantly anechoic measuring 5.2 x 3.9 x 3.5 cm. No blood flow was identified within the soft tissue component of the mass on color Doppler imaging. The appearance is most consistent with a postoperative seroma/resolving hematoma.  IMPRESSION: The complex fluid collection in the left axilla is very likely to represent a postoperative seroma/ resolving hematoma.  RECOMMENDATION: Three-month follow-up left axillary ultrasound is recommended.        HISTORY OF PRESENTING ILLNESS: 07/06/17 Kathleen Hanson  82 y.o. female is here because of newly diagnosed Malignant neoplasm of upper-outer quadrant of left breast in female, estrogen receptor positive. She presents to Breast Clinic alone today.   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 Her sister had breast cancer when she was 82. Her father had bladder cancer due to smoking. She denies any other significant medical history. She smoked from [age] yo, less than a pack.   Today she reports to feeling her lump after her mammogram and she does not have any pain.  She denies change in weight, appetite or pain. She has been active with weight watchers and has purposefully lost weight.  She did not ask husband to attend but he would if she needed him to. She has two children, daughter and adopted son. She is retired, but previously a Pharmacist, hospital. 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 She wears hearing aids.    GYN HISTORY  Menarchal: 13-14 LMP: early 32s in 1992 Contraceptive: No HRT: No GP: G2P1A1, prior miscarriage, Has daughter and adopted son   CURRENT THERAPY:  Maintenance Herceptin every 3 weeks starting 12/28/17 and stopped after 10th cycle on 03/24/18. Adjuvant Anastrozole daily starting 03/13/18   INTERVAL HISTORY:    Kathleen Hanson is here for a follow up and cycle 10 Herceptin maintenance therapy. She presents to the clinic today by herself. She notes she was wondering  when she can stop Herceptin. During the infusion she can tolerate it. She notes her left lower extremity edema is much improved. She notes she uses compression stocking as needed and she is planning to take Lasix BID for a few days to help further reduce this. She notes she just started Anastrozole in the last week. She plans to see Dr. Barry Dienes in July. She is willing to get her port removed. She notes she takes a multivitamin with 1000 units.    On review of symptoms, pt notes improved left  lower extremity edema. He hair has been growing back well.       MEDICAL HISTORY:  Past Medical History:  Diagnosis Date  . Cancer of left breast ([place])   . Constipation   . Family history of breast cancer   . Fibrocystic breast   . Hyperlipidemia   . Ocular migraine    "used to have them monthly for a period of time; seemed to stop; now have had a couple in the last week" (07/13/2017)    SURGICAL HISTORY: Past Surgical History:  Procedure Laterality Date  . BREAST BIOPSY Right ~ 2001   BREAST EXCISIONAL BIOPSY  . BREAST LUMPECTOMY Left 2001   chemo and radiation  . CESAREAN SECTION  X 1  . IR CATHETER TUBE CHANGE  07/20/2017  . MASTECTOMY COMPLETE / SIMPLE W/ SENTINEL NODE BIOPSY Left 07/13/2017    LEFT MASTECTOMY WITH SENTINEL NODE MAPPING ERAS PATHWAY [name] 07/13/2017  . MASTECTOMY W/ SENTINEL NODE BIOPSY Left 07/13/2017   Procedure: LEFT MASTECTOMY WITH SENTINEL NODE MAPPING ERAS PATHWAY;  Surgeon: [name], MD;  Location: [facility];  Service: General;  Laterality: Left;  . PORTACATH PLACEMENT Right 07/13/2017  . PORTACATH PLACEMENT Right 07/13/2017   Procedure: INSERTION PORT-A-CATH;  Surgeon: [name], MD;  Location: [facility];  Service: General;  Laterality: Right;  . TONSILLECTOMY      SOCIAL HISTORY: Social History   Socioeconomic History  . Marital status: Married    Spouse name: Not on file  . Number of children: Not on file  . Years of education: Not on file  . Highest education level: Not on file  Occupational History  . Not on file  Social Needs  . Financial resource strain: Not on file  . Food insecurity:    Worry: Not on file    Inability: Not on file  . Transportation needs:    Medical: Not on file    Non-medical: Not on file  Tobacco Use  . Smoking status: Former Smoker    Packs/day: 0.50    Years: 15.00    Pack years: 7.50    Types: Cigarettes    Last attempt to quit: 1976    Years since quitting: 43.4  . Smokeless tobacco: Never Used    Substance and Sexual Activity  . Alcohol use: Yes    Alcohol/week: 3.0 oz    Types: 5 Glasses of wine per week  . Drug use: No  . Sexual activity: Not Currently  Lifestyle  . Physical activity:    Days per week: Not on file    Minutes per session: Not on file  . Stress: Not on file  Relationships  . Social connections:    Talks on phone: Not on file    Gets together: Not on file    Attends religious service: Not on file    Active member of club or organization: Not on file    Attends meetings of  clubs or organizations: Not on file    Relationship status: Not on file  . Intimate partner violence:    Fear of current or ex partner: Not on file    Emotionally abused: Not on file    Physically abused: Not on file    Forced sexual activity: Not on file  Other Topics Concern  . Not on file  Social History Narrative  . Not on file    FAMILY HISTORY: Family History  Problem Relation Age of Onset  . Cancer Father        bladder cancer  . Breast cancer Sister 23    ALLERGIES:  has No Known Allergies.  MEDICATIONS:  Current Outpatient Medications  Medication Sig Dispense Refill  . aspirin 81 MG chewable tablet Chew 81 mg by mouth daily.    . fluticasone (FLONASE) 50 MCG/ACT nasal spray Place 2 sprays into the nose daily as needed.    . furosemide (LASIX) 20 MG tablet Take 1 tablet (20 mg total) by mouth daily. 10 tablet 0  . lidocaine-prilocaine (EMLA) cream Apply to affected area once 30 g 3  . Multiple Vitamin (MULTIVITAMIN WITH MINERALS) TABS tablet Take 1 tablet by mouth daily.    . polyethylene glycol powder (GLYCOLAX/MIRALAX) powder MIX 17 GRAMS IN LIQUID AND DRINK DAILY IN THE EVENING    . potassium chloride SA (K-DUR,KLOR-CON) 20 MEQ tablet Take 1 tablet (20 mEq total) by mouth 2 (two) times daily. 10 tablet 0  . simvastatin (ZOCOR) 40 MG tablet Take 40 mg daily by mouth.    . vitamin C (ASCORBIC ACID) 500 MG tablet Take 500 mg by mouth daily.    . ondansetron  (ZOFRAN) 8 MG tablet Take 1 tablet (8 mg total) by mouth 2 (two) times daily as needed for refractory nausea / vomiting. Start on day 3 after chemo. (Patient not taking: Reported on 03/24/2018) 30 tablet 1  . prochlorperazine (COMPAZINE) 10 MG tablet Take 1 tablet (10 mg total) by mouth every 6 (six) hours as needed (Nausea or vomiting). (Patient not taking: Reported on 03/24/2018) 30 tablet 1   No current facility-administered medications for this visit.     REVIEW OF SYSTEMS:   Constitutional: Denies fevers, chills or abnormal night sweats  (+) hair growth Eyes: Denies blurriness of vision, double vision or watery eyes Ears, nose, mouth, throat, and face: Denies mucositis or sore throat (+) wears hearing aids Respiratory: Denies cough, dyspnea or wheezes Cardiovascular: Denies palpitation, chest discomfort (+) left lower extremity swelling, much improved Gastrointestinal:  Denies nausea or change in bowel habits   Skin: Denies rashes Lymphatics: Denies new lymphadenopathy or easy bruising (+) very mild right arm swelling Neurological:Denies numbness, tingling or new weaknesses Behavioral/Psych: Mood is stable, no new changes  All other systems were reviewed with the patient and are negative.  PHYSICAL EXAMINATION: ECOG PERFORMANCE STATUS:1  Vitals:   03/24/18 1128  BP: (!) 157/69  Pulse: (!) 58  Resp: 18  Temp: 98.4 F (36.9 C)  SpO2: 100%   Filed Weights   03/24/18 1128  Weight: 142 lb (64.4 kg)    GENERAL:alert, no distress and comfortable SKIN: No skin rash or lesions EYES: normal, conjunctiva are pink and non-injected, sclera clear OROPHARYNX:no exudate, no erythema and lips, buccal mucosa, and tongue normal  NECK: supple, thyroid normal size, non-tender, without nodularity LYMPH:  no palpable lymphadenopathy in the cervical, axillary or inguinal LUNGS: clear to auscultation and percussion with normal breathing effort HEART: regular rate & rhythm  and no murmurs and no  lower extremity edema ABDOMEN:abdomen soft, non-tender and normal bowel sounds Musculoskeletal:no cyanosis of digits and no clubbing  EXT: 1+ edema of left lower extremity up to the knee, with mild calf tenderness,  trace edema in the right lower extremity below knee, no calf tenderness. PSYCH: alert & oriented x 3 with fluent speech NEURO: no focal motor/sensory deficits BREAST: exam deferred today    LABORATORY DATA:  I have reviewed the data as listed CBC Latest Ref Rng & Units 03/24/2018 03/03/2018 01/20/2018  WBC 3.9 - 10.3 K/uL 3.6(L) 3.5(L) 3.6(L)  Hemoglobin 11.6 - 15.9 g/dL 13.2 13.2 13.1  Hematocrit 34.8 - 46.6 % 38.8 38.9 40.0  Platelets 145 - 400 K/uL 233 211 206    CMP Latest Ref Rng & Units 03/24/2018 03/03/2018 01/20/2018  Glucose 70 - 140 mg/dL 91 82 84  BUN 7 - 26 mg/dL _0 Creatinine 0.60 - 1.10 mg/dL 0.60 0.61 0.61  Sodium 136 - 145 mmol/L 138 139 137  Potassium 3.5 - 5.1 mmol/L 4.3 4.1 4.4  Chloride 98 - 109 mmol/L 103 105 103  CO2 22 - 29 mmol/L _1 Calcium 8.4 - 10.4 mg/dL 9.2 9.0 9.4  Total Protein 6.4 - 8.3 g/dL 6.8 6.6 6.3(L)  Total Bilirubin 0.2 - 1.2 mg/dL 0.4 0.4 0.3  Alkaline Phos 40 - 150 U/L 88 95 85  AST 5 - 34 U/L _2 ALT 0 - 55 U/L _3 PATHOLOGY  Diagnosis 07/13/17 1. Breast, simple mastectomy, Left - INVASIVE AND IN SITU DUCTAL CARCINOMA, 4 CM, MSBR GRADE 3. - MARGINS NOT INVOLVED. - SEE ONCOLOGY TABLE. 2. Lymph node, sentinel, biopsy, Left #1 - ONE BENIGN LYMPH NODE (0/1). 3. Lymph node, biopsy, Left #2 - ONE BENIGN LYMPH NODE (0/1). Microscopic Comment 1. BREAST, INVASIVE TUMOR Procedure: Mastectomy and two sentinel lymph nodes Laterality: Left breast Tumor Size: 4.0 cm Histologic Type: Ductal Grade: 3 Tubular Differentiation: 3 Nuclear Pleomorphism: 2 Mitotic Count: 2 Ductal Carcinoma in Situ (DCIS): Present, intermediate grade Extent of Tumor: Skin: Free of tumor Nipple: Free of tumor Skeletal muscle:  Free of tumor Margins: Free of tumor Invasive carcinoma, distance from closest margin: 2 cm from posterior margin DCIS, distance from closest margin: 2 cm from posterior margin Regional Lymph Nodes: Number of Lymph Nodes Examined: 2 Number of Sentinel Lymph Nodes Examined: 2 Lymph Nodes with Macrometastases: 0 Lymph Nodes with Micrometastases: 0 1 of 3 FINAL for Kathleen Hanson, Kathleen Hanson ([id number]) Microscopic Comment(continued) Lymph Nodes with Isolated Tumor Cells: 0 Breast Prognostic Profile: Case number [id number] Estrogen Receptor: 100%, positive, strong staining Progesterone Receptor: 20%, positive, strong staining Her2: Equivocal by FISH, ratio 1.49 and average copy number 4.2. Positive by immunohistochemistry (3+) Ki-67: 25% Best tumor block for sendout testing: 1C Pathologic Stage Classification (pTNM, AJCC 8th Edition): Primary Tumor (pT): pT2 Regional Lymph Nodes (pN): pN0 Distant Metastases (pM): pMX  Diagnosis 06/28/17 Breast, left, needle core biopsy, upper outer quadrant, 2:30 o'clock, 4cm from nipple - INVASIVE MAMMARY CARCINOMA WITH CALCIFICATIONS, SEE COMMENT. Microscopic Comment The carcinoma appears grade 3. E-cadherin will be ordered and reported in an addendum. Prognostic markers will be ordered. Dr. Melina Copa has reviewed the case. The case was called to [facility] on 06/29/2017. FLUORESCENCE IN-SITU HYBRIDIZATION Results: HER2 - *EQUIVOCAL* (Her2 by IHC ordered) RATIO OF HER2/CEP17 SIGNALS 1.49 AVERAGE HER2 COPY NUMBER PER CELL 4.20 Reference Range: NEGATIVE HER2/CEP17 Ratio <2.0 and average  HER2 copy number <4.0 EQUIVOCAL HER2/CEP17 Ratio <2.0 and average HER2 copy number >=4.0 and <6.0 POSITIVE HER2/CEP17 Ratio >=2.0 or <2.0 and average HER2 copy number >=6.0 PROGNOSTIC INDICATORS Results: IMMUNOHISTOCHEMICAL AND MORPHOMETRIC ANALYSIS PERFORMED MANUALLY Estrogen Receptor: 100%, POSITIVE, STRONG STAINING INTENSITY Progesterone Receptor:  20%, POSITIVE, STRONG STAINING INTENSITY Proliferation Marker Ki67: 25% 1 of 3 FINAL for Kathleen Hanson, Kathleen Hanson [phone]) ADDITIONAL INFORMATION:(continued) REFERENCE RANGE ESTROGEN RECEPTOR NEGATIVE 0% POSITIVE =>1% REFERENCE RANGE PROGESTERONE RECEPTOR NEGATIVE 0% POSITIVE =>1% All controls stained appropriately    RADIOGRAPHIC STUDIES: I have personally reviewed the radiological images as listed and agreed with the findings in the report.  DG Bone Density 10/11/17 ASSESSMENT: The BMD measured at AP Spine L1-L3 is 1.072 g/cm2 with a T-score of -0.9.   NM Whole Body Scan 08/15/17 FINDINGS: Uptake at the LEFT lateral aspects of the cervical and lower thoracic spine, typically degenerative. Uptake at the shoulders, elbows, wrists, hips, and knees, typically degenerative. Asymmetric uptake at the sternoclavicular joints RIGHT greater than LEFT which may also be degenerative. No definite foci of abnormal osseous tracer accumulation are identified which are suspicious for osseous metastases. Biconvex thoracolumbar scoliosis. Expected urinary tract and soft tissue distribution of tracer. IMPRESSION: Scattered likely degenerative type uptake as above. No definite scintigraphic evidence of osseous metastatic disease.  CT A/P10/22/18 IMPRESSION: 1. No CT findings to suggest metastatic disease involving the abdomen/pelvis. 2. Left chest wall fluid collection as discussed above. 3. Small right pleural effusion with minimal overlying atelectasis. 4. Cholelithiasis. 5. Small periumbilical abdominal wall hernia containing fat.   ASSESSMENT & PLAN:  Kathleen Hanson is a 82 y.o. caucasian female with a history of breast cancer, HLD, presented with screening discovered left breast cancer.  1. Malignant neoplasm of upper-outer quadrant of left breast in female, invasive ductal carcinoma, pT2N0M0, Stage: 1A, triple positive, Grade 3 -She underwent mastectomy on 07/13/17, I previously  reviewed her surgical pathology results with patient in details. -Her primary tumor was 4 cm, grade 3, triple positive, surgical margins were negative, 2 sentinel lymph nodes were negative. -We previously discussed her invasive ductal tumor is early stage, but the Biology is more aggressive, due to HER2 positive, grade 3. She has moderate to high risk of recurrence after surgical resection. -She started adjuvant TCH with onpro on 08/26/17, with moderate side effects and overall not feeling well.  -I previously addressed her concern for quality of life while on chemotherapy.  Due to her multiple side effects from chemo, her advanced age, I stopped her chemo after cycle 4 on 11/04/17. -We will continue with just Herceptin for a total of 1 year, she is a standard recommendation.  Patient is reluctant to complete 1 year of therapy.  After several discussion, we decided to stop after cycle 10.  --We discussed she is eligible for oral her2 antibody Neratinib. I provided her with reading material on this drug for her to consider. She declined this therapy at that time, due to concerns of side effects.  -Given she had radiation for her prior breast cancer she will forego radiation now. -She started Anastrozole on 03/13/18. I discussed she would continue for a minimal of 5 years and if she does well she can do up to 7 years.  --I offered her the chance to attend Survivorship clinic to discuss how to adjust to her new normal post treatment. She is interested. Will set for 2 months from now.  -She will see Dr. Barry Dienes in July, I suggest she can remove  her port in July. I will send a note to Dr. Barry Dienes. -Continue with Anastrozole.  -Next mammogram/US in 05/2018 -F/u in 4 months    2. Bilateral lower extremity edema, L>R, cellulitis in 11/2017  -She received a total of 3 course of antibiotics for her lower extremity cellulitis, is resolved now -Doppler of lower extremities were negative for DVT -Improving. I again  encouraged her to continue wear compression stockings, and elevate her legs. -She saw Dr. Haroldine Laws on 02/08/18 and he does not think her swelling is related to HF or Herceptin. Her Echo was normal and he ordered ABIS to evaluate arterial flow.  -Her swelling is much improved, resolved in her right leg, she will continue to be active, use her compression socks and Lasix.   3. History of invasive and in situ ductal carcinoma of left breast, 10/26/1999, Estrogen receptor negative, stage 1, Grade 3 -Treated in [place]  -s/p Lumpectomy (sentinel node negative, 2-3 removed), chemo, radiation  4. Osteopenia - 03/18/2011, DEXA T = -1.9 -03/27/2014, DEXA  T = -1.4  -I previously encouraged her to take calcium and vitamin D supplement -10/11/2017, DEXA T -0.9 -I previously discussed that an aromatase inhibitor can weaken her bone and we will monitor this  -I recommend she take vitamin D. She is agreeable.      PLAN: -Labs reviewed and adequate to proceed with Herceptin today, will stop after today's treatment, patient is very concerned about potential side effects. -Continue Anastrozole  -Survivorship clinic in 2 months  -Lab and f/u in 4 months  -Mammogram in 3 months at present -Port removal in July, I will send a message to Dr. Barry Dienes   No orders of the defined types were placed in this encounter.  All questions were answered. The patient knows to call the clinic with any problems, questions or concerns. I spent 25 minutes counseling the patient face to face. The total time spent in the appointment was 30 minutes and more than 50% was on counseling.  Oneal Deputy, am acting as scribe for [name], MD.   I have reviewed the above documentation for accuracy and completeness, and I agree with the above.      [name], MD 03/24/18 11:39 AM

## 2018-03-23 NOTE — Progress Notes (Signed)
Hi Shauna, Would you please reach out to Kathleen Hanson [birth year] at [phone]? She has financial questions that she needs some guidance on. Thanks, Bary Castilla, BSN, RN, CN-BN  Called patient and left voicemail to return my call with my contact name and number.

## 2018-03-24 ENCOUNTER — Inpatient Hospital Stay (HOSPITAL_BASED_OUTPATIENT_CLINIC_OR_DEPARTMENT_OTHER): Payer: Medicare HMO | Admitting: Hematology

## 2018-03-24 ENCOUNTER — Inpatient Hospital Stay: Payer: Medicare HMO

## 2018-03-24 ENCOUNTER — Encounter: Payer: Self-pay | Admitting: Hematology

## 2018-03-24 ENCOUNTER — Telehealth: Payer: Self-pay

## 2018-03-24 VITALS — BP 157/69 | HR 58 | Temp 98.4°F | Resp 18 | Ht 64.0 in | Wt 142.0 lb

## 2018-03-24 DIAGNOSIS — R600 Localized edema: Secondary | ICD-10-CM | POA: Diagnosis not present

## 2018-03-24 DIAGNOSIS — C50412 Malignant neoplasm of upper-outer quadrant of left female breast: Secondary | ICD-10-CM

## 2018-03-24 DIAGNOSIS — Z923 Personal history of irradiation: Secondary | ICD-10-CM | POA: Diagnosis not present

## 2018-03-24 DIAGNOSIS — Z803 Family history of malignant neoplasm of breast: Secondary | ICD-10-CM

## 2018-03-24 DIAGNOSIS — Z170 Estrogen receptor positive status [ER+]: Principal | ICD-10-CM

## 2018-03-24 DIAGNOSIS — Z5112 Encounter for antineoplastic immunotherapy: Secondary | ICD-10-CM | POA: Diagnosis not present

## 2018-03-24 DIAGNOSIS — Z1321 Encounter for screening for nutritional disorder: Secondary | ICD-10-CM

## 2018-03-24 DIAGNOSIS — Z9012 Acquired absence of left breast and nipple: Secondary | ICD-10-CM

## 2018-03-24 DIAGNOSIS — M859 Disorder of bone density and structure, unspecified: Secondary | ICD-10-CM

## 2018-03-24 DIAGNOSIS — Z87891 Personal history of nicotine dependence: Secondary | ICD-10-CM

## 2018-03-24 DIAGNOSIS — Z79811 Long term (current) use of aromatase inhibitors: Secondary | ICD-10-CM

## 2018-03-24 LAB — CBC WITH DIFFERENTIAL/PLATELET
BASOS PCT: 1 %
Basophils Absolute: 0 10*3/uL (ref 0.0–0.1)
Eosinophils Absolute: 0.2 10*3/uL (ref 0.0–0.5)
Eosinophils Relative: 4 %
HEMATOCRIT: 38.8 % (ref 34.8–46.6)
HEMOGLOBIN: 13.2 g/dL (ref 11.6–15.9)
LYMPHS ABS: 0.8 10*3/uL — AB (ref 0.9–3.3)
Lymphocytes Relative: 21 %
MCH: 30.5 pg (ref 25.1–34.0)
MCHC: 34.1 g/dL (ref 31.5–36.0)
MCV: 89.5 fL (ref 79.5–101.0)
MONOS PCT: 8 %
Monocytes Absolute: 0.3 10*3/uL (ref 0.1–0.9)
NEUTROS ABS: 2.4 10*3/uL (ref 1.5–6.5)
NEUTROS PCT: 66 %
Platelets: 233 10*3/uL (ref 145–400)
RBC: 4.34 MIL/uL (ref 3.70–5.45)
RDW: 13.8 % (ref 11.2–14.5)
WBC: 3.6 10*3/uL — ABNORMAL LOW (ref 3.9–10.3)

## 2018-03-24 LAB — COMPREHENSIVE METABOLIC PANEL
ALT: 23 U/L (ref 0–55)
ANION GAP: 9 (ref 3–11)
AST: 26 U/L (ref 5–34)
Albumin: 4.1 g/dL (ref 3.5–5.0)
Alkaline Phosphatase: 88 U/L (ref 40–150)
BUN: 14 mg/dL (ref 7–26)
CALCIUM: 9.2 mg/dL (ref 8.4–10.4)
CHLORIDE: 103 mmol/L (ref 98–109)
CO2: 26 mmol/L (ref 22–29)
Creatinine, Ser: 0.6 mg/dL (ref 0.60–1.10)
GFR calc non Af Amer: >60 mL/min (ref >60)
Glucose, Bld: 91 mg/dL (ref 70–140)
Potassium: 4.3 mmol/L (ref 3.5–5.1)
Sodium: 138 mmol/L (ref 136–145)
Total Bilirubin: 0.4 mg/dL (ref 0.2–1.2)
Total Protein: 6.8 g/dL (ref 6.4–8.3)

## 2018-03-24 MED ORDER — DIPHENHYDRAMINE HCL 25 MG PO CAPS
ORAL_CAPSULE | ORAL | Status: AC
  Filled 2018-03-24: qty 2

## 2018-03-24 MED ORDER — SODIUM CHLORIDE 0.9 % IV SOLN
6.0000 mg/kg | Freq: Once | INTRAVENOUS | Status: AC
Start: 2018-03-24 — End: 2018-03-24
  Administered 2018-03-24: 357 mg via INTRAVENOUS
  Filled 2018-03-24: qty 17

## 2018-03-24 MED ORDER — SODIUM CHLORIDE 0.9% FLUSH
10.0000 mL | Freq: Once | INTRAVENOUS | Status: AC
  Administered 2018-03-24: 10 mL
  Filled 2018-03-24: qty 10

## 2018-03-24 MED ORDER — ACETAMINOPHEN 325 MG PO TABS
ORAL_TABLET | ORAL | Status: AC
  Filled 2018-03-24: qty 2

## 2018-03-24 MED ORDER — SODIUM CHLORIDE 0.9 % IV SOLN
Freq: Once | INTRAVENOUS | Status: AC
  Administered 2018-03-24: 13:00:00 via INTRAVENOUS

## 2018-03-24 MED ORDER — ACETAMINOPHEN 325 MG PO TABS
650.0000 mg | ORAL_TABLET | Freq: Once | ORAL | Status: AC
  Administered 2018-03-24: 650 mg via ORAL

## 2018-03-24 MED ORDER — DIPHENHYDRAMINE HCL 25 MG PO CAPS
50.0000 mg | ORAL_CAPSULE | Freq: Once | ORAL | Status: AC
  Administered 2018-03-24: 50 mg via ORAL

## 2018-03-24 MED ORDER — HEPARIN SOD (PORK) LOCK FLUSH 100 UNIT/ML IV SOLN
500.0000 [IU] | Freq: Once | INTRAVENOUS | Status: AC | PRN
  Administered 2018-03-24: 500 [IU]
  Filled 2018-03-24: qty 5

## 2018-03-24 MED ORDER — SODIUM CHLORIDE 0.9% FLUSH
10.0000 mL | INTRAVENOUS | Status: DC | PRN
  Administered 2018-03-24: 10 mL
  Filled 2018-03-24: qty 10

## 2018-03-24 NOTE — Patient Instructions (Signed)
Implanted Port Home Guide An implanted port is a type of central line that is placed under the skin. Central lines are used to provide IV access when treatment or nutrition needs to be given through a person's veins. Implanted ports are used for long-term IV access. An implanted port may be placed because:  You need IV medicine that would be irritating to the small veins in your hands or arms.  You need long-term IV medicines, such as antibiotics.  You need IV nutrition for a long period.  You need frequent blood draws for lab tests.  You need dialysis.  Implanted ports are usually placed in the chest area, but they can also be placed in the upper arm, the abdomen, or the leg. An implanted port has two main parts:  Reservoir. The reservoir is round and will appear as a small, raised area under your skin. The reservoir is the part where a needle is inserted to give medicines or draw blood.  Catheter. The catheter is a thin, flexible tube that extends from the reservoir. The catheter is placed into a large vein. Medicine that is inserted into the reservoir goes into the catheter and then into the vein.  How will I care for my incision site? Do not get the incision site wet. Bathe or shower as directed by your health care provider. How is my port accessed? Special steps must be taken to access the port:  Before the port is accessed, a numbing cream can be placed on the skin. This helps numb the skin over the port site.  Your health care provider uses a sterile technique to access the port. ? Your health care provider must put on a mask and sterile gloves. ? The skin over your port is cleaned carefully with an antiseptic and allowed to dry. ? The port is gently pinched between sterile gloves, and a needle is inserted into the port.  Only "non-coring" port needles should be used to access the port. Once the port is accessed, a blood return should be checked. This helps ensure that the port  is in the vein and is not clogged.  If your port needs to remain accessed for a constant infusion, a clear (transparent) bandage will be placed over the needle site. The bandage and needle will need to be changed every week, or as directed by your health care provider.  Keep the bandage covering the needle clean and dry. Do not get it wet. Follow your health care provider's instructions on how to take a shower or bath while the port is accessed.  If your port does not need to stay accessed, no bandage is needed over the port.  What is flushing? Flushing helps keep the port from getting clogged. Follow your health care provider's instructions on how and when to flush the port. Ports are usually flushed with saline solution or a medicine called heparin. The need for flushing will depend on how the port is used.  If the port is used for intermittent medicines or blood draws, the port will need to be flushed: ? After medicines have been given. ? After blood has been drawn. ? As part of routine maintenance.  If a constant infusion is running, the port may not need to be flushed.  How long will my port stay implanted? The port can stay in for as long as your health care provider thinks it is needed. When it is time for the port to come out, surgery will be   done to remove it. The procedure is similar to the one performed when the port was put in. When should I seek immediate medical care? When you have an implanted port, you should seek immediate medical care if:  You notice a bad smell coming from the incision site.  You have swelling, redness, or drainage at the incision site.  You have more swelling or pain at the port site or the surrounding area.  You have a fever that is not controlled with medicine.  This information is not intended to replace advice given to you by your health care provider. Make sure you discuss any questions you have with your health care provider. Document  Released: 10/11/2005 Document Revised: 03/18/2016 Document Reviewed: 06/18/2013 Elsevier Interactive Patient Education  2017 [employer]  

## 2018-03-24 NOTE — Telephone Encounter (Signed)
Printed avs and calender of upcoming appointment. Per 5/31 los 

## 2018-03-24 NOTE — Patient Instructions (Signed)
[facility] Discharge Instructions for Patients Receiving Chemotherapy  Today you received the following chemotherapy agents Herceptin  To help prevent nausea and vomiting after your treatment, we encourage you to take your nausea medication as directed   If you develop nausea and vomiting that is not controlled by your nausea medication, call the clinic.   BELOW ARE SYMPTOMS THAT SHOULD BE REPORTED IMMEDIATELY:  *FEVER GREATER THAN 100.5 F  *CHILLS WITH OR WITHOUT FEVER  NAUSEA AND VOMITING THAT IS NOT CONTROLLED WITH YOUR NAUSEA MEDICATION  *UNUSUAL SHORTNESS OF BREATH  *UNUSUAL BRUISING OR BLEEDING  TENDERNESS IN MOUTH AND THROAT WITH OR WITHOUT PRESENCE OF ULCERS  *URINARY PROBLEMS  *BOWEL PROBLEMS  UNUSUAL RASH Items with * indicate a potential emergency and should be followed up as soon as possible.  Feel free to call the clinic should you have any questions or concerns. The clinic phone number is [phone].  Please show the CHEMO ALERT CARD at check-in to the Emergency Department and triage nurse.   

## 2018-03-25 ENCOUNTER — Encounter: Payer: Self-pay | Admitting: Hematology

## 2018-04-04 ENCOUNTER — Telehealth: Payer: Self-pay | Admitting: *Deleted

## 2018-04-04 NOTE — Telephone Encounter (Signed)
"  Looking over MyChart.  There is not any information about my treatments.  Treated last on May 31st.  Where can I obtain this information?  I'm looking at the AVS and Web."  EPIC MyChart results tab shows dates results releasd and read.  AVS shows treatment information.  Denies treatment information.  Talked through on-line visits for past appointment AVS and notes.  Denies any notes or the AVS section with "Administrations this visit" above section for patient discharge instructions.   Provided My Chart Support number [phone]) with this call for further assistance with this requests.  Denies further needs or questions of triage at this time.

## 2018-04-14 ENCOUNTER — Other Ambulatory Visit: Payer: Medicare HMO

## 2018-04-14 ENCOUNTER — Ambulatory Visit: Payer: Medicare HMO

## 2018-04-14 ENCOUNTER — Ambulatory Visit: Payer: Medicare HMO | Admitting: Hematology

## 2018-05-01 ENCOUNTER — Other Ambulatory Visit: Payer: Self-pay | Admitting: General Surgery

## 2018-05-05 ENCOUNTER — Ambulatory Visit: Payer: Medicare HMO | Admitting: Hematology

## 2018-05-05 ENCOUNTER — Encounter (HOSPITAL_BASED_OUTPATIENT_CLINIC_OR_DEPARTMENT_OTHER): Payer: Self-pay | Admitting: *Deleted

## 2018-05-05 ENCOUNTER — Other Ambulatory Visit: Payer: Self-pay

## 2018-05-05 ENCOUNTER — Other Ambulatory Visit: Payer: Medicare HMO

## 2018-05-05 ENCOUNTER — Ambulatory Visit: Payer: Medicare HMO

## 2018-05-10 ENCOUNTER — Telehealth: Payer: Self-pay

## 2018-05-10 ENCOUNTER — Telehealth: Payer: Self-pay | Admitting: *Deleted

## 2018-05-10 NOTE — Telephone Encounter (Signed)
Call received from [place] [phone]). "I was told to expect hair and nails to return to normal while on Herceptin when I completed chemotherapy back in January.  My hair is growing slowly but my nails that fell off have not.  They even look black.  They change looking different with leg swelling to LE.  I was told I have vascular disease in legs.  No redness, open areas or drainage, pain or swelling to my toes.  I wear open toe shoes daily.  Do I need to do any special cleaning or treatment to my toes or should I see a Podiatrist?"  Will notify provider for any further recommendations or orders.  Nurse instructions included hair grows out within six months but toe nails could take a year to grow out or longer with reported peripheral vascular disease.  Be patient, take good care of feet cleaning, dry well, no lotion between toes, call if any changes or apparent damage or symptoms occur.  Can try foot soak as suggested during treatment mixing warm water, dish soap, peroxide and listerine twice daily.

## 2018-05-10 NOTE — Telephone Encounter (Signed)
Lacie, could you give her a call back tomorrow? Thanks much.   [name] MD

## 2018-05-10 NOTE — Telephone Encounter (Signed)
LVM reminding pt of SCP visit with NP on 05/18/18 at 2 pm. Left center number on vm for callback with questions.

## 2018-05-11 ENCOUNTER — Encounter (HOSPITAL_BASED_OUTPATIENT_CLINIC_OR_DEPARTMENT_OTHER)
Admission: RE | Disposition: A | Discharge status: Home / Self Care | Payer: Self-pay | Source: Ambulatory Visit | Attending: General Surgery

## 2018-05-11 ENCOUNTER — Encounter (HOSPITAL_BASED_OUTPATIENT_CLINIC_OR_DEPARTMENT_OTHER): Payer: Self-pay | Admitting: Emergency Medicine

## 2018-05-11 ENCOUNTER — Ambulatory Visit (HOSPITAL_BASED_OUTPATIENT_CLINIC_OR_DEPARTMENT_OTHER)
Admission: RE | Admit: 2018-05-11 | Discharge: 2018-05-11 | Disposition: A | Discharge status: Home / Self Care | Payer: Medicare HMO | Source: Ambulatory Visit | Attending: General Surgery | Admitting: General Surgery

## 2018-05-11 ENCOUNTER — Ambulatory Visit (HOSPITAL_BASED_OUTPATIENT_CLINIC_OR_DEPARTMENT_OTHER): Payer: Medicare HMO | Admitting: Anesthesiology

## 2018-05-11 ENCOUNTER — Other Ambulatory Visit: Payer: Self-pay

## 2018-05-11 DIAGNOSIS — Z9012 Acquired absence of left breast and nipple: Secondary | ICD-10-CM | POA: Diagnosis not present

## 2018-05-11 DIAGNOSIS — Z79899 Other long term (current) drug therapy: Secondary | ICD-10-CM | POA: Diagnosis not present

## 2018-05-11 DIAGNOSIS — E785 Hyperlipidemia, unspecified: Secondary | ICD-10-CM | POA: Diagnosis not present

## 2018-05-11 DIAGNOSIS — Z8052 Family history of malignant neoplasm of bladder: Secondary | ICD-10-CM | POA: Diagnosis not present

## 2018-05-11 DIAGNOSIS — G43809 Other migraine, not intractable, without status migrainosus: Secondary | ICD-10-CM | POA: Diagnosis not present

## 2018-05-11 DIAGNOSIS — Z452 Encounter for adjustment and management of vascular access device: Secondary | ICD-10-CM | POA: Insufficient documentation

## 2018-05-11 DIAGNOSIS — C50912 Malignant neoplasm of unspecified site of left female breast: Secondary | ICD-10-CM | POA: Diagnosis not present

## 2018-05-11 DIAGNOSIS — Z9889 Other specified postprocedural states: Secondary | ICD-10-CM | POA: Diagnosis not present

## 2018-05-11 DIAGNOSIS — Z87891 Personal history of nicotine dependence: Secondary | ICD-10-CM | POA: Diagnosis not present

## 2018-05-11 DIAGNOSIS — Z803 Family history of malignant neoplasm of breast: Secondary | ICD-10-CM | POA: Insufficient documentation

## 2018-05-11 HISTORY — PX: PORT-A-CATH REMOVAL: SHX5289

## 2018-05-11 LAB — POCT I-STAT, CHEM 8
BUN: 11 mg/dL (ref 8–23)
CALCIUM ION: 1.24 mmol/L (ref 1.15–1.40)
Chloride: 98 mmol/L (ref 98–111)
Creatinine, Ser: 0.5 mg/dL (ref 0.44–1.00)
GLUCOSE: 97 mg/dL (ref 70–99)
HCT: 40 % (ref 36.0–46.0)
Hemoglobin: 13.6 g/dL (ref 12.0–15.0)
Potassium: 4.8 mmol/L (ref 3.5–5.1)
SODIUM: 138 mmol/L (ref 135–145)
TCO2: 28 mmol/L (ref 22–32)

## 2018-05-11 SURGERY — REMOVAL PORT-A-CATH
Anesthesia: Monitor Anesthesia Care | Site: Chest

## 2018-05-11 MED ORDER — ACETAMINOPHEN 500 MG PO TABS
1000.0000 mg | ORAL_TABLET | ORAL | Status: AC
  Administered 2018-05-11: 1000 mg via ORAL

## 2018-05-11 MED ORDER — GABAPENTIN 300 MG PO CAPS
300.0000 mg | ORAL_CAPSULE | ORAL | Status: AC
  Administered 2018-05-11: 300 mg via ORAL

## 2018-05-11 MED ORDER — ONDANSETRON HCL 4 MG/2ML IJ SOLN
INTRAMUSCULAR | Status: DC | PRN
  Administered 2018-05-11: 4 mg via INTRAVENOUS

## 2018-05-11 MED ORDER — LACTATED RINGERS IV SOLN
INTRAVENOUS | Status: DC
  Administered 2018-05-11: 11:00:00 via INTRAVENOUS

## 2018-05-11 MED ORDER — FENTANYL CITRATE (PF) 100 MCG/2ML IJ SOLN: 25.0000 ug | INTRAMUSCULAR | Status: DC | PRN

## 2018-05-11 MED ORDER — TRAMADOL HCL 50 MG PO TABS: 50.0000 mg | ORAL_TABLET | Freq: Four times a day (QID) | ORAL | 0 refills | Status: DC | PRN

## 2018-05-11 MED ORDER — HYDROCODONE-ACETAMINOPHEN 7.5-325 MG PO TABS: 1.0000 | ORAL_TABLET | Freq: Once | ORAL | Status: DC | PRN

## 2018-05-11 MED ORDER — SCOPOLAMINE 1 MG/3DAYS TD PT72: 1.0000 | MEDICATED_PATCH | Freq: Once | TRANSDERMAL | Status: DC | PRN

## 2018-05-11 MED ORDER — PROPOFOL 500 MG/50ML IV EMUL
INTRAVENOUS | Status: AC
  Filled 2018-05-11: qty 50

## 2018-05-11 MED ORDER — FENTANYL CITRATE (PF) 100 MCG/2ML IJ SOLN
50.0000 ug | INTRAMUSCULAR | Status: DC | PRN
  Administered 2018-05-11: 100 ug via INTRAVENOUS

## 2018-05-11 MED ORDER — BUPIVACAINE HCL (PF) 0.25 % IJ SOLN
INTRAMUSCULAR | Status: AC
  Filled 2018-05-11: qty 30

## 2018-05-11 MED ORDER — CEFAZOLIN SODIUM-DEXTROSE 2-4 GM/100ML-% IV SOLN
INTRAVENOUS | Status: AC
  Filled 2018-05-11: qty 100

## 2018-05-11 MED ORDER — GABAPENTIN 300 MG PO CAPS
ORAL_CAPSULE | ORAL | Status: AC
  Filled 2018-05-11: qty 1

## 2018-05-11 MED ORDER — CHLORHEXIDINE GLUCONATE CLOTH 2 % EX PADS: 6.0000 | MEDICATED_PAD | Freq: Once | CUTANEOUS | Status: DC

## 2018-05-11 MED ORDER — LIDOCAINE HCL (PF) 1 % IJ SOLN
INTRAMUSCULAR | Status: AC
  Filled 2018-05-11: qty 30

## 2018-05-11 MED ORDER — ACETAMINOPHEN 500 MG PO TABS
ORAL_TABLET | ORAL | Status: AC
  Filled 2018-05-11: qty 2

## 2018-05-11 MED ORDER — BUPIVACAINE-EPINEPHRINE (PF) 0.5% -1:200000 IJ SOLN
INTRAMUSCULAR | Status: AC
  Filled 2018-05-11: qty 30

## 2018-05-11 MED ORDER — LIDOCAINE HCL 1 % IJ SOLN
INTRAMUSCULAR | Status: DC | PRN
  Administered 2018-05-11: 6 mL via INTRAMUSCULAR

## 2018-05-11 MED ORDER — PROPOFOL 10 MG/ML IV BOLUS
INTRAVENOUS | Status: DC | PRN
  Administered 2018-05-11 (×2): 10 mg via INTRAVENOUS

## 2018-05-11 MED ORDER — PROPOFOL 500 MG/50ML IV EMUL
INTRAVENOUS | Status: DC | PRN
  Administered 2018-05-11: 50 ug/kg/min via INTRAVENOUS

## 2018-05-11 MED ORDER — CEFAZOLIN SODIUM-DEXTROSE 2-4 GM/100ML-% IV SOLN
2.0000 g | INTRAVENOUS | Status: AC
  Administered 2018-05-11: 2 g via INTRAVENOUS

## 2018-05-11 MED ORDER — LIDOCAINE-EPINEPHRINE (PF) 1 %-1:200000 IJ SOLN
INTRAMUSCULAR | Status: AC
  Filled 2018-05-11: qty 30

## 2018-05-11 MED ORDER — MEPERIDINE HCL 25 MG/ML IJ SOLN: 6.2500 mg | INTRAMUSCULAR | Status: DC | PRN

## 2018-05-11 MED ORDER — MIDAZOLAM HCL 2 MG/2ML IJ SOLN: 1.0000 mg | INTRAMUSCULAR | Status: DC | PRN

## 2018-05-11 MED ORDER — FENTANYL CITRATE (PF) 100 MCG/2ML IJ SOLN
INTRAMUSCULAR | Status: AC
  Filled 2018-05-11: qty 2

## 2018-05-11 MED ORDER — ONDANSETRON HCL 4 MG/2ML IJ SOLN: 4.0000 mg | Freq: Once | INTRAMUSCULAR | Status: DC | PRN

## 2018-05-11 SURGICAL SUPPLY — 37 items
BLADE HEX COATED 2.75 (ELECTRODE) ×3 IMPLANT
BLADE SURG 15 STRL LF DISP TIS (BLADE) ×1 IMPLANT
BLADE SURG 15 STRL SS (BLADE) ×2
CANISTER SUCT 1200ML W/VALVE (MISCELLANEOUS) IMPLANT
CHLORAPREP W/TINT 26ML (MISCELLANEOUS) ×3 IMPLANT
COVER BACK TABLE 60X90IN (DRAPES) ×3 IMPLANT
COVER MAYO STAND STRL (DRAPES) ×3 IMPLANT
DECANTER SPIKE VIAL GLASS SM (MISCELLANEOUS) IMPLANT
DERMABOND ADVANCED (GAUZE/BANDAGES/DRESSINGS) ×2
DERMABOND ADVANCED .7 DNX12 (GAUZE/BANDAGES/DRESSINGS) ×1 IMPLANT
DRAPE LAPAROTOMY 100X72 PEDS (DRAPES) ×3 IMPLANT
DRAPE UTILITY XL STRL (DRAPES) ×3 IMPLANT
ELECT REM PT RETURN 9FT ADLT (ELECTROSURGICAL) ×3
ELECTRODE REM PT RTRN 9FT ADLT (ELECTROSURGICAL) ×1 IMPLANT
GLOVE BIO SURGEON STRL SZ 6 (GLOVE) ×3 IMPLANT
GLOVE BIO SURGEON STRL SZ 6.5 (GLOVE) ×2 IMPLANT
GLOVE BIO SURGEONS STRL SZ 6.5 (GLOVE) ×1
GLOVE BIOGEL PI IND STRL 6.5 (GLOVE) ×1 IMPLANT
GLOVE BIOGEL PI IND STRL 7.0 (GLOVE) ×1 IMPLANT
GLOVE BIOGEL PI INDICATOR 6.5 (GLOVE) ×2
GLOVE BIOGEL PI INDICATOR 7.0 (GLOVE) ×2
GOWN STRL REUS W/ TWL LRG LVL3 (GOWN DISPOSABLE) ×1 IMPLANT
GOWN STRL REUS W/TWL 2XL LVL3 (GOWN DISPOSABLE) ×3 IMPLANT
GOWN STRL REUS W/TWL LRG LVL3 (GOWN DISPOSABLE) ×2
NEEDLE HYPO 25X1 1.5 SAFETY (NEEDLE) ×3 IMPLANT
NS IRRIG 1000ML POUR BTL (IV SOLUTION) IMPLANT
PACK BASIN DAY SURGERY FS (CUSTOM PROCEDURE TRAY) ×3 IMPLANT
PENCIL BUTTON HOLSTER BLD 10FT (ELECTRODE) ×3 IMPLANT
SUT MNCRL AB 4-0 PS2 18 (SUTURE) ×3 IMPLANT
SUT VIC AB 3-0 SH 27 (SUTURE) ×2
SUT VIC AB 3-0 SH 27X BRD (SUTURE) ×1 IMPLANT
SYR CONTROL 10ML LL (SYRINGE) ×3 IMPLANT
TOWEL GREEN STERILE FF (TOWEL DISPOSABLE) ×3 IMPLANT
TOWEL OR NON WOVEN STRL DISP B (DISPOSABLE) ×3 IMPLANT
TUBE CONNECTING 20'X1/4 (TUBING)
TUBE CONNECTING 20X1/4 (TUBING) IMPLANT
YANKAUER SUCT BULB TIP NO VENT (SUCTIONS) IMPLANT

## 2018-05-11 NOTE — H&P (Signed)
Kathleen Hanson is an 82 y.o. female.   Chief Complaint: left breast cancer HPI:  Pt is a 82 yo F s/p left mastectomy/SLN bx and port placement.  She has received chemo and is ready for port removal.    Past Medical History:  Diagnosis Date  . Cancer of left breast ([place])   . Constipation   . Family history of breast cancer   . Fibrocystic breast   . Hyperlipidemia   . Ocular migraine    "used to have them monthly for a period of time; seemed to stop; now have had a couple in the last week" (07/13/2017)    Past Surgical History:  Procedure Laterality Date  . BREAST BIOPSY Right ~ 2001   BREAST EXCISIONAL BIOPSY  . BREAST LUMPECTOMY Left 2001   chemo and radiation  . CESAREAN SECTION  X 1  . IR CATHETER TUBE CHANGE  07/20/2017  . MASTECTOMY COMPLETE / SIMPLE W/ SENTINEL NODE BIOPSY Left 07/13/2017    LEFT MASTECTOMY WITH SENTINEL NODE MAPPING ERAS PATHWAY [name] 07/13/2017  . MASTECTOMY W/ SENTINEL NODE BIOPSY Left 07/13/2017   Procedure: LEFT MASTECTOMY WITH SENTINEL NODE MAPPING ERAS PATHWAY;  Surgeon: [name], MD;  Location: [facility];  Service: General;  Laterality: Left;  . PORTACATH PLACEMENT Right 07/13/2017  . PORTACATH PLACEMENT Right 07/13/2017   Procedure: INSERTION PORT-A-CATH;  Surgeon: [name], MD;  Location: [facility];  Service: General;  Laterality: Right;  . TONSILLECTOMY      Family History  Problem Relation Age of Onset  . Cancer Father        bladder cancer  . Breast cancer Sister 50   Social History:  reports that she quit smoking about 43 years ago. Her smoking use included cigarettes. She has a 7.50 pack-year smoking history. She has never used smokeless tobacco. She reports that she drinks about 3.0 oz of alcohol per week. She reports that she does not use drugs.  Allergies: No Known Allergies  Medications Prior to Admission  Medication Sig Dispense Refill  . calcium-vitamin D (OSCAL WITH D) 500-200 MG-UNIT tablet Take 1 tablet by mouth.    .  Cholecalciferol (VITAMIN D3) 2000 units TABS Take by mouth.    . fluticasone (FLONASE) 50 MCG/ACT nasal spray Place 2 sprays into the nose daily as needed.    . furosemide (LASIX) 20 MG tablet Take 1 tablet (20 mg total) by mouth daily. 10 tablet 0  . Multiple Vitamin (MULTIVITAMIN WITH MINERALS) TABS tablet Take 1 tablet by mouth daily.    . polyethylene glycol powder (GLYCOLAX/MIRALAX) powder MIX 17 GRAMS IN LIQUID AND DRINK DAILY IN THE EVENING    . potassium chloride SA (K-DUR,KLOR-CON) 20 MEQ tablet Take 1 tablet (20 mEq total) by mouth 2 (two) times daily. 10 tablet 0  . simvastatin (ZOCOR) 40 MG tablet Take 40 mg daily by mouth.    . vitamin C (ASCORBIC ACID) 500 MG tablet Take 500 mg by mouth daily.    Marland Kitchen lidocaine-prilocaine (EMLA) cream Apply to affected area once 30 g 3    Results for orders placed or performed during the hospital encounter of 05/11/18 (from the past 48 hour(s))  I-STAT, chem 8     Status: None   Collection Time: 05/11/18 10:58 AM  Result Value Ref Range   Sodium 138 135 - 145 mmol/L   Potassium 4.8 3.5 - 5.1 mmol/L   Chloride 98 98 - 111 mmol/L   BUN 11 8 -  23 mg/dL   Creatinine, Ser 0.50 0.44 - 1.00 mg/dL   Glucose, Bld 97 70 - 99 mg/dL   Calcium, Ion 1.24 1.15 - 1.40 mmol/L   TCO2 28 22 - 32 mmol/L   Hemoglobin 13.6 12.0 - 15.0 g/dL   HCT 40.0 36.0 - 46.0 %   No results found.  Review of Systems  All other systems reviewed and are negative.   Blood pressure (!) 166/58, pulse (!) 57, temperature 97.9 F (36.6 C), temperature source Oral, resp. rate 16, height 5\' 4"  (1.626 m), weight 63.2 kg (139 lb 6.4 oz), SpO2 100 %. Physical Exam  Constitutional: She is oriented to person, place, and time. She appears well-developed and well-nourished.  HENT:  Head: Normocephalic.  Eyes: Pupils are equal, round, and reactive to light.  Neck: Neck supple.  Palpable right sided port catheter  Cardiovascular: Normal rate.  Respiratory: Effort normal. No  respiratory distress.  GI: Soft.  Musculoskeletal: Normal range of motion.  Neurological: She is alert and oriented to person, place, and time.  Skin: Skin is warm and dry.  Psychiatric: She has a normal mood and affect. Her behavior is normal. Judgment and thought content normal.     Assessment/Plan Left breast cancer, port in place.  Remove port.   Procedure reviewed, risks discussed.    Stark Klein, MD 05/11/2018, 11:59 AM

## 2018-05-11 NOTE — Anesthesia Preprocedure Evaluation (Signed)
Anesthesia Evaluation  Patient identified by MRN, date of birth, ID band Patient awake    Reviewed: Allergy & Precautions, NPO status , Patient's Chart, lab work & pertinent test results  Airway Mallampati: II  TM Distance: >3 FB Neck ROM: Full    Dental  (+) Partial Upper,    Pulmonary former smoker,    Pulmonary exam normal breath sounds clear to auscultation       Cardiovascular negative cardio ROS Normal cardiovascular exam Rhythm:Regular Rate:Normal     Neuro/Psych  Headaches, negative psych ROS   GI/Hepatic negative GI ROS, Neg liver ROS,   Endo/Other  Hyperlipidemia Hx/o Breast Ca  Renal/GU negative Renal ROS  negative genitourinary   Musculoskeletal negative musculoskeletal ROS (+)   Abdominal   Peds  Hematology negative hematology ROS (+)   Anesthesia Other Findings   Reproductive/Obstetrics                            Anesthesia Physical Anesthesia Plan  ASA: II  Anesthesia Plan: MAC   Post-op Pain Management:    Induction: Intravenous  PONV Risk Score and Plan: 2 and Ondansetron, Dexamethasone, Treatment may vary due to age or medical condition and Propofol infusion  Airway Management Planned: Natural Airway and Simple Face Mask  Additional Equipment:   Intra-op Plan:   Post-operative Plan:   Informed Consent: I have reviewed the patients History and Physical, chart, labs and discussed the procedure including the risks, benefits and alternatives for the proposed anesthesia with the patient or authorized representative who has indicated his/her understanding and acceptance.   Dental advisory given  Plan Discussed with: CRNA and Surgeon  Anesthesia Plan Comments:         Anesthesia Quick Evaluation

## 2018-05-11 NOTE — Discharge Instructions (Addendum)
[facility] Office Phone Number [phone]   POST OP INSTRUCTIONS  Always review your discharge instruction sheet given to you by the facility where your surgery was performed.  IF YOU HAVE DISABILITY OR FAMILY LEAVE FORMS, YOU MUST BRING THEM TO THE OFFICE FOR PROCESSING.  DO NOT GIVE THEM TO YOUR DOCTOR.  1. A prescription for pain medication may be given to you upon discharge.  Take your pain medication as prescribed, if needed.  If narcotic pain medicine is not needed, then you may take acetaminophen (Tylenol) or ibuprofen (Advil) as needed. 2. Take your usually prescribed medications unless otherwise directed 3. If you need a refill on your pain medication, please contact your pharmacy.  They will contact our office to request authorization.  Prescriptions will not be filled after 5pm or on week-ends. 4. You should eat very light the first 24 hours after surgery, such as soup, crackers, pudding, etc.  Resume your normal diet the day after surgery 5. It is common to experience some constipation if taking pain medication after surgery.  Increasing fluid intake and taking a stool softener will usually help or prevent this problem from occurring.  A mild laxative (Milk of Magnesia or Miralax) should be taken according to package directions if there are no bowel movements after 48 hours. 6. You may shower in 48 hours.  The surgical glue will flake off in 2-3 weeks.   7. ACTIVITIES:  No strenuous activity or heavy lifting for 1 week.   a. You may drive when you no longer are taking prescription pain medication, you can comfortably wear a seatbelt, and you can safely maneuver your car and apply brakes. b. RETURN TO WORK:  __________n/a_______________ You should see your doctor in the office for a follow-up appointment approximately three-four weeks after your surgery.    WHEN TO CALL YOUR DOCTOR: 1. Fever over 101.0 2. Nausea and/or vomiting. 3. Extreme swelling or  bruising. 4. Continued bleeding from incision. 5. Increased pain, redness, or drainage from the incision.  The clinic staff is available to answer your questions during regular business hours.  Please don't hesitate to call and ask to speak to one of the nurses for clinical concerns.  If you have a medical emergency, go to the nearest emergency room or call 911.  A surgeon from [facility] is always on call at the hospital.  For further questions, please visit [email or web address]     Post Anesthesia Home Care Instructions  Activity: Get plenty of rest for the remainder of the day. A responsible individual must stay with you for 24 hours following the procedure.  For the next 24 hours, DO NOT: -Drive a car -Operate machinery -Drink alcoholic beverages -Take any medication unless instructed by your physician -Make any legal decisions or sign important papers.  Meals: Start with liquid foods such as gelatin or soup. Progress to regular foods as tolerated. Avoid greasy, spicy, heavy foods. If nausea and/or vomiting occur, drink only clear liquids until the nausea and/or vomiting subsides. Call your physician if vomiting continues.  Special Instructions/Symptoms: Your throat may feel dry or sore from the anesthesia or the breathing tube placed in your throat during surgery. If this causes discomfort, gargle with warm salt water. The discomfort should disappear within 24 hours.  If you had a scopolamine patch placed behind your ear for the management of post- operative nausea and/or vomiting:  1. The medication in the patch is effective for 72 hours, after which it should   be removed.  Wrap patch in a tissue and discard in the trash. Wash hands thoroughly with soap and water. 2. You may remove the patch earlier than 72 hours if you experience unpleasant side effects which may include dry mouth, dizziness or visual disturbances. 3. Avoid touching the patch. Wash your hands  with soap and water after contact with the patch.      

## 2018-05-11 NOTE — Op Note (Signed)
  PRE-OPERATIVE DIAGNOSIS:  un-needed Port-A-Cath for left breast cancer  POST-OPERATIVE DIAGNOSIS:  Same   PROCEDURE:  Procedure(s):  REMOVAL PORT-A-CATH  SURGEON:  Surgeon(s):  Stark Klein, MD  ANESTHESIA:   MAC + local  EBL:   Minimal  SPECIMEN:  None  Complications : none known  Procedure:   Pt was  identified in the holding area and taken to the operating room where she was placed supine on the operating room table.  MAC anesthesia was induced.  The right upper chest was prepped and draped.  The prior incision was anesthetized with local anesthetic.  The incision was opened with a #15 blade.  The subcutaneous tissue was divided with the cautery.  The port was identified and the capsule opened.  The two 2-0 prolene sutures were removed.  The port was then removed and pressure held on the tract.  The catheter appeared intact without evidence of breakage, length was 25.5.  The wound was inspected for hemostasis, which was achieved with cautery.  The wound was closed with 3-0 vicryl deep dermal interrupted sutures and 4-0 Monocryl running subcuticular suture.  The wound was cleaned, dried, and dressed with dermabond.  The patient was awakened from anesthesia and taken to the PACU in stable condition.  Needle, sponge, and instrument counts are correct.

## 2018-05-11 NOTE — Transfer of Care (Signed)
Immediate Anesthesia Transfer of Care Note  Patient: Kathleen Hanson [facility]  Procedure(s) Performed: REMOVAL PORT-A-CATH (N/A Chest)  Patient Location: PACU  Anesthesia Type:MAC  Level of Consciousness: sedated  Airway & Oxygen Therapy: Patient Spontanous Breathing and Patient connected to face mask oxygen  Post-op Assessment: Report given to RN and Post -op Vital signs reviewed and stable  Post vital signs: Reviewed and stable  Last Vitals:  Vitals Value Taken Time  BP    Temp    Pulse 62 05/11/2018 12:47 PM  Resp 14 05/11/2018 12:47 PM  SpO2 100 % 05/11/2018 12:47 PM  Vitals shown include unvalidated device data.  Last Pain:  Vitals:   05/11/18 1044  TempSrc: Oral         Complications: No apparent anesthesia complications

## 2018-05-11 NOTE — Anesthesia Postprocedure Evaluation (Signed)
Anesthesia Post Note  Patient: Kathleen Hanson  Procedure(s) Performed: REMOVAL PORT-A-CATH (N/A Chest)     Patient location during evaluation: PACU Anesthesia Type: MAC Level of consciousness: awake and alert and oriented Pain management: pain level controlled Vital Signs Assessment: post-procedure vital signs reviewed and stable Respiratory status: spontaneous breathing, nonlabored ventilation and respiratory function stable Cardiovascular status: stable and blood pressure returned to baseline Postop Assessment: no apparent nausea or vomiting Anesthetic complications: no    Last Vitals:  Vitals:   05/11/18 1044 05/11/18 1245  BP: (!) 166/58 124/62  Pulse: (!) 57 (!) 58  Resp: 16 11  Temp: 36.6 C 36.5 C  SpO2: 100% 100%    Last Pain:  Vitals:   05/11/18 1245  TempSrc:   PainSc: 0-No pain                 Aaliyan Brinkmeier A.

## 2018-05-12 ENCOUNTER — Encounter (HOSPITAL_BASED_OUTPATIENT_CLINIC_OR_DEPARTMENT_OTHER): Payer: Self-pay | Admitting: General Surgery

## 2018-05-15 ENCOUNTER — Ambulatory Visit: Payer: Medicare HMO | Attending: General Surgery | Admitting: Rehabilitation

## 2018-05-15 ENCOUNTER — Encounter: Payer: Self-pay | Admitting: Rehabilitation

## 2018-05-15 ENCOUNTER — Telehealth: Payer: Self-pay | Admitting: Nurse Practitioner

## 2018-05-15 ENCOUNTER — Other Ambulatory Visit: Payer: Self-pay

## 2018-05-15 DIAGNOSIS — M25612 Stiffness of left shoulder, not elsewhere classified: Secondary | ICD-10-CM | POA: Insufficient documentation

## 2018-05-15 DIAGNOSIS — R293 Abnormal posture: Secondary | ICD-10-CM | POA: Diagnosis present

## 2018-05-15 DIAGNOSIS — C50412 Malignant neoplasm of upper-outer quadrant of left female breast: Secondary | ICD-10-CM | POA: Diagnosis present

## 2018-05-15 DIAGNOSIS — Z9189 Other specified personal risk factors, not elsewhere classified: Secondary | ICD-10-CM | POA: Diagnosis present

## 2018-05-15 DIAGNOSIS — I972 Postmastectomy lymphedema syndrome: Secondary | ICD-10-CM

## 2018-05-15 DIAGNOSIS — Z170 Estrogen receptor positive status [ER+]: Secondary | ICD-10-CM | POA: Diagnosis present

## 2018-05-15 NOTE — Therapy (Signed)
[place], [place], [address] Phone: [phone]   Fax:  [phone]  Physical Therapy Evaluation  Patient Details  Name: Kathleen Hanson MRN: [id number] Date of Birth: 02/11/1936 Referring Provider: Dr. Stark Klein   Encounter Date: 05/15/2018  PT End of Session - 05/15/18 2118    Visit Number  1    Number of Visits  9    Date for PT Re-Evaluation  06/05/18    PT Start Time  1615    PT Stop Time  1706    PT Time Calculation (min)  51 min    Activity Tolerance  Patient tolerated treatment well    Behavior During Therapy  [facility] for tasks assessed/performed       Past Medical History:  Diagnosis Date  . Cancer of left breast ([place])   . Constipation   . Family history of breast cancer   . Fibrocystic breast   . Hyperlipidemia   . Ocular migraine    "used to have them monthly for a period of time; seemed to stop; now have had a couple in the last week" (07/13/2017)    Past Surgical History:  Procedure Laterality Date  . BREAST BIOPSY Right ~ 2001   BREAST EXCISIONAL BIOPSY  . BREAST LUMPECTOMY Left 2001   chemo and radiation  . CESAREAN SECTION  X 1  . IR CATHETER TUBE CHANGE  07/20/2017  . MASTECTOMY COMPLETE / SIMPLE W/ SENTINEL NODE BIOPSY Left 07/13/2017    LEFT MASTECTOMY WITH SENTINEL NODE MAPPING ERAS PATHWAY [name] 07/13/2017  . MASTECTOMY W/ SENTINEL NODE BIOPSY Left 07/13/2017   Procedure: LEFT MASTECTOMY WITH SENTINEL NODE MAPPING ERAS PATHWAY;  Surgeon: [name], MD;  Location: [place];  Service: General;  Laterality: Left;  . PORT-A-CATH REMOVAL N/A 05/11/2018   Procedure: REMOVAL PORT-A-CATH;  Surgeon: Stark Klein, MD;  Location: [place];  Service: General;  Laterality: N/A;  . PORTACATH PLACEMENT Right 07/13/2017  . PORTACATH PLACEMENT Right 07/13/2017   Procedure: INSERTION PORT-A-CATH;  Surgeon: [name], MD;  Location: [place];  Service: General;   Laterality: Right;  . TONSILLECTOMY      There were no vitals filed for this visit.   Subjective Assessment - 05/15/18 1624    Subjective  Pt presents with complaints of Lt UE swelling the past 2 months or so.  Unable to wear her watch.      Pertinent History  Lt lumpectomy 2001 with chemo and radiation and then mastectomy 2018 with SLNB 0/2 with chemotherapy again.  Now just on anastrozole.  high cholesterol    Patient Stated Goals  learn what to do for the UE    Currently in Pain?  No/denies         [facility] PT Assessment - 05/15/18 0001      Assessment   Medical Diagnosis  Lt mastectomy/UE lymphedema    Referring Provider  Dr. Stark Klein    Onset Date/Surgical Date  02/22/18    Hand Dominance  Right    Next MD Visit  unknown    Prior Therapy  no      Precautions   Precaution Comments  cancer, lymphedema      Restrictions   Weight Bearing Restrictions  No      Balance Screen   Has the patient fallen in the past 6 months  No    Has the patient had a decrease in activity level because of a  fear of falling?   No    Is the patient reluctant to leave their home because of a fear of falling?   No      Home Environment   Living Environment  Private residence    Living Arrangements  Spouse/significant other    Available Help at Discharge  Family      Prior Function   Level of [place]  Retired    Leisure  walking group ; 45min to 1 hour 1 day per week       Cognition   Overall Cognitive Status  Within Functional Limits for tasks assessed      Coordination   Gross Motor Movements are Fluid and Coordinated  Yes        LYMPHEDEMA/ONCOLOGY QUESTIONNAIRE - 05/15/18 1622      Type   Cancer Type  s/p left mastectomy SLNB      Surgeries   Mastectomy Date  07/13/17    Sentinel Lymph Node Biopsy Date  07/13/17    Number Lymph Nodes Removed  2      Treatment   Past Chemotherapy Treatment  No    Active Radiation Treatment  No    Past  Radiation Treatment  Yes    Date  10/26/99    Body Site  left breast    Current Hormone Treatment  Yes    Drug Name  anastrazole      What other symptoms do you have   Are you Having Heaviness or Tightness  Yes    Are you having Pain  No    Are you having pitting edema  Yes    Is it Hard or Difficult finding clothes that fit  No    Do you have infections  No    Stemmer Sign  No      Lymphedema Assessments   Lymphedema Assessments  Upper extremities      Right Upper Extremity Lymphedema   15 cm Proximal to Olecranon Process  28 cm    10 cm Proximal to Olecranon Process  26 cm    Olecranon Process  24 cm    15 cm Proximal to Ulnar Styloid Process  19.8 cm    10 cm Proximal to Ulnar Styloid Process  16.8 cm    Just Proximal to Ulnar Styloid Process  15 cm    Across Hand at [employer]  18 cm    At [place] of 2nd Digit  6.5 cm      Left Upper Extremity Lymphedema   15 cm Proximal to Olecranon Process  28.8 cm    10 cm Proximal to Olecranon Process  27.8 cm    Olecranon Process  27.8 cm    15 cm Proximal to Ulnar Styloid Process  24 cm    10 cm Proximal to Ulnar Styloid Process  19.8 cm    Just Proximal to Ulnar Styloid Process  16.3 cm    Across Hand at [employer]  18.5 cm    At [place] of 2nd Digit  6.1 cm    Other  9             Outpatient Rehab from [facility]15/2018 [facility]  Lymphedema Life Impact Scale Total Score  10.29 %      Objective measurements completed on examination: See above findings.      [place] Adult PT Treatment/Exercise - 05/15/18 0001      Manual  Therapy   Manual Therapy  Edema management             PT Education - 05/15/18 2117    Education Details  PT POC    Person(s) Educated  Patient    Methods  Explanation          PT Long Term Goals - 05/15/18 2126      PT LONG TERM GOAL #1   Title  Pt will be ind with self MLD for continued care    Time  3    Period  Weeks    Status  New     Target Date  06/05/18      PT LONG TERM GOAL #2   Title  Pt will obtain appropriate compression garments to maintain UE reduction    Time  3    Period  Weeks    Status  New    Target Date  06/05/18      PT LONG TERM GOAL #3   Title  Pt will decrease 15 cm proximal to ulnar styloid process to at least 21cm or less             Plan - 05/15/18 2119    Clinical Impression Statement  Kathleen Hanson presents with new onset of Lt UE lymphedema after mastectomy and SLNB in 2018.  She has little knowledge about lymphedema at this time but is following some of the precautions.  She will be leaving for 2 weeks on August 8th so the plan will be to wrap her until getitng measured for an off the shelf daytime garment.  shoulder ROM not assessed today due to time    History and Personal Factors relevant to plan of care:  hx of radiation, SLNB, chemotherapy, probably seroma present    Clinical Presentation  Evolving    Clinical Presentation due to:  new onset    Clinical Decision Making  Moderate    Rehab Potential  Excellent    PT Frequency  3x / week    PT Duration  3 weeks    PT Treatment/Interventions  ADLs/Self Care Home Management;DME Instruction;Therapeutic exercise;Patient/family education;Manual lymph drainage;Compression bandaging;Passive range of motion    PT Next Visit Plan  begin Rt UE MLD bandaging, lymph precautions handout, referral back?    Recommended Other Services  a special place for flat knit daytime compression with alight funds       Patient will benefit from skilled therapeutic intervention in order to improve the following deficits and impairments:  Decreased knowledge of precautions, Increased edema, Decreased knowledge of use of DME  Visit Diagnosis: Carcinoma of upper-outer quadrant of left breast in female, estrogen receptor positive ([place])  Postmastectomy lymphedema     Problem List Patient Active Problem List   Diagnosis Date Noted  . Lower extremity cellulitis  12/07/2017  . Genetic testing 08/22/2017  . Family history of breast cancer   . Pneumothorax on right 07/18/2017  . Malignant neoplasm of upper-outer quadrant of left breast in female, estrogen receptor positive ([place]) 06/30/2017    Shan Levans, PT 05/15/2018, 9:32 PM  [place], [place], [address] Phone: [phone]   Fax:  [phone]  Name: Kathleen Hanson MRN: [id number] Date of Birth: 05-17-1936

## 2018-05-15 NOTE — Telephone Encounter (Signed)
Attempted to reach patient in regard to her call last week about hair/nail changes. No answer. I left my contact info on her private voicemail requesting her to return my call.

## 2018-05-16 ENCOUNTER — Ambulatory Visit: Payer: Medicare HMO

## 2018-05-16 ENCOUNTER — Other Ambulatory Visit (HOSPITAL_COMMUNITY): Payer: Self-pay | Admitting: *Deleted

## 2018-05-16 DIAGNOSIS — R293 Abnormal posture: Secondary | ICD-10-CM

## 2018-05-16 DIAGNOSIS — Z170 Estrogen receptor positive status [ER+]: Principal | ICD-10-CM

## 2018-05-16 DIAGNOSIS — C50412 Malignant neoplasm of upper-outer quadrant of left female breast: Secondary | ICD-10-CM

## 2018-05-16 DIAGNOSIS — M25612 Stiffness of left shoulder, not elsewhere classified: Secondary | ICD-10-CM

## 2018-05-16 DIAGNOSIS — Z9189 Other specified personal risk factors, not elsewhere classified: Secondary | ICD-10-CM

## 2018-05-16 DIAGNOSIS — I972 Postmastectomy lymphedema syndrome: Secondary | ICD-10-CM

## 2018-05-16 NOTE — Therapy (Signed)
[place], [place], [address] Phone: [phone]   Fax:  [phone]  Physical Therapy Treatment  Patient Details  Name: Kathleen Hanson MRN: [id number] Date of Birth: [birth year] Referring Provider: Dr. Stark Klein   Encounter Date: 05/16/2018  PT End of Session - 05/16/18 1230    Visit Number  2    Number of Visits  9    Date for PT Re-Evaluation  06/05/18    PT Start Time  0937    PT Stop Time  1021    PT Time Calculation (min)  44 min    Activity Tolerance  Patient tolerated treatment well    Behavior During Therapy  [facility] for tasks assessed/performed       Past Medical History:  Diagnosis Date  . Cancer of left breast ([place])   . Constipation   . Family history of breast cancer   . Fibrocystic breast   . Hyperlipidemia   . Ocular migraine    "used to have them monthly for a period of time; seemed to stop; now have had a couple in the last week" (07/13/2017)    Past Surgical History:  Procedure Laterality Date  . BREAST BIOPSY Right ~ 2001   BREAST EXCISIONAL BIOPSY  . BREAST LUMPECTOMY Left 2001   chemo and radiation  . CESAREAN SECTION  X 1  . IR CATHETER TUBE CHANGE  07/20/2017  . MASTECTOMY COMPLETE / SIMPLE W/ SENTINEL NODE BIOPSY Left 07/13/2017    LEFT MASTECTOMY WITH SENTINEL NODE MAPPING ERAS PATHWAY Kathleen Hanson 07/13/2017  . MASTECTOMY W/ SENTINEL NODE BIOPSY Left 07/13/2017   Procedure: LEFT MASTECTOMY WITH SENTINEL NODE MAPPING ERAS PATHWAY;  Surgeon: [name], MD;  Location: [place];  Service: General;  Laterality: Left;  . PORT-A-CATH REMOVAL N/A 05/11/2018   Procedure: REMOVAL PORT-A-CATH;  Surgeon: Stark Klein, MD;  Location: [place];  Service: General;  Laterality: N/A;  . PORTACATH PLACEMENT Right 07/13/2017  . PORTACATH PLACEMENT Right 07/13/2017   Procedure: INSERTION PORT-A-CATH;  Surgeon: [name], MD;  Location: [place];  Service: General;   Laterality: Right;  . TONSILLECTOMY      There were no vitals filed for this visit.  Subjective Assessment - 05/16/18 0943    Subjective  Just want to get rid of this swelling. Nothing new since I was here yesterday.    Pertinent History  Lt lumpectomy 2001 with chemo and radiation and then mastectomy 2018 with SLNB 0/2 with chemotherapy again.  Now just on anastrozole.  high cholesterol    Patient Stated Goals  learn what to do for the UE    Currently in Pain?  No/denies            LYMPHEDEMA/ONCOLOGY QUESTIONNAIRE - 05/15/18 1622      Type   Cancer Type  s/p left mastectomy SLNB      Surgeries   Mastectomy Date  07/13/17    Sentinel Lymph Node Biopsy Date  07/13/17    Number Lymph Nodes Removed  2      Treatment   Past Chemotherapy Treatment  No    Active Radiation Treatment  No    Past Radiation Treatment  Yes    Date  10/26/99    Body Site  left breast    Current Hormone Treatment  Yes    Drug Name  anastrazole      What other symptoms do you have   Are you Having  Heaviness or Tightness  Yes    Are you having Pain  No    Are you having pitting edema  Yes    Is it Hard or Difficult finding clothes that fit  No    Do you have infections  No    Stemmer Sign  No      Lymphedema Assessments   Lymphedema Assessments  Upper extremities      Right Upper Extremity Lymphedema   15 cm Proximal to Olecranon Process  28 cm    10 cm Proximal to Olecranon Process  26 cm    Olecranon Process  24 cm    15 cm Proximal to Ulnar Styloid Process  19.8 cm    10 cm Proximal to Ulnar Styloid Process  16.8 cm    Just Proximal to Ulnar Styloid Process  15 cm    Across Hand at [employer]  18 cm    At [place] of 2nd Digit  6.5 cm      Left Upper Extremity Lymphedema   15 cm Proximal to Olecranon Process  28.8 cm    10 cm Proximal to Olecranon Process  27.8 cm    Olecranon Process  27.8 cm    15 cm Proximal to Ulnar Styloid Process  24 cm    10 cm Proximal to Ulnar Styloid  Process  19.8 cm    Just Proximal to Ulnar Styloid Process  16.3 cm    Across Hand at [employer]  18.5 cm    At [place] of 2nd Digit  6.1 cm    Other  9           Outpatient Rehab from [facility]/15/[facility]  Lymphedema Life Impact Scale Total Score  10.29 %           [facility] Adult PT Treatment/Exercise - 05/16/18 0001      Manual Therapy   Manual Therapy  Manual Lymphatic Drainage (MLD);Compression Bandaging    Manual Lymphatic Drainage (MLD)  In Supine: Short neck, superficial and deep abdominals, Rt axilla and Lt inguinal nodes, anterior inter-axillary and Lt axillo-inguinal anastomosis, then focused on Lt UE from dorsal hand to lateral upper arm working from proximal to distal then retracing all steps beginning to instruct pt in sequence throughout.    Compression Bandaging  Thick lotion, stockinette, Artiflex x1, 1-6, 1-10, and 1-12 cm short stretch compression bandages from hand to axilla. Trial of not wrapping fingers as pt has not swelling into her hand or fingers. Pt reported bandage comfortable at end of session.              PT Education - 05/16/18 1228    Education Details  Remedial exercises (Lt UE ROM of all joints) and to not remove bandages if possible before next visit, or to bring everything back if she must; basics of lymphatic system and principles of manual lymph drainage and compression bandaging          PT Long Term Goals - 05/15/18 2126      PT LONG TERM GOAL #1   Title  Pt will be ind with self MLD for continued care    Time  3    Period  Weeks    Status  New    Target Date  06/05/18      PT LONG TERM GOAL #2   Title  Pt will obtain appropriate compression garments to maintain UE reduction    Time  3    Period  Weeks    Status  New    Target Date  06/05/18      PT LONG TERM GOAL #3   Title  Pt will decrease 15 cm proximal to ulnar styloid process to at least 21cm or less            Plan  - 05/16/18 1230    Clinical Impression Statement  First session of complete decongestive therapy today to Lt UE. Instructed pt in basics of anatmoy of lymphatic system and principles and sequence of manual lymph drainage (MLD) and compression bandaging. Shared my concern that there may not be enough time to get pt reduced and have sleeve delivered before she leaves for her 2 1/2 weeks vacation on Aug 8. So instead pt is very interested in learning self MLD and bandaging and may bring her husband to learn as well.     PT Treatment/Interventions  ADLs/Self Care Home Management;DME Instruction;Therapeutic exercise;Patient/family education;Manual lymph drainage;Compression bandaging;Passive range of motion    PT Next Visit Plan  Assess Lt shoulder ROM; See how pt tolerated bandaging; cont complete decongestive therapy and begin instructing pt in both self MLD and bandaging (but in separate visits so as not to overwhelm pt); issue handout for lymph precautions (instructed her in infection prevention today); check for referral but pt plans to wait and order garment after her trip and once she attains max        Patient will benefit from skilled therapeutic intervention in order to improve the following deficits and impairments:     Visit Diagnosis: Carcinoma of upper-outer quadrant of left breast in female, estrogen receptor positive ([place])  Postmastectomy lymphedema  Abnormal posture  Stiffness of left shoulder, not elsewhere classified  At risk for lymphedema     Problem List Patient Active Problem List   Diagnosis Date Noted  . Lower extremity cellulitis 12/07/2017  . Genetic testing 08/22/2017  . Family history of breast cancer   . Pneumothorax on right 07/18/2017  . Malignant neoplasm of upper-outer quadrant of left breast in female, estrogen receptor positive ([place]) 06/30/2017    Otelia Limes, PTA 05/16/2018, 12:42 PM  [place], [place], [address] Phone: [phone]   Fax:  [phone]  Name: Kathleen Hanson MRN: [id number] Date of Birth: Jun 08, 1936

## 2018-05-17 ENCOUNTER — Ambulatory Visit (HOSPITAL_BASED_OUTPATIENT_CLINIC_OR_DEPARTMENT_OTHER)
Admission: RE | Admit: 2018-05-17 | Discharge: 2018-05-17 | Disposition: A | Discharge status: Home / Self Care | Payer: Medicare HMO | Source: Ambulatory Visit | Attending: Internal Medicine | Admitting: Internal Medicine

## 2018-05-17 ENCOUNTER — Ambulatory Visit (HOSPITAL_COMMUNITY)
Admission: RE | Admit: 2018-05-17 | Discharge: 2018-05-17 | Disposition: A | Discharge status: Home / Self Care | Payer: Medicare HMO | Source: Ambulatory Visit | Attending: Family Medicine | Admitting: Family Medicine

## 2018-05-17 VITALS — BP 147/67 | HR 60 | Wt 139.4 lb

## 2018-05-17 DIAGNOSIS — C50412 Malignant neoplasm of upper-outer quadrant of left female breast: Secondary | ICD-10-CM | POA: Insufficient documentation

## 2018-05-17 DIAGNOSIS — Z8052 Family history of malignant neoplasm of bladder: Secondary | ICD-10-CM | POA: Insufficient documentation

## 2018-05-17 DIAGNOSIS — Z9012 Acquired absence of left breast and nipple: Secondary | ICD-10-CM | POA: Diagnosis not present

## 2018-05-17 DIAGNOSIS — E785 Hyperlipidemia, unspecified: Secondary | ICD-10-CM | POA: Insufficient documentation

## 2018-05-17 DIAGNOSIS — R600 Localized edema: Secondary | ICD-10-CM | POA: Insufficient documentation

## 2018-05-17 DIAGNOSIS — Z79899 Other long term (current) drug therapy: Secondary | ICD-10-CM | POA: Insufficient documentation

## 2018-05-17 DIAGNOSIS — I119 Hypertensive heart disease without heart failure: Secondary | ICD-10-CM | POA: Insufficient documentation

## 2018-05-17 DIAGNOSIS — Z87891 Personal history of nicotine dependence: Secondary | ICD-10-CM | POA: Diagnosis not present

## 2018-05-17 DIAGNOSIS — Z803 Family history of malignant neoplasm of breast: Secondary | ICD-10-CM | POA: Diagnosis not present

## 2018-05-17 DIAGNOSIS — Z9221 Personal history of antineoplastic chemotherapy: Secondary | ICD-10-CM | POA: Insufficient documentation

## 2018-05-17 NOTE — Addendum Note (Signed)
Encounter addended by: Scarlette Calico, RN on: 05/17/2018 12:01 PM  Actions taken: Sign clinical note

## 2018-05-17 NOTE — Patient Instructions (Signed)
Follow up as needed

## 2018-05-17 NOTE — Progress Notes (Signed)
  Echocardiogram 2D Echocardiogram has been performed.  Kathleen Hanson 05/17/2018, 11:01 AM

## 2018-05-17 NOTE — Progress Notes (Signed)
CARDIO-ONCOLOGY CLINIC NOTE  Referring Physician: Burr Medico   HPI:  Kathleen Hanson is 82 y.o. female with triple + left breast cancer referred by Dr. Burr Medico for enrollment into the Cardio-Oncology program.  She has h/o HL but no known heart disease. 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 Treated with adriamycin and XRT. Diagnosed with left breast CA in 9/18. Has undergone lest mastectomy.   Undergoing adjuvant TCHP with Onpro q3weeks for 6 cycles started on 08/26/17. Finished Hercpetin 03/24/18  Here for f/u. Overall doing well. Finished Herceptin 03/24/18. No having problems with lymphedema in LUE. Left leg edema improved with higher dose of lasix. No sob, orthopnea or PND. Fatigue improved.   Echo 1/19 EF 60-65% Grade I DD. LS ' 12.0 cm/s GLS inaccurate Personally reviewed Echo 02/08/18 EF 60% GLS -22.1% LS inaccurate  Echo 05/17/18 EF 60-65% Grade I DD LS' 8.3 cm/s GLS -22.5% Personally reviewed       Oncology History   Cancer Staging Malignant neoplasm of upper-outer quadrant of left breast in female, estrogen receptor positive ([place]) Staging form: Breast, AJCC 8th Edition - Clinical stage from 07/06/2017: Stage IA (cT1c, cN0, cM0, G3, ER: Positive, PR: Positive, HER2: Positive) - Unsigned Staging comments: Staged at breast conference 9.12.18 - Pathologic stage from 07/13/2017: Stage IA (pT2, pN0, cM0, G3, ER: Positive, PR: Positive, HER2: Positive) - Signed by [name], MD on 08/16/2017       Malignant neoplasm of upper-outer quadrant of left breast in female, estrogen receptor positive ([place])   06/23/2017 Mammogram    Diagnostic mammogram 06/23/17 IMPRESSION: Indeterminate mass with irregular margins at the 2:30 position 4 cm from nipple measuring 1.4 x 0.8 x 1.2 cm in the upper-outer left breast.      06/28/2017 Initial Biopsy    Diagnosis 06/28/17 Breast, left, needle core biopsy, upper outer quadrant, 2:30 o'clock, 4cm from  nipple - INVASIVE MAMMARY CARCINOMA WITH CALCIFICATIONS, SEE COMMENT.      06/28/2017 Receptors her2    Estrogen Receptor: 100%, POSITIVE, STRONG STAINING INTENSITY Progesterone Receptor: 20%, POSITIVE, STRONG STAINING INTENSITY Proliferation Marker Ki67: 25% HER2: Postive       06/30/2017 Initial Diagnosis    Malignant neoplasm of upper-outer quadrant of left breast in female, estrogen receptor positive ([place])      07/13/2017 Surgery    Left mastectomy with SLN biopsy performed by Dr Marlou Starks.       07/13/2017 Pathology Results    Diagnosis  1. Breast, simple mastectomy, Left - INVASIVE AND IN SITU DUCTAL CARCINOMA, 4 CM, MSBR GRADE 3. - MARGINS NOT INVOLVED. - SEE ONCOLOGY TABLE. 2. Lymph node, sentinel, biopsy, Left #1 - ONE BENIGN LYMPH NODE (0/1). 3. Lymph node, biopsy, Left #2 - ONE BENIGN LYMPH NODE (0/1).    08/26/2017 -  Chemotherapy    adjuvant TCHP with Onpro q3weeks for 6 cycles started on 08/26/17 followed herceptin maintenance therapy and anti-estrogen therapy      Past Medical History:  Diagnosis Date  . Cancer of left breast ([place])   . Constipation   . Family history of breast cancer   . Fibrocystic breast   . Hyperlipidemia   . Ocular migraine    "used to have them monthly for a period of time; seemed to stop; now have had a couple in the last week" (07/13/2017)    Current Outpatient Medications  Medication Sig Dispense Refill  . calcium-vitamin D (OSCAL WITH D) 500-200 MG-UNIT tablet Take  1 tablet by mouth.    . Cholecalciferol (VITAMIN D3) 2000 units TABS Take by mouth.    . fluticasone (FLONASE) 50 MCG/ACT nasal spray Place 2 sprays into the nose daily as needed.    . furosemide (LASIX) 20 MG tablet Take 1 tablet (20 mg total) by mouth daily. 10 tablet 0  . lidocaine-prilocaine (EMLA) cream Apply to affected area once 30 g 3  . Multiple Vitamin (MULTIVITAMIN WITH MINERALS) TABS tablet Take 1 tablet by mouth daily.    .  polyethylene glycol powder (GLYCOLAX/MIRALAX) powder MIX 17 GRAMS IN LIQUID AND DRINK DAILY IN THE EVENING    . potassium chloride SA (K-DUR,KLOR-CON) 20 MEQ tablet Take 1 tablet (20 mEq total) by mouth 2 (two) times daily. 10 tablet 0  . simvastatin (ZOCOR) 40 MG tablet Take 40 mg daily by mouth.    . traMADol (ULTRAM) 50 MG tablet Take 1-2 tablets (50-100 mg total) by mouth every 6 (six) hours as needed for moderate pain or severe pain. 15 tablet 0  . vitamin C (ASCORBIC ACID) 500 MG tablet Take 500 mg by mouth daily.     No current facility-administered medications for this encounter.     No Known Allergies    Social History   Socioeconomic History  . Marital status: Married    Spouse name: Not on file  . Number of children: Not on file  . Years of education: Not on file  . Highest education level: Not on file  Occupational History  . Not on file  Social Needs  . Financial resource strain: Not on file  . Food insecurity:    Worry: Not on file    Inability: Not on file  . Transportation needs:    Medical: Not on file    Non-medical: Not on file  Tobacco Use  . Smoking status: Former Smoker    Packs/day: 0.50    Years: 15.00    Pack years: 7.50    Types: Cigarettes    Last attempt to quit: 1976    Years since quitting: 43.5  . Smokeless tobacco: Never Used  Substance and Sexual Activity  . Alcohol use: Yes    Alcohol/week: 3.0 oz    Types: 5 Glasses of wine per week    Comment: social  . Drug use: No  . Sexual activity: Not Currently  Lifestyle  . Physical activity:    Days per week: Not on file    Minutes per session: Not on file  . Stress: Not on file  Relationships  . Social connections:    Talks on phone: Not on file    Gets together: Not on file    Attends religious service: Not on file    Active member of club or organization: Not on file    Attends meetings of clubs or organizations: Not on file    Relationship status: Not on file  . Intimate partner  violence:    Fear of current or ex partner: Not on file    Emotionally abused: Not on file    Physically abused: Not on file    Forced sexual activity: Not on file  Other Topics Concern  . Not on file  Social History Narrative  . Not on file      Family History  Problem Relation Age of Onset  . Cancer Father        bladder cancer  . Breast cancer Sister 31    Vitals:   05/17/18 1113  BP: (!) 147/67  Pulse: 60  SpO2: 99%  Weight: 139 lb 6.4 oz (63.2 kg)    PHYSICAL EXAM: General:  Well appearing. No resp difficulty HEENT: normal Neck: supple. no JVD. Carotids 2+ bilat; no bruits. No lymphadenopathy or thryomegaly appreciated. Cor: PMI nondisplaced. Regular rate & rhythm. No rubs, gallops or murmurs.  Lungs: clear Abdomen: soft, nontender, nondistended. No hepatosplenomegaly. No bruits or masses. Good bowel sounds. Extremities: no cyanosis, clubbing, rash, edema LUE wrapped with lymphedema sleeve LLE mild erythema no edema Neuro: alert & orientedx3, cranial nerves grossly intact. moves all 4 extremities w/o difficulty. Affect pleasant   ASSESSMENT & PLAN: 1. Malignant neoplasm of upper-outer quadrant of left breast in female, invasive ductal carcinoma, pT2N0M0, Stage: 1A, triple positive, Grade 3 -She underwent mastectomy on 07/13/17 -She started adjuvant TCH with onpro on 08/26/17, with moderate side effects and overall not feeling well.  - Completed Herceptin 03/24/18  - Continue anastrazole - I reviewed echos personally. EF and Doppler parameters stable. No HF on exam.  - can f/u PRN  2. HTN - Says home SBP typically 120s.   3. LLE edema - ABIs normal 5/19 - Likely venous insufficiency. Use compression hose and exercise.   Glori Bickers, MD  11:49 AM

## 2018-05-18 ENCOUNTER — Encounter: Payer: Self-pay | Admitting: Adult Health

## 2018-05-18 ENCOUNTER — Inpatient Hospital Stay: Payer: Medicare HMO | Attending: Hematology | Admitting: Adult Health

## 2018-05-18 ENCOUNTER — Ambulatory Visit: Payer: Medicare HMO

## 2018-05-18 VITALS — BP 154/68 | HR 78 | Temp 98.0°F | Resp 18 | Ht 64.0 in | Wt 139.1 lb

## 2018-05-18 DIAGNOSIS — M25612 Stiffness of left shoulder, not elsewhere classified: Secondary | ICD-10-CM

## 2018-05-18 DIAGNOSIS — C50412 Malignant neoplasm of upper-outer quadrant of left female breast: Secondary | ICD-10-CM | POA: Diagnosis not present

## 2018-05-18 DIAGNOSIS — Z170 Estrogen receptor positive status [ER+]: Secondary | ICD-10-CM | POA: Diagnosis not present

## 2018-05-18 DIAGNOSIS — Z79811 Long term (current) use of aromatase inhibitors: Secondary | ICD-10-CM | POA: Diagnosis not present

## 2018-05-18 DIAGNOSIS — Z87891 Personal history of nicotine dependence: Secondary | ICD-10-CM | POA: Insufficient documentation

## 2018-05-18 DIAGNOSIS — I972 Postmastectomy lymphedema syndrome: Secondary | ICD-10-CM

## 2018-05-18 DIAGNOSIS — R293 Abnormal posture: Secondary | ICD-10-CM

## 2018-05-18 DIAGNOSIS — Z9189 Other specified personal risk factors, not elsewhere classified: Secondary | ICD-10-CM

## 2018-05-18 NOTE — Progress Notes (Signed)
CLINIC:  Survivorship   REASON FOR VISIT:  Routine follow-up post-treatment for a recent history of breast cancer.  BRIEF ONCOLOGIC HISTORY:  Oncology History   Cancer Staging Malignant neoplasm of upper-outer quadrant of left breast in female, estrogen receptor positive ([place]) Staging form: Breast, AJCC 8th Edition - Clinical stage from 07/06/2017: Stage IA (cT1c, cN0, cM0, G3, ER: Positive, PR: Positive, HER2: Positive) - Unsigned Staging comments: Staged at breast conference 9.12.18 - Pathologic stage from 07/13/2017: Stage IA (pT2, pN0, cM0, G3, ER: Positive, PR: Positive, HER2: Positive) - Signed by [name], MD on 08/16/2017       Malignant neoplasm of upper-outer quadrant of left breast in female, estrogen receptor positive ([place])   06/23/2017 Mammogram    Diagnostic mammogram 06/23/17 IMPRESSION: Indeterminate mass with irregular margins at the 2:30 position 4 cm from nipple measuring 1.4 x 0.8 x 1.2 cm in the upper-outer left breast.      06/28/2017 Initial Biopsy    Diagnosis 06/28/17 Breast, left, needle core biopsy, upper outer quadrant, 2:30 o'clock, 4cm from nipple - INVASIVE MAMMARY CARCINOMA WITH CALCIFICATIONS, SEE COMMENT.      06/28/2017 Receptors her2    Estrogen Receptor: 100%, POSITIVE, STRONG STAINING INTENSITY Progesterone Receptor: 20%, POSITIVE, STRONG STAINING INTENSITY Proliferation Marker Ki67: 25% HER2: Postive       06/30/2017 Initial Diagnosis    Malignant neoplasm of upper-outer quadrant of left breast in female, estrogen receptor positive ([place])      07/13/2017 Surgery    Left mastectomy with SLN biopsy performed by Dr Marlou Starks.       07/13/2017 Pathology Results    Diagnosis  1. Breast, simple mastectomy, Left - INVASIVE AND IN SITU DUCTAL CARCINOMA, 4 CM, MSBR GRADE 3. - MARGINS NOT INVOLVED. - SEE ONCOLOGY TABLE. 2. Lymph node, sentinel, biopsy, Left #1 - ONE BENIGN LYMPH NODE (0/1). 3. Lymph node, biopsy, Left #2 - ONE BENIGN LYMPH NODE  (0/1).      07/18/2017 - 07/27/2017 Hospital Admission    Admit date: 07/18/17 Admission diagnosis: spontaneous PNX Additional comments: the patient was admitted with a pneumothorax several days after a port was placed although her post procedure cxr was normal. She had a chest tube placed but had a persistent air leak. The pleurivac was placed to 40 cm h2o suction for several days and the pneumothorax finally resolved.       08/15/2017 Imaging    CT A/P IMPRESSION: 1. No CT findings to suggest metastatic disease involving the abdomen/pelvis. 2. Left chest wall fluid collection as discussed above. 3. Small right pleural effusion with minimal overlying atelectasis. 4. Cholelithiasis. 5. Small periumbilical abdominal wall hernia containing fat.       08/15/2017 Imaging    NM Whole Body Bone Scan FINDINGS: Uptake at the LEFT lateral aspects of the cervical and lower thoracic spine, typically degenerative.  Uptake at the shoulders, elbows, wrists, hips, and knees, typically degenerative.  Asymmetric uptake at the sternoclavicular joints RIGHT greater than LEFT which may also be degenerative.  No definite foci of abnormal osseous tracer accumulation are identified which are suspicious for osseous metastases.  Biconvex thoracolumbar scoliosis.  Expected urinary tract and soft tissue distribution of tracer.  IMPRESSION: Scattered likely degenerative type uptake as above.  No definite scintigraphic evidence of osseous metastatic disease.      08/16/2017 Genetic Testing    The patient had genetic testing due to a personal and family history of breast cancer.  The [employer] Multi-Cancer Panel was ordered.  The Multi-Cancer Panel offered by [employer] includes sequencing and/or deletion duplication testing of the following 83 genes: ALK, APC, ATM, AXIN2,BAP1,  BARD1, BLM, BMPR1A, BRCA1, BRCA2, BRIP1, CASR, CDC73, CDH1, CDK4, CDKN1B, CDKN1C, CDKN2A (p14ARF), CDKN2A (p16INK4a),  CEBPA, CHEK2, CTNNA1, DICER1, DIS3L2, EGFR (c.2369C>T, p.Thr790Met variant only), EPCAM (Deletion/duplication testing only), FH, FLCN, GATA2, GPC3, GREM1 (Promoter region deletion/duplication testing only), HOXB13 (c.251G>A, p.Gly84Glu), HRAS, KIT, MAX, MEN1, MET, MITF (c.952G>A, p.Glu318Lys variant only), MLH1, MSH2, MSH3, MSH6, MUTYH, NBN, NF1, NF2, NTHL1, PALB2, PDGFRA, PHOX2B, PMS2, POLD1, POLE, POT1, PRKAR1A, PTCH1, PTEN, RAD50, RAD51C, RAD51D, RB1, RECQL4, RET, RUNX1, SDHAF2, SDHA (sequence changes only), SDHB, SDHC, SDHD, SMAD4, SMARCA4, SMARCB1, SMARCE1, STK11, SUFU, TERC, TERT, TMEM127, TP53, TSC1, TSC2, VHL, WRN and WT1.   Results: Negative, no pathogenic variants identified in the genes analyzed.  The date of this test report is 08/16/2017.         08/26/2017 - 03/24/2018 Chemotherapy    adjuvant TCHP with Onpro q3weeks for 4 cycles followed herceptin maintenance therapy every 3 weeks starting 12/28/17 and comepleted after 10 cycles on 03/24/18      08/31/2017 Breast US    On physical exam,there is a firm smooth mass in the low left axilla spanning at least 5 cm.  Ultrasound is performed, showing a complicated fluid collection which is predominantly anechoic measuring 5.2 x 3.9 x 3.5 cm. No blood flow was identified within the soft tissue component of the mass on color Doppler imaging. The appearance is most consistent with a postoperative seroma/resolving hematoma.  IMPRESSION: The complex fluid collection in the left axilla is very likely to represent a postoperative seroma/ resolving hematoma.  RECOMMENDATION: Three-month follow-up left axillary ultrasound is recommended.       02/2018 -  Anti-estrogen oral therapy    Anastrozole daily       INTERVAL HISTORY:  Kathleen Hanson presents to the [facility] today for our initial meeting to review her survivorship care plan detailing her treatment course for breast cancer, as well as monitoring long-term side effects  of that treatment, education regarding health maintenance, screening, and overall wellness and health promotion.     Overall, Kathleen Hanson reports feeling quite well.  She is taking Anastrozole daily and is tolerating it well.  She underwent bone density in 09/2017 that was normal.      REVIEW OF SYSTEMS:  Review of Systems  Constitutional: Negative for appetite change, chills, fatigue, fever and unexpected weight change.  HENT:   Negative for hearing loss, lump/mass, sore throat and trouble swallowing.   Eyes: Negative for eye problems and icterus.  Respiratory: Negative for chest tightness, cough and shortness of breath.   Cardiovascular: Negative for chest pain, leg swelling and palpitations.  Gastrointestinal: Negative for abdominal distention, abdominal pain, constipation, diarrhea, nausea and vomiting.  Endocrine: Negative for hot flashes.  Skin: Negative for itching and rash.  Neurological: Negative for dizziness, extremity weakness, headaches and numbness.  Hematological: Negative for adenopathy. Does not bruise/bleed easily.  Psychiatric/Behavioral: Negative for depression. The patient is not nervous/anxious.   Breast: Denies any new nodularity, masses, tenderness, nipple changes, or nipple discharge.      ONCOLOGY TREATMENT TEAM:  1. Surgeon:  Dr. Barry Dienes at [facility] 2. Medical Oncologist: Dr. Burr Medico  3. Radiation Oncologist: Dr. Sondra Come    PAST MEDICAL/SURGICAL HISTORY:  Past Medical History:  Diagnosis Date  . Cancer of left breast ([place])   . Constipation   . Family history of breast cancer   . Fibrocystic  breast   . Hyperlipidemia   . Ocular migraine    "used to have them monthly for a period of time; seemed to stop; now have had a couple in the last week" (07/13/2017)   Past Surgical History:  Procedure Laterality Date  . BREAST BIOPSY Right ~ 2001   BREAST EXCISIONAL BIOPSY  . BREAST LUMPECTOMY Left 2001   chemo and radiation  . CESAREAN  SECTION  X 1  . IR CATHETER TUBE CHANGE  07/20/2017  . MASTECTOMY COMPLETE / SIMPLE W/ SENTINEL NODE BIOPSY Left 07/13/2017    LEFT MASTECTOMY WITH SENTINEL NODE MAPPING ERAS PATHWAY [name] 07/13/2017  . MASTECTOMY W/ SENTINEL NODE BIOPSY Left 07/13/2017   Procedure: LEFT MASTECTOMY WITH SENTINEL NODE MAPPING ERAS PATHWAY;  Surgeon: [name], MD;  Location: [facility];  Service: General;  Laterality: Left;  . PORT-A-CATH REMOVAL N/A 05/11/2018   Procedure: REMOVAL PORT-A-CATH;  Surgeon: Stark Klein, MD;  Location: [facility];  Service: General;  Laterality: N/A;  . PORTACATH PLACEMENT Right 07/13/2017  . PORTACATH PLACEMENT Right 07/13/2017   Procedure: INSERTION PORT-A-CATH;  Surgeon: [name], MD;  Location: [facility];  Service: General;  Laterality: Right;  . TONSILLECTOMY       ALLERGIES:  No Known Allergies   CURRENT MEDICATIONS:  Outpatient Encounter Medications as of 05/18/2018  Medication Sig  . anastrozole (ARIMIDEX) 1 MG tablet   . calcium-vitamin D (OSCAL WITH D) 500-200 MG-UNIT tablet Take 1 tablet by mouth.  . Cholecalciferol (VITAMIN D3) 2000 units TABS Take by mouth.  . fluticasone (FLONASE) 50 MCG/ACT nasal spray Place 2 sprays into the nose daily as needed.  . furosemide (LASIX) 20 MG tablet Take 1 tablet (20 mg total) by mouth daily.  . Multiple Vitamin (MULTIVITAMIN WITH MINERALS) TABS tablet Take 1 tablet by mouth daily.  . polyethylene glycol powder (GLYCOLAX/MIRALAX) powder MIX 17 GRAMS IN LIQUID AND DRINK DAILY IN THE EVENING  . potassium chloride SA (K-DUR,KLOR-CON) 20 MEQ tablet Take 1 tablet (20 mEq total) by mouth 2 (two) times daily.  . simvastatin (ZOCOR) 40 MG tablet Take 40 mg daily by mouth.  . vitamin C (ASCORBIC ACID) 500 MG tablet Take 500 mg by mouth daily.  . [DISCONTINUED] lidocaine-prilocaine (EMLA) cream Apply to affected area once (Patient not taking: Reported on 05/18/2018)  . [DISCONTINUED] traMADol (ULTRAM) 50 MG tablet Take  1-2 tablets (50-100 mg total) by mouth every 6 (six) hours as needed for moderate pain or severe pain. (Patient not taking: Reported on 05/18/2018)   No facility-administered encounter medications on file as of 05/18/2018.      ONCOLOGIC FAMILY HISTORY:  Family History  Problem Relation Age of Onset  . Cancer Father        bladder cancer  . Breast cancer Sister 84       SOCIAL HISTORY:  Social History   Socioeconomic History  . Marital status: Married    Spouse name: Not on file  . Number of children: Not on file  . Years of education: Not on file  . Highest education level: Not on file  Occupational History  . Not on file  Social Needs  . Financial resource strain: Not on file  . Food insecurity:    Worry: Not on file    Inability: Not on file  . Transportation needs:    Medical: Not on file    Non-medical: Not on file  Tobacco Use  . Smoking status: Former Smoker  Packs/day: 0.50    Years: 15.00    Pack years: 7.50    Types: Cigarettes    Last attempt to quit: 1976    Years since quitting: 43.5  . Smokeless tobacco: Never Used  Substance and Sexual Activity  . Alcohol use: Yes    Alcohol/week: 3.0 oz    Types: 5 Glasses of wine per week    Comment: social  . Drug use: No  . Sexual activity: Not Currently  Lifestyle  . Physical activity:    Days per week: Not on file    Minutes per session: Not on file  . Stress: Not on file  Relationships  . Social connections:    Talks on phone: Not on file    Gets together: Not on file    Attends religious service: Not on file    Active member of club or organization: Not on file    Attends meetings of clubs or organizations: Not on file    Relationship status: Not on file  . Intimate partner violence:    Fear of current or ex partner: Not on file    Emotionally abused: Not on file    Physically abused: Not on file    Forced sexual activity: Not on file  Other Topics Concern  . Not on file  Social History  Narrative  . Not on file      PHYSICAL EXAMINATION:  Vital Signs:   Vitals:   05/18/18 1417  BP: (!) 154/68  Pulse: 78  Resp: 18  Temp: 98 F (36.7 C)  SpO2: 98%   Filed Weights   05/18/18 1417  Weight: 139 lb 1.6 oz (63.1 kg)   General: Well-nourished, well-appearing female in no acute distress.  She is unaccompanied today.   HEENT: Head is normocephalic.  Pupils equal and reactive to light. Conjunctivae clear without exudate.  Sclerae anicteric. Oral mucosa is pink, moist.  Oropharynx is pink without lesions or erythema.  Lymph: No cervical, supraclavicular, or infraclavicular lymphadenopathy noted on palpation.  Cardiovascular: Regular rate and rhythm.Marland Kitchen Respiratory: Clear to auscultation bilaterally. Chest expansion symmetric; breathing non-labored.  GI: Abdomen soft and round; non-tender, non-distended. Bowel sounds normoactive.  GU: Deferred.  Neuro: No focal deficits. Steady gait.  Psych: Mood and affect normal and appropriate for situation.  Extremities: No edema. MSK: No focal spinal tenderness to palpation.  Full range of motion in bilateral upper extremities Skin: Warm and dry.  LABORATORY DATA:  None for this visit.  DIAGNOSTIC IMAGING:  None for this visit.      ASSESSMENT AND PLAN:  Ms.. Hanson is a pleasant 82 y.o. female with Stage IA left breast invasive ductal carcinoma, ER+/PR+/HER2+, diagnosed in 06/2017, treated with mastectomy, adjuvant chemotherapy, maintenance Herceptin, and anti-estrogen therapy with Anastrozole beginning in 02/2018.  She presents to the Survivorship Clinic for our initial meeting and routine follow-up post-completion of treatment for breast cancer.    1. Stage IA left breast cancer:  Kathleen Hanson is continuing to recover from definitive treatment for breast cancer. She will follow-up with her medical oncologist, Dr. Burr Medico in 06/2018 with history and physical exam per surveillance protocol.  She will continue her anti-estrogen  therapy with Anastrozole. Thus far, she is tolerating the Anastrozole well, with minimal side effects.  Today, a comprehensive survivorship care plan and treatment summary was reviewed with the patient today detailing her breast cancer diagnosis, treatment course, potential late/long-term effects of treatment, appropriate follow-up care with recommendations for the future, and patient education  resources.  A copy of this summary, along with a letter will be sent to the patient's primary care provider via mail/fax/In Basket message after today's visit.    2. Bone health:  Given Kathleen Hanson's age/history of breast cancer and her current treatment regimen including anti-estrogen therapy with Anastrozole, she is at risk for bone demineralization.  Her last DEXA scan was in 09/2017  And was normal.  She will be due for a repeat in 09/2019.  In the meantime, she was encouraged to increase her consumption of foods rich in calcium, as well as increase her weight-bearing activities.  She was given education on specific activities to promote bone health.  3. Cancer screening:  Due to Kathleen Hanson's history and her age, she should receive screening for skin cancers, colon cancer, and gynecologic cancers.  The information and recommendations are listed on the patient's comprehensive care plan/treatment summary and were reviewed in detail with the patient.    4. Health maintenance and wellness promotion: Kathleen Hanson was encouraged to consume 5-7 servings of fruits and vegetables per day. We reviewed the "Nutrition Rainbow" handout, as well as the handout "Take Control of Your Health and Reduce Your Cancer Risk" from the [place].  She was also encouraged to engage in moderate to vigorous exercise for 30 minutes per day most days of the week. We discussed the [employer] fitness program, which is designed for cancer survivors to help them become more physically fit after cancer treatments.  She was  instructed to limit her alcohol consumption and continue to abstain from tobacco use.     5. Support services/counseling: It is not uncommon for this period of the patient's cancer care trajectory to be one of many emotions and stressors.  We discussed an opportunity for her to participate in the next session of [facility] ("Finding Your New Normal") support group series designed for patients after they have completed treatment.   Kathleen Hanson was encouraged to take advantage of our many other support services programs, support groups, and/or counseling in coping with her new life as a cancer survivor after completing anti-cancer treatment.  She was given information regarding our available services and encouraged to contact me with any questions or for help enrolling in any of our support group/programs.    Dispo:   -Return to cancer center for f/u with Dr. Burr Medico 07/21/2018  -Mammogram due in 06/24/2018 -She is welcome to return back to the Survivorship Clinic at any time; no additional follow-up needed at this time.  -Consider referral back to survivorship as a long-term survivor for continued surveillance  A total of (30) minutes of face-to-face time was spent with this patient with greater than 50% of that time in counseling and care-coordination.   Gardenia Phlegm, NP Survivorship Program [facility] [phone]   Note: PRIMARY CARE PROVIDER Neita Carp, [place] [phone]

## 2018-05-18 NOTE — Therapy (Signed)
[place], [place], [address] Phone: [phone]   Fax:  [phone]  Physical Therapy Treatment  Patient Details  Name: Kathleen Hanson MRN: [id number] Date of Birth: [birth year] Referring Provider: Dr. Stark Klein   Encounter Date: 05/18/2018  PT End of Session - 05/18/18 1210    Visit Number  3    Number of Visits  9    Date for PT Re-Evaluation  06/05/18    PT Start Time  6962    PT Stop Time  1111    PT Time Calculation (min)  48 min    Activity Tolerance  Patient tolerated treatment well    Behavior During Therapy  [facility] for tasks assessed/performed       Past Medical History:  Diagnosis Date  . Cancer of left breast ([place])   . Constipation   . Family history of breast cancer   . Fibrocystic breast   . Hyperlipidemia   . Ocular migraine    "used to have them monthly for a period of time; seemed to stop; now have had a couple in the last week" (07/13/2017)    Past Surgical History:  Procedure Laterality Date  . BREAST BIOPSY Right ~ 2001   BREAST EXCISIONAL BIOPSY  . BREAST LUMPECTOMY Left 2001   chemo and radiation  . CESAREAN SECTION  X 1  . IR CATHETER TUBE CHANGE  07/20/2017  . MASTECTOMY COMPLETE / SIMPLE W/ SENTINEL NODE BIOPSY Left 07/13/2017    LEFT MASTECTOMY WITH SENTINEL NODE MAPPING ERAS PATHWAY [name] 07/13/2017  . MASTECTOMY W/ SENTINEL NODE BIOPSY Left 07/13/2017   Procedure: LEFT MASTECTOMY WITH SENTINEL NODE MAPPING ERAS PATHWAY;  Surgeon: [name], MD;  Location: [facility];  Service: General;  Laterality: Left;  . PORT-A-CATH REMOVAL N/A 05/11/2018   Procedure: REMOVAL PORT-A-CATH;  Surgeon: Stark Klein, MD;  Location: [facility];  Service: General;  Laterality: N/A;  . PORTACATH PLACEMENT Right 07/13/2017  . PORTACATH PLACEMENT Right 07/13/2017   Procedure: INSERTION PORT-A-CATH;  Surgeon: [name], MD;  Location: [place] [facility];  Service: General;   Laterality: Right;  . TONSILLECTOMY      There were no vitals filed for this visit.  Subjective Assessment - 05/18/18 1030    Subjective  The bandages cause some itching at my upper arm but that was my only real problem. And the hand slid down some.     Pertinent History  Lt lumpectomy 2001 with chemo and radiation and then mastectomy 2018 with SLNB 0/2 with chemotherapy again.  Now just on anastrozole.  high cholesterol    Patient Stated Goals  learn what to do for the UE    Currently in Pain?  No/denies            LYMPHEDEMA/ONCOLOGY QUESTIONNAIRE - 05/18/18 1032      Left Upper Extremity Lymphedema   15 cm Proximal to Olecranon Process  27.1 cm    10 cm Proximal to Olecranon Process  26.3 cm    Olecranon Process  25.3 cm    15 cm Proximal to Ulnar Styloid Process  23.7 cm    10 cm Proximal to Ulnar Styloid Process  20.4 cm    Just Proximal to Ulnar Styloid Process  15 cm    Across Hand at [employer]  17.1 cm    At [place] of 2nd Digit  5.7 cm           Outpatient  Rehab from 05/15/2018 in [facility]  Lymphedema Life Impact Scale Total Score  10.29 %           [facility] Adult PT Treatment/Exercise - 05/18/18 0001      Manual Therapy   Manual Lymphatic Drainage (MLD)  In Supine: Short neck, superficial and deep abdominals, Rt axilla and Lt inguinal nodes, anterior inter-axillary and Lt axillo-inguinal anastomosis, then focused on Lt UE from dorsal hand to lateral upper arm working from proximal to distal then retracing all steps beginning to instruct pt in sequence throughout.    Compression Bandaging  Thick lotion, stockinette, Artiflex x1, 1-6, 1-10, and 1-12 cm short stretch compression bandages from hand to axilla. Trial of not wrapping fingers as pt has not swelling into her hand or fingers. Pt reported bandage comfortable at end of session.                   PT Long Term Goals - 05/15/18 2126      PT LONG TERM  GOAL #1   Title  Pt will be ind with self MLD for continued care    Time  3    Period  Weeks    Status  New    Target Date  06/05/18      PT LONG TERM GOAL #2   Title  Pt will obtain appropriate compression garments to maintain UE reduction    Time  3    Period  Weeks    Status  New    Target Date  06/05/18      PT LONG TERM GOAL #3   Title  Pt will decrease 15 cm proximal to ulnar styloid process to at least 21cm or less            Plan - 05/18/18 1211    Clinical Impression Statement  Pt left bandages on and seemed to tolerate them okay per her report. Reviewed all information from last time per her request regarding anatomy of lymphatic system and principles of manual lymph drainage. When discussing possiblity of bandaging for her trip today pt seemed to be overwhelmed by this so will revisit this discussion on Monday and consider have pt trying to bandage her own arm. Pt is willing to try.  Circumference measurements taken and her arm has reduced very well in a short period of time.     Rehab Potential  Excellent    PT Frequency  3x / week    PT Duration  3 weeks    PT Treatment/Interventions  ADLs/Self Care Home Management;DME Instruction;Therapeutic exercise;Patient/family education;Manual lymph drainage;Compression bandaging;Passive range of motion    PT Next Visit Plan  Assess Lt shoulder ROM; Cont complete decongestive therapy and begin instructing pt in both self MLD and bandaging (but in separate visits so as not to overwhelm pt); issue handout for lymph precautions (instructed her in infection prevention today); check for referral but pt plans to wait and order garment after her trip and once she attains max circumferential reductions    Consulted and Agree with Plan of Care  Patient       Patient will benefit from skilled therapeutic intervention in order to improve the following deficits and impairments:  Decreased knowledge of precautions, Increased edema, Decreased  knowledge of use of DME  Visit Diagnosis: Carcinoma of upper-outer quadrant of left breast in female, estrogen receptor positive ([place])  Postmastectomy lymphedema  Abnormal posture  Stiffness of left shoulder, not elsewhere classified  At  risk for lymphedema     Problem List Patient Active Problem List   Diagnosis Date Noted  . Lower extremity cellulitis 12/07/2017  . Genetic testing 08/22/2017  . Family history of breast cancer   . Pneumothorax on right 07/18/2017  . Malignant neoplasm of upper-outer quadrant of left breast in female, estrogen receptor positive ([place]) 06/30/2017    Otelia Limes, PTA 05/18/2018, 12:16 PM  [place], [place], [address] Phone: [phone]   Fax:  [phone]  Name: Kathleen Hanson MRN: [id number] Date of Birth: 06/02/1936

## 2018-05-19 ENCOUNTER — Encounter: Payer: Self-pay | Admitting: Adult Health

## 2018-05-19 ENCOUNTER — Telehealth: Payer: Self-pay | Admitting: Adult Health

## 2018-05-19 NOTE — Progress Notes (Signed)
Patient called to obtain information regarding obtaining a sleeve. Advised patient that she must have a prescription from the physical therapist, and the therapist will instruct her where to take the prescription whether it is [facility] or the new vendor [facility]. The vendor will then send the invoice to be paid from the [employer]. She verbalized understanding and was appreciative.

## 2018-05-19 NOTE — Telephone Encounter (Signed)
No 7/25 los. °

## 2018-05-22 ENCOUNTER — Ambulatory Visit: Payer: Medicare HMO

## 2018-05-22 DIAGNOSIS — I972 Postmastectomy lymphedema syndrome: Secondary | ICD-10-CM

## 2018-05-22 DIAGNOSIS — C50412 Malignant neoplasm of upper-outer quadrant of left female breast: Secondary | ICD-10-CM | POA: Diagnosis not present

## 2018-05-22 DIAGNOSIS — Z9189 Other specified personal risk factors, not elsewhere classified: Secondary | ICD-10-CM

## 2018-05-22 DIAGNOSIS — M25612 Stiffness of left shoulder, not elsewhere classified: Secondary | ICD-10-CM

## 2018-05-22 DIAGNOSIS — Z170 Estrogen receptor positive status [ER+]: Principal | ICD-10-CM

## 2018-05-22 DIAGNOSIS — R293 Abnormal posture: Secondary | ICD-10-CM

## 2018-05-22 NOTE — Therapy (Signed)
Kathleen Hanson, [place], [address] Phone: [phone]   Fax:  [phone]  Physical Therapy Treatment  Patient Details  Name: Kathleen Hanson MRN: [id number] Date of Birth: 12-23-1935 Referring Provider: Dr. Stark Klein   Encounter Date: 05/22/2018  PT End of Session - 05/22/18 1217    Visit Number  4    Number of Visits  9    Date for PT Re-Evaluation  06/05/18    PT Start Time  3295 Pt arrived late    PT Stop Time  0938    PT Time Calculation (min)  44 min    Activity Tolerance  Patient tolerated treatment well    Behavior During Therapy  [facility] for tasks assessed/performed       Past Medical History:  Diagnosis Date  . Cancer of left breast ([place])   . Constipation   . Family history of breast cancer   . Fibrocystic breast   . Hyperlipidemia   . Ocular migraine    "used to have them monthly for a period of time; seemed to stop; now have had a couple in the last week" (07/13/2017)    Past Surgical History:  Procedure Laterality Date  . BREAST BIOPSY Right ~ 2001   BREAST EXCISIONAL BIOPSY  . BREAST LUMPECTOMY Left 2001   chemo and radiation  . CESAREAN SECTION  X 1  . IR CATHETER TUBE CHANGE  07/20/2017  . MASTECTOMY COMPLETE / SIMPLE W/ SENTINEL NODE BIOPSY Left 07/13/2017    LEFT MASTECTOMY WITH SENTINEL NODE MAPPING ERAS PATHWAY [name] 07/13/2017  . MASTECTOMY W/ SENTINEL NODE BIOPSY Left 07/13/2017   Procedure: LEFT MASTECTOMY WITH SENTINEL NODE MAPPING ERAS PATHWAY;  Surgeon: [name], MD;  Location: [place];  Service: General;  Laterality: Left;  . PORT-A-CATH REMOVAL N/A 05/11/2018   Procedure: REMOVAL PORT-A-CATH;  Surgeon: Stark Klein, MD;  Location: [place];  Service: General;  Laterality: N/A;  . PORTACATH PLACEMENT Right 07/13/2017  . PORTACATH PLACEMENT Right 07/13/2017   Procedure: INSERTION PORT-A-CATH;  Surgeon: [name], MD;  Location: [place];   Service: General;  Laterality: Right;  . TONSILLECTOMY      There were no vitals filed for this visit.  Subjective Assessment - 05/22/18 0951    Subjective  Took the bandages off this morning to shower. Other than the same area of itching the bandages were ok.     Pertinent History  Lt lumpectomy 2001 with chemo and radiation and then mastectomy 2018 with SLNB 0/2 with chemotherapy again.  Now just on anastrozole.  high cholesterol    Patient Stated Goals  learn what to do for the UE    Currently in Pain?  No/denies                  [facility]patient [facility]  Lymphedema Life Impact Scale Total Score  10.29 %           [facility] Adult PT Treatment/Exercise - 05/22/18 0001      Self-Care   Self-Care  Other Self-Care Comments    Other Self-Care Comments   Discussed with pt at length 2 options for her at this time. Showed her a velcro garment that she could get measured for to wear at day and night (pt tentative about bandaging) having [employer] pay, and then save up for a flat knit. Or we could try bandaging and have [employer] cover her  flat knit now, but she would have to learn to bandage in the mean time since her trip is upcoming and sleeve may or may not arrive. Pt opted to try bandaging today and plans to call [facility] tomorrow (closed today) to try to get measured fora  flat knit ASAP.      Manual Therapy   Compression Bandaging  Lotion and thick stockinette then pt applied 6 and 10 cm short stretch compression bandages today with PTA offering cuing and encouragement throughout. Overall bandage may have been slightly tight but her technique was really very good. PTA wrapped 12 cm due to time.              PT Education - 05/22/18 0957    Education Details  Self compression bandaging     Person(s) Educated  Patient    Methods  Explanation;Demonstration;Tactile cues;Handout    Comprehension  Verbalized  understanding;Returned demonstration;Verbal cues required;Tactile cues required;Need further instruction          PT Long Term Goals - 05/15/18 2126      PT LONG TERM GOAL #1   Title  Pt will be ind with self MLD for continued care    Time  3    Period  Weeks    Status  New    Target Date  06/05/18      PT LONG TERM GOAL #2   Title  Pt will obtain appropriate compression garments to maintain UE reduction    Time  3    Period  Weeks    Status  New    Target Date  06/05/18      PT LONG TERM GOAL #3   Title  Pt will decrease 15 cm proximal to ulnar styloid process to at least 21cm or less            Plan - 05/22/18 1218    Clinical Impression Statement  Spent alot of time at beginning of session answering pts questions and discussing about whether she would like to learn to bandage vs getting velcro garment, and about daytime garment. Last few appointments pt has been back and forth on her decision and seems a bit confused about her options so after today she had a better understanding of what she wanted to do. She is going to try to learn to self bandage today, which I began instructing her on. And she is to call [facility] tomorrow to make an appointment to go ahead and get measured for a compression sleeve in the hoped it will arrive before she leaves next week with [employer] covering htis.     Rehab Potential  Excellent    PT Frequency  3x / week    PT Duration  3 weeks    PT Treatment/Interventions  ADLs/Self Care Home Management;DME Instruction;Therapeutic exercise;Patient/family education;Manual lymph drainage;Compression bandaging;Passive range of motion    PT Next Visit Plan  Assess Lt shoulder ROM; Cont complete decongestive therapy and cont with instruction of bandaging having pt perform/did she get to do this at home ??, and then teach self MLD; issue handout for lymph precautions (instructed her in infection prevention today); check for referral     Consulted and  Agree with Plan of Care  Patient       Patient will benefit from skilled therapeutic intervention in order to improve the following deficits and impairments:  Decreased knowledge of precautions, Increased edema, Decreased knowledge of use of DME  Visit Diagnosis: Carcinoma  of upper-outer quadrant of left breast in female, estrogen receptor positive ([place])  Postmastectomy lymphedema  Abnormal posture  Stiffness of left shoulder, not elsewhere classified  At risk for lymphedema     Problem List Patient Active Problem List   Diagnosis Date Noted  . Lower extremity cellulitis 12/07/2017  . Genetic testing 08/22/2017  . Family history of breast cancer   . Pneumothorax on right 07/18/2017  . Malignant neoplasm of upper-outer quadrant of left breast in female, estrogen receptor positive ([place]) 06/30/2017    Otelia Limes, PTA 05/22/2018, 12:26 PM  [place], [place], [address] Phone: [phone]   Fax:  [phone]  Name: NEA GITTENS MRN: [id number] Date of Birth: [birth year]

## 2018-05-24 ENCOUNTER — Ambulatory Visit: Payer: Medicare HMO

## 2018-05-24 DIAGNOSIS — C50412 Malignant neoplasm of upper-outer quadrant of left female breast: Secondary | ICD-10-CM

## 2018-05-24 DIAGNOSIS — R293 Abnormal posture: Secondary | ICD-10-CM

## 2018-05-24 DIAGNOSIS — M25612 Stiffness of left shoulder, not elsewhere classified: Secondary | ICD-10-CM

## 2018-05-24 DIAGNOSIS — Z170 Estrogen receptor positive status [ER+]: Principal | ICD-10-CM

## 2018-05-24 DIAGNOSIS — Z9189 Other specified personal risk factors, not elsewhere classified: Secondary | ICD-10-CM

## 2018-05-24 DIAGNOSIS — I972 Postmastectomy lymphedema syndrome: Secondary | ICD-10-CM

## 2018-05-24 NOTE — Patient Instructions (Signed)
Deep Effective Breath ° ° °Standing, sitting, or laying down, place both hands on the belly. Take a deep breath IN, expanding the belly; then breath OUT, contracting the belly. °Repeat __5__ times. Do __2-3__ sessions per day and before your self massage. ° °http://gt2.exer.us/866  ° °Copyright © VHI. All rights reserved.  °Axilla to Axilla - Sweep ° ° °On uninvolved side make 5 circles in the armpit, then pump _5__ times from involved armpit across chest to uninvolved armpit, making a pathway. °Do _1__ time per day. ° °Copyright © VHI. All rights reserved.  °Axilla to Inguinal Nodes - Sweep ° ° °On involved side, make 5 circles at groin at panty line, then pump _5__ times from armpit along side of trunk to outer hip, making your other pathway. °Do __1_ time per day. ° °Copyright © VHI. All rights reserved.  °Arm Posterior: Elbow to Shoulder - Sweep ° ° °Pump _5__ times from back of elbow to top of shoulder. Then inner to outer upper arm _5_ times, then outer arm again _5_ times. Then back to the pathways _2-3_ times. °Do _1__ time per day. ° °Copyright © VHI. All rights reserved.  °ARM: [place] Wrist to Elbow - Sweep ° ° °Pump or stationary circles _5__ times from wrist to elbow making sure to do both sides of the forearm. Then retrace your steps to the outer arm, and the pathways _2-3_ times each. °Do _1__ time per day. ° °Copyright © VHI. All rights reserved.  °ARM: Dorsum of Hand to Shoulder - Sweep ° ° °Pump or stationary circles _5__ times on back of hand including knuckle spaces and individual fingers if needed working up towards the wrist, then retrace all your steps working back up the forearm, doing both sides; upper outer arm and back to your pathways _2-3_ times each. Then do 5 circles again at uninvolved armpit and involved groin where you started! Good job!! °Do __1_ time per day. °

## 2018-05-24 NOTE — Therapy (Signed)
[place], [place], [address] Phone: [phone]   Fax:  [phone]  Physical Therapy Treatment  Patient Details  Name: Kathleen Hanson MRN: [id number] Date of Birth: 09-20-1936 Referring Provider: Dr. Stark Klein   Encounter Date: 05/24/2018  PT End of Session - 05/24/18 1220    Visit Number  5    Number of Visits  9    Date for PT Re-Evaluation  06/05/18    PT Start Time  0737 Pt arrived late    PT Stop Time  0933 saw pt again from 1110-1140 so total time 45 mins    PT Time Calculation (min)  35 min    Activity Tolerance  Patient tolerated treatment well    Behavior During Therapy  [facility] for tasks assessed/performed       Past Medical History:  Diagnosis Date  . Cancer of left breast ([place])   . Constipation   . Family history of breast cancer   . Fibrocystic breast   . Hyperlipidemia   . Ocular migraine    "used to have them monthly for a period of time; seemed to stop; now have had a couple in the last week" (07/13/2017)    Past Surgical History:  Procedure Laterality Date  . BREAST BIOPSY Right ~ 2001   BREAST EXCISIONAL BIOPSY  . BREAST LUMPECTOMY Left 2001   chemo and radiation  . CESAREAN SECTION  X 1  . IR CATHETER TUBE CHANGE  07/20/2017  . MASTECTOMY COMPLETE / SIMPLE W/ SENTINEL NODE BIOPSY Left 07/13/2017    LEFT MASTECTOMY WITH SENTINEL NODE MAPPING ERAS PATHWAY [name] 07/13/2017  . MASTECTOMY W/ SENTINEL NODE BIOPSY Left 07/13/2017   Procedure: LEFT MASTECTOMY WITH SENTINEL NODE MAPPING ERAS PATHWAY;  Surgeon: [name], MD;  Location: [place];  Service: General;  Laterality: Left;  . PORT-A-CATH REMOVAL N/A 05/11/2018   Procedure: REMOVAL PORT-A-CATH;  Surgeon: Stark Klein, MD;  Location: [place];  Service: General;  Laterality: N/A;  . PORTACATH PLACEMENT Right 07/13/2017  . PORTACATH PLACEMENT Right 07/13/2017   Procedure: INSERTION PORT-A-CATH;   Surgeon: [name], MD;  Location: [place];  Service: General;  Laterality: Right;  . TONSILLECTOMY      There were no vitals filed for this visit.  Subjective Assessment - 05/24/18 0914    Subjective  Took the bandages off and tried to rewrapped.  It was a little tight at my elbow but overall it was ok.   (Pended)     Pertinent History  Lt lumpectomy 2001 with chemo and radiation and then mastectomy 2018 with SLNB 0/2 with chemotherapy again.  Now just on anastrozole.  high cholesterol  (Pended)     Patient Stated Goals  learn what to do for the UE  (Pended)     Currently in Pain?  No/denies  (Pended)                   Outpatient Rehab from 05/15/2018 in [place]  Lymphedema Life Impact Scale Total Score  10.29 %           [facility] Adult PT Treatment/Exercise - 05/24/18 0001      Manual Therapy   Manual therapy comments  Orthotics fit: Pt came back after being at [place] for PTA to assess fit of sleeve before purchasing. They did not have flat knit in stock so she was put in a circular knit 30-40  mmHg. After discussing with Debbie the fitter and pt it was decided pt would pay out of pocket for circular knit sleeve and gauntlet, but best case is flat knit. So she will be measured for this as well and bill [employer]. (Pt going out of town and is unable to wrap for entire trip so needed some kind of compression before leaving next week)    Compression Bandaging  Spent session today reviewing with pt self bandaging to reassess her technique. She was able to perform this with less tension today (too tight first time) and reports it feeling much more comfortable at her hand and elbow. Pt to go get measured for compression garments after session so did not leave bandages on.              PT Education - 05/24/18 1219    Education Details  Reviewed self compression bandaging and encouraged pt to have her husband help hold the bandages  while she is wrapping.     Person(s) Educated  Patient    Methods  Explanation;Handout;Tactile cues    Comprehension  Verbalized understanding;Returned demonstration;Tactile cues required          PT Long Term Goals - 05/15/18 2126      PT LONG TERM GOAL #1   Title  Pt will be ind with self MLD for continued care    Time  3    Period  Weeks    Status  New    Target Date  06/05/18      PT LONG TERM GOAL #2   Title  Pt will obtain appropriate compression garments to maintain UE reduction    Time  3    Period  Weeks    Status  New    Target Date  06/05/18      PT LONG TERM GOAL #3   Title  Pt will decrease 15 cm proximal to ulnar styloid process to at least 21cm or less            Plan - 05/24/18 1221    Clinical Impression Statement  Reviewed self bandaging with pt today while answering her questions regarding compression garments. Pt has appt to go get measured after session today for compression sleeve and gauntlet. Update after session: Pt returned from [place] with 30-40 mmHg circular knit sleeve (gauntlet will arrive in a few days) for PTA to assess if ok for her trip as flat knit will not arrive before she leaves.  After speaking with fitter Debbie at [employer], pt was agreeable to purchase (fitter heavily discounted this for pt so she could afford it) circular knit sleeve and gauntlet, then later will be measured for flat knit sleeve and gauntlet and to bill [employer]. Pt is to bandage her hand until gauntlet arrives.     Rehab Potential  Excellent    PT Frequency  3x / week    PT Duration  3 weeks    PT Treatment/Interventions  ADLs/Self Care Home Management;DME Instruction;Therapeutic exercise;Patient/family education;Manual lymph drainage;Compression bandaging;Passive range of motion    PT Next Visit Plan  Assess Lt shoulder ROM (was not done at eval, per PT); assess if circular knit is containing pt and remeasure circumference. Instruct pt in self MLD  (handout issued today but no time to have pt perform).    Consulted and Agree with Plan of Care  Patient       Patient will benefit from skilled therapeutic intervention in order to  improve the following deficits and impairments:  Decreased knowledge of precautions, Increased edema, Decreased knowledge of use of DME  Visit Diagnosis: Carcinoma of upper-outer quadrant of left breast in female, estrogen receptor positive ([place])  Postmastectomy lymphedema  Abnormal posture  Stiffness of left shoulder, not elsewhere classified  At risk for lymphedema     Problem List Patient Active Problem List   Diagnosis Date Noted  . Lower extremity cellulitis 12/07/2017  . Genetic testing 08/22/2017  . Family history of breast cancer   . Pneumothorax on right 07/18/2017  . Malignant neoplasm of upper-outer quadrant of left breast in female, estrogen receptor positive ([place]) 06/30/2017    Otelia Limes, PTA 05/24/2018, 12:27 PM  [place], [place], [address] Phone: [phone]   Fax:  [phone]  Name: Kathleen Hanson MRN: [id number] Date of Birth: 07/21/1936

## 2018-05-26 ENCOUNTER — Ambulatory Visit: Payer: Medicare HMO | Attending: General Surgery | Admitting: Physical Therapy

## 2018-05-26 DIAGNOSIS — M25612 Stiffness of left shoulder, not elsewhere classified: Secondary | ICD-10-CM | POA: Diagnosis present

## 2018-05-26 DIAGNOSIS — R293 Abnormal posture: Secondary | ICD-10-CM

## 2018-05-26 DIAGNOSIS — I972 Postmastectomy lymphedema syndrome: Secondary | ICD-10-CM | POA: Insufficient documentation

## 2018-05-26 NOTE — Therapy (Signed)
[facility], [place], [address] Phone: [phone]   Fax:  [phone]  Physical Therapy Treatment  Patient Details  Name: Kathleen Hanson MRN: [id number] Date of Birth: 16-Feb-1936 Referring Provider: Dr. Stark Klein   Encounter Date: 05/26/2018  PT End of Session - 05/26/18 1232    Visit Number  6    Number of Visits  9    Date for PT Re-Evaluation  06/05/18    PT Start Time  1100    PT Stop Time  1150    PT Time Calculation (min)  50 min    Activity Tolerance  Patient tolerated treatment well    Behavior During Therapy  [facility] for tasks assessed/performed       Past Medical History:  Diagnosis Date  . Cancer of left breast ([place])   . Constipation   . Family history of breast cancer   . Fibrocystic breast   . Hyperlipidemia   . Ocular migraine    "used to have them monthly for a period of time; seemed to stop; now have had a couple in the last week" (07/13/2017)    Past Surgical History:  Procedure Laterality Date  . BREAST BIOPSY Right ~ 2001   BREAST EXCISIONAL BIOPSY  . BREAST LUMPECTOMY Left 2001   chemo and radiation  . CESAREAN SECTION  X 1  . IR CATHETER TUBE CHANGE  07/20/2017  . MASTECTOMY COMPLETE / SIMPLE W/ SENTINEL NODE BIOPSY Left 07/13/2017    LEFT MASTECTOMY WITH SENTINEL NODE MAPPING ERAS PATHWAY [name] 07/13/2017  . MASTECTOMY W/ SENTINEL NODE BIOPSY Left 07/13/2017   Procedure: LEFT MASTECTOMY WITH SENTINEL NODE MAPPING ERAS PATHWAY;  Surgeon: [name], MD;  Location: [place];  Service: General;  Laterality: Left;  . PORT-A-CATH REMOVAL N/A 05/11/2018   Procedure: REMOVAL PORT-A-CATH;  Surgeon: Stark Klein, MD;  Location: [place];  Service: General;  Laterality: N/A;  . PORTACATH PLACEMENT Right 07/13/2017  . PORTACATH PLACEMENT Right 07/13/2017   Procedure: INSERTION PORT-A-CATH;  Surgeon: [name], MD;  Location: [place];  Service: General;   Laterality: Right;  . TONSILLECTOMY      There were no vitals filed for this visit.  Subjective Assessment - 05/26/18 1216    Subjective  Pt states she wore her circular knit sleeve without a hand piece and noticed her hand swelled up so her wrapped her hand.  She slept in the circular knit sleeve last night     Pertinent History  Lt lumpectomy 2001 with chemo and radiation and then mastectomy 2018 with SLNB 0/2 with chemotherapy again.  Now just on anastrozole.  high cholesterol    Patient Stated Goals  learn what to do for the UE         [facility] PT Assessment - 05/26/18 0001      AROM   Right Shoulder Flexion  168 Degrees    Right Shoulder ABduction  165 Degrees    Left Shoulder Flexion  157 Degrees    Left Shoulder ABduction  160 Degrees        LYMPHEDEMA/ONCOLOGY QUESTIONNAIRE - 05/26/18 1114      Left Upper Extremity Lymphedema   15 cm Proximal to Olecranon Process  28 cm    10 cm Proximal to Olecranon Process  27 cm    Olecranon Process  25 cm    15 cm Proximal to Ulnar Styloid Process  22.5 cm    10 cm  Proximal to Ulnar Styloid Process  19 cm    Just Proximal to Ulnar Styloid Process  14.5 cm    Across Hand at [employer]  17.1 cm    At [place] of 2nd Digit  5.7 cm           Outpatient Re[facility]  Lymphedema Life Impact Scale Total Score  10.29 %           [facility] Adult PT Treatment/Exercise - 05/26/18 0001      Manual Therapy   Manual Therapy  Edema management;Manual Lymphatic Drainage (MLD);Compression Bandaging    Edema Management  Remeasured left arm circumference Reviewed elevation and finger exercise for pt to do if her hand is swelling while she is on vacation.  Called [facility] and they have a loaner gauntlet for pt since hers is backordered. Instructed emphatically not to wear circular knit to sleep in at night     Manual Lymphatic Drainage (MLD)  Briefly reviewed self manual lymph  drainage, but pt has many questions and will need a more thoroughly reviewed.     Compression Bandaging  provided piece of small tg soft for pt to wear on hand when she is wrapping the hand only.  Watched while pt wrapped her left hand with 6 cm short stretch bandage and provided verbal and tactile cues              PT Education - 05/26/18 1231    Education Details  Do not sleep incircular knit sleeve     Person(s) Educated  Patient    Methods  Explanation    Comprehension  Verbalized understanding          PT Long Term Goals - 05/15/18 2126      PT LONG TERM GOAL #1   Title  Pt will be ind with self MLD for continued care    Time  3    Period  Weeks    Status  New    Target Date  06/05/18      PT LONG TERM GOAL #2   Title  Pt will obtain appropriate compression garments to maintain UE reduction    Time  3    Period  Weeks    Status  New    Target Date  06/05/18      PT LONG TERM GOAL #3   Title  Pt will decrease 15 cm proximal to ulnar styloid process to at least 21cm or less            Plan - 05/26/18 1232    Clinical Impression Statement  Pt still has many questions and is not independent in self manual lymph drainage or bandaging.  She will get a loaner gauntlet today and was instructed to wear circular knit sleeve and gauntlet for daytime ONLY . Alos encourged her to monitor her skin closely as she has some redness at antecubital fossa when sleeve was removed     PT Frequency  3x / week    PT Next Visit Plan   Instruct pt in self MLD  and exercise for UE for her to do while on vacation . Set up appointment for reassessment for when she gets back in town       Patient will benefit from skilled therapeutic intervention in order to improve the following deficits and impairments:  Decreased knowledge of precautions, Increased edema, Decreased knowledge of use of DME  Visit  Diagnosis: Postmastectomy lymphedema  Abnormal posture  Stiffness of left  shoulder, not elsewhere classified     Problem List Patient Active Problem List   Diagnosis Date Noted  . Lower extremity cellulitis 12/07/2017  . Genetic testing 08/22/2017  . Family history of breast cancer   . Pneumothorax on right 07/18/2017  . Malignant neoplasm of upper-outer quadrant of left breast in female, estrogen receptor positive ([place]) 06/30/2017   Donato Heinz. Owens Shark PT  Norwood Levo 05/26/2018, 12:39 PM  [place], [place], [address] Phone: [phone]   Fax:  [phone]  Name: Kathleen Hanson MRN: [id number] Date of Birth: 1936-05-10

## 2018-05-29 ENCOUNTER — Ambulatory Visit: Payer: Medicare HMO

## 2018-05-29 DIAGNOSIS — I972 Postmastectomy lymphedema syndrome: Secondary | ICD-10-CM | POA: Diagnosis not present

## 2018-05-29 DIAGNOSIS — M25612 Stiffness of left shoulder, not elsewhere classified: Secondary | ICD-10-CM

## 2018-05-29 DIAGNOSIS — R293 Abnormal posture: Secondary | ICD-10-CM

## 2018-05-29 NOTE — Patient Instructions (Signed)
Cancer Rehab [phone] Start with circles near neck, with palm on collarbones. 10 times on each side Deep Effective Breath   Standing, sitting, or laying down, place both hands on the belly. Take a deep breath IN, expanding the belly; then breath OUT, contracting the belly. Repeat __5__ times. Do __2-3__ sessions per day and before your self massage.   Axilla to Axilla - Sweep   On uninvolved side make 5 circles in the armpit, then pump (strech skin, then rest moving along pathway) _5__ times from involved armpit across chest to uninvolved armpit, making a pathway. Do _1__ time per day.  Axilla to Inguinal Nodes - Sweep   On involved side, make 5 circles at groin at panty line (can use one hand on top of other for better pressure), then pump (stretch skin, then rest moving along pathway) _5__ times from armpit along side of trunk to outer hip, making your other pathway. Is best to use Rt hand for upper part of pathway, then finish with Lt hand for lower part.  Do __1_ time per day.  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 Then pump from inner to outer upper arm _5_ times being sure to do entire inner arm, then outer upper arm again _5_ times (clear [place] again). Then back to the pathways _2-3_ times. Do _1__ time per day.  ARM: Forearm to Elbow - Sweep   Pump  _5__ times from wrist to elbow making sure to do both sides of the forearm. Then retrace your steps to the outer upper arm(send the fluid up [place]!), and the pathways _2-3_ times each. Do _1__ time per day.  Copyright  VHI. All rights reserved.  ARM: Dorsum of Hand to Shoulder - Sweep   Pump or stationary circles _5__ times on back of hand including knuckle spaces and pumping individual fingers if needed working fluid up towards the wrist, then retrace all your steps working back up the forearm, doing both sides; upper outer  arm ([place] one more time) and back to your pathways _2-3_ times each. Then do 5 circles again at uninvolved armpit and involved groin where you started! Good job!! Do __1_ time per day.

## 2018-05-29 NOTE — Therapy (Signed)
[place], [place], [address] Phone: [phone]   Fax:  [phone]  Physical Therapy Treatment  Patient Details  Name: Kathleen Hanson MRN: [id number] Date of Birth: 1936-03-05 Referring Provider: Dr. Stark Klein   Encounter Date: 05/29/2018  PT End of Session - 05/29/18 1034    Visit Number  7    Number of Visits  9    Date for PT Re-Evaluation  06/05/18    PT Start Time  0851    PT Stop Time  0937    PT Time Calculation (min)  46 min    Activity Tolerance  Patient tolerated treatment well    Behavior During Therapy  [facility] for tasks assessed/performed       Past Medical History:  Diagnosis Date  . Cancer of left breast ([place])   . Constipation   . Family history of breast cancer   . Fibrocystic breast   . Hyperlipidemia   . Ocular migraine    "used to have them monthly for a period of time; seemed to stop; now have had a couple in the last week" (07/13/2017)    Past Surgical History:  Procedure Laterality Date  . BREAST BIOPSY Right ~ 2001   BREAST EXCISIONAL BIOPSY  . BREAST LUMPECTOMY Left 2001   chemo and radiation  . CESAREAN SECTION  X 1  . IR CATHETER TUBE CHANGE  07/20/2017  . MASTECTOMY COMPLETE / SIMPLE W/ SENTINEL NODE BIOPSY Left 07/13/2017    LEFT MASTECTOMY WITH SENTINEL NODE MAPPING ERAS PATHWAY [name] 07/13/2017  . MASTECTOMY W/ SENTINEL NODE BIOPSY Left 07/13/2017   Procedure: LEFT MASTECTOMY WITH SENTINEL NODE MAPPING ERAS PATHWAY;  Surgeon: [name], MD;  Location: [place];  Service: General;  Laterality: Left;  . PORT-A-CATH REMOVAL N/A 05/11/2018   Procedure: REMOVAL PORT-A-CATH;  Surgeon: Stark Klein, MD;  Location: [place];  Service: General;  Laterality: N/A;  . PORTACATH PLACEMENT Right 07/13/2017  . PORTACATH PLACEMENT Right 07/13/2017   Procedure: INSERTION PORT-A-CATH;  Surgeon: [name], MD;  Location: [place];  Service: General;   Laterality: Right;  . TONSILLECTOMY      There were no vitals filed for this visit.  Subjective Assessment - 05/29/18 0855    Subjective  I got the loaner gauntlet and wore that all day saturday and sunday and my arm is doing better. I need to review the self MLD.     Pertinent History  Lt lumpectomy 2001 with chemo and radiation and then mastectomy 2018 with SLNB 0/2 with chemotherapy again.  Now just on anastrozole.  high cholesterol    Patient Stated Goals  learn what to do for the UE    Currently in Pain?  No/denies            LYMPHEDEMA/ONCOLOGY QUESTIONNAIRE - 05/29/18 0858      Left Upper Extremity Lymphedema   15 cm Proximal to Olecranon Process  26.9 cm    10 cm Proximal to Olecranon Process  26.4 cm    Olecranon Process  25 cm    15 cm Proximal to Ulnar Styloid Process  22.2 cm    10 cm Proximal to Ulnar Styloid Process  19.2 cm    Just Proximal to Ulnar Styloid Process  14.8 cm    Across Hand at [employer]  17 cm    At [place] of 2nd Digit  5.6 cm  Outpatient Re[facility] from [facility]  Lymphedema Life Impact Scale Total Score  10.29 %           [facility] Adult PT Treatment/Exercise - 05/29/18 0001      Manual Therapy   Manual Lymphatic Drainage (MLD)  Reviewed self manual lymph drainage per handout with having pt perform each step with hand over hand instruction and VCs.              PT Education - 05/29/18 [phone]    Education Details  Reviewed self MLD following handout and issued new one with more directions that were understandable to pt    Person(s) Educated  Patient    Methods  Explanation;Demonstration;Tactile cues;Handout    Comprehension  Verbalized understanding;Returned demonstration;Verbal cues required;Tactile cues required;Need further instruction          PT Long Term Goals - 05/26/18 1237      PT LONG TERM GOAL #1   Title  Pt will be ind with self MLD for continued care     Time  3    Period  Weeks    Status  On-going      PT LONG TERM GOAL #2   Title  Pt will obtain appropriate compression garments to maintain UE reduction    Time  3    Period  Weeks    Status  On-going      PT LONG TERM GOAL #3   Title  Pt will decrease 15 cm proximal to ulnar styloid process to at least 21cm or less    Baseline  22.5 on 05/26/2018    Time  3    Period  Weeks    Status  On-going            Plan - 05/29/18 1035    Clinical Impression Statement  Spent entire session (after briefly assessing fit of compression garments which is good) reviewing self MLD with pt having her perform each step and updating her handout with descriptions she understood. issued new handout at end of session and pt reported feeling better about her technique. She was also definitely demonstrating better techique as well. Pt was able to verbalize good understanding of sequence by end of session as well. Pt to make one more appt after returning from trip in a few weeks for final assess.     Rehab Potential  Excellent    PT Frequency  3x / week    PT Duration  3 weeks    PT Treatment/Interventions  ADLs/Self Care Home Management;DME Instruction;Therapeutic exercise;Patient/family education;Manual lymph drainage;Compression bandaging;Passive range of motion    PT Next Visit Plan  Pt out of town for next few weeks. Made one final appt after returning for final assess of review of self MLD.    Consulted and Agree with Plan of Care  Patient       Patient will benefit from skilled therapeutic intervention in order to improve the following deficits and impairments:  Decreased knowledge of precautions, Increased edema, Decreased knowledge of use of DME  Visit Diagnosis: Postmastectomy lymphedema  Abnormal posture  Stiffness of left shoulder, not elsewhere classified     Problem List Patient Active Problem List   Diagnosis Date Noted  . Lower extremity cellulitis 12/07/2017  . Genetic  testing 08/22/2017  . Family history of breast cancer   . Pneumothorax on right 07/18/2017  . Malignant neoplasm of upper-outer quadrant of left breast in female, estrogen receptor positive ([place]) 06/30/2017  Collie Siad Ann,PTA 05/29/2018, 10:43 AM  [place], [place], [address] Phone: [phone]   Fax:  [phone]  Name: SUETTA HOFFMEISTER MRN: [id number] Date of Birth: 29-Mar-1936

## 2018-05-31 ENCOUNTER — Other Ambulatory Visit: Payer: Medicare HMO

## 2018-06-21 ENCOUNTER — Ambulatory Visit
Admission: RE | Admit: 2018-06-21 | Discharge: 2018-06-21 | Disposition: A | Discharge status: Home / Self Care | Payer: Medicare HMO | Source: Ambulatory Visit | Attending: Hematology | Admitting: Hematology

## 2018-06-21 ENCOUNTER — Ambulatory Visit
Admission: RE | Admit: 2018-06-21 | Discharge: 2018-06-21 | Disposition: A | Discharge status: Home / Self Care | Payer: Medicare HMO | Source: Ambulatory Visit | Attending: General Surgery | Admitting: General Surgery

## 2018-06-21 ENCOUNTER — Ambulatory Visit: Payer: Medicare HMO

## 2018-06-21 DIAGNOSIS — Z170 Estrogen receptor positive status [ER+]: Principal | ICD-10-CM

## 2018-06-21 DIAGNOSIS — N6489 Other specified disorders of breast: Secondary | ICD-10-CM

## 2018-06-21 DIAGNOSIS — C50412 Malignant neoplasm of upper-outer quadrant of left female breast: Secondary | ICD-10-CM

## 2018-06-28 ENCOUNTER — Ambulatory Visit: Payer: Medicare HMO | Attending: General Surgery

## 2018-06-28 DIAGNOSIS — I972 Postmastectomy lymphedema syndrome: Secondary | ICD-10-CM | POA: Insufficient documentation

## 2018-06-28 DIAGNOSIS — M25612 Stiffness of left shoulder, not elsewhere classified: Secondary | ICD-10-CM | POA: Insufficient documentation

## 2018-06-28 DIAGNOSIS — R293 Abnormal posture: Secondary | ICD-10-CM | POA: Insufficient documentation

## 2018-06-28 NOTE — Therapy (Addendum)
[place], [place], [address] Phone: [phone]   Fax:  [phone]  Physical Therapy Treatment  Patient Details  Name: Kathleen Hanson MRN: [id number] Date of Birth: 12-13-1935 Referring Provider: Dr. Stark Klein   Encounter Date: 06/28/2018  PT End of Session - 06/28/18 1148    Visit Number  8    Number of Visits  9    Date for PT Re-Evaluation  06/05/18    PT Start Time  1022    PT Stop Time  1107    PT Time Calculation (min)  45 min    Activity Tolerance  Patient tolerated treatment well    Behavior During Therapy  [facility] for tasks assessed/performed       Past Medical History:  Diagnosis Date  . Cancer of left breast ([place])   . Constipation   . Family history of breast cancer   . Fibrocystic breast   . Hyperlipidemia   . Ocular migraine    "used to have them monthly for a period of time; seemed to stop; now have had a couple in the last week" (07/13/2017)    Past Surgical History:  Procedure Laterality Date  . BREAST BIOPSY Right ~ 2001  . BREAST EXCISIONAL BIOPSY Right   . BREAST LUMPECTOMY Left 2001   chemo and radiation  . CESAREAN SECTION  X 1  . IR CATHETER TUBE CHANGE  07/20/2017  . MASTECTOMY COMPLETE / SIMPLE W/ SENTINEL NODE BIOPSY Left 07/13/2017    LEFT MASTECTOMY WITH SENTINEL NODE MAPPING ERAS PATHWAY [name] 07/13/2017  . MASTECTOMY W/ SENTINEL NODE BIOPSY Left 07/13/2017   Procedure: LEFT MASTECTOMY WITH SENTINEL NODE MAPPING ERAS PATHWAY;  Surgeon: [name], MD;  Location: [place];  Service: General;  Laterality: Left;  . PORT-A-CATH REMOVAL N/A 05/11/2018   Procedure: REMOVAL PORT-A-CATH;  Surgeon: Stark Klein, MD;  Location: [place];  Service: General;  Laterality: N/A;  . PORTACATH PLACEMENT Right 07/13/2017  . PORTACATH PLACEMENT Right 07/13/2017   Procedure: INSERTION PORT-A-CATH;  Surgeon: [name], MD;  Location: [place];  Service:  General;  Laterality: Right;  . TONSILLECTOMY      There were no vitals filed for this visit.  Subjective Assessment - 06/28/18 1033    Subjective  I feel like my swelling did okay during my trip. I had a day or so that I didn't wear my sleeve and occassinially did my self MLD.     Pertinent History  Lt lumpectomy 2001 with chemo and radiation and then mastectomy 2018 with SLNB 0/2 with chemotherapy again.  Now just on anastrozole.  high cholesterol    Patient Stated Goals  learn what to do for the UE    Currently in Pain?  No/denies            LYMPHEDEMA/ONCOLOGY QUESTIONNAIRE - 06/28/18 1034      Left Upper Extremity Lymphedema   15 cm Proximal to Olecranon Process  26.2 cm    10 cm Proximal to Olecranon Process  26 cm    Olecranon Process  24.7 cm    15 cm Proximal to Ulnar Styloid Process  23 cm    10 cm Proximal to Ulnar Styloid Process  19.6 cm    Just Proximal to Ulnar Styloid Process  14.9 cm    Across Hand at [employer]  16.9 cm    At [place] of 2nd Digit  5.7 cm  Outpatient Rehab from [facility]/15/2018 [facility]  Lymphedema Life Impact Scale Total Score  10.29 %           [facility] Adult PT Treatment/Exercise - 06/28/18 0001      Manual Therapy   Manual therapy comments  Pt brought her new compresion sleeve and gauntlet and watched her don as she repotrs her Rt shoulder as been hurting recently when she does this. Was able to better instruct her to decresae Rt shoulder hike which she was doing alot when donning upper part of sleeve. Also how to use her rubber glove to don sleeve easier and she reports combined techniques were easier and decreased her Rt shoulder discomfort.     Manual Lymphatic Drainage (MLD)  Reviewed self manual lymph drainage per handout with having pt perform each step with hand over hand instruction and VCs.                   PT Long Term Goals - 06/28/18 1149      PT LONG TERM GOAL  #1   Title  Pt will be ind with self MLD for continued care    Baseline  After final review today pt reports feeling much more adapt with this-06/28/18    Status  Achieved      PT LONG TERM GOAL #2   Title  Pt will obtain appropriate compression garments to maintain UE reduction    Baseline  Pt has these and was instructed in easier techniques for this today-06/28/18    Status  Achieved      PT LONG TERM GOAL #3   Title  Pt will decrease 15 cm proximal to ulnar styloid process to at least 21cm or less    Baseline  22.5 on 05/26/2018; 23 cm-06/28/18 (though tissue was less fibrotic today)    Status  Not Met            Plan - 06/28/18 1151    Clinical Impression Statement  Pt returned for final assess after returning from her trip. She reports wearing compression garments almost every day  but could have been more compliant with performing self MLD. Her arm circumference overall was reduced except at her forearm where she has the most issue. However this was less fibrotic today and pt reports this area has felt looser in general (tissue not so tight). Overall she is pleased wiht ehr progress andis now independent with Maintenance Phase of treatment and though didn't meet circumference goal, she is ready for D/C at this time.     Rehab Potential  Excellent    PT Frequency  3x / week    PT Duration  3 weeks    PT Treatment/Interventions  ADLs/Self Care Home Management;DME Instruction;Therapeutic exercise;Patient/family education;Manual lymph drainage;Compression bandaging;Passive range of motion    PT Next Visit Plan  D/C this visit.     Consulted and Agree with Plan of Care  Patient       Patient will benefit from skilled therapeutic intervention in order to improve the following deficits and impairments:  Decreased knowledge of precautions, Increased edema, Decreased knowledge of use of DME  Visit Diagnosis: Postmastectomy lymphedema  Abnormal posture  Stiffness of left shoulder, not  elsewhere classified     Problem List Patient Active Problem List   Diagnosis Date Noted  . Lower extremity cellulitis 12/07/2017  . Genetic testing 08/22/2017  . Family history of breast cancer   . Pneumothorax on right 07/18/2017  .  Malignant neoplasm of upper-outer quadrant of left breast in female, estrogen receptor positive ([place]) 06/30/2017    Otelia Limes, PTA 06/28/2018, 11:57 AM  [place], [place], [address] Phone: [phone]   Fax:  [phone]  Name: Kathleen Hanson MRN: [id number] Date of Birth: September 10, 1936  PHYSICAL THERAPY DISCHARGE SUMMARY  Visits from Start of Care: 8  Current functional level related to goals / functional outcomes: As Kathleen Hanson   Remaining deficits: As above   Education / Equipment: As above Plan: Patient agrees to discharge.  Patient goals were partially met. Patient is being discharged due to being pleased with the current functional level.  ?????    Maudry Diego, PT 07/05/18 10:37 AM

## 2018-07-07 ENCOUNTER — Other Ambulatory Visit: Payer: Self-pay

## 2018-07-07 DIAGNOSIS — C50412 Malignant neoplasm of upper-outer quadrant of left female breast: Secondary | ICD-10-CM

## 2018-07-07 DIAGNOSIS — Z170 Estrogen receptor positive status [ER+]: Principal | ICD-10-CM

## 2018-07-07 MED ORDER — ANASTROZOLE 1 MG PO TABS: 1.0000 mg | ORAL_TABLET | Freq: Every day | ORAL | 1 refills | Status: DC

## 2018-07-13 ENCOUNTER — Other Ambulatory Visit: Payer: Self-pay | Admitting: Hematology

## 2018-07-18 NOTE — Progress Notes (Signed)
[place]  [phone] Fax:([phone]) [phone]  Clinic Follow Up Note   Patient Care Team: Puglisi, Malachy Chamber, FNP as PCP - General (Nurse Practitioner) [name], MD as Consulting Physician (General Surgery) [name], MD as Consulting Physician (Hematology) [name], MD as Consulting Physician (Radiation Oncology) Gardenia Phlegm, NP as Nurse Practitioner (Hematology and Oncology)   Date of Service:  07/21/2018   CHIEF COMPLAINTS:  F/u for left breast cancer   Oncology History   Cancer Staging Malignant neoplasm of upper-outer quadrant of left breast in female, estrogen receptor positive ([place]) Staging form: Breast, AJCC 8th Edition - Clinical stage from 07/06/2017: Stage IA (cT1c, cN0, cM0, G3, ER: Positive, PR: Positive, HER2: Positive) - Unsigned Staging comments: Staged at breast conference 9.12.18 - Pathologic stage from 07/13/2017: Stage IA (pT2, pN0, cM0, G3, ER: Positive, PR: Positive, HER2: Positive) - Signed by [name], MD on 08/16/2017       Malignant neoplasm of upper-outer quadrant of left breast in female, estrogen receptor positive ([place])   06/23/2017 Mammogram    Diagnostic mammogram 06/23/17 IMPRESSION: Indeterminate mass with irregular margins at the 2:30 position 4 cm from nipple measuring 1.4 x 0.8 x 1.2 cm in the upper-outer left breast.    06/28/2017 Initial Biopsy    Diagnosis 06/28/17 Breast, left, needle core biopsy, upper outer quadrant, 2:30 o'clock, 4cm from nipple - INVASIVE MAMMARY CARCINOMA WITH CALCIFICATIONS, SEE COMMENT.    06/28/2017 Receptors her2    Estrogen Receptor: 100%, POSITIVE, STRONG STAINING INTENSITY Progesterone Receptor: 20%, POSITIVE, STRONG STAINING INTENSITY Proliferation Marker Ki67: 25% HER2: Postive     06/30/2017 Initial Diagnosis    Malignant neoplasm of upper-outer quadrant of left breast in female, estrogen receptor positive ([place])    07/13/2017 Surgery    Left mastectomy with  SLN biopsy performed by Dr Marlou Starks.     07/13/2017 Pathology Results    Diagnosis  1. Breast, simple mastectomy, Left - INVASIVE AND IN SITU DUCTAL CARCINOMA, 4 CM, MSBR GRADE 3. - MARGINS NOT INVOLVED. - SEE ONCOLOGY TABLE. 2. Lymph node, sentinel, biopsy, Left #1 - ONE BENIGN LYMPH NODE (0/1). 3. Lymph node, biopsy, Left #2 - ONE BENIGN LYMPH NODE (0/1).    07/18/2017 - 07/27/2017 Hospital Admission    Admit date: 07/18/17 Admission diagnosis: spontaneous PNX Additional comments: the patient was admitted with a pneumothorax several days after a port was placed although her post procedure cxr was normal. She had a chest tube placed but had a persistent air leak. The pleurivac was placed to 40 cm h2o suction for several days and the pneumothorax finally resolved.     08/15/2017 Imaging    CT A/P IMPRESSION: 1. No CT findings to suggest metastatic disease involving the abdomen/pelvis. 2. Left chest wall fluid collection as discussed above. 3. Small right pleural effusion with minimal overlying atelectasis. 4. Cholelithiasis. 5. Small periumbilical abdominal wall hernia containing fat.     08/15/2017 Imaging    NM Whole Body Bone Scan FINDINGS: Uptake at the LEFT lateral aspects of the cervical and lower thoracic spine, typically degenerative.  Uptake at the shoulders, elbows, wrists, hips, and knees, typically degenerative.  Asymmetric uptake at the sternoclavicular joints RIGHT greater than LEFT which may also be degenerative.  No definite foci of abnormal osseous tracer accumulation are identified which are suspicious for osseous metastases.  Biconvex thoracolumbar scoliosis.  Expected urinary tract and soft tissue distribution of tracer.  IMPRESSION: Scattered likely degenerative type uptake as above.  No definite scintigraphic evidence of osseous metastatic disease.    08/16/2017 Genetic Testing    The patient had genetic testing due to a personal and  family history of breast cancer.  The [employer] Multi-Cancer Panel was ordered. The Multi-Cancer Panel offered by [employer] includes sequencing and/or deletion duplication testing of the following 83 genes: ALK, APC, ATM, AXIN2,BAP1,  BARD1, BLM, BMPR1A, BRCA1, BRCA2, BRIP1, CASR, CDC73, CDH1, CDK4, CDKN1B, CDKN1C, CDKN2A (p14ARF), CDKN2A (p16INK4a), CEBPA, CHEK2, CTNNA1, DICER1, DIS3L2, EGFR (c.2369C>T, p.Thr790Met variant only), EPCAM (Deletion/duplication testing only), FH, FLCN, GATA2, GPC3, GREM1 (Promoter region deletion/duplication testing only), HOXB13 (c.251G>A, p.Gly84Glu), HRAS, KIT, MAX, MEN1, MET, MITF (c.952G>A, p.Glu318Lys variant only), MLH1, MSH2, MSH3, MSH6, MUTYH, NBN, NF1, NF2, NTHL1, PALB2, PDGFRA, PHOX2B, PMS2, POLD1, POLE, POT1, PRKAR1A, PTCH1, PTEN, RAD50, RAD51C, RAD51D, RB1, RECQL4, RET, RUNX1, SDHAF2, SDHA (sequence changes only), SDHB, SDHC, SDHD, SMAD4, SMARCA4, SMARCB1, SMARCE1, STK11, SUFU, TERC, TERT, TMEM127, TP53, TSC1, TSC2, VHL, WRN and WT1.   Results: Negative, no pathogenic variants identified in the genes analyzed.  The date of this test report is 08/16/2017.       08/26/2017 - 03/24/2018 Chemotherapy    adjuvant TCH with Onpro q3weeks for 4 cycles ending on 11/04/17 followed herceptin maintenance therapy every 3 weeks starting 12/28/17 and comepleted after 10 cycles on 03/24/18    08/31/2017 Breast US    On physical exam,there is a firm smooth mass in the low left axilla spanning at least 5 cm.  Ultrasound is performed, showing a complicated fluid collection which is predominantly anechoic measuring 5.2 x 3.9 x 3.5 cm. No blood flow was identified within the soft tissue component of the mass on color Doppler imaging. The appearance is most consistent with a postoperative seroma/resolving hematoma.  IMPRESSION: The complex fluid collection in the left axilla is very likely to represent a postoperative seroma/ resolving hematoma.  RECOMMENDATION: Three-month  follow-up left axillary ultrasound is recommended.     03/13/2018 -  Anti-estrogen oral therapy    Anastrozole daily    05/11/2018 Procedure    PAC removal by Dr. Lynelle Doctor on 05/11/18    05/18/2018 Survivorship    Survivorship Clinic with NP Jamelle Haring on 05/18/18     HISTORY OF PRESENTING ILLNESS: 07/06/17 Kathleen Hanson 82 y.o. female is here because of newly diagnosed Malignant neoplasm of upper-outer quadrant of left breast in female, estrogen receptor positive. She presents to Breast Clinic alone today.   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 Her sister had breast cancer when she was 38. Her father had bladder cancer due to smoking. She denies any other significant medical history. She smoked from [age] yo, less than a pack.   Today she reports to feeling her lump after her mammogram and she does not have any pain.  She denies change in weight, appetite or pain. She has been active with weight watchers and has purposefully lost weight.  She did not ask husband to attend but he would if she needed him to. She has two children, daughter and adopted son. She is retired, but previously a Pharmacist, hospital. 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 She wears hearing aids.    GYN HISTORY  Menarchal: 13-14 LMP: early 65s in 1992 Contraceptive: No HRT: No GP: G2P1A1, prior miscarriage, Has daughter and adopted son   CURRENT THERAPY:  Adjuvant Anastrozole daily starting 03/13/18   INTERVAL HISTORY:  Kathleen Hanson is here for a follow up for left breast cancer. She was last seen by me 4 months ago. Since last visit she attended Survivorship clinic on 05/18/18.  She presents to the clinic today by herself. She notes her energy is back to her pre-cancer baseline.  She notes she wears left arm compression sleeve and does self massage from her PT. She recently has ache in her right shoulder muscles  which she thinks it may be from her massages. She has limited posterior ROM of her right shoulder. She notes she has injured this shoulder in the past and recovered after PT before. She will follow up with her current PT about this as this aches in her right shoulder can keep her up at night. She has been taking Ibuprofen 323m for this.  She has a healing cut on her left anterior ankle from hitting it on her dishwasher. She denies swelling on her ankles. She is still taking lasix given by her PCP. She notes she has been trying to lose weight and adjusting her diet.    MEDICAL HISTORY:  Past Medical History:  Diagnosis Date  . [place] of [place]   . Constipation   . Family history of breast cancer   . Fibrocystic breast   . Hyperlipidemia   . Ocular migraine    "used to have them monthly for a period of time; seemed to stop; now have had a couple in the last week" (07/13/2017)    SURGICAL HISTORY: Past Surgical History:  Procedure Laterality Date  . BREAST BIOPSY Right ~ 2001  . BREAST EXCISIONAL BIOPSY Right   . BREAST LUMPECTOMY Left 2001   chemo and radiation  . CESAREAN SECTION  X 1  . IR CATHETER TUBE CHANGE  07/20/2017  . MASTECTOMY COMPLETE / SIMPLE W/ SENTINEL NODE BIOPSY Left 07/13/2017    LEFT MASTECTOMY WITH SENTINEL NODE MAPPING ERAS PATHWAY /[name]9/19/2018  . MASTECTOMY W/ SENTINEL NODE BIOPSY Left 07/13/2017   Procedure: LEFT MASTECTOMY WITH SENTINEL NODE MAPPING ERAS PATHWAY;  Surgeon: TJovita Kussmaul MD;  [facility]  Service: General;  Laterality: Left;  . PORT-A-CATH REMOVAL N/A 05/11/2018   Procedure: REMOVAL PORT-A-CATH;  Surgeon: BStark Klein MD;  [facility]  Service: General;  Laterality: N/A;  . PORTACATH PLACEMENT Right 07/13/2017  . PORTACATH PLACEMENT Right 07/13/2017   Procedure: INSERTION PORT-A-CATH;  Surgeon: TJovita Kussmaul MD;  [facility]  Service: General;  Laterality: Right;  . TONSILLECTOMY      SOCIAL  HISTORY: Social History   Socioeconomic History  . Marital status: Married    Spouse name: Not on file  . Number of children: Not on file  . Years of education: Not on file  . Highest education level: Not on file  Occupational History  . Not on file  Social Needs  . Financial resource strain: Not on file  . Food insecurity:    Worry: Not on file    Inability: Not on file  . Transportation needs:    Medical: Not on file    Non-medical: Not on file  Tobacco Use  . Smoking status: Former Smoker    Packs/day: 0.50    Years: 15.00    Pack years: 7.50    Types: Cigarettes    Last attempt to quit: 1976    Years since quitting: 43.7  . Smokeless tobacco: Never Used  Substance and Sexual Activity  . Alcohol use: Yes  Alcohol/week: 5.0 standard drinks    Types: 5 Glasses of wine per week    Comment: social  . Drug use: No  . Sexual activity: Not Currently  Lifestyle  . Physical activity:    Days per week: Not on file    Minutes per session: Not on file  . Stress: Not on file  Relationships  . Social connections:    Talks on phone: Not on file    Gets together: Not on file    Attends religious service: Not on file    Active member of club or organization: Not on file    Attends meetings of clubs or organizations: Not on file    Relationship status: Not on file  . Intimate partner violence:    Fear of current or ex partner: Not on file    Emotionally abused: Not on file    Physically abused: Not on file    Forced sexual activity: Not on file  Other Topics Concern  . Not on file  Social History Narrative  . Not on file    FAMILY HISTORY: Family History  Problem Relation Age of Onset  . Cancer Father        bladder cancer  . Breast cancer Sister 74    ALLERGIES:  has No Known Allergies.  MEDICATIONS:  Current Outpatient Medications  Medication Sig Dispense Refill  . anastrozole (ARIMIDEX) 1 MG tablet Take 1 tablet (1 mg total) by mouth daily. 90 tablet 1    . Cholecalciferol (VITAMIN D3) 2000 units TABS Take by mouth.    . furosemide (LASIX) 20 MG tablet Take 1 tablet (20 mg total) by mouth daily. 10 tablet 0  . Multiple Vitamin (MULTIVITAMIN WITH MINERALS) TABS tablet Take 1 tablet by mouth daily.    . polyethylene glycol powder (GLYCOLAX/MIRALAX) powder MIX 17 GRAMS IN LIQUID AND DRINK DAILY IN THE EVENING    . potassium chloride SA (K-DUR,KLOR-CON) 20 MEQ tablet Take 1 tablet (20 mEq total) by mouth 2 (two) times daily. 10 tablet 0  . simvastatin (ZOCOR) 40 MG tablet Take 40 mg daily by mouth.    . vitamin C (ASCORBIC ACID) 500 MG tablet Take 500 mg by mouth daily.     No current facility-administered medications for this visit.     REVIEW OF SYSTEMS:   Constitutional: Denies fevers, chills or abnormal night sweats  (+) Slight purposeful weight loss Eyes: Denies blurriness of vision, double vision or watery eyes Ears, nose, mouth, throat, and face: Denies mucositis or sore throat (+) wears hearing aids Respiratory: Denies cough, dyspnea or wheezes Cardiovascular: Denies palpitation, chest discomfort  Gastrointestinal:  Denies nausea or change in bowel habits   Skin: Denies rashes MSK: (+) Right shoulder muscle ache  Lymphatics: Denies new lymphadenopathy or easy bruising (+) very mild left arm lymphedema  Neurological:Denies numbness, tingling or new weaknesses Behavioral/Psych: Mood is stable, no new changes  All other systems were reviewed with the patient and are negative.  PHYSICAL EXAMINATION: ECOG PERFORMANCE STATUS:1  Vitals:   07/21/18 1053  BP: (!) 157/65  Pulse: 66  Resp: 18  Temp: 98.5 F (36.9 C)  SpO2: 99%   Filed Weights   07/21/18 1053  Weight: 138 lb 14.4 oz (63 kg)    GENERAL:alert, no distress and comfortable SKIN: No skin rash or lesions EYES: normal, conjunctiva are pink and non-injected, sclera clear OROPHARYNX:no exudate, no erythema and lips, buccal mucosa, and tongue normal  NECK: supple,  thyroid normal size, non-tender,  without nodularity LYMPH:  no palpable lymphadenopathy in the cervical, axillary or inguinal LUNGS: clear to auscultation and percussion with normal breathing effort HEART: regular rate & rhythm and no murmurs and no lower extremity edema ABDOMEN:abdomen soft, non-tender and normal bowel sounds Musculoskeletal:no cyanosis of digits and no clubbing  EXT: 1+ edema of left lower extremity up to the knee, with mild calf tenderness,  trace edema in the right lower extremity below knee, no calf tenderness.Bilateral Lower Extremity swelling resolved PSYCH: alert & oriented x 3 with fluent speech NEURO: no focal motor/sensory deficits BREAST: S/p left mastectomy: Surgical incision healed well. Mild left Arm lymphedema. No palpable mass or further adenopathy in either breast or axilla.    LABORATORY DATA:  I have reviewed the data as listed CBC Latest Ref Rng & Units 07/21/2018 05/11/2018 03/24/2018  WBC 3.9 - 10.3 K/uL 4.1 - 3.6(L)  Hemoglobin 11.6 - 15.9 g/dL 14.1 13.6 13.2  Hematocrit 34.8 - 46.6 % 41.6 40.0 38.8  Platelets 145 - 400 K/uL 228 - 233    CMP Latest Ref Rng & Units 07/21/2018 05/11/2018 03/24/2018  Glucose 70 - 99 mg/dL 108(H) 97 91  BUN 8 - 23 mg/dL _0 Creatinine 0.44 - 1.00 mg/dL 0.62 0.50 0.60  Sodium 135 - 145 mmol/L 139 138 138  Potassium 3.5 - 5.1 mmol/L 4.3 4.8 4.3  Chloride 98 - 111 mmol/L 101 98 103  CO2 22 - 32 mmol/L 28 - 26  Calcium 8.9 - 10.3 mg/dL 9.4 - 9.2  Total Protein 6.5 - 8.1 g/dL 7.1 - 6.8  Total Bilirubin 0.3 - 1.2 mg/dL 0.5 - 0.4  Alkaline Phos 38 - 126 U/L 94 - 88  AST 15 - 41 U/L 20 - 26  ALT 0 - 44 U/L 18 - 23   PATHOLOGY  Diagnosis 07/13/17 1. Breast, simple mastectomy, Left - INVASIVE AND IN SITU DUCTAL CARCINOMA, 4 CM, MSBR GRADE 3. - MARGINS NOT INVOLVED. - SEE ONCOLOGY TABLE. 2. Lymph node, sentinel, biopsy, Left #1 - ONE BENIGN LYMPH NODE (0/1). 3. Lymph node, biopsy, Left #2 - ONE BENIGN LYMPH NODE  (0/1). Microscopic Comment 1. BREAST, INVASIVE TUMOR Procedure: Mastectomy and two sentinel lymph nodes Laterality: Left breast Tumor Size: 4.0 cm Histologic Type: Ductal Grade: 3 Tubular Differentiation: 3 Nuclear Pleomorphism: 2 Mitotic Count: 2 Ductal Carcinoma in Situ (DCIS): Present, intermediate grade Extent of Tumor: Skin: Free of tumor Nipple: Free of tumor Skeletal muscle: Free of tumor Margins: Free of tumor Invasive carcinoma, distance from closest margin: 2 cm from posterior margin DCIS, distance from closest margin: 2 cm from posterior margin Regional Lymph Nodes: Number of Lymph Nodes Examined: 2 Number of Sentinel Lymph Nodes Examined: 2 Lymph Nodes with Macrometastases: 0 Lymph Nodes with Micrometastases: 0 1 of 3 FINAL for Kathleen Hanson, Kathleen Hanson ([id number]) Microscopic Comment(continued) Lymph Nodes with Isolated Tumor Cells: 0 Breast Prognostic Profile: Case number [id number] Estrogen Receptor: 100%, positive, strong staining Progesterone Receptor: 20%, positive, strong staining Her2: Equivocal by FISH, ratio 1.49 and average copy number 4.2. Positive by immunohistochemistry (3+) Ki-67: 25% Best tumor block for sendout testing: 1C Pathologic Stage Classification (pTNM, AJCC 8th Edition): Primary Tumor (pT): pT2 Regional Lymph Nodes (pN): pN0 Distant Metastases (pM): pMX  Diagnosis 06/28/17 Breast, left, needle core biopsy, upper outer quadrant, 2:30 o'clock, 4cm from nipple - INVASIVE MAMMARY CARCINOMA WITH CALCIFICATIONS, SEE COMMENT. Microscopic Comment The carcinoma appears grade 3. E-cadherin will be ordered and reported in an addendum. Prognostic markers will be  ordered. Dr. Melina Copa has reviewed the case. The case was called to [facility] on 06/29/2017. FLUORESCENCE IN-SITU HYBRIDIZATION Results: HER2 - *EQUIVOCAL* (Her2 by IHC ordered) RATIO OF HER2/CEP17 SIGNALS 1.49 AVERAGE HER2 COPY NUMBER PER CELL 4.20 Reference  Range: NEGATIVE HER2/CEP17 Ratio <2.0 and average HER2 copy number <4.0 EQUIVOCAL HER2/CEP17 Ratio <2.0 and average HER2 copy number >=4.0 and <6.0 POSITIVE HER2/CEP17 Ratio >=2.0 or <2.0 and average HER2 copy number >=6.0 PROGNOSTIC INDICATORS Results: IMMUNOHISTOCHEMICAL AND MORPHOMETRIC ANALYSIS PERFORMED MANUALLY Estrogen Receptor: 100%, POSITIVE, STRONG STAINING INTENSITY Progesterone Receptor: 20%, POSITIVE, STRONG STAINING INTENSITY Proliferation Marker Ki67: 25% 1 of 3 FINAL for Kathleen Hanson, Kathleen Hanson [id number]) ADDITIONAL INFORMATION:(continued) REFERENCE RANGE ESTROGEN RECEPTOR NEGATIVE 0% POSITIVE =>1% REFERENCE RANGE PROGESTERONE RECEPTOR NEGATIVE 0% POSITIVE =>1% All controls stained appropriately    RADIOGRAPHIC STUDIES: I have personally reviewed the radiological images as listed and agreed with the findings in the report.   Diagnostic mammogram of right breast 06/21/18 IMPRESSION: 1. No evidence of right breast malignancy. 2. Resolved left axillary seroma. 3. Decreased size of the fluid collection measuring 4.6x0.5x3.4 cm, below the left mastectomy scar.  DG Bone Density 10/11/17 ASSESSMENT: The BMD measured at AP Spine L1-L3 is 1.072 g/cm2 with a T-score of -0.9.   NM Whole Body Scan 08/15/17 FINDINGS: Uptake at the LEFT lateral aspects of the cervical and lower thoracic spine, typically degenerative. Uptake at the shoulders, elbows, wrists, hips, and knees, typically degenerative. Asymmetric uptake at the sternoclavicular joints RIGHT greater than LEFT which may also be degenerative. No definite foci of abnormal osseous tracer accumulation are identified which are suspicious for osseous metastases. Biconvex thoracolumbar scoliosis. Expected urinary tract and soft tissue distribution of tracer. IMPRESSION: Scattered likely degenerative type uptake as above. No definite scintigraphic evidence of osseous metastatic disease.  CT A/P10/22/18 IMPRESSION: 1. No  CT findings to suggest metastatic disease involving the abdomen/pelvis. 2. Left chest wall fluid collection as discussed above. 3. Small right pleural effusion with minimal overlying atelectasis. 4. Cholelithiasis. 5. Small periumbilical abdominal wall hernia containing fat.   ASSESSMENT & PLAN:  Kathleen Hanson is a 82 y.o. caucasian female with a history of breast cancer, HLD, presented with screening discovered left breast cancer.  1. Malignant neoplasm of upper-outer quadrant of left breast in female, invasive ductal carcinoma, pT2N0M0, Stage: 1A, triple positive, Grade 3 -She underwent mastectomy on 07/13/17, I previously reviewed her surgical pathology results with patient in details. -Her primary tumor was 4 cm, grade 3, triple positive, surgical margins were negative, 2 sentinel lymph nodes were negative. -We previously discussed her invasive ductal tumor is early stage, but the Biology is more aggressive, due to HER2 positive, grade 3. She has moderate to high risk of recurrence after surgical resection. -She completed adjuvant TCH with onpro 08/26/17 and due to her multiple side effects from chemo, her advanced age, I stopped her chemo after cycle 4 on 11/04/17. -She proceeded with standard recommended maintenance Herceptin on 12/07/17. Patient is reluctant to complete 1 year of therapy. After several discussion, we decided to stop after cycle 10 on 03/24/18 -We previously discussed she is eligible for oral her2 antibody Neratinib. I provided her with reading material on this drug for her to consider. She declined this therapy at that time, due to concerns of side effects.  -Given she had radiation for her prior breast cancer she will forego adjuvant radiation now. -She started Anastrozole on 03/13/18. I discussed she would continue for a minimal of 5 years  and if she does well she can do up to 7 years.  -She had PAC removal by Dr. Lynelle Doctor on 05/11/18 -She attended Survivorship Clinic with  NP Jamelle Haring on 05/18/18 -She is clinically doing well. Lab reviewed, her Lymphocytes at 0.8 otherwise CBC is WNL. CMP and VitD is still pending. Her physical exam and her 05/2018 mammogram were unremarkable. There is no clinical concern for recurrence. -Continue with Anastrozole daily  -I encouraged her to get a flu shot from her PCP this year. She is agreeable. -F/u in 4 months    2. Bilateral lower extremity edema, L>R, cellulitis in 11/2017 -She received a total of 3 course of antibiotics for her lower extremity cellulitis, is resolved now -Doppler of lower extremities were negative for DVT -She saw Dr. Haroldine Laws on 02/08/18 and he does not think her swelling is related to HF or Herceptin. Her Echo was normal and he ordered ABIS to evaluate arterial flow.  -Her swelling is overall resolved now. She can now reduce or stop her lasix and oral potassium.She will continue to use compression sleeve for her left arm lymphedema.    3. History of invasive and in situ ductal carcinoma of left breast, 10/26/1999, Estrogen receptor negative, stage 1, Grade 3 -Treated in [place]  -s/p Lumpectomy (sentinel node negative, 2-3 removed), chemo, radiation  4. Osteopenia - 03/18/2011, DEXA T = -1.9 -03/27/2014, DEXA  T = -1.4 t -10/11/2017, DEXA T -0.9 -improved overall -I previously discussed that an aromatase inhibitor can weaken her bone and we will monitor this  -I encouraged her to take calcium and vitamin D supplement   PLAN: -Lab and F/u in 4 months  -Continue Anastrozole    No orders of the defined types were placed in this encounter.  All questions were answered. The patient knows to call the clinic with any problems, questions or concerns. I spent 15 minutes counseling the patient face to face. The total time spent in the appointment was 20 minutes and more than 50% was on counseling.  Oneal Deputy, am acting as scribe for [name], MD.   I have reviewed the above  documentation for accuracy and completeness, and I agree with the above.       [name], MD 07/21/18 12:24 PM

## 2018-07-21 ENCOUNTER — Encounter: Payer: Self-pay | Admitting: Hematology

## 2018-07-21 ENCOUNTER — Inpatient Hospital Stay: Payer: Medicare HMO | Attending: Hematology | Admitting: Hematology

## 2018-07-21 ENCOUNTER — Telehealth: Payer: Self-pay

## 2018-07-21 ENCOUNTER — Inpatient Hospital Stay: Payer: Medicare HMO

## 2018-07-21 VITALS — BP 157/65 | HR 66 | Temp 98.5°F | Resp 18 | Ht 64.0 in | Wt 138.9 lb

## 2018-07-21 DIAGNOSIS — Z79811 Long term (current) use of aromatase inhibitors: Secondary | ICD-10-CM

## 2018-07-21 DIAGNOSIS — Z9012 Acquired absence of left breast and nipple: Secondary | ICD-10-CM

## 2018-07-21 DIAGNOSIS — Z1321 Encounter for screening for nutritional disorder: Secondary | ICD-10-CM

## 2018-07-21 DIAGNOSIS — Z87891 Personal history of nicotine dependence: Secondary | ICD-10-CM | POA: Insufficient documentation

## 2018-07-21 DIAGNOSIS — Z170 Estrogen receptor positive status [ER+]: Secondary | ICD-10-CM | POA: Diagnosis not present

## 2018-07-21 DIAGNOSIS — C50412 Malignant neoplasm of upper-outer quadrant of left female breast: Secondary | ICD-10-CM | POA: Insufficient documentation

## 2018-07-21 LAB — CBC WITH DIFFERENTIAL/PLATELET
Basophils Absolute: 0 10*3/uL (ref 0.0–0.1)
Basophils Relative: 0 %
Eosinophils Absolute: 0.1 10*3/uL (ref 0.0–0.5)
Eosinophils Relative: 3 %
HCT: 41.6 % (ref 34.8–46.6)
Hemoglobin: 14.1 g/dL (ref 11.6–15.9)
Lymphocytes Relative: 20 %
Lymphs Abs: 0.8 10*3/uL — ABNORMAL LOW (ref 0.9–3.3)
MCH: 31.3 pg (ref 25.1–34.0)
MCHC: 34 g/dL (ref 31.5–36.0)
MCV: 92.2 fL (ref 79.5–101.0)
Monocytes Absolute: 0.3 10*3/uL (ref 0.1–0.9)
Monocytes Relative: 6 %
Neutro Abs: 2.9 10*3/uL (ref 1.5–6.5)
Neutrophils Relative %: 71 %
Platelets: 228 10*3/uL (ref 145–400)
RBC: 4.51 MIL/uL (ref 3.70–5.45)
RDW: 13.5 % (ref 11.2–14.5)
WBC: 4.1 10*3/uL (ref 3.9–10.3)

## 2018-07-21 LAB — COMPREHENSIVE METABOLIC PANEL
ALK PHOS: 94 U/L (ref 38–126)
ALT: 18 U/L (ref 0–44)
AST: 20 U/L (ref 15–41)
Albumin: 4.1 g/dL (ref 3.5–5.0)
Anion gap: 10 (ref 5–15)
BILIRUBIN TOTAL: 0.5 mg/dL (ref 0.3–1.2)
BUN: 12 mg/dL (ref 8–23)
CALCIUM: 9.4 mg/dL (ref 8.9–10.3)
CO2: 28 mmol/L (ref 22–32)
CREATININE: 0.62 mg/dL (ref 0.44–1.00)
Chloride: 101 mmol/L (ref 98–111)
GFR calc Af Amer: >60 mL/min (ref >60)
Glucose, Bld: 108 mg/dL — ABNORMAL HIGH (ref 70–99)
Potassium: 4.3 mmol/L (ref 3.5–5.1)
Sodium: 139 mmol/L (ref 135–145)
TOTAL PROTEIN: 7.1 g/dL (ref 6.5–8.1)

## 2018-07-21 NOTE — Telephone Encounter (Signed)
Printed avs and calender of upcoming appointment. Per 9/27 los

## 2018-07-22 LAB — VITAMIN D 25 HYDROXY (VIT D DEFICIENCY, FRACTURES): VIT D 25 HYDROXY: 35.5 ng/mL (ref 30.0–100.0)

## 2018-07-24 ENCOUNTER — Telehealth: Payer: Self-pay

## 2018-07-24 NOTE — Telephone Encounter (Signed)
Spoke with patient per Dr. Burr Medico regarding lab results, no concerns, patient verbalized an understanding.

## 2018-07-24 NOTE — Telephone Encounter (Signed)
-----   Message from [name], MD sent at 07/22/2018  6:13 PM EDT ----- Please let pt know the lab results, no concerns, thanks   [name]  07/22/2018

## 2018-07-28 ENCOUNTER — Telehealth: Payer: Self-pay

## 2018-07-28 NOTE — Telephone Encounter (Signed)
Patient calls regarding right arm muscle pain, has only marginally improved, she has not followed up with PT but will next week. Continue Ibuprofen and intermittent heat.  She verbalized an understanding.

## 2018-08-10 ENCOUNTER — Ambulatory Visit: Payer: Medicare HMO | Attending: Family Medicine | Admitting: Physical Therapy

## 2018-08-10 ENCOUNTER — Encounter: Payer: Self-pay | Admitting: Physical Therapy

## 2018-08-10 DIAGNOSIS — M6281 Muscle weakness (generalized): Secondary | ICD-10-CM | POA: Insufficient documentation

## 2018-08-10 DIAGNOSIS — R293 Abnormal posture: Secondary | ICD-10-CM | POA: Diagnosis present

## 2018-08-10 DIAGNOSIS — M25511 Pain in right shoulder: Secondary | ICD-10-CM | POA: Diagnosis present

## 2018-08-10 DIAGNOSIS — M25611 Stiffness of right shoulder, not elsewhere classified: Secondary | ICD-10-CM | POA: Diagnosis present

## 2018-08-10 DIAGNOSIS — I972 Postmastectomy lymphedema syndrome: Secondary | ICD-10-CM | POA: Diagnosis present

## 2018-08-10 NOTE — Therapy (Signed)
[place], [place], [address] Phone: [phone]   Fax:  [phone]  Physical Therapy Evaluation  Patient Details  Name: Kathleen Hanson MRN: [id number] Date of Birth: [birth year] Referring Provider (PT): Dr. Bernerd Limbo    Encounter Date: 08/10/2018  PT End of Session - 08/10/18 1716    Visit Number  1    Number of Visits  9    Date for PT Re-Evaluation  09/10/18    PT Start Time  1105    PT Stop Time  1150    PT Time Calculation (min)  45 min    Activity Tolerance  Patient tolerated treatment well    Behavior During Therapy  [facility] for tasks assessed/performed       Past Medical History:  Diagnosis Date  . Cancer of left breast ([place])   . Constipation   . Family history of breast cancer   . Fibrocystic breast   . Hyperlipidemia   . Ocular migraine    "used to have them monthly for a period of time; seemed to stop; now have had a couple in the last week" (07/13/2017)    Past Surgical History:  Procedure Laterality Date  . BREAST BIOPSY Right ~ 2001  . BREAST EXCISIONAL BIOPSY Right   . BREAST LUMPECTOMY Left 2001   chemo and radiation  . CESAREAN SECTION  X 1  . IR CATHETER TUBE CHANGE  07/20/2017  . MASTECTOMY COMPLETE / SIMPLE W/ SENTINEL NODE BIOPSY Left 07/13/2017    LEFT MASTECTOMY WITH SENTINEL NODE MAPPING ERAS PATHWAY Kathleen Hanson 07/13/2017  . MASTECTOMY W/ SENTINEL NODE BIOPSY Left 07/13/2017   Procedure: LEFT MASTECTOMY WITH SENTINEL NODE MAPPING ERAS PATHWAY;  Surgeon: [name], MD;  Location: [place];  Service: General;  Laterality: Left;  . PORT-A-CATH REMOVAL N/A 05/11/2018   Procedure: REMOVAL PORT-A-CATH;  Surgeon: Stark Klein, MD;  Location: [place];  Service: General;  Laterality: N/A;  . PORTACATH PLACEMENT Right 07/13/2017  . PORTACATH PLACEMENT Right 07/13/2017   Procedure: INSERTION PORT-A-CATH;  Surgeon: [name], MD;  Location: [place];   Service: General;  Laterality: Right;  . TONSILLECTOMY      There were no vitals filed for this visit.   Subjective Assessment - 08/10/18 1116    Subjective  Pt comes to PT this time as she has developed pain in her right shoulder that she thinks may be related to putting on her sleeve and trying to do self MLD  She has difficulty sleeping and putting on her shirts and sweaters She has not worn her compression sleeve in over a week as it hurt her right arm to apply it  She says the xrays taken at the doctor's office showed no arthritis or soft tissue problem    Pertinent History  Lt lumpectomy 2001 with chemo and radiation and then mastectomy 2018 with SLNB 0/2 with chemotherapy again.  Now just on anastrozole.  high cholesterol, developed acute shoulder pain     Patient Stated Goals  to get rid of her right shoulder pain     Currently in Pain?  Yes    Pain Score  2    ranges from 1-10    Pain Location  Shoulder    Pain Orientation  Right    Aggravating Factors   worse when she tries to lift her arm     Pain Relieving Factors  medicine helps a little  Effect of Pain on Daily Activities  has to lift her right arm with her left arm          [facility] PT Assessment - 08/10/18 0001      Assessment   Medical Diagnosis  Lt mastectomy/UE lymphedema right shoulder pain     Referring Provider (PT)  Dr. Bernerd Limbo     Onset Date/Surgical Date  02/22/18    Hand Dominance  Right    Next MD Visit  unknown    Prior Therapy  no      Precautions   Precaution Comments  cancer, lymphedema      Restrictions   Weight Bearing Restrictions  No      Balance Screen   Has the patient fallen in the past 6 months  Yes    How many times?  1   related to glasses , tripped on refrigerator door    Has the patient had a decrease in activity level because of a fear of falling?   No    Is the patient reluctant to leave their home because of a fear of falling?   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  She has mild winging of right scapula     Skin Integrity  no open areas     Quick DASH   43.18      Sensation   Light Touch  Appears Intact      Coordination   Gross Motor Movements are Fluid and Coordinated  No   very limited right shoulder movment due to pain      Posture/Postural Control   Posture/Postural Control  Postural limitations    Postural Limitations  Rounded Shoulders;Forward head      ROM / Strength   AROM / PROM / Strength  AROM;PROM;Strength      AROM   Right Shoulder Flexion  65 Degrees    Right Shoulder ABduction  65 Degrees    Right Shoulder Internal Rotation  75 Degrees    Right Shoulder External Rotation  60 Degrees      PROM   Right Shoulder Flexion  155 Degrees   measured in supine   Right Shoulder ABduction  104 Degrees   measured in supine     Strength   Overall Strength Comments  pain with resisted elbow flexion and pain with active movment of right shoulder .  unable to tolerate isometric testing of right shoulder       Palpation   Palpation comment  no pain/ or only mild  to firm palpation of left shoulder subacromial bursa and biceps tendon       Special Tests   Rotator Cuff Impingment tests  Neer impingement test;Hawkins- Kennedy test;Drop Arm test;Painful Arc of Motion      Neer Impingement test    Findings  Positive    Side  Right    Comments  with scapula stablized and arm out in horizontal adduction      Hawkins-Kennedy test   Findings  Negative    Side  Right       Drop Arm test   Findings  Negative    Side  Right      Painful Arc of Motion   Findings  Positive    Side  Right    Comments  pt is able to tolerate passive range of motion, but has pain with active elevatoin especailly at about 110 degrees less pain above 140         LYMPHEDEMA/ONCOLOGY QUESTIONNAIRE - 08/10/18 1141      Left Upper Extremity Lymphedema   15 cm Proximal to Olecranon Process  26.5 cm    10 cm Proximal to Olecranon Process  25.5 cm    Olecranon Process  24.5 cm    15 cm Proximal to Ulnar Styloid Process  22.3 cm    10 cm Proximal to Ulnar Styloid Process  19.6 cm    Just Proximal to Ulnar Styloid Process  14.7 cm    Across Hand at [employer]  17 cm    At [place] of 2nd Digit  5.6 cm          Quick Dash - 08/10/18 0001    Open a tight or new jar  Moderate difficulty    Do heavy household chores (wash walls, wash floors)  Moderate difficulty    Carry a shopping bag or briefcase  Mild difficulty    Wash your back  Moderate difficulty    Use a knife to cut food  Moderate difficulty    Recreational activities in which you take some force or impact through your arm, shoulder, or hand (golf, hammering, tennis)  Severe difficulty    During the past week, to what extent has your arm, shoulder or hand problem interfered with your normal social activities with family, friends, neighbors, or groups?  Modererately    During the past week, to what extent has your arm, shoulder or hand problem limited your work or other regular daily activities  Modererately    Arm, shoulder, or hand pain.  Moderate    Tingling (pins and needles) in your arm, shoulder, or hand  None    Difficulty Sleeping  Mild difficulty    DASH Score  43.18 %        Outpatient Re[facility]  Lymphedema Life Impact Scale Total Score  10.29 %      Objective measurements completed on examination: See above findings.      [place] Adult PT  Treatment/Exercise - 08/10/18 0001      Self-Care   Self-Care  Other Self-Care Comments    Other Self-Care Comments   showed pt Medi bulter for ease in donning compression sleeve without use of right arm and demonstated how to use it.  Also found it for pt on [employer] website and gave her information about how to access it at home              PT Education - 08/10/18 1716    Education provided  Yes    Education Details  how to obtain and use the Medi butler to apply compression sleeve    Person(s) Educated  Patient    Methods  Explanation;Demonstration    Comprehension  Verbalized understanding          PT Long Term Goals - 08/10/18  [place] #1   Title  Pt will report a 50% reduction in pain in right shoulder throughout the day     Time  4    Period  Weeks    Status  New      PT LONG TERM GOAL #2   Title  Pt wil be independent in a home exercise program for right shoulder     Time  4    Period  Weeks    Status  New      PT LONG TERM GOAL #3   Title  Pt will have 140 degrees of painfree right shoulder abduction     Baseline  65 o 08/10/2018    Time  4    Period  Weeks    Status  New      PT LONG TERM GOAL #4   Title  Pt will decrease quick DASH score to < 20 indicating an improvement in functional use of right arm     Baseline  43.18             Plan - 08/10/18 1717    Clinical Impression Statement  82 yo female recently seen in our clnic for treatment for lymphedema comes back with acute pain in right shoulder with limited movement.  Pain is provoked with active movement and she has postivie impingment sign with the Neer test. Feel she would benefit from PT to decrease pain and increase range of motion and stregth as inflammation decreases.  Her left arm lymphedema is stable even though she has not worn her sleeve in a week. She would benefit from assistive device to help with sleeve donning as the across body struggle to don the  tight sleeve seems to have exacerbated her shoulder pain.     History and Personal Factors relevant to plan of care:  hx of raditaion , SLNB, chemotherapy, probable seroma     Clinical Presentation  Stable    Clinical Presentation due to:  new onset     Clinical Decision Making  Low    Rehab Potential  Good    Clinical Impairments Affecting Rehab Potential  Hx previous breast cancer on same side    PT Frequency  2x / week    PT Duration  4 weeks    PT Treatment/Interventions  ADLs/Self Care Home Management;Iontophoresis 4mg /ml Dexamethasone;Passive range of motion;Electrical Stimulation;DME Instruction;Therapeutic exercise;Therapeutic activities;Patient/family education;Orthotic Fit/Training;Manual lymph drainage;Manual techniques;Taping;Joint Manipulations    PT Next Visit Plan  scapular retraction and thoracic mobility exercise, A/AA/PROM, teach isometrics if pain free, consider taping and ionto for pain relief , manual techiques as indicated     Consulted and Agree with Plan of Care  Patient       Patient will benefit from skilled therapeutic intervention in order to improve the following deficits and impairments:  Pain, Impaired UE functional use, Decreased range of motion, Decreased strength, Increased muscle spasms, Postural dysfunction  Visit Diagnosis: Acute pain of right shoulder - Plan: PT plan of care cert/re-cert  Stiffness of right shoulder joint - Plan: PT plan of care cert/re-cert  Muscle weakness (generalized) - Plan: PT plan of care cert/re-cert     Problem List Patient Active Problem List   Diagnosis Date Noted  . Lower extremity cellulitis 12/07/2017  . Genetic testing 08/22/2017  . Family history of breast cancer   . Pneumothorax on right 07/18/2017  . Malignant neoplasm of upper-outer quadrant of left breast in  female, estrogen receptor positive ([place]) 06/30/2017   Kathleen Hanson PT  Kathleen Hanson 08/10/2018, 5:29 PM  [place], [place], [address] Phone: [phone]   Fax:  [phone]  Name: Kathleen Hanson MRN: [id number] Date of Birth: 09-Oct-1936

## 2018-08-14 ENCOUNTER — Encounter: Payer: Self-pay | Admitting: Physical Therapy

## 2018-08-14 ENCOUNTER — Ambulatory Visit: Payer: Medicare HMO | Admitting: Physical Therapy

## 2018-08-14 ENCOUNTER — Other Ambulatory Visit: Payer: Self-pay

## 2018-08-14 DIAGNOSIS — M25611 Stiffness of right shoulder, not elsewhere classified: Secondary | ICD-10-CM

## 2018-08-14 DIAGNOSIS — M6281 Muscle weakness (generalized): Secondary | ICD-10-CM

## 2018-08-14 DIAGNOSIS — M25511 Pain in right shoulder: Secondary | ICD-10-CM | POA: Diagnosis not present

## 2018-08-14 NOTE — Patient Instructions (Signed)
Access Code: [email or web address]  URL: [email or web address]  Date: 08/14/2018  Prepared by: Manus Gunning   Exercises  Standing Scapular Retraction - 10 reps - 1 sets - 5 hold - 2x daily - 7x weekly  Isometric Shoulder Flexion at Wall - 5 reps - 1 sets - 5 hold - 2x daily - 7x weekly  Isometric Shoulder Extension at Wall - 5 reps - 1 sets - 5 sec hold - 2x daily - 7x weekly  Isometric Shoulder External Rotation at Wall - 5 reps - 1 sets - 5 sec hold - 2x daily - 7x weekly

## 2018-08-14 NOTE — Therapy (Signed)
[place], [place], [address] Phone: [phone]   Fax:  [phone]  Physical Therapy Treatment  Patient Details  Name: Kathleen Hanson MRN: [id number] Date of Birth: February 10, 1936 Referring Provider (PT): Dr. Bernerd Limbo    Encounter Date: 08/14/2018  PT End of Session - 08/14/18 1659    Visit Number  2    Number of Visits  9    Date for PT Re-Evaluation  09/10/18    PT Start Time  1537   pt arrived late   PT Stop Time  1600    PT Time Calculation (min)  23 min    Activity Tolerance  Patient tolerated treatment well    Behavior During Therapy  [facility] for tasks assessed/performed       Past Medical History:  Diagnosis Date  . Cancer of left breast ([place])   . Constipation   . Family history of breast cancer   . Fibrocystic breast   . Hyperlipidemia   . Ocular migraine    "used to have them monthly for a period of time; seemed to stop; now have had a couple in the last week" (07/13/2017)    Past Surgical History:  Procedure Laterality Date  . BREAST BIOPSY Right ~ 2001  . BREAST EXCISIONAL BIOPSY Right   . BREAST LUMPECTOMY Left 2001   chemo and radiation  . CESAREAN SECTION  X 1  . IR CATHETER TUBE CHANGE  07/20/2017  . MASTECTOMY COMPLETE / SIMPLE W/ SENTINEL NODE BIOPSY Left 07/13/2017    LEFT MASTECTOMY WITH SENTINEL NODE MAPPING ERAS PATHWAY [name] 07/13/2017  . MASTECTOMY W/ SENTINEL NODE BIOPSY Left 07/13/2017   Procedure: LEFT MASTECTOMY WITH SENTINEL NODE MAPPING ERAS PATHWAY;  Surgeon: [name], MD;  Location: [facility];  Service: General;  Laterality: Left;  . PORT-A-CATH REMOVAL N/A 05/11/2018   Procedure: REMOVAL PORT-A-CATH;  Surgeon: Stark Klein, MD;  Location: [place];  Service: General;  Laterality: N/A;  . PORTACATH PLACEMENT Right 07/13/2017  . PORTACATH PLACEMENT Right 07/13/2017   Procedure: INSERTION PORT-A-CATH;  Surgeon: [name], MD;   Location: [place];  Service: General;  Laterality: Right;  . TONSILLECTOMY      There were no vitals filed for this visit.  Subjective Assessment - 08/14/18 1537    Subjective  I think my shoulder is feeling a little bit better. I still have trouble raising it. I can not move my arm behind my back.     Pertinent History  Lt lumpectomy 2001 with chemo and radiation and then mastectomy 2018 with SLNB 0/2 with chemotherapy again.  Now just on anastrozole.  high cholesterol, developed acute shoulder pain     Patient Stated Goals  to get rid of her right shoulder pain     Currently in Pain?  No/denies    Pain Score  0-No pain                  Outpatient Rehab from 05/15/2018 in [place]  Lymphedema Life Impact Scale Total Score  10.29 %           [facility] Adult PT Treatment/Exercise - 08/14/18 0001      Exercises   Exercises  Shoulder      Shoulder Exercises: Standing   Retraction  Strengthening;Both;10 reps   with 5 sec holds with verbal cues to avoid shoulder hike     Shoulder Exercises: Isometric Strengthening   Flexion  5X5";Other (comment)   with comments to gently press as not to cause pain   Extension  5X5"    External Rotation  5X5"                  PT Long Term Goals - 08/10/18 1725      PT LONG TERM GOAL #1   Title  Pt will report a 50% reduction in pain in right shoulder throughout the day     Time  4    Period  Weeks    Status  New      PT LONG TERM GOAL #2   Title  Pt wil be independent in a home exercise program for right shoulder     Time  4    Period  Weeks    Status  New      PT LONG TERM GOAL #3   Title  Pt will have 140 degrees of painfree right shoulder abduction     Baseline  65 o 08/10/2018    Time  4    Period  Weeks    Status  New      PT LONG TERM GOAL #4   Title  Pt will decrease quick DASH score to < 20 indicating an improvement in functional use of right arm     Baseline  43.18             Plan - 08/14/18 1700    Clinical Impression Statement  Pt arrived late to session today. Instructed pt in isometric exercises and she only had slight pain with flexion. Educatd pt not to press as hard and then she did not have pain with this. Gave pt HEP including shoulder isometrics and shoulder retraction. Right scapula wings more than left and needs strengthening. Will apply ionto once cert is signed by doctor.     Rehab Potential  Good    Clinical Impairments Affecting Rehab Potential  Hx previous breast cancer on same side    PT Frequency  2x / week    PT Duration  4 weeks    PT Treatment/Interventions  ADLs/Self Care Home Management;Iontophoresis 4mg /ml Dexamethasone;Passive range of motion;Electrical Stimulation;DME Instruction;Therapeutic exercise;Therapeutic activities;Patient/family education;Orthotic Fit/Training;Manual lymph drainage;Manual techniques;Taping;Joint Manipulations    PT Next Visit Plan  see if isometrics helped, do ionto if cert is signed, scapular retraction and thoracic mobility exercise, A/AA/PROM, isometrics if pain free, consider taping and ionto for pain relief , manual techiques as indicated     Consulted and Agree with Plan of Care  Patient       Patient will benefit from skilled therapeutic intervention in order to improve the following deficits and impairments:  Pain, Impaired UE functional use, Decreased range of motion, Decreased strength, Increased muscle spasms, Postural dysfunction  Visit Diagnosis: Acute pain of right shoulder  Stiffness of right shoulder joint  Muscle weakness (generalized)     Problem List Patient Active Problem List   Diagnosis Date Noted  . Lower extremity cellulitis 12/07/2017  . Genetic testing 08/22/2017  . Family history of breast cancer   . Pneumothorax on right 07/18/2017  . Malignant neoplasm of upper-outer quadrant of left breast in female, estrogen receptor positive ([place]) 06/30/2017    Allyson Sabal [facility] 08/14/2018, [place], [place], [address] Phone: [phone]   Fax:  [phone]  Name: Kathleen Hanson MRN: [id number] Date of Birth: 09-Aug-1936  Manus Gunning, PT 08/14/18 5:03  PM

## 2018-08-17 ENCOUNTER — Ambulatory Visit: Payer: Medicare HMO

## 2018-08-17 DIAGNOSIS — M25511 Pain in right shoulder: Secondary | ICD-10-CM | POA: Diagnosis not present

## 2018-08-17 DIAGNOSIS — M6281 Muscle weakness (generalized): Secondary | ICD-10-CM

## 2018-08-17 DIAGNOSIS — M25611 Stiffness of right shoulder, not elsewhere classified: Secondary | ICD-10-CM

## 2018-08-17 NOTE — Therapy (Signed)
[place], [place], [address] Phone: [phone]   Fax:  [phone]  Physical Therapy Treatment  Patient Details  Name: Kathleen Hanson MRN: [id number] Date of Birth: 1936/08/23 Referring Provider (PT): Dr. Bernerd Limbo    Encounter Date: 08/17/2018  PT End of Session - 08/17/18 1213    Visit Number  3    Number of Visits  9    Date for PT Re-Evaluation  09/10/18    PT Start Time  1108    PT Stop Time  1204    PT Time Calculation (min)  56 min    Activity Tolerance  Patient tolerated treatment well    Behavior During Therapy  [facility] for tasks assessed/performed       Past Medical History:  Diagnosis Date  . Cancer of left breast ([place])   . Constipation   . Family history of breast cancer   . Fibrocystic breast   . Hyperlipidemia   . Ocular migraine    "used to have them monthly for a period of time; seemed to stop; now have had a couple in the last week" (07/13/2017)    Past Surgical History:  Procedure Laterality Date  . BREAST BIOPSY Right ~ 2001  . BREAST EXCISIONAL BIOPSY Right   . BREAST LUMPECTOMY Left 2001   chemo and radiation  . CESAREAN SECTION  X 1  . IR CATHETER TUBE CHANGE  07/20/2017  . MASTECTOMY COMPLETE / SIMPLE W/ SENTINEL NODE BIOPSY Left 07/13/2017    LEFT MASTECTOMY WITH SENTINEL NODE MAPPING ERAS PATHWAY [name] 07/13/2017  . MASTECTOMY W/ SENTINEL NODE BIOPSY Left 07/13/2017   Procedure: LEFT MASTECTOMY WITH SENTINEL NODE MAPPING ERAS PATHWAY;  Surgeon: [name], MD;  Location: [place];  Service: General;  Laterality: Left;  . PORT-A-CATH REMOVAL N/A 05/11/2018   Procedure: REMOVAL PORT-A-CATH;  Surgeon: Stark Klein, MD;  Location: [place];  Service: General;  Laterality: N/A;  . PORTACATH PLACEMENT Right 07/13/2017  . PORTACATH PLACEMENT Right 07/13/2017   Procedure: INSERTION PORT-A-CATH;  Surgeon: [name], MD;  Location: [place];   Service: General;  Laterality: Right;  . TONSILLECTOMY      There were no vitals filed for this visit.  Subjective Assessment - 08/17/18 1120    Subjective  I tried the exercises she gave me last time and they went okay except the flexion still hurts. I tried taking Aleve a few times but it hasn't seemed to help.     Pertinent History  Lt lumpectomy 2001 with chemo and radiation and then mastectomy 2018 with SLNB 0/2 with chemotherapy again.  Now just on anastrozole.  high cholesterol, developed acute shoulder pain     Patient Stated Goals  to get rid of her right shoulder pain     Currently in Pain?  Yes    Pain Score  2     Pain Location  Shoulder    Pain Orientation  Right    Pain Descriptors / Indicators  Aching    Pain Onset  More than a month ago    Pain Frequency  Intermittent    Aggravating Factors   worse with lifting her arm    Pain Relieving Factors  meds help a little                  Outpatient [facility]  Lymphedema Life Impact Scale Total Score  10.29 %  [facility] Adult PT Treatment/Exercise - 08/17/18 0001      Self-Care   Other Self-Care Comments   Answered pts questions in length about her current Rt shoulder dysfunction and how strengthening her Rt shoulder in a painfree ROM will help improve her Rt shoulder in general.       Shoulder Exercises: Sidelying   External Rotation  AROM;Strengthening;Right;10 reps;Theraband   first set no band, then tried band but painful so stopped   Theraband Level (Shoulder External Rotation)  Level 1 (Yellow)      Shoulder Exercises: Standing   Row  Strengthening;Both;10 reps;Theraband   low row with elbows extended   Theraband Level (Shoulder Row)  Level 1 (Yellow)    Retraction  Strengthening;Both;10 reps;Theraband    Theraband Level (Shoulder Retraction)  Level 1 (Yellow)      Shoulder Exercises: Isometric Strengthening   Flexion   5X5";Limitations   Pt did not have pain with this today   Flexion Limitations  VCs to not lean into forward doorway.     Extension  5X5"    External Rotation  5X5";Limitations   All above done today in doorway with Rt UE   External Rotation Limitations  Demonstration and VCs for correct UE position                  PT Long Term Goals - 08/10/18 1725      PT LONG TERM GOAL #1   Title  Pt will report a 50% reduction in pain in right shoulder throughout the day     Time  4    Period  Weeks    Status  New      PT LONG TERM GOAL #2   Title  Pt wil be independent in a home exercise program for right shoulder     Time  4    Period  Weeks    Status  New      PT LONG TERM GOAL #3   Title  Pt will have 140 degrees of painfree right shoulder abduction     Baseline  65 o 08/10/2018    Time  4    Period  Weeks    Status  New      PT LONG TERM GOAL #4   Title  Pt will decrease quick DASH score to < 20 indicating an improvement in functional use of right arm     Baseline  43.18            Plan - 08/17/18 1213    Clinical Impression Statement  Evaluation not back signed from doctor yet so unable to begin iontophoresis today. Reviewed isometrics of Rt shoulder which pt reported no pain with today (had pain with flexion last). Also progessed strength to include yellow theraband resisitance for low rows and scapular retraction which she tolerated without pain though did require taactile cues for correct technique.     Rehab Potential  Good    Clinical Impairments Affecting Rehab Potential  Hx previous breast cancer on same side    PT Frequency  2x / week    PT Duration  4 weeks    PT Treatment/Interventions  ADLs/Self Care Home Management;Iontophoresis 4mg /ml Dexamethasone;Passive range of motion;Electrical Stimulation;DME Instruction;Therapeutic exercise;Therapeutic activities;Patient/family education;Orthotic Fit/Training;Manual lymph drainage;Manual techniques;Taping;Joint  Manipulations    PT Next Visit Plan  Cont isometrics and progress to [place] when able, do ionto if cert is signed, cont scapular retraction and thoracic mobility exercise, A/AA/PROM, consider taping , manual techiques  as indicated     Consulted and Agree with Plan of Care  Patient       Patient will benefit from skilled therapeutic intervention in order to improve the following deficits and impairments:  Pain, Impaired UE functional use, Decreased range of motion, Decreased strength, Increased muscle spasms, Postural dysfunction  Visit Diagnosis: Acute pain of right shoulder  Stiffness of right shoulder joint  Muscle weakness (generalized)     Problem List Patient Active Problem List   Diagnosis Date Noted  . Lower extremity cellulitis 12/07/2017  . Genetic testing 08/22/2017  . Family history of breast cancer   . Pneumothorax on right 07/18/2017  . Malignant neoplasm of upper-outer quadrant of left breast in female, estrogen receptor positive ([place]) 06/30/2017    Otelia Limes, PTA 08/17/2018, 12:23 PM  [place], [place], [address] Phone: [phone]   Fax:  [phone]  Name: ANDRES ESCANDON MRN: [id number] Date of Birth: 1936-04-06

## 2018-08-22 ENCOUNTER — Encounter: Payer: Self-pay | Admitting: Physical Therapy

## 2018-08-22 ENCOUNTER — Ambulatory Visit: Payer: Medicare HMO | Admitting: Physical Therapy

## 2018-08-22 DIAGNOSIS — M25511 Pain in right shoulder: Secondary | ICD-10-CM

## 2018-08-22 DIAGNOSIS — I972 Postmastectomy lymphedema syndrome: Secondary | ICD-10-CM

## 2018-08-22 DIAGNOSIS — M6281 Muscle weakness (generalized): Secondary | ICD-10-CM

## 2018-08-22 DIAGNOSIS — R293 Abnormal posture: Secondary | ICD-10-CM

## 2018-08-22 DIAGNOSIS — M25611 Stiffness of right shoulder, not elsewhere classified: Secondary | ICD-10-CM

## 2018-08-22 NOTE — Therapy (Signed)
[place], [place], [address] Phone: [phone]   Fax:  [phone]  Physical Therapy Treatment  Patient Details  Name: Kathleen Hanson MRN: [id number] Date of Birth: Oct 24, 1936 Referring Provider (PT): Dr. Bernerd Limbo    Encounter Date: 08/22/2018  PT End of Session - 08/22/18 1403    Visit Number  4    Number of Visits  9    Date for PT Re-Evaluation  09/10/18    PT Start Time  6283   pt arrived late   PT Stop Time  1434    PT Time Calculation (min)  31 min    Activity Tolerance  Patient tolerated treatment well    Behavior During Therapy  [facility] for tasks assessed/performed       Past Medical History:  Diagnosis Date  . Cancer of left breast ([place])   . Constipation   . Family history of breast cancer   . Fibrocystic breast   . Hyperlipidemia   . Ocular migraine    "used to have them monthly for a period of time; seemed to stop; now have had a couple in the last week" (07/13/2017)    Past Surgical History:  Procedure Laterality Date  . BREAST BIOPSY Right ~ 2001  . BREAST EXCISIONAL BIOPSY Right   . BREAST LUMPECTOMY Left 2001   chemo and radiation  . CESAREAN SECTION  X 1  . IR CATHETER TUBE CHANGE  07/20/2017  . MASTECTOMY COMPLETE / SIMPLE W/ SENTINEL NODE BIOPSY Left 07/13/2017    LEFT MASTECTOMY WITH SENTINEL NODE MAPPING ERAS PATHWAY [name] 07/13/2017  . MASTECTOMY W/ SENTINEL NODE BIOPSY Left 07/13/2017   Procedure: LEFT MASTECTOMY WITH SENTINEL NODE MAPPING ERAS PATHWAY;  Surgeon: [name], MD;  Location: [facility];  Service: General;  Laterality: Left;  . PORT-A-CATH REMOVAL N/A 05/11/2018   Procedure: REMOVAL PORT-A-CATH;  Surgeon: Stark Klein, MD;  Location: [facility];  Service: General;  Laterality: N/A;  . PORTACATH PLACEMENT Right 07/13/2017  . PORTACATH PLACEMENT Right 07/13/2017   Procedure: INSERTION PORT-A-CATH;  Surgeon: [name], MD;   Location: [facility];  Service: General;  Laterality: Right;  . TONSILLECTOMY      There were no vitals filed for this visit.  Subjective Assessment - 08/22/18 1404    Subjective  pt says she got a different type of device to try to help put her sleeve on that was less expensive than the Med[employer] butler as finances are a concern for her    Pertinent History  Lt lumpectomy 2001 with chemo and radiation and then mastectomy 2018 with SLNB 0/2 with chemotherapy again.  Now just on anastrozole.  high cholesterol, developed acute shoulder pain     Patient Stated Goals  to get rid of her right shoulder pain     Currently in Pain?  No/denies            LYMPHEDEMA/ONCOLOGY QUESTIONNAIRE - 08/22/18 1410      Left Upper Extremity Lymphedema   15 cm Proximal to Olecranon Process  26.5 cm    10 cm Proximal to Olecranon Process  26 cm    Olecranon Process  25.5 cm    15 cm Proximal to Ulnar Styloid Process  23.5 cm    10 cm Proximal to Ulnar Styloid Process  20.5 cm    Just Proximal to Ulnar Styloid Process  14.5 cm    Across Hand at [employer]  17 cm    At [facility] of 2nd Digit  5.6 cm           Outpatient Rehab from 05/15/2018 in [place]  Lymphedema Life Impact Scale Total Score  10.29 %           [facility] Adult PT Treatment/Exercise - 08/22/18 0001      Exercises   Exercises  Shoulder      Shoulder Exercises: Seated   Other Seated Exercises  alternating isometrics and holds to right arm after it was placed in various painfree postionis by therapist for a hold       Modalities   Modalities  Iontophoresis      Iontophoresis   Type of Iontophoresis  Dexamethasone    Location  right anterior shoulder    Dose  4mg  in one ml    Time  6 hours.  pt instructed to remove at any sign of irritation       Manual Therapy   Edema Management  Remeasured left arm and she shows up to 1 cm increase in left arm.  Gave pt a signed referral form to take to  [employer] for pt to get a velcro garment for left arm and hand since she is unable to don her sleeve due to right shoulder pain              PT Education - 08/22/18 1847    Education provided  Yes    Education Details  how to get a velcro garment though [employer]     Person(s) Educated  Patient    Methods  Explanation    Comprehension  Verbalized understanding          PT Long Term Goals - 08/10/18 1725      PT LONG TERM GOAL #1   Title  Pt will report a 50% reduction in pain in right shoulder throughout the day     Time  4    Period  Weeks    Status  New      PT LONG TERM GOAL #2   Title  Pt wil be independent in a home exercise program for right shoulder     Time  4    Period  Weeks    Status  New      PT LONG TERM GOAL #3   Title  Pt will have 140 degrees of painfree right shoulder abduction     Baseline  65 o 08/10/2018    Time  4    Period  Weeks    Status  New      PT LONG TERM GOAL #4   Title  Pt will decrease quick DASH score to < 20 indicating an improvement in functional use of right arm     Baseline  43.18            Plan - 08/22/18 1848    Clinical Impression Statement  Pt is having a recurrence of lymphedema in her left arm, especially at forearm and she is unable to don her sleeve due to pain in right shoulder.  Gave her referreal to get a velcro garment paid for by [employer]. Pain continues in right shoudler and into order received so that was appied to  right shoulder today     PT Next Visit Plan  assess effect of ionto: Cont isometrics and progress to [place] when able, do ionto if cert is signed, cont scapular retraction and thoracic mobility exercise,  A/AA/PROM, consider taping , manual techiques as indicated        Patient will benefit from skilled therapeutic intervention in order to improve the following deficits and impairments:  Pain, Impaired UE functional use, Decreased range of motion, Decreased strength, Increased muscle spasms, Postural  dysfunction  Visit Diagnosis: Acute pain of right shoulder  Stiffness of right shoulder joint  Muscle weakness (generalized)  Postmastectomy lymphedema  Abnormal posture     Problem List Patient Active Problem List   Diagnosis Date Noted  . Lower extremity cellulitis 12/07/2017  . Genetic testing 08/22/2017  . Family history of breast cancer   . Pneumothorax on right 07/18/2017  . Malignant neoplasm of upper-outer quadrant of left breast in female, estrogen receptor positive ([place]) 06/30/2017   Donato Heinz. Owens Shark PT  Norwood Levo 08/22/2018, 6:51 PM  [place], [place], [address] Phone: [phone]   Fax:  [phone]  Name: Kathleen Hanson MRN: [id number] Date of Birth: 1936/09/10 Addendum: pt called later to say she already got her velcro garment and will wear it tonight if she can tolerate it

## 2018-08-22 NOTE — Patient Instructions (Signed)

## 2018-08-24 ENCOUNTER — Encounter: Payer: Self-pay | Admitting: Physical Therapy

## 2018-08-24 ENCOUNTER — Ambulatory Visit: Payer: Medicare HMO | Admitting: Physical Therapy

## 2018-08-24 DIAGNOSIS — I972 Postmastectomy lymphedema syndrome: Secondary | ICD-10-CM

## 2018-08-24 DIAGNOSIS — M25611 Stiffness of right shoulder, not elsewhere classified: Secondary | ICD-10-CM

## 2018-08-24 DIAGNOSIS — M25511 Pain in right shoulder: Secondary | ICD-10-CM | POA: Diagnosis not present

## 2018-08-24 DIAGNOSIS — R293 Abnormal posture: Secondary | ICD-10-CM

## 2018-08-24 DIAGNOSIS — M6281 Muscle weakness (generalized): Secondary | ICD-10-CM

## 2018-08-24 NOTE — Therapy (Signed)
[place], [place], [address] Phone: [phone]   Fax:  [phone]  Physical Therapy Treatment  Patient Details  Name: Kathleen Hanson MRN: [id number] Date of Birth: 30-Dec-1935 Referring Provider (PT): Dr. Bernerd Limbo    Encounter Date: 08/24/2018  PT End of Session - 08/24/18 2105    Visit Number  5    Number of Visits  9    Date for PT Re-Evaluation  09/10/18    PT Start Time  1430    PT Stop Time  1515    PT Time Calculation (min)  45 min       Past Medical History:  Diagnosis Date  . Cancer of left breast ([place])   . Constipation   . Family history of breast cancer   . Fibrocystic breast   . Hyperlipidemia   . Ocular migraine    "used to have them monthly for a period of time; seemed to stop; now have had a couple in the last week" (07/13/2017)    Past Surgical History:  Procedure Laterality Date  . BREAST BIOPSY Right ~ 2001  . BREAST EXCISIONAL BIOPSY Right   . BREAST LUMPECTOMY Left 2001   chemo and radiation  . CESAREAN SECTION  X 1  . IR CATHETER TUBE CHANGE  07/20/2017  . MASTECTOMY COMPLETE / SIMPLE W/ SENTINEL NODE BIOPSY Left 07/13/2017    LEFT MASTECTOMY WITH SENTINEL NODE MAPPING ERAS PATHWAY [name] 07/13/2017  . MASTECTOMY W/ SENTINEL NODE BIOPSY Left 07/13/2017   Procedure: LEFT MASTECTOMY WITH SENTINEL NODE MAPPING ERAS PATHWAY;  Surgeon: [name], MD;  Location: [facility];  Service: General;  Laterality: Left;  . PORT-A-CATH REMOVAL N/A 05/11/2018   Procedure: REMOVAL PORT-A-CATH;  Surgeon: Stark Klein, MD;  Location: [facility];  Service: General;  Laterality: N/A;  . PORTACATH PLACEMENT Right 07/13/2017  . PORTACATH PLACEMENT Right 07/13/2017   Procedure: INSERTION PORT-A-CATH;  Surgeon: [name], MD;  Location: [facility] [place] [facility];  Service: General;  Laterality: Right;  . TONSILLECTOMY      There were no vitals filed for this visit.  Subjective  Assessment - 08/24/18 1437    Subjective  Pt states she got the velcro garment from [employer]. but she doesn't think she will be able to put it on herself.  She can't say that the the pain patch helped but she thinks her arm is feeling better.  She just put some CBD oil on her arm just before she came.  She does her arm is feeling better.     Pertinent History  Lt lumpectomy 2001 with chemo and radiation and then mastectomy 2018 with SLNB 0/2 with chemotherapy again.  Now just on anastrozole.  high cholesterol, developed acute shoulder pain     Patient Stated Goals  to get rid of her right shoulder pain     Currently in Pain?  No/denies            LYMPHEDEMA/ONCOLOGY QUESTIONNAIRE - 08/24/18 1441      Left Upper Extremity Lymphedema   15 cm Proximal to Olecranon Process  27 cm    10 cm Proximal to Olecranon Process  26.5 cm    Olecranon Process  26 cm    15 cm Proximal to Ulnar Styloid Process  24 cm    10 cm Proximal to Ulnar Styloid Process  20.7 cm    Just Proximal to Ulnar Styloid Process  15 cm  Across Hand at [employer]  16.9 cm    At [place] of 2nd Digit  5.5 cm           Outpatient Re[facility] from [facility]15/[facility]  Lymphedema Life Impact Scale Total Score  10.29 %           [facility] Adult PT Treatment/Exercise - 08/24/18 0001      Manual Therapy   Manual therapy comments  Pt comes in with CircAid Reduction kit ( not the Juzo wrap requested) and said she work it until last night.  Called [facility] and they said they did not have the Juzo in [place] and she would not fit in the [place] so they gave the reduction kit. Pt was not sure how to apply the reduction kit so time was spent to teach her how to apply it.  She needs to hold left arm in her lap on a pillow to apply it so as not irritate right shoulder.    Edema Management  Remeasured left arm which has about .5 cm increase at several places since eariler this week               PT Education - 08/24/18 2104    Education provided  Yes    Education Details  application of veclro reduction kit for arm and hand piece     Person(s) Educated  Patient    Methods  Explanation    Comprehension  Need further instruction          PT Long Term Goals - 08/10/18 1725      PT LONG TERM GOAL #1   Title  Pt will report a 50% reduction in pain in right shoulder throughout the day     Time  4    Period  Weeks    Status  New      PT LONG TERM GOAL #2   Title  Pt wil be independent in a home exercise program for right shoulder     Time  4    Period  Weeks    Status  New      PT LONG TERM GOAL #3   Title  Pt will have 140 degrees of painfree right shoulder abduction     Baseline  65 o 08/10/2018    Time  4    Period  Weeks    Status  New      PT LONG TERM GOAL #4   Title  Pt will decrease quick DASH score to < 20 indicating an improvement in functional use of right arm     Baseline  43.18            Plan - 08/24/18 2106    Clinical Impression Statement  Pt is having difficulty with application of velcro garemnt on her left arm.  She is hopeful she will be able to get her husband to help her apply it. She wants to wear her daytime sleeve and nigttime velcro wrap.  She ordered a less expensive donning device than the [employer] and will bring it in to see if we can help her use it.. Her case is complicated by increase in lymphedema in her left arm and pain in right shoulder prohibiting her from application of compression garments and financial limit in getting the [facility] as suggested since she could use it with less stress to right shoudler     PT Next Visit Plan  Assess left arm lymphedema. if still increased, may need to consider compression wrapping.  If still shoulder pain, consider reapplication of ionto: Cont isometrics and progress to [place] when able, do ionto if cert is signed, cont scapular retraction and thoracic mobility exercise,  A/AA/PROM, consider taping , manual techiques as indicated        Patient will benefit from skilled therapeutic intervention in order to improve the following deficits and impairments:     Visit Diagnosis: Acute pain of right shoulder  Stiffness of right shoulder joint  Muscle weakness (generalized)  Postmastectomy lymphedema  Abnormal posture     Problem List Patient Active Problem List   Diagnosis Date Noted  . Lower extremity cellulitis 12/07/2017  . Genetic testing 08/22/2017  . Family history of breast cancer   . Pneumothorax on right 07/18/2017  . Malignant neoplasm of upper-outer quadrant of left breast in female, estrogen receptor positive ([place]) 06/30/2017   Donato Heinz. Owens Shark PT  Norwood Levo 08/24/2018, [place], [place], [address] Phone: [phone]   Fax:  [phone]  Name: Kathleen Hanson MRN: [id number] Date of Birth: 16-Apr-1936

## 2018-08-29 ENCOUNTER — Ambulatory Visit: Payer: Medicare HMO | Attending: Family Medicine | Admitting: Physical Therapy

## 2018-08-29 ENCOUNTER — Encounter: Payer: Self-pay | Admitting: Physical Therapy

## 2018-08-29 DIAGNOSIS — M25511 Pain in right shoulder: Secondary | ICD-10-CM | POA: Insufficient documentation

## 2018-08-29 DIAGNOSIS — M25611 Stiffness of right shoulder, not elsewhere classified: Secondary | ICD-10-CM | POA: Insufficient documentation

## 2018-08-29 DIAGNOSIS — M6281 Muscle weakness (generalized): Secondary | ICD-10-CM | POA: Diagnosis present

## 2018-08-29 DIAGNOSIS — I972 Postmastectomy lymphedema syndrome: Secondary | ICD-10-CM | POA: Diagnosis present

## 2018-08-29 DIAGNOSIS — R293 Abnormal posture: Secondary | ICD-10-CM | POA: Diagnosis present

## 2018-08-29 NOTE — Therapy (Signed)
[place], [place], [address] Phone: [phone]   Fax:  [phone]  Physical Therapy Treatment  Patient Details  Name: Kathleen Hanson MRN: [id number] Date of Birth: November 09, 1935 Referring Provider (PT): Dr. Bernerd Limbo    Encounter Date: 08/29/2018  PT End of Session - 08/29/18 1311    Visit Number  6    Number of Visits  9    PT Start Time  1310   pt arrived late today    PT Stop Time  1345    PT Time Calculation (min)  35 min       Past Medical History:  Diagnosis Date  . Cancer of left breast ([place])   . Constipation   . Family history of breast cancer   . Fibrocystic breast   . Hyperlipidemia   . Ocular migraine    "used to have them monthly for a period of time; seemed to stop; now have had a couple in the last week" (07/13/2017)    Past Surgical History:  Procedure Laterality Date  . BREAST BIOPSY Right ~ 2001  . BREAST EXCISIONAL BIOPSY Right   . BREAST LUMPECTOMY Left 2001   chemo and radiation  . CESAREAN SECTION  X 1  . IR CATHETER TUBE CHANGE  07/20/2017  . MASTECTOMY COMPLETE / SIMPLE W/ SENTINEL NODE BIOPSY Left 07/13/2017    LEFT MASTECTOMY WITH SENTINEL NODE MAPPING ERAS PATHWAY [name] 07/13/2017  . MASTECTOMY W/ SENTINEL NODE BIOPSY Left 07/13/2017   Procedure: LEFT MASTECTOMY WITH SENTINEL NODE MAPPING ERAS PATHWAY;  Surgeon: [name], MD;  Location: [facility];  Service: General;  Laterality: Left;  . PORT-A-CATH REMOVAL N/A 05/11/2018   Procedure: REMOVAL PORT-A-CATH;  Surgeon: Stark Klein, MD;  Location: [facility];  Service: General;  Laterality: N/A;  . PORTACATH PLACEMENT Right 07/13/2017  . PORTACATH PLACEMENT Right 07/13/2017   Procedure: INSERTION PORT-A-CATH;  Surgeon: [name], MD;  Location: [facility];  Service: General;  Laterality: Right;  . TONSILLECTOMY      There were no vitals filed for this visit.  Subjective Assessment - 08/29/18  1312    Subjective  Pt comes in wearing her daytime compression sleeve. She wore the velcro garment one time, but has not put it on again, but she wants to try again.  She admits to wearing her circular knit garment to sleep in although she was asked not to.              LYMPHEDEMA/ONCOLOGY QUESTIONNAIRE - 08/29/18 1315      Left Upper Extremity Lymphedema   15 cm Proximal to Olecranon Process  26.5 cm    10 cm Proximal to Olecranon Process  26 cm    Olecranon Process  25.5 cm    15 cm Proximal to Ulnar Styloid Process  23 cm    10 cm Proximal to Ulnar Styloid Process  19.5 cm    Just Proximal to Ulnar Styloid Process  14.5 cm    Across Hand at [employer]  16.9 cm    At [place] of 2nd Digit  5.6 cm           Outpatient Re[facility]15/[facility]  Lymphedema Life Impact Scale Total Score  10.29 %           [facility] Adult PT Treatment/Exercise - 08/29/18 0001      Exercises   Exercises  Shoulder  Shoulder Exercises: Supine   External Rotation  Strengthening;Right;Left;10 reps    External Rotation Limitations  no pain if humerus elevated on folded towel to align better with glenoid fossa       Shoulder Exercises: Seated   Retraction  Strengthening;Both;10 reps   hold for 5 sec counts    Other Seated Exercises  alternating isometrics with arm assited to elevation to > 120 degrees in painfree range       Manual Therapy   Manual therapy comments  Pt comes in wearing her juzo compression sleeve and arm was remeasured.  She has reduced since last week              PT Education - 08/29/18 2024    Education provided  Yes    Education Details  about the pathology of impingement syndrome using medbrige video     Person(s) Educated  Patient    Methods  Explanation    Comprehension  Verbalized understanding          PT Long Term Goals - 08/10/18 1725      PT LONG TERM GOAL #1   Title  Pt will report a 50%  reduction in pain in right shoulder throughout the day     Time  4    Period  Weeks    Status  New      PT LONG TERM GOAL #2   Title  Pt wil be independent in a home exercise program for right shoulder     Time  4    Period  Weeks    Status  New      PT LONG TERM GOAL #3   Title  Pt will have 140 degrees of painfree right shoulder abduction     Baseline  65 o 08/10/2018    Time  4    Period  Weeks    Status  New      PT LONG TERM GOAL #4   Title  Pt will decrease quick DASH score to < 20 indicating an improvement in functional use of right arm     Baseline  43.18            Plan - 08/29/18 2025    Clinical Impression Statement  Left arm circumferences show reduction today and pt seems happier wearing Juzo sleeve.  She is hopeful donning device arrives soon so she can see if she can put sleeve on with less pain to right shoulder.  Her shoulder pain in improving but she still has very painful arc around 90 degrees with more pain with initiation of active movment.  Continued isometric strengthening and  work in pain less areas.     Clinical Impairments Affecting Rehab Potential  Hx previous breast cancer on same side    PT Next Visit Plan  consider reapplication of ionto:progress to [place] when able, cont scapular retraction and thoracic mobility exercise, A/AA/PROM, consider taping , manual techiques as indicated     Consulted and Agree with Plan of Care  Patient       Patient will benefit from skilled therapeutic intervention in order to improve the following deficits and impairments:  Pain, Impaired UE functional use, Decreased range of motion, Decreased strength, Increased muscle spasms, Postural dysfunction  Visit Diagnosis: Acute pain of right shoulder  Stiffness of right shoulder joint  Muscle weakness (generalized)  Postmastectomy lymphedema  Abnormal posture     Problem List Patient Active Problem List   Diagnosis Date Noted  .  Lower extremity cellulitis  12/07/2017  . Genetic testing 08/22/2017  . Family history of breast cancer   . Pneumothorax on right 07/18/2017  . Malignant neoplasm of upper-outer quadrant of left breast in female, estrogen receptor positive ([place]) 06/30/2017   Donato Heinz. Owens Shark PT  Norwood Levo 08/29/2018, 8:30 PM  [place], [place], [address] Phone: [phone]   Fax:  [phone]  Name: Kathleen Hanson MRN: [id number] Date of Birth: May 12, 1936

## 2018-08-31 ENCOUNTER — Ambulatory Visit: Payer: Medicare HMO | Admitting: Physical Therapy

## 2018-09-05 ENCOUNTER — Encounter: Payer: Self-pay | Admitting: Physical Therapy

## 2018-09-05 ENCOUNTER — Ambulatory Visit: Payer: Medicare HMO | Admitting: Physical Therapy

## 2018-09-05 DIAGNOSIS — M25611 Stiffness of right shoulder, not elsewhere classified: Secondary | ICD-10-CM

## 2018-09-05 DIAGNOSIS — M25511 Pain in right shoulder: Secondary | ICD-10-CM | POA: Diagnosis not present

## 2018-09-05 DIAGNOSIS — M6281 Muscle weakness (generalized): Secondary | ICD-10-CM

## 2018-09-05 NOTE — Patient Instructions (Addendum)

## 2018-09-05 NOTE — Therapy (Signed)
[place], [place], [address] Phone: [phone]   Fax:  [phone]  Physical Therapy Treatment  Patient Details  Name: Kathleen Hanson MRN: [id number] Date of Birth: July 15, 1936 Referring Provider (PT): Dr. Bernerd Limbo    Encounter Date: 09/05/2018  PT End of Session - 09/05/18 2012    Visit Number  8    Number of Visits  9    Date for PT Re-Evaluation  09/10/18    PT Start Time  1315   pt arrives late   PT Stop Time  1345    PT Time Calculation (min)  30 min    Activity Tolerance  Patient tolerated treatment well    Behavior During Therapy  [facility] for tasks assessed/performed       Past Medical History:  Diagnosis Date  . Cancer of left breast ([place])   . Constipation   . Family history of breast cancer   . Fibrocystic breast   . Hyperlipidemia   . Ocular migraine    "used to have them monthly for a period of time; seemed to stop; now have had a couple in the last week" (07/13/2017)    Past Surgical History:  Procedure Laterality Date  . BREAST BIOPSY Right ~ 2001  . BREAST EXCISIONAL BIOPSY Right   . BREAST LUMPECTOMY Left 2001   chemo and radiation  . CESAREAN SECTION  X 1  . IR CATHETER TUBE CHANGE  07/20/2017  . MASTECTOMY COMPLETE / SIMPLE W/ SENTINEL NODE BIOPSY Left 07/13/2017    LEFT MASTECTOMY WITH SENTINEL NODE MAPPING ERAS PATHWAY [name] 07/13/2017  . MASTECTOMY W/ SENTINEL NODE BIOPSY Left 07/13/2017   Procedure: LEFT MASTECTOMY WITH SENTINEL NODE MAPPING ERAS PATHWAY;  Surgeon: [name], MD;  Location: [facility];  Service: General;  Laterality: Left;  . PORT-A-CATH REMOVAL N/A 05/11/2018   Procedure: REMOVAL PORT-A-CATH;  Surgeon: Stark Klein, MD;  Location: [facility];  Service: General;  Laterality: N/A;  . PORTACATH PLACEMENT Right 07/13/2017  . PORTACATH PLACEMENT Right 07/13/2017   Procedure: INSERTION PORT-A-CATH;  Surgeon: [name], MD;   Location: [facility];  Service: General;  Laterality: Right;  . TONSILLECTOMY      There were no vitals filed for this visit.  Subjective Assessment - 09/05/18 1324    Subjective  Pt states that her right shoulder is getting better.  She comes in with the Easy Glide to help her apply her sleeve, but it is too big and needs to be returned. She states she is doing isometric exercies at home as well as scapular retraction and describes bilateral rows with red theraband     Pertinent History  Lt lumpectomy 2001 with chemo and radiation and then mastectomy 2018 with SLNB 0/2 with chemotherapy again.  Now just on anastrozole.  high cholesterol, developed acute shoulder pain     Patient Stated Goals  to get rid of her right shoulder pain     Currently in Pain?  No/denies                  Outpatient Rehab from [facility]/15/2018 [facility]  Lymphedema Life Impact Scale Total Score  10.29 %           [facility] Adult PT Treatment/Exercise - 09/05/18 0001      Self-Care   Other Self-Care Comments   practiced applying her sleeve with the Medi butler and pt was able to do so  with min assist without using her right arm much in impinged position.  She should be able to do this independently with a few more practice sessions.       Exercises   Exercises  Shoulder      Shoulder Exercises: Supine   Other Supine Exercises  attempted supine scapular series but pt was not able to do so without pain.     Other Supine Exercises  supine dowel rod for flexion stretches with decreasing pain as she has more repetitions of exercise.  She was not able to do abduction without pain but wa able to do the butterfuly stretch and external rotation.      Manual Therapy   Manual therapy comments  PROM to right shoulder                  PT Long Term Goals - 08/10/18 1725      PT LONG TERM GOAL #1   Title  Pt will report a 50% reduction in pain in right shoulder  throughout the day     Time  4    Period  Weeks    Status  New      PT LONG TERM GOAL #2   Title  Pt wil be independent in a home exercise program for right shoulder     Time  4    Period  Weeks    Status  New      PT LONG TERM GOAL #3   Title  Pt will have 140 degrees of painfree right shoulder abduction     Baseline  65 o 08/10/2018    Time  4    Period  Weeks    Status  New      PT LONG TERM GOAL #4   Title  Pt will decrease quick DASH score to < 20 indicating an improvement in functional use of right arm     Baseline  43.18            Plan - 09/05/18 2013    Clinical Impression Statement  Pt continually arrives late to treatment and has trouble following directions.  Therefore, her progress has been slow.  Her left arm lymphedema has stabalized and she is wearing her compression sleeve, but still does not have the correct donning device.  She said she will order the Medi bulter.  While she says her shoulder pain in better, she still has a painful arc around 90 degrees that limit her exercise progressio,  She does have nearly full range of elevation passively once she gets> 90 degrees.      Clinical Impairments Affecting Rehab Potential  Hx previous breast cancer on same side    PT Treatment/Interventions  ADLs/Self Care Home Management;Iontophoresis 4mg /ml Dexamethasone;Passive range of motion;Electrical Stimulation;DME Instruction;Therapeutic exercise;Therapeutic activities;Patient/family education;Orthotic Fit/Training;Manual lymph drainage;Manual techniques;Taping;Joint Manipulations    PT Next Visit Plan  progress to [place] when able below 90 degrees and porgress to supine scapular series once painfree. , cont scapular retraction and thoracic mobility exercise, A/AA/PROM,consider taping , manual techiques as indicated if pain persists, try ionto again    Consulted and Agree with Plan of Care  Patient       Patient will benefit from skilled therapeutic intervention in  order to improve the following deficits and impairments:  Pain, Impaired UE functional use, Decreased range of motion, Decreased strength, Increased muscle spasms, Postural dysfunction  Visit Diagnosis: Acute pain of right shoulder  Stiffness of right shoulder joint  Muscle weakness (generalized)     Problem List Patient Active Problem List   Diagnosis Date Noted  . Lower extremity cellulitis 12/07/2017  . Genetic testing 08/22/2017  . Family history of breast cancer   . Pneumothorax on right 07/18/2017  . Malignant neoplasm of upper-outer quadrant of left breast in female, estrogen receptor positive ([place]) 06/30/2017   Donato Heinz. Owens Shark PT  Norwood Levo 09/05/2018, 8:20 PM  [place], [place], [address] Phone: [phone]   Fax:  [phone]  Name: Kathleen Hanson MRN: [id number] Date of Birth: 03-11-1936

## 2018-09-07 ENCOUNTER — Ambulatory Visit: Payer: Medicare HMO | Admitting: Rehabilitation

## 2018-09-07 ENCOUNTER — Encounter: Payer: Medicare HMO | Admitting: Physical Therapy

## 2018-09-07 ENCOUNTER — Encounter: Payer: Self-pay | Admitting: Rehabilitation

## 2018-09-07 DIAGNOSIS — M25611 Stiffness of right shoulder, not elsewhere classified: Secondary | ICD-10-CM

## 2018-09-07 DIAGNOSIS — I972 Postmastectomy lymphedema syndrome: Secondary | ICD-10-CM

## 2018-09-07 DIAGNOSIS — M25511 Pain in right shoulder: Secondary | ICD-10-CM

## 2018-09-07 DIAGNOSIS — M6281 Muscle weakness (generalized): Secondary | ICD-10-CM

## 2018-09-07 NOTE — Therapy (Signed)
[place], [place], [address] Phone: [phone]   Fax:  [phone]  Physical Therapy Treatment  Patient Details  Name: Kathleen Hanson MRN: [id number] Date of Birth: 12-28-1935 Referring Provider (PT): Dr. Bernerd Limbo    Encounter Date: 09/07/2018  PT End of Session - 09/07/18 1601    Visit Number  9    Number of Visits  15    Date for PT Re-Evaluation  09/28/18    PT Start Time  1430    PT Stop Time  1515    PT Time Calculation (min)  45 min    Activity Tolerance  Patient tolerated treatment well    Behavior During Therapy  [facility] for tasks assessed/performed       Past Medical History:  Diagnosis Date  . Cancer of left breast ([place])   . Constipation   . Family history of breast cancer   . Fibrocystic breast   . Hyperlipidemia   . Ocular migraine    "used to have them monthly for a period of time; seemed to stop; now have had a couple in the last week" (07/13/2017)    Past Surgical History:  Procedure Laterality Date  . BREAST BIOPSY Right ~ 2001  . BREAST EXCISIONAL BIOPSY Right   . BREAST LUMPECTOMY Left 2001   chemo and radiation  . CESAREAN SECTION  X 1  . IR CATHETER TUBE CHANGE  07/20/2017  . MASTECTOMY COMPLETE / SIMPLE W/ SENTINEL NODE BIOPSY Left 07/13/2017    LEFT MASTECTOMY WITH SENTINEL NODE MAPPING ERAS PATHWAY [name] 07/13/2017  . MASTECTOMY W/ SENTINEL NODE BIOPSY Left 07/13/2017   Procedure: LEFT MASTECTOMY WITH SENTINEL NODE MAPPING ERAS PATHWAY;  Surgeon: [name], MD;  Location: [facility];  Service: General;  Laterality: Left;  . PORT-A-CATH REMOVAL N/A 05/11/2018   Procedure: REMOVAL PORT-A-CATH;  Surgeon: Stark Klein, MD;  Location: [facility];  Service: General;  Laterality: N/A;  . PORTACATH PLACEMENT Right 07/13/2017  . PORTACATH PLACEMENT Right 07/13/2017   Procedure: INSERTION PORT-A-CATH;  Surgeon: [name], MD;  Location: [facility];   Service: General;  Laterality: Right;  . TONSILLECTOMY      There were no vitals filed for this visit.  Subjective Assessment - 09/07/18 1434    Subjective  It feels good and then it hurts again when I put my sleeve on     Pertinent History  Lt lumpectomy 2001 with chemo and radiation and then mastectomy 2018 with SLNB 0/2 with chemotherapy again.  Now just on anastrozole.  high cholesterol, developed acute shoulder pain     Patient Stated Goals  to get rid of her right shoulder pain     Currently in Pain?  No/denies   nothing at rest        [facility] PT Assessment - 09/07/18 0001      Assessment   Medical Diagnosis  Lt mastectomy/UE lymphedema right shoulder pain     Referring Provider (PT)  Dr. Bernerd Limbo     Onset Date/Surgical Date  02/22/18      AROM   Right Shoulder Flexion  55 Degrees    Right Shoulder ABduction  55 Degrees              Outpatient Re[facility] from [facility]/15/[facility]  Lymphedema Life Impact Scale Total Score  10.29 %           [facility] Adult PT Treatment/Exercise -  09/07/18 0001      Exercises   Exercises  Shoulder      Shoulder Exercises: Supine   Other Supine Exercises  attempted supine flexion and punch but too hard      Shoulder Exercises: Standing   External Rotation  Right;10 reps;Theraband    Theraband Level (Shoulder External Rotation)  Level 1 (Yellow)    External Rotation Limitations  vcs for positioning    Internal Rotation  Right;10 reps;Theraband    Theraband Level (Shoulder Internal Rotation)  Level 1 (Yellow)    Flexion Limitations  attempted punch but too hard    Extension  Both;20 reps    Theraband Level (Shoulder Extension)  Level 2 (Red)    Row  Theraband;Both;20 reps    Theraband Level (Shoulder Row)  Level 2 (Red)      Manual Therapy   Manual therapy comments  PROM to right shoulder                  PT Long Term Goals - 09/07/18 1703      PT LONG TERM GOAL #1    Title  Pt will report a 50% reduction in pain in right shoulder throughout the day     Status  On-going      PT LONG TERM GOAL #2   Title  Pt wil be independent in a home exercise program for right shoulder     Status  On-going      PT LONG TERM GOAL #3   Title  Pt will have 140 degrees of painfree right shoulder abduction     Status  On-going      PT LONG TERM GOAL #4   Title  Pt will decrease quick DASH score to < 20 indicating an improvement in functional use of right arm     Status  On-going            Plan - 09/07/18 1652    Clinical Impression Statement  Pt presenting with signs consistent with a possible RTC so she is most likely going to make an orthopedic MD appt to check on this.  Her AROM has worsened about 10deg since last check with most activity deltoid in origin.  She was able to tolerate RTC and scapular strengthening as performed today.  Pt called back after treatment to make sure that the medi butler is the correct size at [facility] and then is going to call [facility] to also verify it is the correct one.  Pts POC was extended today but she may be ready for DC with HEP until ortho visit.     PT Frequency  2x / week    PT Duration  4 weeks    PT Treatment/Interventions  ADLs/Self Care Home Management;Iontophoresis 4mg /ml Dexamethasone;Passive range of motion;Electrical Stimulation;DME Instruction;Therapeutic exercise;Therapeutic activities;Patient/family education;Orthotic Fit/Training;Manual lymph drainage;Manual techniques;Taping;Joint Manipulations    PT Next Visit Plan  ortho appt?  HEP or continue visits for Rt shoulder strength?    Consulted and Agree with Plan of Care  Patient       Patient will benefit from skilled therapeutic intervention in order to improve the following deficits and impairments:  Pain, Impaired UE functional use, Decreased range of motion, Decreased strength, Increased muscle spasms, Postural dysfunction  Visit Diagnosis: Acute pain of  right shoulder  Stiffness of right shoulder joint  Muscle weakness (generalized)  Postmastectomy lymphedema     Problem List Patient Active Problem List   Diagnosis Date Noted  .  Lower extremity cellulitis 12/07/2017  . Genetic testing 08/22/2017  . Family history of breast cancer   . Pneumothorax on right 07/18/2017  . Malignant neoplasm of upper-outer quadrant of left breast in female, estrogen receptor positive ([place]) 06/30/2017    Shan Levans, PT 09/07/2018, 5:06 PM  [place], [place], [address] Phone: [phone]   Fax:  [phone]  Name: Kathleen Hanson MRN: [id number] Date of Birth: [birth year]

## 2018-09-07 NOTE — Patient Instructions (Signed)

## 2018-09-12 ENCOUNTER — Ambulatory Visit: Payer: Medicare HMO | Admitting: Physical Therapy

## 2018-09-12 DIAGNOSIS — I972 Postmastectomy lymphedema syndrome: Secondary | ICD-10-CM

## 2018-09-12 DIAGNOSIS — M25611 Stiffness of right shoulder, not elsewhere classified: Secondary | ICD-10-CM

## 2018-09-12 DIAGNOSIS — M6281 Muscle weakness (generalized): Secondary | ICD-10-CM

## 2018-09-12 DIAGNOSIS — R293 Abnormal posture: Secondary | ICD-10-CM

## 2018-09-12 DIAGNOSIS — M25511 Pain in right shoulder: Secondary | ICD-10-CM

## 2018-09-12 NOTE — Patient Instructions (Signed)
On Medbridge,horizontal abduction and external rotation with yellow theraband  Towel roll squeeze in hand with elbow bent for deltoid activation

## 2018-09-12 NOTE — Therapy (Signed)
[place], [place], [address] Phone: [phone]   Fax:  [phone]  Physical Therapy Treatment     Patient Details  Name: Kathleen Hanson MRN: [id number] Date of Birth: 1936/08/29 Referring Provider (PT): Dr. Bernerd Limbo   Progress Note Reporting Period 08/10/2018  to 09/12/2018  See note below for Objective Data and Assessment of Progress/Goals.      Encounter Date: 09/12/2018  PT End of Session - 09/12/18 1730    Visit Number  10    Number of Visits  15    Date for PT Re-Evaluation  09/28/18    PT Start Time  1430    PT Stop Time  1515    PT Time Calculation (min)  45 min    Activity Tolerance  Patient tolerated treatment well    Behavior During Therapy  [facility] for tasks assessed/performed       Past Medical History:  Diagnosis Date  . Cancer of left breast ([place])   . Constipation   . Family history of breast cancer   . Fibrocystic breast   . Hyperlipidemia   . Ocular migraine    "used to have them monthly for a period of time; seemed to stop; now have had a couple in the last week" (07/13/2017)    Past Surgical History:  Procedure Laterality Date  . BREAST BIOPSY Right ~ 2001  . BREAST EXCISIONAL BIOPSY Right   . BREAST LUMPECTOMY Left 2001   chemo and radiation  . CESAREAN SECTION  X 1  . IR CATHETER TUBE CHANGE  07/20/2017  . MASTECTOMY COMPLETE / SIMPLE W/ SENTINEL NODE BIOPSY Left 07/13/2017    LEFT MASTECTOMY WITH SENTINEL NODE MAPPING ERAS PATHWAY [name] 07/13/2017  . MASTECTOMY W/ SENTINEL NODE BIOPSY Left 07/13/2017   Procedure: LEFT MASTECTOMY WITH SENTINEL NODE MAPPING ERAS PATHWAY;  Surgeon: [name], MD;  Location: [place];  Service: General;  Laterality: Left;  . PORT-A-CATH REMOVAL N/A 05/11/2018   Procedure: REMOVAL PORT-A-CATH;  Surgeon: Stark Klein, MD;  Location: [place];  Service: General;  Laterality: N/A;  . PORTACATH PLACEMENT Right  07/13/2017  . PORTACATH PLACEMENT Right 07/13/2017   Procedure: INSERTION PORT-A-CATH;  Surgeon: [name], MD;  Location: [place];  Service: General;  Laterality: Right;  . TONSILLECTOMY      There were no vitals filed for this visit.  Subjective Assessment - 09/12/18 1719    Subjective  Pt states her arm is feeling pretty good most of the time, but then it catches and hurts really bad sometimes.  She has not made an appointment with an ortho MD yet. She had not yet ordered the [facility] but talked with a seamstress about making her a donning assist like the one she has to put her stockings on. She wants to have more exercises on her Medbridge to Go app     Pertinent History  Lt lumpectomy 2001 with chemo and radiation and then mastectomy 2018 with SLNB 0/2 with chemotherapy again.  Now just on anastrozole.  high cholesterol, developed acute shoulder pain     Patient Stated Goals  to get rid of her right shoulder pain     Currently in Pain?  No/denies   no pain at rest, but sharp pains with active flexion                  Outpatient Rehab from 05/15/2018 in [place]  Lymphedema Life Impact Scale Total Score  10.29 %           [facility] Adult PT Treatment/Exercise - 09/12/18 0001      Exercises   Exercises  Shoulder      Shoulder Exercises: Supine   Horizontal ABduction  Strengthening;Right;Left;10 reps;Theraband    Theraband Level (Shoulder Horizontal ABduction)  Level 1 (Yellow)    External Rotation  Strengthening;Right;Left;10 reps    Theraband Level (Shoulder External Rotation)  Level 1 (Yellow)    Other Supine Exercises  pt is able to do bilateral dowel flexion from aobut 100- 150 degrees with no pain.  Able to do better short lever arm ( elbow bent ) flexion.       Shoulder Exercises: Seated   Other Seated Exercises  elbow bent at side, squeezing a towel x 10 reps for involuntary deltoid activation       Shoulder Exercises:  Standing   Row  Strengthening;Right;Left;10 reps;Theraband    Theraband Level (Shoulder Row)  Level 1 (Yellow)      Manual Therapy   Manual therapy comments  PROM to right shoulder                  PT Long Term Goals - 09/07/18 1703      PT LONG TERM GOAL #1   Title  Pt will report a 50% reduction in pain in right shoulder throughout the day     Status  On-going      PT LONG TERM GOAL #2   Title  Pt wil be independent in a home exercise program for right shoulder     Status  On-going      PT LONG TERM GOAL #3   Title  Pt will have 140 degrees of painfree right shoulder abduction     Status  On-going      PT LONG TERM GOAL #4   Title  Pt will decrease quick DASH score to < 20 indicating an improvement in functional use of right arm     Status  On-going            Plan - 09/12/18 1732    Clinical Impression Statement  Pt continues with pain in anterior shoulder with active flexion that occasionally limits her activities.  She has not yet made an orthopedic MD appt or purchased the recommended device to help her don her ocmpression sleeve without impinging her right shoulder.  She will decide about doing both of those things next week and return for last visit the first week of December.     Rehab Potential  Good    Clinical Impairments Affecting Rehab Potential  Hx previous breast cancer on same side    PT Frequency  2x / week    PT Duration  4 weeks    PT Treatment/Interventions  ADLs/Self Care Home Management;Iontophoresis 4mg /ml Dexamethasone;Passive range of motion;Electrical Stimulation;DME Instruction;Therapeutic exercise;Therapeutic activities;Patient/family education;Orthotic Fit/Training;Manual lymph drainage;Manual techniques;Taping;Joint Manipulations    PT Next Visit Plan  reassess, find out if pt has gotten her ortho visit or medi butler, consider discharge        Patient will benefit from skilled therapeutic intervention in order to improve the  following deficits and impairments:  Pain, Impaired UE functional use, Decreased range of motion, Decreased strength, Increased muscle spasms, Postural dysfunction  Visit Diagnosis: Acute pain of right shoulder  Stiffness of right shoulder joint  Muscle weakness (generalized)  Postmastectomy lymphedema  Abnormal posture     Problem  List Patient Active Problem List   Diagnosis Date Noted  . Lower extremity cellulitis 12/07/2017  . Genetic testing 08/22/2017  . Family history of breast cancer   . Pneumothorax on right 07/18/2017  . Malignant neoplasm of upper-outer quadrant of left breast in female, estrogen receptor positive ([place]) 06/30/2017   Donato Heinz. Owens Shark PT  Norwood Levo 09/12/2018, 5:37 PM  [place], [place], [address] Phone: [phone]   Fax:  [phone]  Name: Kathleen Hanson MRN: [id number] Date of Birth: 11-05-1935

## 2018-09-26 ENCOUNTER — Ambulatory Visit: Payer: Medicare HMO | Admitting: Physical Therapy

## 2018-10-04 ENCOUNTER — Encounter: Payer: Self-pay | Admitting: Physical Therapy

## 2018-10-04 ENCOUNTER — Ambulatory Visit: Payer: Medicare HMO | Attending: Family Medicine | Admitting: Physical Therapy

## 2018-10-04 DIAGNOSIS — M25511 Pain in right shoulder: Secondary | ICD-10-CM | POA: Diagnosis present

## 2018-10-04 DIAGNOSIS — M6281 Muscle weakness (generalized): Secondary | ICD-10-CM | POA: Insufficient documentation

## 2018-10-04 DIAGNOSIS — R293 Abnormal posture: Secondary | ICD-10-CM | POA: Insufficient documentation

## 2018-10-04 DIAGNOSIS — M25611 Stiffness of right shoulder, not elsewhere classified: Secondary | ICD-10-CM | POA: Insufficient documentation

## 2018-10-04 NOTE — Therapy (Signed)
[place], [place], [address] Phone: [phone]   Fax:  [phone]  Physical Therapy Treatment  Patient Details  Name: Kathleen Hanson MRN: [id number] Date of Birth: October 02, 1936 Referring Provider (PT): Dr. Bernerd Limbo    Encounter Date: 10/04/2018  PT End of Session - 10/04/18 1633    Visit Number  11    Number of Visits  15    Date for PT Re-Evaluation  09/28/18    PT Start Time  1430    PT Stop Time  1515    PT Time Calculation (min)  45 min    Activity Tolerance  Patient tolerated treatment well    Behavior During Therapy  [facility] for tasks assessed/performed       Past Medical History:  Diagnosis Date  . Cancer of left breast ([place])   . Constipation   . Family history of breast cancer   . Fibrocystic breast   . Hyperlipidemia   . Ocular migraine    "used to have them monthly for a period of time; seemed to stop; now have had a couple in the last week" (07/13/2017)    Past Surgical History:  Procedure Laterality Date  . BREAST BIOPSY Right ~ 2001  . BREAST EXCISIONAL BIOPSY Right   . BREAST LUMPECTOMY Left 2001   chemo and radiation  . CESAREAN SECTION  X 1  . IR CATHETER TUBE CHANGE  07/20/2017  . MASTECTOMY COMPLETE / SIMPLE W/ SENTINEL NODE BIOPSY Left 07/13/2017    LEFT MASTECTOMY WITH SENTINEL NODE MAPPING ERAS PATHWAY [name] 07/13/2017  . MASTECTOMY W/ SENTINEL NODE BIOPSY Left 07/13/2017   Procedure: LEFT MASTECTOMY WITH SENTINEL NODE MAPPING ERAS PATHWAY;  Surgeon: [name], MD;  Location: [place];  Service: General;  Laterality: Left;  . PORT-A-CATH REMOVAL N/A 05/11/2018   Procedure: REMOVAL PORT-A-CATH;  Surgeon: Stark Klein, MD;  Location: [place];  Service: General;  Laterality: N/A;  . PORTACATH PLACEMENT Right 07/13/2017  . PORTACATH PLACEMENT Right 07/13/2017   Procedure: INSERTION PORT-A-CATH;  Surgeon: [name], MD;  Location: [place];   Service: General;  Laterality: Right;  . TONSILLECTOMY      There were no vitals filed for this visit.  Subjective Assessment - 10/04/18 1437    Subjective  Pt states she is feeling better.  She has had some pain in left shoulder too, but overall is better.  She wants to review how to do the self manual lymph drainage.  She has not gotten the [employer], but finds it is getting easier to put her sleeve on.  She wears her sleeve maybe 3 to 4 times a week.     Pertinent History  Lt lumpectomy 2001 with chemo and radiation and then mastectomy 2018 with SLNB 0/2 with chemotherapy again.  Now just on anastrozole.  high cholesterol, developed acute shoulder pain     Patient Stated Goals  to get rid of her right shoulder pain , On 12/11/ 2019 she says her right shoulder pain is much better, but she still has some residual pain     Currently in Pain?  Yes         [facility] PT Assessment - 10/04/18 0001      AROM   Right Shoulder Flexion  145 Degrees   with no pain    Right Shoulder ABduction  126 Degrees    in scaption   Right Shoulder Internal Rotation  30  Degrees        LYMPHEDEMA/ONCOLOGY QUESTIONNAIRE - 10/04/18 1450      Left Upper Extremity Lymphedema   15 cm Proximal to Olecranon Process  26.5 cm    10 cm Proximal to Olecranon Process  26 cm    Olecranon Process  25.5 cm    15 cm Proximal to Ulnar Styloid Process  23.3 cm    10 cm Proximal to Ulnar Styloid Process  19.5 cm    Just Proximal to Ulnar Styloid Process  14.4 cm    Across Hand at [employer]  16.8 cm    At [place] of 2nd Digit  5.6 cm           [facility]  Lymphedema Life Impact Scale Total Score  10.29 %           [facility] Adult PT Treatment/Exercise - 10/04/18 0001      Exercises   Exercises  Shoulder      Shoulder Exercises: Standing   Other Standing Exercises  standing dowel flexion, abduction and extension       Manual Therapy    Edema Management  Remeasured arm.  Applied compression sleeve at end of session and enouraged pt to wear it more to keep left forearm decongested     Manual Lymphatic Drainage (MLD)  Reviewed self manual lymph drainage per handout with having pt perform each step with hand over hand instruction and VCs.  Emphasized elevation of left forearm with extra work in this area                   [place] - 10/04/18 1636      PT LONG TERM GOAL #1   Title  Pt will report a 50% reduction in pain in right shoulder throughout the day     Status  Achieved      PT LONG TERM GOAL #2   Title  Pt wil be independent in a home exercise program for right shoulder     Status  Achieved      PT LONG TERM GOAL #3   Title  Pt will have 140 degrees of painfree right shoulder abduction     Baseline  65 o 08/10/2018, 126 on 10/04/2018     Status  Partially Met      PT LONG TERM GOAL #4   Title  Pt will decrease quick DASH score to < 20 indicating an improvement in functional use of right arm     Baseline  43.18 on eval  ( did not assess on discharge )     Status  Deferred            Plan - 10/04/18 1633    Clinical Impression Statement  Overall, pt feels much better and feels she has increased function of right shoulder with less pain.  She still has limitation in internal rotation and occasional "twinges".  She has not yet gotten a donning assist for sleeve which may help keep right shoulder from getting irritated again, but she will consider it  She has all of her home exercises with her and intends to keep doing them as well as be better about doing the manual lymph drainage to her arm and wear her sleeve more frequently  She feels that she is ready for discharge     Clinical Impairments Affecting Rehab Potential  Hx previous breast cancer on same side  PT Next Visit Plan  discharge this episode        Patient will benefit from skilled therapeutic intervention in order to  improve the following deficits and impairments:  Pain, Impaired UE functional use, Decreased range of motion, Decreased strength, Increased muscle spasms, Postural dysfunction  Visit Diagnosis: Acute pain of right shoulder  Stiffness of right shoulder joint  Muscle weakness (generalized)  Abnormal posture     Problem List Patient Active Problem List   Diagnosis Date Noted  . Lower extremity cellulitis 12/07/2017  . Genetic testing 08/22/2017  . Family history of breast cancer   . Pneumothorax on right 07/18/2017  . Malignant neoplasm of upper-outer quadrant of left breast in female, estrogen receptor positive ([place]) 06/30/2017     PHYSICAL THERAPY DISCHARGE SUMMARY  Visits from Start of Care: 15  Current functional level related to goals / functional outcomes: As above . Pt feels she is doing much better    Remaining deficits: decreaed ROM in right shoulder, but less pain, fullness in left forearm    Education / Equipment: Home exercise, self manual lymph drainage  Plan: Patient agrees to discharge.  Patient goals were partially met. Patient is being discharged due to being pleased with the current functional level.  ?????     Donato Heinz. Owens Shark PT  Norwood Levo 10/04/2018, 4:38 PM  [place], [place], [address] Phone: [phone]   Fax:  [phone]  Name: Kathleen Hanson MRN: [id number] Date of Birth: 08-Aug-1936

## 2018-10-04 NOTE — Patient Instructions (Signed)
Flexion (Eccentric) - Active-Assist (Cane)          Cancer Rehab [phone]    Use unaffected arm to push affected arm forward. Avoid hiking shoulder (shoulder should NOT touch cheek). Keep palm relaxed. Slowly lower affected arm. Hold stretch for _5_ seconds repeating _5-10_ times, _1-2_ times a day.  Abduction (Eccentric) - Active-Assist (Cane)    Use unaffected arm to push affected arm out to side. Avoid hiking shoulder (shoulder should NOT touch cheek). Keep palm relaxed. Slowly lower affected arm. Hold stretch _5_ seconds repeating _5-10_ times, _1-2_ times a day.  Cane Exercise: Extension   Stand holding cane behind back with both hands palm-up. Lift the cane away from body until gentle stretch felt. Do NOT lean forward.  Hold __5__ seconds. Repeat _5-10___ times. Do _1-2___ sessions per day.    

## 2018-11-15 ENCOUNTER — Other Ambulatory Visit: Payer: Self-pay | Admitting: Physician Assistant

## 2018-11-15 DIAGNOSIS — C439 Malignant melanoma of skin, unspecified: Secondary | ICD-10-CM

## 2018-11-15 HISTORY — DX: Malignant melanoma of skin, unspecified: C43.9

## 2018-11-17 ENCOUNTER — Encounter: Payer: Self-pay | Admitting: Hematology

## 2018-11-17 ENCOUNTER — Inpatient Hospital Stay: Payer: Medicare HMO | Attending: Hematology

## 2018-11-17 ENCOUNTER — Telehealth: Payer: Self-pay | Admitting: Hematology

## 2018-11-17 ENCOUNTER — Inpatient Hospital Stay: Payer: Medicare HMO | Admitting: Hematology

## 2018-11-17 VITALS — BP 161/61 | HR 61 | Temp 98.3°F | Resp 18 | Ht 64.0 in | Wt 137.5 lb

## 2018-11-17 DIAGNOSIS — Z170 Estrogen receptor positive status [ER+]: Secondary | ICD-10-CM | POA: Diagnosis not present

## 2018-11-17 DIAGNOSIS — M8589 Other specified disorders of bone density and structure, multiple sites: Secondary | ICD-10-CM

## 2018-11-17 DIAGNOSIS — Z1321 Encounter for screening for nutritional disorder: Secondary | ICD-10-CM

## 2018-11-17 DIAGNOSIS — C50412 Malignant neoplasm of upper-outer quadrant of left female breast: Secondary | ICD-10-CM

## 2018-11-17 DIAGNOSIS — Z79811 Long term (current) use of aromatase inhibitors: Secondary | ICD-10-CM | POA: Insufficient documentation

## 2018-11-17 DIAGNOSIS — Z9012 Acquired absence of left breast and nipple: Secondary | ICD-10-CM | POA: Diagnosis not present

## 2018-11-17 DIAGNOSIS — Z9221 Personal history of antineoplastic chemotherapy: Secondary | ICD-10-CM | POA: Insufficient documentation

## 2018-11-17 DIAGNOSIS — Z79899 Other long term (current) drug therapy: Secondary | ICD-10-CM | POA: Diagnosis not present

## 2018-11-17 LAB — COMPREHENSIVE METABOLIC PANEL
ALT: 18 U/L (ref 0–44)
AST: 17 U/L (ref 15–41)
Albumin: 3.9 g/dL (ref 3.5–5.0)
Alkaline Phosphatase: 91 U/L (ref 38–126)
Anion gap: 8 (ref 5–15)
BUN: 13 mg/dL (ref 8–23)
CHLORIDE: 102 mmol/L (ref 98–111)
CO2: 29 mmol/L (ref 22–32)
CREATININE: 0.71 mg/dL (ref 0.44–1.00)
Calcium: 8.9 mg/dL (ref 8.9–10.3)
GFR calc Af Amer: >60 mL/min (ref >60)
GFR calc non Af Amer: >60 mL/min (ref >60)
GLUCOSE: 98 mg/dL (ref 70–99)
POTASSIUM: 4.1 mmol/L (ref 3.5–5.1)
SODIUM: 139 mmol/L (ref 135–145)
Total Bilirubin: 0.4 mg/dL (ref 0.3–1.2)
Total Protein: 6.8 g/dL (ref 6.5–8.1)

## 2018-11-17 LAB — CBC WITH DIFFERENTIAL/PLATELET
ABS IMMATURE GRANULOCYTES: 0.01 10*3/uL (ref 0.00–0.07)
BASOS PCT: 0 %
Basophils Absolute: 0 10*3/uL (ref 0.0–0.1)
EOS ABS: 0.1 10*3/uL (ref 0.0–0.5)
EOS PCT: 1 %
HCT: 40.3 % (ref 36.0–46.0)
Hemoglobin: 13.4 g/dL (ref 12.0–15.0)
Immature Granulocytes: 0 %
Lymphocytes Relative: 20 %
Lymphs Abs: 0.8 10*3/uL (ref 0.7–4.0)
MCH: 30.7 pg (ref 26.0–34.0)
MCHC: 33.3 g/dL (ref 30.0–36.0)
MCV: 92.4 fL (ref 80.0–100.0)
Monocytes Absolute: 0.4 10*3/uL (ref 0.1–1.0)
Monocytes Relative: 10 %
NEUTROS ABS: 2.9 10*3/uL (ref 1.7–7.7)
Neutrophils Relative %: 69 %
PLATELETS: 216 10*3/uL (ref 150–400)
RBC: 4.36 MIL/uL (ref 3.87–5.11)
RDW: 12.8 % (ref 11.5–15.5)
WBC: 4.2 10*3/uL (ref 4.0–10.5)
nRBC: 0 % (ref 0.0–0.2)

## 2018-11-17 MED ORDER — ANASTROZOLE 1 MG PO TABS: 1.0000 mg | ORAL_TABLET | Freq: Every day | ORAL | 1 refills | Status: DC

## 2018-11-17 NOTE — Progress Notes (Signed)
Kathleen Hanson   [phone] [phone]   Clinic Follow up Note   Patient Care Team: Puglisi, Malachy Chamber, FNP as PCP - General (Nurse Practitioner) [name], MD as Consulting Physician (General Surgery) [name], MD as Consulting Physician (Hematology) [name], MD as Consulting Physician (Radiation Oncology) Gardenia Phlegm, NP as Nurse Practitioner (Hematology and Oncology)  Date of Service:  11/17/2018  CHIEF COMPLAINT: F/u of left breast cancer   SUMMARY OF ONCOLOGIC HISTORY: Oncology History   Cancer Staging Malignant neoplasm of upper-outer quadrant of left breast in female, estrogen receptor positive ([place]) Staging form: Breast, AJCC 8th Edition - Clinical stage from 07/06/2017: Stage IA (cT1c, cN0, cM0, G3, ER: Positive, PR: Positive, HER2: Positive) - Unsigned Staging comments: Staged at breast conference 9.12.18 - Pathologic stage from 07/13/2017: Stage IA (pT2, pN0, cM0, G3, ER: Positive, PR: Positive, HER2: Positive) - Signed by [name], MD on 08/16/2017       Malignant neoplasm of upper-outer quadrant of left breast in female, estrogen receptor positive ([place])   06/23/2017 Mammogram    Diagnostic mammogram 06/23/17 IMPRESSION: Indeterminate mass with irregular margins at the 2:30 position 4 cm from nipple measuring 1.4 x 0.8 x 1.2 cm in the upper-outer left breast.    06/28/2017 Initial Biopsy    Diagnosis 06/28/17 Breast, left, needle core biopsy, upper outer quadrant, 2:30 o'clock, 4cm from nipple - INVASIVE MAMMARY CARCINOMA WITH CALCIFICATIONS, SEE COMMENT.    06/28/2017 Receptors her2    Estrogen Receptor: 100%, POSITIVE, STRONG STAINING INTENSITY Progesterone Receptor: 20%, POSITIVE, STRONG STAINING INTENSITY Proliferation Marker Ki67: 25% HER2: Postive     06/30/2017 Initial Diagnosis    Malignant neoplasm of upper-outer quadrant of left breast in female, estrogen receptor positive ([place])    07/13/2017 Surgery      Left mastectomy with SLN biopsy performed by Dr Marlou Starks.     07/13/2017 Pathology Results    Diagnosis  1. Breast, simple mastectomy, Left - INVASIVE AND IN SITU DUCTAL CARCINOMA, 4 CM, MSBR GRADE 3. - MARGINS NOT INVOLVED. - SEE ONCOLOGY TABLE. 2. Lymph node, sentinel, biopsy, Left #1 - ONE BENIGN LYMPH NODE (0/1). 3. Lymph node, biopsy, Left #2 - ONE BENIGN LYMPH NODE (0/1).    07/18/2017 - 07/27/2017 Hospital Admission    Admit date: 07/18/17 Admission diagnosis: spontaneous PNX Additional comments: the patient was admitted with a pneumothorax several days after a port was placed although her post procedure cxr was normal. She had a chest tube placed but had a persistent air leak. The pleurivac was placed to 40 cm h2o suction for several days and the pneumothorax finally resolved.     08/15/2017 Imaging    CT A/P IMPRESSION: 1. No CT findings to suggest metastatic disease involving the abdomen/pelvis. 2. Left chest wall fluid collection as discussed above. 3. Small right pleural effusion with minimal overlying atelectasis. 4. Cholelithiasis. 5. Small periumbilical abdominal wall hernia containing fat.     08/15/2017 Imaging    NM Whole Body Bone Scan FINDINGS: Uptake at the LEFT lateral aspects of the cervical and lower thoracic spine, typically degenerative.  Uptake at the shoulders, elbows, wrists, hips, and knees, typically degenerative.  Asymmetric uptake at the sternoclavicular joints RIGHT greater than LEFT which may also be degenerative.  No definite foci of abnormal osseous tracer accumulation are identified which are suspicious for osseous metastases.  Biconvex thoracolumbar scoliosis.  Expected urinary tract and soft tissue distribution of tracer.  IMPRESSION: Scattered likely degenerative type  uptake as above.  No definite scintigraphic evidence of osseous metastatic disease.    08/16/2017 Genetic Testing    The patient had genetic testing  due to a personal and family history of breast cancer.  The [employer] Multi-Cancer Panel was ordered. The Multi-Cancer Panel offered by [employer] includes sequencing and/or deletion duplication testing of the following 83 genes: ALK, APC, ATM, AXIN2,BAP1,  BARD1, BLM, BMPR1A, BRCA1, BRCA2, BRIP1, CASR, CDC73, CDH1, CDK4, CDKN1B, CDKN1C, CDKN2A (p14ARF), CDKN2A (p16INK4a), CEBPA, CHEK2, CTNNA1, DICER1, DIS3L2, EGFR (c.2369C>T, p.Thr790Met variant only), EPCAM (Deletion/duplication testing only), FH, FLCN, GATA2, GPC3, GREM1 (Promoter region deletion/duplication testing only), HOXB13 (c.251G>A, p.Gly84Glu), HRAS, KIT, MAX, MEN1, MET, MITF (c.952G>A, p.Glu318Lys variant only), MLH1, MSH2, MSH3, MSH6, MUTYH, NBN, NF1, NF2, NTHL1, PALB2, PDGFRA, PHOX2B, PMS2, POLD1, POLE, POT1, PRKAR1A, PTCH1, PTEN, RAD50, RAD51C, RAD51D, RB1, RECQL4, RET, RUNX1, SDHAF2, SDHA (sequence changes only), SDHB, SDHC, SDHD, SMAD4, SMARCA4, SMARCB1, SMARCE1, STK11, SUFU, TERC, TERT, TMEM127, TP53, TSC1, TSC2, VHL, WRN and WT1.   Results: Negative, no pathogenic variants identified in the genes analyzed.  The date of this test report is 08/16/2017.       08/26/2017 - 03/24/2018 Chemotherapy    adjuvant TCH with Onpro q3weeks for 4 cycles ending on 11/04/17 followed herceptin maintenance therapy every 3 weeks starting 12/28/17 and comepleted after 10 cycles on 03/24/18    08/31/2017 Breast US    On physical exam,there is a firm smooth mass in the low left axilla spanning at least 5 cm.  Ultrasound is performed, showing a complicated fluid collection which is predominantly anechoic measuring 5.2 x 3.9 x 3.5 cm. No blood flow was identified within the soft tissue component of the mass on color Doppler imaging. The appearance is most consistent with a postoperative seroma/resolving hematoma.  IMPRESSION: The complex fluid collection in the left axilla is very likely to represent a postoperative seroma/ resolving  hematoma.  RECOMMENDATION: Three-month follow-up left axillary ultrasound is recommended.     03/13/2018 -  Anti-estrogen oral therapy    Anastrozole daily    05/11/2018 Procedure    PAC removal by Dr. Lynelle Doctor on 05/11/18    05/18/2018 Survivorship    Survivorship Clinic with NP Jamelle Haring on 05/18/18    05/18/2018 Survivorship    Survivorship Clinic with NP Jamelle Haring on 05/18/18      CURRENT THERAPY:  Adjuvant Anastrozole daily starting 03/13/18  INTERVAL HISTORY:  Kathleen Hanson is here for a follow up of left breast cancer. She was last seen by me 4 months ago. She presents to the clinic today alone.She is doing well. She denies pain or discomfort. She has a mild  pain in seroma at mastectomy sight but it is manageable and is getting smaller.She has lymphedema in left arm and when she self massages she feels soreness in the right bicep. She went to a podiatrist and had a small toe removal. Pt currently has a hearing aide but is seeking other doctors for second opinions. She is healing well from surgery.     REVIEW OF SYSTEMS:   Constitutional: Denies fevers, chills or abnormal weight loss Eyes: Denies blurriness of vision Ears, nose, mouth, throat, and face: Denies mucositis or sore throat Respiratory: Denies cough, dyspnea or wheezes Cardiovascular: Denies palpitation, chest discomfort or lower extremity swelling Gastrointestinal:  Denies nausea, heartburn or change in bowel habits Skin: Denies abnormal skin rashes Lymphatics: Denies new lymphadenopathy or easy bruising Neurological:Denies numbness, tingling or new weaknesses Behavioral/Psych: Mood is stable, no new changes  Breast: (+) pain from left chest wall seroma, (+) lymphaden in left arm with swelling All other systems were reviewed with the patient and are negative.  MEDICAL HISTORY:  Past Medical History:  Diagnosis Date  . Cancer of left breast ([place])   . Constipation   . Family history of breast  cancer   . Fibrocystic breast   . Hyperlipidemia   . Ocular migraine    "used to have them monthly for a period of time; seemed to stop; now have had a couple in the last week" (07/13/2017)    SURGICAL HISTORY: Past Surgical History:  Procedure Laterality Date  . BREAST BIOPSY Right ~ 2001  . BREAST EXCISIONAL BIOPSY Right   . BREAST LUMPECTOMY Left 2001   chemo and radiation  . CESAREAN SECTION  X 1  . IR CATHETER TUBE CHANGE  07/20/2017  . MASTECTOMY COMPLETE / SIMPLE W/ SENTINEL NODE BIOPSY Left 07/13/2017    LEFT MASTECTOMY WITH SENTINEL NODE MAPPING ERAS PATHWAY [name] 07/13/2017  . MASTECTOMY W/ SENTINEL NODE BIOPSY Left 07/13/2017   Procedure: LEFT MASTECTOMY WITH SENTINEL NODE MAPPING ERAS PATHWAY;  Surgeon: [name], MD;  Location: [facility];  Service: General;  Laterality: Left;  . PORT-A-CATH REMOVAL N/A 05/11/2018   Procedure: REMOVAL PORT-A-CATH;  Surgeon: Stark Klein, MD;  Location: [place];  Service: General;  Laterality: N/A;  . PORTACATH PLACEMENT Right 07/13/2017  . PORTACATH PLACEMENT Right 07/13/2017   Procedure: INSERTION PORT-A-CATH;  Surgeon: [name], MD;  Location: [facility];  Service: General;  Laterality: Right;  . TONSILLECTOMY      I have reviewed the social history and family history with the patient and they are unchanged from previous note.  ALLERGIES:  has No Known Allergies.  MEDICATIONS:  Current Outpatient Medications  Medication Sig Dispense Refill  . anastrozole (ARIMIDEX) 1 MG tablet Take 1 tablet (1 mg total) by mouth daily. 90 tablet 1  . calcium citrate (CALCITRATE - DOSED IN MG ELEMENTAL CALCIUM) 950 MG tablet Take 200 mg of elemental calcium by mouth daily.    . Cholecalciferol (VITAMIN D3) 2000 units TABS Take by mouth.    . Multiple Vitamin (MULTIVITAMIN WITH MINERALS) TABS tablet Take 1 tablet by mouth daily.    . polyethylene glycol powder (GLYCOLAX/MIRALAX) powder MIX 17 GRAMS IN LIQUID AND DRINK DAILY IN THE  EVENING    . simvastatin (ZOCOR) 40 MG tablet Take 40 mg daily by mouth.    . vitamin C (ASCORBIC ACID) 500 MG tablet Take 500 mg by mouth daily.     No current facility-administered medications for this visit.     PHYSICAL EXAMINATION: ECOG PERFORMANCE STATUS: 1 - Symptomatic but completely ambulatory  Vitals:   11/17/18 1403 11/17/18 1406  BP: (!) 163/58 (!) 161/61  Pulse: 61   Resp: 18   Temp: 98.3 F (36.8 C)   SpO2: 99%    Filed Weights   11/17/18 1403  Weight: 137 lb 8 oz (62.4 kg)    GENERAL:alert, no distress and comfortable SKIN: skin color, texture, turgor are normal, no rashes or significant lesions EYES: normal, Conjunctiva are pink and non-injected, sclera clear OROPHARYNX:no exudate, no erythema and lips, buccal mucosa, and tongue normal  NECK: supple, thyroid normal size, non-tender, without nodularity LYMPH:  no palpable lymphadenopathy in the cervical, axillary or inguinal LUNGS: clear to auscultation and percussion with normal breathing effort HEART: regular rate & rhythm and no murmurs and no lower extremity edema ABDOMEN:abdomen soft,  non-tender and normal bowel sounds Musculoskeletal:no cyanosis of digits and no clubbing , (+) swelling in arm  NEURO: alert & oriented x 3 with fluent speech, no focal motor/sensory deficits BREAST: s/p left mastectomy no palpable mass, (+) soft tissue fullness below tissue of mastectomy, no palpable mass or adenopathy. (+) lymphedema in left arm, right breast exam normal   LABORATORY DATA:  I have reviewed the data as listed CBC Latest Ref Rng & Units 11/17/2018 07/21/2018 05/11/2018  WBC 4.0 - 10.5 K/uL 4.2 4.1 -  Hemoglobin 12.0 - 15.0 g/dL 13.4 14.1 13.6  Hematocrit 36.0 - 46.0 % 40.3 41.6 40.0  Platelets 150 - 400 K/uL 216 228 -     CMP Latest Ref Rng & Units 11/17/2018 07/21/2018 05/11/2018  Glucose 70 - 99 mg/dL 98 108(H) 97  BUN 8 - 23 mg/dL '13 12 11  ' Creatinine 0.44 - 1.00 mg/dL 0.71 0.62 0.50  Sodium 135 - 145  mmol/L 139 139 138  Potassium 3.5 - 5.1 mmol/L 4.1 4.3 4.8  Chloride 98 - 111 mmol/L 102 101 98  CO2 22 - 32 mmol/L 29 28 -  Calcium 8.9 - 10.3 mg/dL 8.9 9.4 -  Total Protein 6.5 - 8.1 g/dL 6.8 7.1 -  Total Bilirubin 0.3 - 1.2 mg/dL 0.4 0.5 -  Alkaline Phos 38 - 126 U/L 91 94 -  AST 15 - 41 U/L 17 20 -  ALT 0 - 44 U/L 18 18 -      RADIOGRAPHIC STUDIES: I have personally reviewed the radiological images as listed and agreed with the findings in the report. No results found.   ASSESSMENT & PLAN:  MANMEET ARZOLA is a 83 y.o. female with   1. Malignant neoplasm of upper-outer quadrant of left breast in female, invasive ductal carcinoma, pT2N0M0, Stage: 1A, triple positive, Grade 3 -She was diagnosed in 06/2017. She is s/p left mastectomy, adjuvant chemo TCH and maintenance Herceptin.  -Given she had radiation for her prior breast cancer she will forego adjuvant radiation -She declined oral her2 antibody Neratinib due to concerns of side effects.  -She started ani-estrogen therapy with Anastrozole in 02/2018. She is tolerating well overall, will continue for 5 years  -She is clinically doing well, lab reviewed, exam was unremarkable, except mild soft tissue fullness at the mastectomy site, no clinical concern for cancer recurrence. -Continue breast cancer surveillance, she is due for next mammogram in August 2020. -Follow-up in 6 months   2. Bilateral lower extremity edema, L>R, cellulitis in 11/2017 -This has much improved, she has trace edema on her lower extremities  3.  Left arm lymphedema -Negative previous breast surgery, she had physical therapy after surgery, and wears compression sleeves  -She is interested in manual massage for her lymphedema, I encouraged her to contact her physical therapist.  4. History of invasive and in situ ductal carcinoma of left breast, 10/26/1999, Estrogen receptor negative, stage 1, Grade 3 -Treated in [place]  -s/p Lumpectomy (sentinel  node negative, 2-3 removed), chemo, radiation  5. Osteopenia -10/11/2017 DEXA had lowest T-score of -0.9, which has improved from prior scans.  -Conitnue to take calcium and vitamin D supplement   PLAN: -Continue anastrozole -Right mammogram in August 2020 -Lab and follow-up in 6 months  No problem-specific Assessment & Plan notes found for this encounter.   Orders Placed This Encounter  Procedures  . MM DIAG BREAST TOMO UNI RIGHT    [facility]//EPIC ORDER PF @ [facility] 06/21/18//HX BREAST CAN//ANNUAL/NO NEEDS/SB W/ARIEL @ [facility]  OFF PROTOCOL    Standing Status:   Future    Standing Expiration Date:   11/18/2019    Order Specific Question:   Reason for Exam (SYMPTOM  OR DIAGNOSIS REQUIRED)    Answer:   screening    Order Specific Question:   Preferred imaging location?    Answer:   [facility]   All questions were answered. The patient knows to call the clinic with any problems, questions or concerns. No barriers to learning was detected. I spent 20 minutes counseling the patient face to face. The total time spent in the appointment was 25 minutes and more than 50% was on counseling and review of test results     [name], MD 11/17/2018   I Manson Allan, am acting as scribe for [name], MD.   I have reviewed the above documentation for accuracy and completeness, and I agree with the above.

## 2018-11-17 NOTE — Telephone Encounter (Signed)
Gave patient appointment for MM in 08/20. No los

## 2018-11-18 LAB — VITAMIN D 25 HYDROXY (VIT D DEFICIENCY, FRACTURES): Vit D, 25-Hydroxy: 34.9 ng/mL (ref 30.0–100.0)

## 2018-11-21 ENCOUNTER — Other Ambulatory Visit: Payer: Self-pay | Admitting: Hematology

## 2018-11-21 ENCOUNTER — Telehealth: Payer: Self-pay | Admitting: *Deleted

## 2018-11-21 DIAGNOSIS — C50412 Malignant neoplasm of upper-outer quadrant of left female breast: Secondary | ICD-10-CM

## 2018-11-21 DIAGNOSIS — Z170 Estrogen receptor positive status [ER+]: Principal | ICD-10-CM

## 2018-11-21 NOTE — Telephone Encounter (Signed)
Done.   Dashana Guizar MD  

## 2018-11-21 NOTE — Telephone Encounter (Signed)
Notified of message below

## 2018-11-21 NOTE — Telephone Encounter (Signed)
Mre Guinn left a message stating she needs another referral to the OP PT center. She sees Maudry Diego, PT for lymphedema.

## 2018-11-24 ENCOUNTER — Encounter: Payer: Self-pay | Admitting: Physical Therapy

## 2018-11-24 ENCOUNTER — Ambulatory Visit: Payer: Medicare HMO | Attending: Family Medicine | Admitting: Physical Therapy

## 2018-11-24 ENCOUNTER — Other Ambulatory Visit: Payer: Self-pay

## 2018-11-24 DIAGNOSIS — M62838 Other muscle spasm: Secondary | ICD-10-CM | POA: Diagnosis present

## 2018-11-24 DIAGNOSIS — I972 Postmastectomy lymphedema syndrome: Secondary | ICD-10-CM | POA: Diagnosis not present

## 2018-11-24 NOTE — Therapy (Signed)
[place], [place], [address] Phone: [phone]   Fax:  [phone]  Physical Therapy Evaluation  Patient Details  Name: Kathleen Hanson MRN: [id number] Date of Birth: [birth year] Referring Provider (PT): Burr Medico   Encounter Date: 11/24/2018    Past Medical History:  Diagnosis Date  . Cancer of left breast ([place])   . Constipation   . Family history of breast cancer   . Fibrocystic breast   . Hyperlipidemia   . Ocular migraine    "used to have them monthly for a period of time; seemed to stop; now have had a couple in the last week" (07/13/2017)    Past Surgical History:  Procedure Laterality Date  . BREAST BIOPSY Right ~ 2001  . BREAST EXCISIONAL BIOPSY Right   . BREAST LUMPECTOMY Left 2001   chemo and radiation  . CESAREAN SECTION  X 1  . IR CATHETER TUBE CHANGE  07/20/2017  . MASTECTOMY COMPLETE / SIMPLE W/ SENTINEL NODE BIOPSY Left 07/13/2017    LEFT MASTECTOMY WITH SENTINEL NODE MAPPING ERAS PATHWAY [name] 07/13/2017  . MASTECTOMY W/ SENTINEL NODE BIOPSY Left 07/13/2017   Procedure: LEFT MASTECTOMY WITH SENTINEL NODE MAPPING ERAS PATHWAY;  Surgeon: [name], MD;  Location: [facility];  Service: General;  Laterality: Left;  . PORT-A-CATH REMOVAL N/A 05/11/2018   Procedure: REMOVAL PORT-A-CATH;  Surgeon: Stark Klein, MD;  Location: [place];  Service: General;  Laterality: N/A;  . PORTACATH PLACEMENT Right 07/13/2017  . PORTACATH PLACEMENT Right 07/13/2017   Procedure: INSERTION PORT-A-CATH;  Surgeon: [name], MD;  Location: [place];  Service: General;  Laterality: Right;  . TONSILLECTOMY      There were no vitals filed for this visit.   Subjective Assessment - 11/24/18 0949    Subjective  I just wanted to come and have a physical therapist show me how to do the massage.     Pertinent History  Lt lumpectomy 2001 with chemo and radiation and then mastectomy 2018 with  SLNB 0/2 with chemotherapy again.  Now just on anastrozole.  high cholesterol, developed acute shoulder pain     Patient Stated Goals  to know how to do the lymphedema massage correctly    Currently in Pain?  No/denies    Pain Score  0-No pain         [facility] PT Assessment - 11/24/18 0001      Assessment   Medical Diagnosis  Lt mastectomy/UE lymphedema right shoulder pain     Referring Provider (PT)  Burr Medico    Onset Date/Surgical Date  07/13/17        LYMPHEDEMA/ONCOLOGY QUESTIONNAIRE - 11/24/18 0955      Type   Cancer Type  s/p left mastectomy SLNB      Surgeries   Mastectomy Date  07/13/17    Sentinel Lymph Node Biopsy Date  07/13/17    Number Lymph Nodes Removed  2      Treatment   Past Chemotherapy Treatment  No    Active Radiation Treatment  No    Past Radiation Treatment  Yes    Date  10/26/99    Body Site  left breast    Current Hormone Treatment  Yes    Drug Name  anastrazole      What other symptoms do you have   Are you Having Heaviness or Tightness  No    Are you having Pain  Yes    Are  you having pitting edema  No    Is it Hard or Difficult finding clothes that fit  No    Do you have infections  No    [place]emmer Sign  No      Lymphedema Assessments   Lymphedema Assessments  Upper extremities      Right Upper Extremity Lymphedema   15 cm Proximal to Olecranon Process  27.7 cm    10 cm Proximal to Olecranon Process  26.3 cm    Olecranon Process  23 cm    15 cm Proximal to Ulnar Styloid Process  20 cm    10 cm Proximal to Ulnar Styloid Process  17.4 cm    Just Proximal to Ulnar Styloid Process  14.1 cm    Across Hand at [employer]  18.1 cm    At [place] of 2nd Digit  6.3 cm      Left Upper Extremity Lymphedema   15 cm Proximal to Olecranon Process  27 cm    10 cm Proximal to Olecranon Process  26.3 cm    Olecranon Process  26.2 cm    15 cm Proximal to Ulnar Styloid Process  23.1 cm    10 cm Proximal to Ulnar Styloid Process  18.9 cm    Just Proximal  to Ulnar Styloid Process  14.4 cm    Across Hand at [employer]  18 cm    At [place] of 2nd Digit  6.1 cm    Other  --             Outpatient Rehab [facility]24/[facility]  Lymphedema Life Impact Scale Total Score  2.94 %      Objective measurements completed on examination: See above findings.      [place] [facility] PT Treatment/Exercise - 11/24/18 0001      Manual Therapy   Manual Lymphatic Drainage (MLD)  began reviewing self MLD by having pt demonstrate technique to therapist. Pt required cueing for speed and number of repetitions but overall required minimal cueing for correct sequence                  PT Long Term Goals - 11/24/18 1024      PT LONG TERM GOAL #1   Title  Pt will be independent in self MLD technique for maximal reduction of lymphedema    Time  4    Period  Weeks    Status  New    Target Date  12/22/18      PT LONG TERM GOAL #2   Title  Pt will demonstrate reduction of areas of tightness along lateral border of scapula for improved comfort    Time  4    Period  Weeks    Status  New    Target Date  12/22/18             Plan - 11/24/18 1020    Clinical Impression Statement  Pt presents to PT to learn how to do the self massage for lymphedema again. She feels she has been trying to do it at home but is not sure if she is doing it correctly and wants to make sure it is efficient. CIrcumferential measurements taken today demonstrate that she has been managing her lymphedema well with her compression sleeve. Her upper forearm and elbow are 3 cm larger than the right side. She does have some muslce tightness around lateral border of scapula which she notices when  donning her sleeve. She would benefit from skilled PT services to instruct her in self MLD and decrease tightness at lateral scapula.     History and Personal Factors relevant to plan of care:  hx of radiation    Clinical Presentation  Stable     Clinical Decision Making  Low    Rehab Potential  Good    Clinical Impairments Affecting Rehab Potential  Hx previous breast cancer on same side    PT Frequency  1x / week    PT Duration  4 weeks    PT Treatment/Interventions  ADLs/Self Care Home Management;Patient/family education;Therapeutic exercise;Passive range of motion;Manual lymph drainage;Compression bandaging;Manual techniques;Taping    PT Next Visit Plan  instruct pt in self MLD and assess pt for indep with this, massage to muscles around left lateral border of scapula and serratus in area of tightness    Consulted and Agree with Plan of Care  Patient       Patient will benefit from skilled therapeutic intervention in order to improve the following deficits and impairments:  Decreased knowledge of precautions, Increased edema, Increased muscle spasms  Visit Diagnosis: Postmastectomy lymphedema  Other muscle spasm     Problem List Patient Active Problem List   Diagnosis Date Noted  . Lower extremity cellulitis 12/07/2017  . Genetic testing 08/22/2017  . Family history of breast cancer   . Pneumothorax on right 07/18/2017  . Malignant neoplasm of upper-outer quadrant of left breast in female, estrogen receptor positive ([place]) 06/30/2017    [facility] 11/24/2018, 10:31 AM  [place], [place], [address] Phone: [phone]   Fax:  [phone]  Name: Kathleen Hanson MRN: [id number] Date of Birth: 1935/12/27  Manus Gunning, PT 11/24/18 10:31 AM

## 2018-11-27 ENCOUNTER — Telehealth: Payer: Self-pay

## 2018-11-27 NOTE — Telephone Encounter (Signed)
-----   Message from [name], MD sent at 11/22/2018 11:07 PM EST ----- Please let pt know her vitD level normal, but on low side, continue vitD supplement, thanks  [name]  11/22/2018

## 2018-11-27 NOTE — Telephone Encounter (Signed)
Spoke with patient per Dr. Burr Medico with lab results, Vitamin D was normal but on the low end of normal. Patient states she had switched to a calcium supplement with Vitamin D and thinks the amount of vitamin D she has been getting is less.  She plans to continue her Calcium and take Vitamin D3 1,000 units daily.  Patient verbalized an understanding.

## 2018-11-29 ENCOUNTER — Telehealth: Payer: Self-pay

## 2018-11-29 NOTE — Telephone Encounter (Signed)
Patient called to let Dr. Burr Medico know that her dermatologist removed a mole sent for biopsy and it is melanoma.  Forwarded to Dr. Burr Medico.

## 2018-11-30 ENCOUNTER — Telehealth: Payer: Self-pay | Admitting: Hematology

## 2018-11-30 ENCOUNTER — Other Ambulatory Visit: Payer: Self-pay | Admitting: Physician Assistant

## 2018-11-30 NOTE — Telephone Encounter (Signed)
Left message - scheduled appt per 2/5 sch message -

## 2018-12-01 ENCOUNTER — Ambulatory Visit: Payer: Medicare HMO | Admitting: Physical Therapy

## 2018-12-08 ENCOUNTER — Encounter: Payer: Self-pay | Admitting: Physical Therapy

## 2018-12-08 ENCOUNTER — Other Ambulatory Visit: Payer: Self-pay

## 2018-12-08 ENCOUNTER — Ambulatory Visit: Payer: Medicare HMO | Attending: Family Medicine | Admitting: Physical Therapy

## 2018-12-08 DIAGNOSIS — I972 Postmastectomy lymphedema syndrome: Secondary | ICD-10-CM | POA: Insufficient documentation

## 2018-12-08 NOTE — Therapy (Signed)
[place], [place], [address] Phone: [phone]   Fax:  [phone]  Physical Therapy Treatment  Patient Details  Name: Kathleen Hanson MRN: [id number] Date of Birth: 07/24/1936 Referring Provider (PT): Burr Medico   Encounter Date: 12/08/2018  PT End of Session - 12/08/18 1154    Visit Number  2    Number of Visits  5    Date for PT Re-Evaluation  12/22/18    PT Start Time  1115    PT Stop Time  1150    PT Time Calculation (min)  35 min    Activity Tolerance  Patient tolerated treatment well    Behavior During Therapy  [facility] for tasks assessed/performed       Past Medical History:  Diagnosis Date  . Cancer of left breast ([place])   . Constipation   . Family history of breast cancer   . Fibrocystic breast   . Hyperlipidemia   . Ocular migraine    "used to have them monthly for a period of time; seemed to stop; now have had a couple in the last week" (07/13/2017)    Past Surgical History:  Procedure Laterality Date  . BREAST BIOPSY Right ~ 2001  . BREAST EXCISIONAL BIOPSY Right   . BREAST LUMPECTOMY Left 2001   chemo and radiation  . CESAREAN SECTION  X 1  . IR CATHETER TUBE CHANGE  07/20/2017  . MASTECTOMY COMPLETE / SIMPLE W/ SENTINEL NODE BIOPSY Left 07/13/2017    LEFT MASTECTOMY WITH SENTINEL NODE MAPPING ERAS PATHWAY [name] 07/13/2017  . MASTECTOMY W/ SENTINEL NODE BIOPSY Left 07/13/2017   Procedure: LEFT MASTECTOMY WITH SENTINEL NODE MAPPING ERAS PATHWAY;  Surgeon: [name], MD;  Location: [facility];  Service: General;  Laterality: Left;  . PORT-A-CATH REMOVAL N/A 05/11/2018   Procedure: REMOVAL PORT-A-CATH;  Surgeon: Stark Klein, MD;  Location: [facility];  Service: General;  Laterality: N/A;  . PORTACATH PLACEMENT Right 07/13/2017  . PORTACATH PLACEMENT Right 07/13/2017   Procedure: INSERTION PORT-A-CATH;  Surgeon: [name], MD;  Location: [facility];  Service: General;   Laterality: Right;  . TONSILLECTOMY      There were no vitals filed for this visit.  Subjective Assessment - 12/08/18 1116    Subjective  I did not wear my sleeve today since we were going to massaging.     Pertinent History  Lt lumpectomy 2001 with chemo and radiation and then mastectomy 2018 with SLNB 0/2 with chemotherapy again.  Now just on anastrozole.  high cholesterol, developed acute shoulder pain     Patient Stated Goals  to know how to do the lymphedema massage correctly    Currently in Pain?  No/denies    Pain Score  0-No pain                  Outpatient Rehab from 11/24/2018 in [facility]  Lymphedema Life Impact Scale Total Score  2.94 %           [facility] Adult PT Treatment/Exercise - 12/08/18 0001      Manual Therapy   Manual Lymphatic Drainage (MLD)  Reviewed self manual lymph drainage per handout with having pt perform each step with v/c from therapist, therapist added notes to pt's handout to help pt remember key points at home                  PT Long Term Goals - 12/08/18  1200      PT LONG TERM GOAL #1   Title  Pt will be independent in self MLD technique for maximal reduction of lymphedema    Baseline  12/08/18- pt reports she is now independent with this    Time  4    Status  Achieved      PT LONG TERM GOAL #2   Title  Pt will demonstrate reduction of areas of tightness along lateral border of scapula for improved comfort    Baseline  12/08/18- pt reports she is not having any discomfort in this area now    Time  4    Period  Weeks    Status  Achieved            Plan - 12/08/18 1157    Clinical Impression Statement  Pt brought her handout for self MLD today. Had pt demonstrate technique and seqeunce while providing verbal cues. Also made notes on pt's current handout to help her remember certain things at home such as doing 1 pump per second. She has a tendency to do the massage too quickly.  Pt also had been using a paint roller and massage glove to do the massage at home. Assessed effectiveness of these and educated pt that the hand is the best and has been researched. Pt states she is now ready for discharge from skilled PT services at this time because she feels independent now.     Rehab Potential  Good    Clinical Impairments Affecting Rehab Potential  Hx previous breast cancer on same side    PT Frequency  1x / week    PT Duration  4 weeks    PT Treatment/Interventions  ADLs/Self Care Home Management;Patient/family education;Therapeutic exercise;Passive range of motion;Manual lymph drainage;Compression bandaging;Manual techniques;Taping    PT Next Visit Plan  d/c this visit    Consulted and Agree with Plan of Care  Patient       Patient will benefit from skilled therapeutic intervention in order to improve the following deficits and impairments:  Decreased knowledge of precautions, Increased edema, Increased muscle spasms  Visit Diagnosis: Postmastectomy lymphedema     Problem List Patient Active Problem List   Diagnosis Date Noted  . Lower extremity cellulitis 12/07/2017  . Genetic testing 08/22/2017  . Family history of breast cancer   . Pneumothorax on right 07/18/2017  . Malignant neoplasm of upper-outer quadrant of left breast in female, estrogen receptor positive ([place]) 06/30/2017    Allyson Sabal [facility] 12/08/2018, 12:01 PM  [place], [place], [address] Phone: [phone]   Fax:  [phone]  Name: Kathleen Hanson MRN: [id number] Date of Birth: Mar 22, 1936  PHYSICAL THERAPY DISCHARGE SUMMARY  Visits from Start of Care: 2  Current functional level related to goals / functional outcomes: All goals met   Remaining deficits: None   Education / Equipment: Self MLD  Plan: Patient agrees to discharge.  Patient goals were met. Patient is being discharged due to  meeting the stated rehab goals.  ?????     Allyson Sabal [place], [place] 12/08/18 12:01 PM

## 2018-12-15 ENCOUNTER — Encounter: Payer: Medicare HMO | Admitting: Physical Therapy

## 2018-12-22 ENCOUNTER — Encounter: Payer: Medicare HMO | Admitting: Physical Therapy

## 2019-02-23 ENCOUNTER — Encounter: Payer: Self-pay | Admitting: General Practice

## 2019-02-23 NOTE — Progress Notes (Signed)
[facility] Support Services   [facility] Support Services Team contacted patient to assess for food insecurity and other psychosocial needs during current COVID19 pandemic.    Patient/family expressed no needs at this time.  Support Team member encouraged patient to call if changes occur or they have any other questions/concerns.    Anne C Cunningham, LCSW  [facility]        

## 2019-05-07 ENCOUNTER — Other Ambulatory Visit: Payer: Self-pay

## 2019-05-07 DIAGNOSIS — Z170 Estrogen receptor positive status [ER+]: Secondary | ICD-10-CM

## 2019-05-07 DIAGNOSIS — C50412 Malignant neoplasm of upper-outer quadrant of left female breast: Secondary | ICD-10-CM

## 2019-05-07 MED ORDER — ANASTROZOLE 1 MG PO TABS: 1.0000 mg | ORAL_TABLET | Freq: Every day | ORAL | 1 refills | Status: DC

## 2019-05-28 NOTE — Progress Notes (Signed)
[place]   Telephone:([phone] Fax:([phone]   Clinic Follow up Note   Patient Care Team: Bernerd Limbo, MD as PCP - General (Family Medicine) [name], MD as Consulting Physician (General Surgery) [name], MD as Consulting Physician (Hematology) [name], MD as Consulting Physician (Radiation Oncology) Gardenia Phlegm, NP as Nurse Practitioner (Hematology and Oncology)  Date of Service:  05/31/2019  CHIEF COMPLAINT: F/u of left breast cancer   SUMMARY OF ONCOLOGIC HISTORY: Oncology History Overview Note  Cancer Staging Malignant neoplasm of upper-outer quadrant of left breast in female, estrogen receptor positive ([place]) Staging form: Breast, AJCC 8th Edition - Clinical stage from 07/06/2017: Stage IA (cT1c, cN0, cM0, G3, ER: Positive, PR: Positive, HER2: Positive) - Unsigned Staging comments: Staged at breast conference 9.12.18 - Pathologic stage from 07/13/2017: Stage IA (pT2, pN0, cM0, G3, ER: Positive, PR: Positive, HER2: Positive) - Signed by [name], MD on 08/16/2017     Malignant neoplasm of upper-outer quadrant of left breast in female, estrogen receptor positive ([place])  06/23/2017 Mammogram   Diagnostic mammogram 06/23/17 IMPRESSION: Indeterminate mass with irregular margins at the 2:30 position 4 cm from nipple measuring 1.4 x 0.8 x 1.2 cm in the upper-outer left breast.   06/28/2017 Initial Biopsy   Diagnosis 06/28/17 Breast, left, needle core biopsy, upper outer quadrant, 2:30 o'clock, 4cm from nipple - INVASIVE MAMMARY CARCINOMA WITH CALCIFICATIONS, SEE COMMENT.   06/28/2017 Receptors her2   Estrogen Receptor: 100%, POSITIVE, STRONG STAINING INTENSITY Progesterone Receptor: 20%, POSITIVE, STRONG STAINING INTENSITY Proliferation Marker Ki67: 25% HER2: Postive    06/30/2017 Initial Diagnosis   Malignant neoplasm of upper-outer quadrant of left breast in female, estrogen receptor positive ([place])   07/13/2017 Surgery   Left  mastectomy with SLN biopsy performed by Dr Marlou Starks.    07/13/2017 Pathology Results   Diagnosis  1. Breast, simple mastectomy, Left - INVASIVE AND IN SITU DUCTAL CARCINOMA, 4 CM, MSBR GRADE 3. - MARGINS NOT INVOLVED. - SEE ONCOLOGY TABLE. 2. Lymph node, sentinel, biopsy, Left #1 - ONE BENIGN LYMPH NODE (0/1). 3. Lymph node, biopsy, Left #2 - ONE BENIGN LYMPH NODE (0/1).   07/18/2017 - 07/27/2017 Hospital Admission   Admit date: 07/18/17 Admission diagnosis: spontaneous PNX Additional comments: the patient was admitted with a pneumothorax several days after a port was placed although her post procedure cxr was normal. She had a chest tube placed but had a persistent air leak. The pleurivac was placed to 40 cm h2o suction for several days and the pneumothorax finally resolved.    08/15/2017 Imaging   CT A/P IMPRESSION: 1. No CT findings to suggest metastatic disease involving the abdomen/pelvis. 2. Left chest wall fluid collection as discussed above. 3. Small right pleural effusion with minimal overlying atelectasis. 4. Cholelithiasis. 5. Small periumbilical abdominal wall hernia containing fat.    08/15/2017 Imaging   NM Whole Body Bone Scan FINDINGS: Uptake at the LEFT lateral aspects of the cervical and lower thoracic spine, typically degenerative.  Uptake at the shoulders, elbows, wrists, hips, and knees, typically degenerative.  Asymmetric uptake at the sternoclavicular joints RIGHT greater than LEFT which may also be degenerative.  No definite foci of abnormal osseous tracer accumulation are identified which are suspicious for osseous metastases.  Biconvex thoracolumbar scoliosis.  Expected urinary tract and soft tissue distribution of tracer.  IMPRESSION: Scattered likely degenerative type uptake as above.  No definite scintigraphic evidence of osseous metastatic disease.   08/16/2017 Genetic Testing   The patient  had genetic testing due to a personal and  family history of breast cancer.  The [employer] Multi-Cancer Panel was ordered. The Multi-Cancer Panel offered by [employer] includes sequencing and/or deletion duplication testing of the following 83 genes: ALK, APC, ATM, AXIN2,BAP1,  BARD1, BLM, BMPR1A, BRCA1, BRCA2, BRIP1, CASR, CDC73, CDH1, CDK4, CDKN1B, CDKN1C, CDKN2A (p14ARF), CDKN2A (p16INK4a), CEBPA, CHEK2, CTNNA1, DICER1, DIS3L2, EGFR (c.2369C>T, p.Thr790Met variant only), EPCAM (Deletion/duplication testing only), FH, FLCN, GATA2, GPC3, GREM1 (Promoter region deletion/duplication testing only), HOXB13 (c.251G>A, p.Gly84Glu), HRAS, KIT, MAX, MEN1, MET, MITF (c.952G>A, p.Glu318Lys variant only), MLH1, MSH2, MSH3, MSH6, MUTYH, NBN, NF1, NF2, NTHL1, PALB2, PDGFRA, PHOX2B, PMS2, POLD1, POLE, POT1, PRKAR1A, PTCH1, PTEN, RAD50, RAD51C, RAD51D, RB1, RECQL4, RET, RUNX1, SDHAF2, SDHA (sequence changes only), SDHB, SDHC, SDHD, SMAD4, SMARCA4, SMARCB1, SMARCE1, STK11, SUFU, TERC, TERT, TMEM127, TP53, TSC1, TSC2, VHL, WRN and WT1.   Results: Negative, no pathogenic variants identified in the genes analyzed.  The date of this test report is 08/16/2017.      08/26/2017 - 03/24/2018 Chemotherapy   adjuvant TCH with Onpro q3weeks for 4 cycles ending on 11/04/17 followed herceptin maintenance therapy every 3 weeks starting 12/28/17 and comepleted after 10 cycles on 03/24/18   08/31/2017 Breast US   On physical exam,there is a firm smooth mass in the low left axilla spanning at least 5 cm.  Ultrasound is performed, showing a complicated fluid collection which is predominantly anechoic measuring 5.2 x 3.9 x 3.5 cm. No blood flow was identified within the soft tissue component of the mass on color Doppler imaging. The appearance is most consistent with a postoperative seroma/resolving hematoma.  IMPRESSION: The complex fluid collection in the left axilla is very likely to represent a postoperative seroma/ resolving hematoma.  RECOMMENDATION: Three-month follow-up  left axillary ultrasound is recommended.    03/13/2018 -  Anti-estrogen oral therapy   Anastrozole daily   05/11/2018 Procedure   PAC removal by Dr. Lynelle Doctor on 05/11/18   05/18/2018 Survivorship   Survivorship Clinic with NP Jamelle Haring on 05/18/18   05/18/2018 Survivorship   Survivorship Clinic with NP Jamelle Haring on 05/18/18      CURRENT THERAPY:  Adjuvant Anastrozole daily starting 03/13/18  INTERVAL HISTORY:  Kathleen Hanson is here for a follow up left breast cancer. She was last seen by me 7 months ago. She presents to the clinic alone. She notes she is doing well. She is staying home most of the time. She notes she will get down or discouraged but has activities to do and read to keep her busy.  She is taking anastrozole with no issues. Her weight has been stable but she would like to loose some weight.  She notes her BP is elevated today. She has not checked her BP at home but has the equipment. She denies residual side effects from chemo.    REVIEW OF SYSTEMS:   Constitutional: Denies fevers, chills or abnormal weight loss Eyes: Denies blurriness of vision Ears, nose, mouth, throat, and face: Denies mucositis or sore throat Respiratory: Denies cough, dyspnea or wheezes Cardiovascular: Denies palpitation, chest discomfort or lower extremity swelling Gastrointestinal:  Denies nausea, heartburn or change in bowel habits Skin: Denies abnormal skin rashes Lymphatics: Denies new lymphadenopathy or easy bruising Neurological:Denies numbness, tingling or new weaknesses Behavioral/Psych: Mood is stable, no new changes  Breast: (+) left arm lymphedema  All other systems were reviewed with the patient and are negative.  MEDICAL HISTORY:  Past Medical History:  Diagnosis Date  . Cancer of  left breast ([place])   . Constipation   . Family history of breast cancer   . Fibrocystic breast   . Hyperlipidemia   . Ocular migraine    "used to have them monthly for a period of  time; seemed to stop; now have had a couple in the last week" (07/13/2017)    SURGICAL HISTORY: Past Surgical History:  Procedure Laterality Date  . BREAST BIOPSY Right ~ 2001  . BREAST EXCISIONAL BIOPSY Right   . BREAST LUMPECTOMY Left 2001   chemo and radiation  . CESAREAN SECTION  X 1  . IR CATHETER TUBE CHANGE  07/20/2017  . MASTECTOMY COMPLETE / SIMPLE W/ SENTINEL NODE BIOPSY Left 07/13/2017    LEFT MASTECTOMY WITH SENTINEL NODE MAPPING ERAS PATHWAY [name] 07/13/2017  . MASTECTOMY W/ SENTINEL NODE BIOPSY Left 07/13/2017   Procedure: LEFT MASTECTOMY WITH SENTINEL NODE MAPPING ERAS PATHWAY;  Surgeon: [name], MD;  Location: [facility];  Service: General;  Laterality: Left;  . PORT-A-CATH REMOVAL N/A 05/11/2018   Procedure: REMOVAL PORT-A-CATH;  Surgeon: Stark Klein, MD;  Location: [facility];  Service: General;  Laterality: N/A;  . PORTACATH PLACEMENT Right 07/13/2017  . PORTACATH PLACEMENT Right 07/13/2017   Procedure: INSERTION PORT-A-CATH;  Surgeon: [name], MD;  Location: [facility];  Service: General;  Laterality: Right;  . TONSILLECTOMY      I have reviewed the social history and family history with the patient and they are unchanged from previous note.  ALLERGIES:  has No Known Allergies.  MEDICATIONS:  Current Outpatient Medications  Medication Sig Dispense Refill  . anastrozole (ARIMIDEX) 1 MG tablet Take 1 tablet (1 mg total) by mouth daily. 90 tablet 1  . calcium citrate (CALCITRATE - DOSED IN MG ELEMENTAL CALCIUM) 950 MG tablet Take 200 mg of elemental calcium by mouth daily.    . Cholecalciferol (VITAMIN D3) 2000 units TABS Take by mouth.    . Multiple Vitamin (MULTIVITAMIN WITH MINERALS) TABS tablet Take 1 tablet by mouth daily.    . polyethylene glycol powder (GLYCOLAX/MIRALAX) powder MIX 17 GRAMS IN LIQUID AND DRINK DAILY IN THE EVENING    . simvastatin (ZOCOR) 40 MG tablet Take 40 mg daily by mouth.    . vitamin C (ASCORBIC ACID) 500 MG tablet  Take 500 mg by mouth daily.     No current facility-administered medications for this visit.     PHYSICAL EXAMINATION: ECOG PERFORMANCE STATUS: 0 - Asymptomatic  Vitals:   05/31/19 1532 05/31/19 1537  BP: (!) 172/68 (!) 174/64  Pulse: 68   Resp: 17   Temp: 97.8 F (36.6 C)   SpO2: 100%    Filed Weights   05/31/19 1532  Weight: 141 lb 9.6 oz (64.2 kg)    GENERAL:alert, no distress and comfortable SKIN: skin color, texture, turgor are normal, no rashes or significant lesions EYES: normal, Conjunctiva are pink and non-injected, sclera clear  NECK: supple, thyroid normal size, non-tender, without nodularity LYMPH:  no palpable lymphadenopathy in the cervical, axillary  LUNGS: clear to auscultation and percussion with normal breathing effort HEART: regular rate & rhythm and no murmurs and no lower extremity edema ABDOMEN:abdomen soft, non-tender and normal bowel sounds Musculoskeletal:no cyanosis of digits and no clubbing  NEURO: alert & oriented x 3 with fluent speech, no focal motor/sensory deficits BREAST: S/p left mastectomy: surgical incision healed well: Surgical incision healed well (+) Small seroma of left breast. No palpable mass, nodules or adenopathy bilaterally. Breast exam benign.  LABORATORY DATA:  I have reviewed the data as listed CBC Latest Ref Rng & Units 05/31/2019 11/17/2018 07/21/2018  WBC 4.0 - 10.5 K/uL 5.1 4.2 4.1  Hemoglobin 12.0 - 15.0 g/dL 13.8 13.4 14.1  Hematocrit 36.0 - 46.0 % 41.9 40.3 41.6  Platelets 150 - 400 K/uL 227 216 228     CMP Latest Ref Rng & Units 05/31/2019 11/17/2018 07/21/2018  Glucose 70 - 99 mg/dL 95 98 108(H)  BUN 8 - 23 mg/dL _0 Creatinine 0.44 - 1.00 mg/dL 0.64 0.71 0.62  Sodium 135 - 145 mmol/L 140 139 139  Potassium 3.5 - 5.1 mmol/L 4.0 4.1 4.3  Chloride 98 - 111 mmol/L 103 102 101  CO2 22 - 32 mmol/L _1 Calcium 8.9 - 10.3 mg/dL 9.0 8.9 9.4  Total Protein 6.5 - 8.1 g/dL 6.9 6.8 7.1  Total Bilirubin 0.3 - 1.2  mg/dL 0.4 0.4 0.5  Alkaline Phos 38 - 126 U/L 100 91 94  AST 15 - 41 U/L _2 ALT 0 - 44 U/L _3 RADIOGRAPHIC STUDIES: I have personally reviewed the radiological images as listed and agreed with the findings in the report. No results found.   ASSESSMENT & PLAN:  Kathleen Hanson is a 83 y.o. female with   1. Malignant neoplasm of upper-outer quadrant of left breast in female, invasive ductal carcinoma, pT2N0M0, Stage: 1A, triple positive, Grade 3 -She was diagnosed in 06/2017. She is s/p left mastectomy, adjuvant chemo TCH and maintenance Herceptin.  -Given she had radiation for her prior breast cancer she will foregoadjuvantradiation -She declined oral her2 antibody Neratinib due to concerns of side effects.  -She started ani-estrogen therapy with Anastrozole in 02/2018. She is tolerating well overall, will continue for 5 years.  -I encouraged her to watch her weight, cholesterol and A1c with healthy diet and exercise as Anastrozole can slow down her metabolism. I recommend 20-30 minutes of walking 3-4 times a week.  -She is clinically doing well. Lab reviewed, her CBC and CMP are within normal limits. Vitamin D pending. Her physical exam and her 05/31/19 mammogram were benign. There is no clinical concern for recurrence. -Continue surveillance. Next mammogram on 831/20.  -Continue anastrozole  -Virtual visit in 4 months. F/u in 8 months   2. Left arm lymphedema -Negative previous breast surgery, she had physical therapy after surgery, and wears compression sleeves  -She is interested in manual massage for her lymphedema, I encouraged her to contact her physical therapist.  3. History of invasive and in situ ductal carcinoma of left breast, 10/26/1999, Estrogen receptor negative, stage 1, Grade 3 -Treated in [place]  -s/p Lumpectomy (sentinel node negative, 2-3 removed), chemo, radiation  4. Bone health  -10/11/2017 DEXA normal with lowest T-score of -0.9,  which has improved from prior osteopenic scans  -Continue to take calcium and vitamin D supplement -Next DEXA in 09/2019  5. Elevated BP -Her BP has been elevated lately. No formal diagnosis of HTN  -BP at 174/64 today (05/31/19) -I instructed to monitor at home more often. If remains above 140/90 she should see her PCP  PLAN: -She is clinically doing well  -will copy my note and her labs to her PCP -Continue anastrozole -Right mammogram on 8/312020 -DEXA in 09/2019 -Virtual visit in 4 months  -Lab and f/u in 8 months    No problem-specific Assessment & Plan notes found for this encounter.   Orders  Placed This Encounter  Procedures  . DG Bone Density    Ins Pf-10/11/17    Standing Status:   Future    Standing Expiration Date:   05/30/2020    Order Specific Question:   Reason for Exam (SYMPTOM  OR DIAGNOSIS REQUIRED)    Answer:   screening    Order Specific Question:   Preferred imaging location?    Answer:   [facility]   All questions were answered. The patient knows to call the clinic with any problems, questions or concerns. No barriers to learning was detected. I spent 15 minutes counseling the patient face to face. The total time spent in the appointment was 20 minutes and more than 50% was on counseling and review of test results     [name], MD 05/31/2019   I, Joslyn Devon, am acting as scribe for [name], MD.   I have reviewed the above documentation for accuracy and completeness, and I agree with the above.

## 2019-05-31 ENCOUNTER — Inpatient Hospital Stay: Payer: Medicare HMO | Attending: Hematology | Admitting: Hematology

## 2019-05-31 ENCOUNTER — Encounter: Payer: Self-pay | Admitting: Hematology

## 2019-05-31 ENCOUNTER — Other Ambulatory Visit: Payer: Self-pay

## 2019-05-31 ENCOUNTER — Telehealth: Payer: Self-pay | Admitting: Hematology

## 2019-05-31 ENCOUNTER — Inpatient Hospital Stay: Payer: Medicare HMO

## 2019-05-31 VITALS — BP 174/64 | HR 68 | Temp 97.8°F | Resp 17 | Ht 64.0 in | Wt 141.6 lb

## 2019-05-31 DIAGNOSIS — Z853 Personal history of malignant neoplasm of breast: Secondary | ICD-10-CM | POA: Insufficient documentation

## 2019-05-31 DIAGNOSIS — Z79899 Other long term (current) drug therapy: Secondary | ICD-10-CM | POA: Diagnosis not present

## 2019-05-31 DIAGNOSIS — Z79811 Long term (current) use of aromatase inhibitors: Secondary | ICD-10-CM | POA: Insufficient documentation

## 2019-05-31 DIAGNOSIS — Z9012 Acquired absence of left breast and nipple: Secondary | ICD-10-CM | POA: Insufficient documentation

## 2019-05-31 DIAGNOSIS — C50412 Malignant neoplasm of upper-outer quadrant of left female breast: Secondary | ICD-10-CM

## 2019-05-31 DIAGNOSIS — Z170 Estrogen receptor positive status [ER+]: Secondary | ICD-10-CM

## 2019-05-31 DIAGNOSIS — M8580 Other specified disorders of bone density and structure, unspecified site: Secondary | ICD-10-CM | POA: Insufficient documentation

## 2019-05-31 DIAGNOSIS — I890 Lymphedema, not elsewhere classified: Secondary | ICD-10-CM | POA: Diagnosis not present

## 2019-05-31 DIAGNOSIS — Z803 Family history of malignant neoplasm of breast: Secondary | ICD-10-CM | POA: Diagnosis not present

## 2019-05-31 DIAGNOSIS — Z9221 Personal history of antineoplastic chemotherapy: Secondary | ICD-10-CM | POA: Insufficient documentation

## 2019-05-31 DIAGNOSIS — Z1321 Encounter for screening for nutritional disorder: Secondary | ICD-10-CM

## 2019-05-31 DIAGNOSIS — R030 Elevated blood-pressure reading, without diagnosis of hypertension: Secondary | ICD-10-CM | POA: Diagnosis not present

## 2019-05-31 LAB — COMPREHENSIVE METABOLIC PANEL
ALT: 21 U/L (ref 0–44)
AST: 22 U/L (ref 15–41)
Albumin: 4.1 g/dL (ref 3.5–5.0)
Alkaline Phosphatase: 100 U/L (ref 38–126)
Anion gap: 10 (ref 5–15)
BUN: 13 mg/dL (ref 8–23)
CO2: 27 mmol/L (ref 22–32)
Calcium: 9 mg/dL (ref 8.9–10.3)
Chloride: 103 mmol/L (ref 98–111)
Creatinine, Ser: 0.64 mg/dL (ref 0.44–1.00)
GFR calc Af Amer: >60 mL/min (ref >60)
GFR calc non Af Amer: >60 mL/min (ref >60)
Glucose, Bld: 95 mg/dL (ref 70–99)
Potassium: 4 mmol/L (ref 3.5–5.1)
Sodium: 140 mmol/L (ref 135–145)
Total Bilirubin: 0.4 mg/dL (ref 0.3–1.2)
Total Protein: 6.9 g/dL (ref 6.5–8.1)

## 2019-05-31 LAB — CBC WITH DIFFERENTIAL/PLATELET
Abs Immature Granulocytes: 0.02 10*3/uL (ref 0.00–0.07)
Basophils Absolute: 0 10*3/uL (ref 0.0–0.1)
Basophils Relative: 0 %
Eosinophils Absolute: 0.1 10*3/uL (ref 0.0–0.5)
Eosinophils Relative: 2 %
HCT: 41.9 % (ref 36.0–46.0)
Hemoglobin: 13.8 g/dL (ref 12.0–15.0)
Immature Granulocytes: 0 %
Lymphocytes Relative: 23 %
Lymphs Abs: 1.2 10*3/uL (ref 0.7–4.0)
MCH: 31.4 pg (ref 26.0–34.0)
MCHC: 32.9 g/dL (ref 30.0–36.0)
MCV: 95.2 fL (ref 80.0–100.0)
Monocytes Absolute: 0.4 10*3/uL (ref 0.1–1.0)
Monocytes Relative: 8 %
Neutro Abs: 3.4 10*3/uL (ref 1.7–7.7)
Neutrophils Relative %: 67 %
Platelets: 227 10*3/uL (ref 150–400)
RBC: 4.4 MIL/uL (ref 3.87–5.11)
RDW: 13 % (ref 11.5–15.5)
WBC: 5.1 10*3/uL (ref 4.0–10.5)
nRBC: 0 % (ref 0.0–0.2)

## 2019-05-31 NOTE — Telephone Encounter (Signed)
Scheduled appointments in December and April per 08/06 los, patient received avs and calender.

## 2019-06-01 LAB — VITAMIN D 25 HYDROXY (VIT D DEFICIENCY, FRACTURES): Vit D, 25-Hydroxy: 39.9 ng/mL (ref 30.0–100.0)

## 2019-06-13 ENCOUNTER — Other Ambulatory Visit: Payer: Self-pay | Admitting: Physician Assistant

## 2019-06-13 DIAGNOSIS — C4491 Basal cell carcinoma of skin, unspecified: Secondary | ICD-10-CM

## 2019-06-13 DIAGNOSIS — D229 Melanocytic nevi, unspecified: Secondary | ICD-10-CM

## 2019-06-13 HISTORY — DX: Basal cell carcinoma of skin, unspecified: C44.91

## 2019-06-13 HISTORY — DX: Melanocytic nevi, unspecified: D22.9

## 2019-06-25 ENCOUNTER — Ambulatory Visit
Admission: RE | Admit: 2019-06-25 | Discharge: 2019-06-25 | Disposition: A | Discharge status: Home / Self Care | Payer: Medicare HMO | Source: Ambulatory Visit | Attending: Hematology | Admitting: Hematology

## 2019-06-25 ENCOUNTER — Other Ambulatory Visit: Payer: Self-pay

## 2019-06-25 ENCOUNTER — Other Ambulatory Visit: Payer: Self-pay | Admitting: Hematology

## 2019-06-25 DIAGNOSIS — C50412 Malignant neoplasm of upper-outer quadrant of left female breast: Secondary | ICD-10-CM

## 2019-06-25 DIAGNOSIS — Z170 Estrogen receptor positive status [ER+]: Secondary | ICD-10-CM

## 2019-09-06 ENCOUNTER — Telehealth: Payer: Self-pay

## 2019-09-06 NOTE — Telephone Encounter (Signed)
Patient calls stating she was going through her files and ran across her visit summary from seeing Dr. Burr Medico in August.  It indicated that Dr. Burr Medico order DG DEXA to be done in December.  Advised patient that it had been scheduled for 10/29/2019 at 1:30 at [facility].  She verbalized an understanding and was appreciative of the information.

## 2019-09-28 ENCOUNTER — Telehealth: Payer: Self-pay | Admitting: Hematology

## 2019-09-28 NOTE — Telephone Encounter (Signed)
Contacted patient to verify webex visit for pre reg °

## 2019-09-28 NOTE — Progress Notes (Signed)
[name]   [phone] [phone]   Clinic Follow up Note   Patient Care Team: Bernerd Limbo, MD as PCP - General (Family Medicine) [name], MD as Consulting Physician (General Surgery) [name], MD as Consulting Physician (Hematology) [name], MD as Consulting Physician (Radiation Oncology) Gardenia Phlegm, NP as Nurse Practitioner (Hematology and Oncology)   Date of Service:  10/01/2019   CHIEF COMPLAINT: F/u of left breast cancer  SUMMARY OF ONCOLOGIC HISTORY: Oncology History Overview Note  Cancer Staging Malignant neoplasm of upper-outer quadrant of left breast in female, estrogen receptor positive ([place]) Staging form: Breast, AJCC 8th Edition - Clinical stage from 07/06/2017: Stage IA (cT1c, cN0, cM0, G3, ER: Positive, PR: Positive, HER2: Positive) - Unsigned Staging comments: Staged at breast conference 9.12.18 - Pathologic stage from 07/13/2017: Stage IA (pT2, pN0, cM0, G3, ER: Positive, PR: Positive, HER2: Positive) - Signed by [name], MD on 08/16/2017     Malignant neoplasm of upper-outer quadrant of left breast in female, estrogen receptor positive ([place])  06/23/2017 Mammogram   Diagnostic mammogram 06/23/17 IMPRESSION: Indeterminate mass with irregular margins at the 2:30 position 4 cm from nipple measuring 1.4 x 0.8 x 1.2 cm in the upper-outer left breast.   06/28/2017 Initial Biopsy   Diagnosis 06/28/17 Breast, left, needle core biopsy, upper outer quadrant, 2:30 o'clock, 4cm from nipple - INVASIVE MAMMARY CARCINOMA WITH CALCIFICATIONS, SEE COMMENT.   06/28/2017 Receptors her2   Estrogen Receptor: 100%, POSITIVE, STRONG STAINING INTENSITY Progesterone Receptor: 20%, POSITIVE, STRONG STAINING INTENSITY Proliferation Marker Ki67: 25% HER2: Postive    06/30/2017 Initial Diagnosis   Malignant neoplasm of upper-outer quadrant of left breast in female, estrogen receptor positive ([place])   07/13/2017 Surgery   Left mastectomy with SLN biopsy performed by Dr Marlou Starks.    07/13/2017 Pathology Results   Diagnosis  1. Breast, simple mastectomy, Left - INVASIVE AND IN SITU DUCTAL CARCINOMA, 4 CM, MSBR GRADE 3. - MARGINS NOT INVOLVED. - SEE ONCOLOGY TABLE. 2. Lymph node, sentinel, biopsy, Left #1 - ONE BENIGN LYMPH NODE (0/1). 3. Lymph node, biopsy, Left #2 - ONE BENIGN LYMPH NODE (0/1).   07/18/2017 - 07/27/2017 Hospital Admission   Admit date: 07/18/17 Admission diagnosis: spontaneous PNX Additional comments: the patient was admitted with a pneumothorax several days after a port was placed although her post procedure cxr was normal. She had a chest tube placed but had a persistent air leak. The pleurivac was placed to 40 cm h2o suction for several days and the pneumothorax finally resolved.    08/15/2017 Imaging   CT A/P IMPRESSION: 1. No CT findings to suggest metastatic disease involving the abdomen/pelvis. 2. Left chest wall fluid collection as discussed above. 3. Small right pleural effusion with minimal overlying atelectasis. 4. Cholelithiasis. 5. Small periumbilical abdominal wall hernia containing fat.    08/15/2017 Imaging   NM Whole Body Bone Scan FINDINGS: Uptake at the LEFT lateral aspects of the cervical and lower thoracic spine, typically degenerative.  Uptake at the shoulders, elbows, wrists, hips, and knees, typically degenerative.  Asymmetric uptake at the sternoclavicular joints RIGHT greater than LEFT which may also be degenerative.  No definite foci of abnormal osseous tracer accumulation are identified which are suspicious for osseous metastases.  Biconvex thoracolumbar scoliosis.  Expected urinary tract and soft tissue distribution of tracer.  IMPRESSION: Scattered likely degenerative type uptake as above.  No definite scintigraphic evidence of osseous metastatic disease.   08/16/2017 Genetic Testing   The patient  had genetic testing due to a  personal and family history of breast cancer.  The [employer] Multi-Cancer Panel was ordered. The Multi-Cancer Panel offered by [employer] includes sequencing and/or deletion duplication testing of the following 83 genes: ALK, APC, ATM, AXIN2,BAP1,  BARD1, BLM, BMPR1A, BRCA1, BRCA2, BRIP1, CASR, CDC73, CDH1, CDK4, CDKN1B, CDKN1C, CDKN2A (p14ARF), CDKN2A (p16INK4a), CEBPA, CHEK2, CTNNA1, DICER1, DIS3L2, EGFR (c.2369C>T, p.Thr790Met variant only), EPCAM (Deletion/duplication testing only), FH, FLCN, GATA2, GPC3, GREM1 (Promoter region deletion/duplication testing only), HOXB13 (c.251G>A, p.Gly84Glu), HRAS, KIT, MAX, MEN1, MET, MITF (c.952G>A, p.Glu318Lys variant only), MLH1, MSH2, MSH3, MSH6, MUTYH, NBN, NF1, NF2, NTHL1, PALB2, PDGFRA, PHOX2B, PMS2, POLD1, POLE, POT1, PRKAR1A, PTCH1, PTEN, RAD50, RAD51C, RAD51D, RB1, RECQL4, RET, RUNX1, SDHAF2, SDHA (sequence changes only), SDHB, SDHC, SDHD, SMAD4, SMARCA4, SMARCB1, SMARCE1, STK11, SUFU, TERC, TERT, TMEM127, TP53, TSC1, TSC2, VHL, WRN and WT1.   Results: Negative, no pathogenic variants identified in the genes analyzed.  The date of this test report is 08/16/2017.      08/26/2017 - 03/24/2018 Chemotherapy   adjuvant TCH with Onpro q3weeks for 4 cycles ending on 11/04/17 followed herceptin maintenance therapy every 3 weeks starting 12/28/17 and comepleted after 10 cycles on 03/24/18   08/31/2017 Breast US   On physical exam,there is a firm smooth mass in the low left axilla spanning at least 5 cm.  Ultrasound is performed, showing a complicated fluid collection which is predominantly anechoic measuring 5.2 x 3.9 x 3.5 cm. No blood flow was identified within the soft tissue component of the mass on color Doppler imaging. The appearance is most consistent with a postoperative seroma/resolving hematoma.  IMPRESSION: The complex fluid collection in the left axilla is very likely to represent a postoperative seroma/ resolving hematoma.  RECOMMENDATION:  Three-month follow-up left axillary ultrasound is recommended.    03/13/2018 -  Anti-estrogen oral therapy   Anastrozole daily   05/11/2018 Procedure   PAC removal by Dr. Lynelle Doctor on 05/11/18   05/18/2018 Survivorship   Survivorship Clinic with NP Jamelle Haring on 05/18/18   05/18/2018 Survivorship   Survivorship Clinic with NP Jamelle Haring on 05/18/18      CURRENT THERAPY:  Adjuvant Anastrozole daily starting 03/13/18  INTERVAL HISTORY:  Kathleen Hanson is here for a follow up of left breast cancer. She presents to the clinic alone. She did not know about her virtual visit today. She notes she is hard of hearing today. She notes she is doing well at home. She notes still having left arm lymphedema. She will do massages for this and has flares of left bicep soreness from doing so. She saw her Orthopedist for this who indicated her pain may be related to her rotator cuff and gave her cortisone shot. This has is now very minimal. She notes she tries to be active with walking but wants to do more walking.  She notes she continues to tolerate anastrozole well. She denies hot flashes or joint stiffness.     REVIEW OF SYSTEMS:   Constitutional: Denies fevers, chills or abnormal weight loss Eyes: Denies blurriness of vision Ears, nose, mouth, throat, and face: Denies mucositis or sore throat Respiratory: Denies cough, dyspnea or wheezes Cardiovascular: Denies palpitation, chest discomfort or lower extremity swelling Gastrointestinal:  Denies nausea, heartburn or change in bowel habits Skin: Denies abnormal skin rashes Lymphatics: Denies new lymphadenopathy or easy bruising (+) Left arm lymphedema  Neurological:Denies numbness, tingling or new weaknesses Behavioral/Psych: Mood is stable, no new changes  All other systems were reviewed with the  patient and are negative.  MEDICAL HISTORY:  Past Medical History:  Diagnosis Date  . Cancer of left breast ([place])   . Constipation   .  Family history of breast cancer   . Fibrocystic breast   . Hyperlipidemia   . Ocular migraine    "used to have them monthly for a period of time; seemed to stop; now have had a couple in the last week" (07/13/2017)    SURGICAL HISTORY: Past Surgical History:  Procedure Laterality Date  . BREAST BIOPSY Right ~ 2001  . BREAST EXCISIONAL BIOPSY Right   . BREAST LUMPECTOMY Left 2001   chemo and radiation  . CESAREAN SECTION  X 1  . IR CATHETER TUBE CHANGE  07/20/2017  . MASTECTOMY COMPLETE / SIMPLE W/ SENTINEL NODE BIOPSY Left 07/13/2017    LEFT MASTECTOMY WITH SENTINEL NODE MAPPING ERAS PATHWAY [name] 07/13/2017  . MASTECTOMY W/ SENTINEL NODE BIOPSY Left 07/13/2017   Procedure: LEFT MASTECTOMY WITH SENTINEL NODE MAPPING ERAS PATHWAY;  Surgeon: [name], MD;  Location: [facility];  Service: General;  Laterality: Left;  . PORT-A-CATH REMOVAL N/A 05/11/2018   Procedure: REMOVAL PORT-A-CATH;  Surgeon: Stark Klein, MD;  Location: [facility];  Service: General;  Laterality: N/A;  . PORTACATH PLACEMENT Right 07/13/2017  . PORTACATH PLACEMENT Right 07/13/2017   Procedure: INSERTION PORT-A-CATH;  Surgeon: [name], MD;  Location: [facility];  Service: General;  Laterality: Right;  . TONSILLECTOMY      I have reviewed the social history and family history with the patient and they are unchanged from previous note.  ALLERGIES:  has No Known Allergies.  MEDICATIONS:  Current Outpatient Medications  Medication Sig Dispense Refill  . anastrozole (ARIMIDEX) 1 MG tablet Take 1 tablet (1 mg total) by mouth daily. 90 tablet 1  . calcium citrate (CALCITRATE - DOSED IN MG ELEMENTAL CALCIUM) 950 MG tablet Take 200 mg of elemental calcium by mouth daily.    . Cholecalciferol (VITAMIN D3) 2000 units TABS Take by mouth.    . Multiple Vitamin (MULTIVITAMIN WITH MINERALS) TABS tablet Take 1 tablet by mouth daily.    . polyethylene glycol powder (GLYCOLAX/MIRALAX) powder MIX 17 GRAMS IN LIQUID  AND DRINK DAILY IN THE EVENING    . simvastatin (ZOCOR) 40 MG tablet Take 40 mg daily by mouth.    . vitamin C (ASCORBIC ACID) 500 MG tablet Take 500 mg by mouth daily.     No current facility-administered medications for this visit.     PHYSICAL EXAMINATION: ECOG PERFORMANCE STATUS: 0 - Asymptomatic  Vitals:   10/01/19 1024  BP: (!) 150/64  Pulse: 64  Resp: 17  Temp: 98.2 F (36.8 C)  SpO2: 99%   Filed Weights   10/01/19 1024  Weight: 137 lb 11.2 oz (62.5 kg)    GENERAL:alert, no distress and comfortable SKIN: skin color, texture, turgor are normal, no rashes or significant lesions EYES: normal, Conjunctiva are pink and non-injected, sclera clear  NECK: supple, thyroid normal size, non-tender, without nodularity LYMPH:  no palpable lymphadenopathy in the cervical, axillary  LUNGS: clear to auscultation and percussion with normal breathing effort HEART: regular rate & rhythm and no murmurs and no lower extremity edema (+) Left lower leg slightly larger than right  ABDOMEN:abdomen soft, non-tender and normal bowel sounds Musculoskeletal:no cyanosis of digits and no clubbing  NEURO: alert & oriented x 3 with fluent speech, no focal motor/sensory deficits BREAST: s/p left mastectomy (breast surgically absent): Surgical incision healed  well. (+) Very mild Left forearm lymphedema. No palpable mass, nodules or adenopathy bilaterally. Breast exam benign.    LABORATORY DATA:  I have reviewed the data as listed CBC Latest Ref Rng & Units 05/31/2019 11/17/2018 07/21/2018  WBC 4.0 - 10.5 K/uL 5.1 4.2 4.1  Hemoglobin 12.0 - 15.0 g/dL 13.8 13.4 14.1  Hematocrit 36.0 - 46.0 % 41.9 40.3 41.6  Platelets 150 - 400 K/uL 227 216 228     CMP Latest Ref Rng & Units 05/31/2019 11/17/2018 07/21/2018  Glucose 70 - 99 mg/dL 95 98 108(H)  BUN 8 - 23 mg/dL _0 Creatinine 0.44 - 1.00 mg/dL 0.64 0.71 0.62  Sodium 135 - 145 mmol/L 140 139 139  Potassium 3.5 - 5.1 mmol/L 4.0 4.1 4.3  Chloride 98  - 111 mmol/L 103 102 101  CO2 22 - 32 mmol/L _1 Calcium 8.9 - 10.3 mg/dL 9.0 8.9 9.4  Total Protein 6.5 - 8.1 g/dL 6.9 6.8 7.1  Total Bilirubin 0.3 - 1.2 mg/dL 0.4 0.4 0.5  Alkaline Phos 38 - 126 U/L 100 91 94  AST 15 - 41 U/L _2 ALT 0 - 44 U/L _3 RADIOGRAPHIC STUDIES: I have personally reviewed the radiological images as listed and agreed with the findings in the report. No results found.   ASSESSMENT & PLAN:  Kathleen Hanson is a 83 y.o. female with   1. Malignant neoplasm of upper-outer quadrant of left breast in female, invasive ductal carcinoma, pT2N0M0, Stage: 1A, triple positive, Grade 3 -She was diagnosed in 06/2017. She is s/p left mastectomy, adjuvant chemo TCH and maintenance Herceptin.  -Given she had radiation for her prior breast cancer she will foregoadjuvantradiation -She declined oral her2 antibody Neratinib due toconcerns ofside effects.  -She started ani-estrogen therapy with Anastrozole in 02/2018.She is tolerating well overall, will continue for 5-10 years.  -She is clinically doing well. She recently had left leg basal skin cancer removed in 06/13/19. She will continue to f/u with her Dermatologist. Her physical exam and her 06/2019 mammogram were unremarkable. There is no clinical concern for recurrence. -lab CBC, CMP was WNL 4 months ago, will not repeat today  -Continue surveillance. Next mammogram in 06/2020 -Continue anastrozole  -F/u in 6 months  -She has had her flu shot and shingles shot this year.   2.Left arm lymphedema  -Negative previous breast surgery, she had physical therapy after surgery, and wears compressionsleeves. Mostly resolved now, remaining in left forearm.  -Has lead to right shoulder/bicep pain due to self massages. Has been minimal now after shoulder cortisone shot from orthopedist.  -I encouraged her to exercise her left side more.   3. History of invasive and in situ ductal carcinoma of left breast,  10/26/1999, Estrogen receptor negative, stage 1, Grade 3 -Treated in [place]  -s/p Lumpectomy (sentinel node negative, 2-3 removed), chemo, radiation  4.Bone health -10/11/2017 DEXAnormal withlowest T-score of -0.9, which has improved from priorosteopenicscans  -Continueto take calcium and vitamin D supplement -Next DEXA set for 10/29/19   PLAN: -She is clinically doing well  -Continue anastrozole -DEXA in 09/2019 -Lab and F/u in 6 months    No problem-specific Assessment & Plan notes found for this encounter.   No orders of the defined types were placed in this encounter.  All questions were answered. The patient knows to call the clinic with any problems, questions or concerns. No barriers to learning was detected.  I spent 15 minutes counseling the patient face to face. The total time spent in the appointment was 20 minutes and more than 50% was on counseling and review of test results    [name], MD 10/01/2019   I, Joslyn Devon, am acting as scribe for [name], MD.   I have reviewed the above documentation for accuracy and completeness, and I agree with the above.

## 2019-10-01 ENCOUNTER — Telehealth: Payer: Self-pay | Admitting: Hematology

## 2019-10-01 ENCOUNTER — Encounter: Payer: Self-pay | Admitting: Hematology

## 2019-10-01 ENCOUNTER — Inpatient Hospital Stay: Payer: Medicare HMO | Attending: Hematology | Admitting: Hematology

## 2019-10-01 ENCOUNTER — Other Ambulatory Visit: Payer: Self-pay

## 2019-10-01 VITALS — BP 150/64 | HR 64 | Temp 98.2°F | Resp 17 | Ht 64.0 in | Wt 137.7 lb

## 2019-10-01 DIAGNOSIS — Z79811 Long term (current) use of aromatase inhibitors: Secondary | ICD-10-CM | POA: Insufficient documentation

## 2019-10-01 DIAGNOSIS — Z85828 Personal history of other malignant neoplasm of skin: Secondary | ICD-10-CM | POA: Insufficient documentation

## 2019-10-01 DIAGNOSIS — C50412 Malignant neoplasm of upper-outer quadrant of left female breast: Secondary | ICD-10-CM | POA: Diagnosis not present

## 2019-10-01 DIAGNOSIS — I890 Lymphedema, not elsewhere classified: Secondary | ICD-10-CM | POA: Diagnosis not present

## 2019-10-01 DIAGNOSIS — M25511 Pain in right shoulder: Secondary | ICD-10-CM | POA: Diagnosis not present

## 2019-10-01 DIAGNOSIS — Z923 Personal history of irradiation: Secondary | ICD-10-CM | POA: Diagnosis not present

## 2019-10-01 DIAGNOSIS — Z79899 Other long term (current) drug therapy: Secondary | ICD-10-CM | POA: Insufficient documentation

## 2019-10-01 DIAGNOSIS — Z853 Personal history of malignant neoplasm of breast: Secondary | ICD-10-CM | POA: Diagnosis not present

## 2019-10-01 DIAGNOSIS — Z170 Estrogen receptor positive status [ER+]: Secondary | ICD-10-CM | POA: Insufficient documentation

## 2019-10-01 DIAGNOSIS — Z803 Family history of malignant neoplasm of breast: Secondary | ICD-10-CM | POA: Insufficient documentation

## 2019-10-01 DIAGNOSIS — E785 Hyperlipidemia, unspecified: Secondary | ICD-10-CM | POA: Insufficient documentation

## 2019-10-01 NOTE — Telephone Encounter (Signed)
Scheduled appt per 12/7 los. ° °Spoke with pt and she is aware of the appt date and time. °

## 2019-10-29 ENCOUNTER — Other Ambulatory Visit: Payer: Medicare HMO

## 2019-10-30 ENCOUNTER — Other Ambulatory Visit: Payer: Self-pay

## 2019-10-30 ENCOUNTER — Ambulatory Visit
Admission: RE | Admit: 2019-10-30 | Discharge: 2019-10-30 | Disposition: A | Discharge status: Home / Self Care | Payer: Medicare HMO | Source: Ambulatory Visit | Attending: Hematology | Admitting: Hematology

## 2019-10-30 DIAGNOSIS — C50412 Malignant neoplasm of upper-outer quadrant of left female breast: Secondary | ICD-10-CM

## 2019-11-06 ENCOUNTER — Telehealth: Payer: Self-pay | Admitting: Nurse Practitioner

## 2019-11-06 NOTE — Telephone Encounter (Signed)
-----   Message from [name], MD sent at 10/30/2019 11:39 AM EST ----- Regan Rakers, could you give her a call and discuss her DEXA result, she has osteopenia before, last scan 2 years ago was normal, now she has osteopenia again, with high risk of hip fracture. She is on anastrozole, probably related. Please discuss oral biphosphatase vs zometa every 6 months X4 (preferred due to benefit of reduced bone mets in breast cancer survivors). You can bill a phone visit if appropriate.   Thanks much,  Krista Blue

## 2019-11-06 NOTE — Telephone Encounter (Signed)
Scheduled appt per 11/12 sch message - pt is aware of appt date and time.  

## 2019-11-06 NOTE — Telephone Encounter (Signed)
I called Ms. Po to review recent DEXA which shows recurrent osteopenia with high FRAX score, likely related to AI. She was on fosamax previously and tolerated well. I am recommending to restart bisphosphonate therapy, fosamax vs IV zometa which has additional benefit of reduced bone mets in breast cancer patients. I reviewed potential side effects including GI side effects, hypocalcemia, arthrlagias, renal dysfunction, and rare but serious osteonecrosis of the jaw. She has implants but reports good oral health/hygeine. She agrees to proceed with zometa. She is being scheduled for lab and f/u prior to infusion. She verbalizes understanding of the above and appreciates the call.  Cira Rue, NP  11/06/2019

## 2019-11-15 ENCOUNTER — Other Ambulatory Visit: Payer: Self-pay

## 2019-11-15 ENCOUNTER — Inpatient Hospital Stay: Payer: Medicare HMO | Attending: Hematology | Admitting: Nurse Practitioner

## 2019-11-15 ENCOUNTER — Inpatient Hospital Stay: Payer: Medicare HMO

## 2019-11-15 ENCOUNTER — Encounter: Payer: Self-pay | Admitting: Nurse Practitioner

## 2019-11-15 VITALS — BP 170/65 | HR 60 | Temp 98.5°F | Resp 18 | Ht 64.0 in | Wt 141.4 lb

## 2019-11-15 DIAGNOSIS — M85852 Other specified disorders of bone density and structure, left thigh: Secondary | ICD-10-CM

## 2019-11-15 DIAGNOSIS — C50412 Malignant neoplasm of upper-outer quadrant of left female breast: Secondary | ICD-10-CM | POA: Diagnosis not present

## 2019-11-15 DIAGNOSIS — Z79899 Other long term (current) drug therapy: Secondary | ICD-10-CM

## 2019-11-15 DIAGNOSIS — I890 Lymphedema, not elsewhere classified: Secondary | ICD-10-CM

## 2019-11-15 DIAGNOSIS — Z170 Estrogen receptor positive status [ER+]: Secondary | ICD-10-CM | POA: Diagnosis not present

## 2019-11-15 DIAGNOSIS — E785 Hyperlipidemia, unspecified: Secondary | ICD-10-CM

## 2019-11-15 DIAGNOSIS — Z79811 Long term (current) use of aromatase inhibitors: Secondary | ICD-10-CM

## 2019-11-15 LAB — COMPREHENSIVE METABOLIC PANEL
ALT: 17 U/L (ref 0–44)
AST: 18 U/L (ref 15–41)
Albumin: 4.1 g/dL (ref 3.5–5.0)
Alkaline Phosphatase: 97 U/L (ref 38–126)
Anion gap: 9 (ref 5–15)
BUN: 15 mg/dL (ref 8–23)
CO2: 28 mmol/L (ref 22–32)
Calcium: 8.9 mg/dL (ref 8.9–10.3)
Chloride: 105 mmol/L (ref 98–111)
Creatinine, Ser: 0.59 mg/dL (ref 0.44–1.00)
GFR calc Af Amer: >60 mL/min (ref >60)
GFR calc non Af Amer: >60 mL/min (ref >60)
Glucose, Bld: 92 mg/dL (ref 70–99)
Potassium: 4.6 mmol/L (ref 3.5–5.1)
Sodium: 142 mmol/L (ref 135–145)
Total Bilirubin: 0.4 mg/dL (ref 0.3–1.2)
Total Protein: 6.9 g/dL (ref 6.5–8.1)

## 2019-11-15 LAB — CBC WITH DIFFERENTIAL/PLATELET
Abs Immature Granulocytes: 0.01 10*3/uL (ref 0.00–0.07)
Basophils Absolute: 0 10*3/uL (ref 0.0–0.1)
Basophils Relative: 0 %
Eosinophils Absolute: 0.1 10*3/uL (ref 0.0–0.5)
Eosinophils Relative: 2 %
HCT: 42.6 % (ref 36.0–46.0)
Hemoglobin: 14.3 g/dL (ref 12.0–15.0)
Immature Granulocytes: 0 %
Lymphocytes Relative: 21 %
Lymphs Abs: 1 10*3/uL (ref 0.7–4.0)
MCH: 31.8 pg (ref 26.0–34.0)
MCHC: 33.6 g/dL (ref 30.0–36.0)
MCV: 94.9 fL (ref 80.0–100.0)
Monocytes Absolute: 0.3 10*3/uL (ref 0.1–1.0)
Monocytes Relative: 7 %
Neutro Abs: 3.1 10*3/uL (ref 1.7–7.7)
Neutrophils Relative %: 70 %
Platelets: 208 10*3/uL (ref 150–400)
RBC: 4.49 MIL/uL (ref 3.87–5.11)
RDW: 13.3 % (ref 11.5–15.5)
WBC: 4.5 10*3/uL (ref 4.0–10.5)
nRBC: 0 % (ref 0.0–0.2)

## 2019-11-15 MED ORDER — ALENDRONATE SODIUM 70 MG PO TABS: 70.0000 mg | ORAL_TABLET | ORAL | 0 refills | Status: DC

## 2019-11-15 NOTE — Progress Notes (Signed)
[place]   [phone] [phone]   Clinic Follow up Note   Patient Care Team: Bernerd Limbo, MD as PCP - General (Family Medicine) [name], MD as Consulting Physician (General Surgery) [name], MD as Consulting Physician (Hematology) [name], MD as Consulting Physician (Radiation Oncology) Gardenia Phlegm, NP as Nurse Practitioner (Hematology and Oncology) 11/15/2019  I connected with Raynald Blend on 11/15/19 at 11:45 AM EST by video enabled telemedicine visit and verified that I am speaking with the correct person using two identifiers.   I discussed the limitations, risks, security and privacy concerns of performing an evaluation and management service by telemedicine and the availability of in-person appointments. I also discussed with the patient that there may be a patient responsible charge related to this service. The patient expressed understanding and agreed to proceed.   Other persons participating in the visit and their role in the encounter: none   Patient's location: [place] exam room  Provider's location: home  CHIEF COMPLAINT: F/u left breast exam, to start bisphosphonate   SUMMARY OF ONCOLOGIC HISTORY: Oncology History Overview Note  Cancer Staging Malignant neoplasm of upper-outer quadrant of left breast in female, estrogen receptor positive ([place]) Staging form: Breast, AJCC 8th Edition - Clinical stage from 07/06/2017: Stage IA (cT1c, cN0, cM0, G3, ER: Positive, PR: Positive, HER2: Positive) - Unsigned Staging comments: Staged at breast conference 9.12.18 - Pathologic stage from 07/13/2017: Stage IA (pT2, pN0, cM0, G3, ER: Positive, PR: Positive, HER2: Positive) - Signed by [name], MD on 08/16/2017     Malignant neoplasm of upper-outer quadrant of left breast in female, estrogen receptor positive ([place])  06/23/2017 Mammogram   Diagnostic mammogram 06/23/17 IMPRESSION: Indeterminate mass with  irregular margins at the 2:30 position 4 cm from nipple measuring 1.4 x 0.8 x 1.2 cm in the upper-outer left breast.   06/28/2017 Initial Biopsy   Diagnosis 06/28/17 Breast, left, needle core biopsy, upper outer quadrant, 2:30 o'clock, 4cm from nipple - INVASIVE MAMMARY CARCINOMA WITH CALCIFICATIONS, SEE COMMENT.   06/28/2017 Receptors her2   Estrogen Receptor: 100%, POSITIVE, STRONG STAINING INTENSITY Progesterone Receptor: 20%, POSITIVE, STRONG STAINING INTENSITY Proliferation Marker Ki67: 25% HER2: Postive    06/30/2017 Initial Diagnosis   Malignant neoplasm of upper-outer quadrant of left breast in female, estrogen receptor positive ([place])   07/13/2017 Surgery   Left mastectomy with SLN biopsy performed by Dr Marlou Starks.    07/13/2017 Pathology Results   Diagnosis  1. Breast, simple mastectomy, Left - INVASIVE AND IN SITU DUCTAL CARCINOMA, 4 CM, MSBR GRADE 3. - MARGINS NOT INVOLVED. - SEE ONCOLOGY TABLE. 2. Lymph node, sentinel, biopsy, Left #1 - ONE BENIGN LYMPH NODE (0/1). 3. Lymph node, biopsy, Left #2 - ONE BENIGN LYMPH NODE (0/1).   07/18/2017 - 07/27/2017 Hospital Admission   Admit date: 07/18/17 Admission diagnosis: spontaneous PNX Additional comments: the patient was admitted with a pneumothorax several days after a port was placed although her post procedure cxr was normal. She had a chest tube placed but had a persistent air leak. The pleurivac was placed to 40 cm h2o suction for several days and the pneumothorax finally resolved.    08/15/2017 Imaging   CT A/P IMPRESSION: 1. No CT findings to suggest metastatic disease involving the abdomen/pelvis. 2. Left chest wall fluid collection as discussed above. 3. Small right pleural effusion with minimal overlying atelectasis. 4. Cholelithiasis. 5. Small periumbilical abdominal wall hernia containing fat.    08/15/2017 Imaging   NM  Whole Body Bone Scan FINDINGS: Uptake at the LEFT lateral aspects of the cervical and  lower thoracic spine, typically degenerative.  Uptake at the shoulders, elbows, wrists, hips, and knees, typically degenerative.  Asymmetric uptake at the sternoclavicular joints RIGHT greater than LEFT which may also be degenerative.  No definite foci of abnormal osseous tracer accumulation are identified which are suspicious for osseous metastases.  Biconvex thoracolumbar scoliosis.  Expected urinary tract and soft tissue distribution of tracer.  IMPRESSION: Scattered likely degenerative type uptake as above.  No definite scintigraphic evidence of osseous metastatic disease.   08/16/2017 Genetic Testing   The patient had genetic testing due to a personal and family history of breast cancer.  The [employer] Multi-Cancer Panel was ordered. The Multi-Cancer Panel offered by [facility] includes sequencing and/or deletion duplication testing of the following 83 genes: ALK, APC, ATM, AXIN2,BAP1,  BARD1, BLM, BMPR1A, BRCA1, BRCA2, BRIP1, CASR, CDC73, CDH1, CDK4, CDKN1B, CDKN1C, CDKN2A (p14ARF), CDKN2A (p16INK4a), CEBPA, CHEK2, CTNNA1, DICER1, DIS3L2, EGFR (c.2369C>T, p.Thr790Met variant only), EPCAM (Deletion/duplication testing only), FH, FLCN, GATA2, GPC3, GREM1 (Promoter region deletion/duplication testing only), HOXB13 (c.251G>A, p.Gly84Glu), HRAS, KIT, MAX, MEN1, MET, MITF (c.952G>A, p.Glu318Lys variant only), MLH1, MSH2, MSH3, MSH6, MUTYH, NBN, NF1, NF2, NTHL1, PALB2, PDGFRA, PHOX2B, PMS2, POLD1, POLE, POT1, PRKAR1A, PTCH1, PTEN, RAD50, RAD51C, RAD51D, RB1, RECQL4, RET, RUNX1, SDHAF2, SDHA (sequence changes only), SDHB, SDHC, SDHD, SMAD4, SMARCA4, SMARCB1, SMARCE1, STK11, SUFU, TERC, TERT, TMEM127, TP53, TSC1, TSC2, VHL, WRN and WT1.   Results: Negative, no pathogenic variants identified in the genes analyzed.  The date of this test report is 08/16/2017.      08/26/2017 - 03/24/2018 Chemotherapy   adjuvant TCH with Onpro q3weeks for 4 cycles ending on 11/04/17 followed herceptin  maintenance therapy every 3 weeks starting 12/28/17 and comepleted after 10 cycles on 03/24/18   08/31/2017 Breast US   On physical exam,there is a firm smooth mass in the low left axilla spanning at least 5 cm.  Ultrasound is performed, showing a complicated fluid collection which is predominantly anechoic measuring 5.2 x 3.9 x 3.5 cm. No blood flow was identified within the soft tissue component of the mass on color Doppler imaging. The appearance is most consistent with a postoperative seroma/resolving hematoma.  IMPRESSION: The complex fluid collection in the left axilla is very likely to represent a postoperative seroma/ resolving hematoma.  RECOMMENDATION: Three-month follow-up left axillary ultrasound is recommended.    03/13/2018 -  Anti-estrogen oral therapy   Anastrozole daily   05/11/2018 Procedure   PAC removal by Dr. Lynelle Doctor on 05/11/18   05/18/2018 Survivorship   Survivorship Clinic with NP Jamelle Haring on 05/18/18   05/18/2018 Survivorship   Survivorship Clinic with NP Jamelle Haring on 05/18/18     CURRENT THERAPY: Adjuvant anastrozole   INTERVAL HISTORY: Ms. Brownstein presents for f/u as scheduled. We previously discussed beginning zometa for osteopenia and high fracture risk and she agreed. However today she thought more and prefers to restart fosamax which she tolerated in the past. She otherwise feels well. She continues anastrozole. Denies new bone/joint pain, hot flashes. She feels a lump on the left side that feels like touching bone. She first noticed this 6 months ago, has not changed in any way. Mild lymphedema on left arm is stable. Appetite and energy are normal for her. She manages constipation, in the process of changing from miralax to metamucil. Denies any bleeding. No recent fever, chills, cough, chest pain, dyspnea. Leg edema resolved, legs same size now.  MEDICAL HISTORY:  Past Medical History:  Diagnosis Date  . Cancer of left breast ([place])    . Constipation   . Family history of breast cancer   . Fibrocystic breast   . Hyperlipidemia   . Ocular migraine    "used to have them monthly for a period of time; seemed to stop; now have had a couple in the last week" (07/13/2017)    SURGICAL HISTORY: Past Surgical History:  Procedure Laterality Date  . BREAST BIOPSY Right ~ 2001  . BREAST EXCISIONAL BIOPSY Right   . BREAST LUMPECTOMY Left 2001   chemo and radiation  . CESAREAN SECTION  X 1  . IR CATHETER TUBE CHANGE  07/20/2017  . MASTECTOMY COMPLETE / SIMPLE W/ SENTINEL NODE BIOPSY Left 07/13/2017    LEFT MASTECTOMY WITH SENTINEL NODE MAPPING ERAS PATHWAY [name] 07/13/2017  . MASTECTOMY W/ SENTINEL NODE BIOPSY Left 07/13/2017   Procedure: LEFT MASTECTOMY WITH SENTINEL NODE MAPPING ERAS PATHWAY;  Surgeon: [name], MD;  Location: [facility];  Service: General;  Laterality: Left;  . PORT-A-CATH REMOVAL N/A 05/11/2018   Procedure: REMOVAL PORT-A-CATH;  Surgeon: Stark Klein, MD;  Location: [facility];  Service: General;  Laterality: N/A;  . PORTACATH PLACEMENT Right 07/13/2017  . PORTACATH PLACEMENT Right 07/13/2017   Procedure: INSERTION PORT-A-CATH;  Surgeon: [name], MD;  Location: [facility];  Service: General;  Laterality: Right;  . TONSILLECTOMY      I have reviewed the social history and family history with the patient and they are unchanged from previous note.  ALLERGIES:  has No Known Allergies.  MEDICATIONS:  Current Outpatient Medications  Medication Sig Dispense Refill  . anastrozole (ARIMIDEX) 1 MG tablet Take 1 tablet (1 mg total) by mouth daily. 90 tablet 1  . calcium citrate (CALCITRATE - DOSED IN MG ELEMENTAL CALCIUM) 950 MG tablet Take 200 mg of elemental calcium by mouth daily.    . Cholecalciferol (VITAMIN D3) 2000 units TABS Take by mouth.    . Multiple Vitamin (MULTIVITAMIN WITH MINERALS) TABS tablet Take 1 tablet by mouth daily.    . simvastatin (ZOCOR) 40 MG tablet Take 40 mg daily by  mouth.    . vitamin C (ASCORBIC ACID) 500 MG tablet Take 500 mg by mouth daily.    Marland Kitchen alendronate (FOSAMAX) 70 MG tablet Take 1 tablet (70 mg total) by mouth once a week. Take with a full glass of water on an empty stomach. 12 tablet 0  . polyethylene glycol powder (GLYCOLAX/MIRALAX) powder MIX 17 GRAMS IN LIQUID AND DRINK DAILY IN THE EVENING     No current facility-administered medications for this visit.    PHYSICAL EXAMINATION: ECOG PERFORMANCE STATUS: 0 - Asymptomatic  Vitals:   11/15/19 1206  BP: (!) 170/65  Pulse: 60  Resp: 18  Temp: 98.5 F (36.9 C)  SpO2: 99%   Filed Weights   11/15/19 1206  Weight: 141 lb 6.4 oz (64.1 kg)    GENERAL:alert, no distress and comfortable SKIN: no obvious rash NECK: soft tissue prominence in low neck/upper chest  LUNGS:  normal breathing effort NEURO: alert & oriented x 3 with fluent speech, normal gait  Breast exam performed by Dr. Burr Medico in exam room  LABORATORY DATA:  I have reviewed the data as listed CBC Latest Ref Rng & Units 11/15/2019 05/31/2019 11/17/2018  WBC 4.0 - 10.5 K/uL 4.5 5.1 4.2  Hemoglobin 12.0 - 15.0 g/dL 14.3 13.8 13.4  Hematocrit 36.0 - 46.0 % 42.6  41.9 40.3  Platelets 150 - 400 K/uL 208 227 216     CMP Latest Ref Rng & Units 11/15/2019 05/31/2019 11/17/2018  Glucose 70 - 99 mg/dL 92 95 98  BUN 8 - 23 mg/dL _0 Creatinine 0.44 - 1.00 mg/dL 0.59 0.64 0.71  Sodium 135 - 145 mmol/L 142 140 139  Potassium 3.5 - 5.1 mmol/L 4.6 4.0 4.1  Chloride 98 - 111 mmol/L 105 103 102  CO2 22 - 32 mmol/L _1 Calcium 8.9 - 10.3 mg/dL 8.9 9.0 8.9  Total Protein 6.5 - 8.1 g/dL 6.9 6.9 6.8  Total Bilirubin 0.3 - 1.2 mg/dL 0.4 0.4 0.4  Alkaline Phos 38 - 126 U/L 97 100 91  AST 15 - 41 U/L _2 ALT 0 - 44 U/L _3 RADIOGRAPHIC STUDIES: I have personally reviewed the radiological images as listed and agreed with the findings in the report. No results found.   ASSESSMENT & PLAN: GOLDIA LIGMAN is a  84 y.o. female with   1. Malignant neoplasm of upper-outer quadrant of left breast in female, invasive ductal carcinoma, pT2N0M0, Stage: 1A, triple positive, Grade 3 -She was diagnosed in 06/2017. She is s/p left mastectomy, adjuvant chemo TCH and maintenance Herceptin.  -Given she had radiation for her prior breast cancer she will foregoadjuvantradiation -She declined oral her2 antibody Neratinib due toconcerns ofside effects.  -She started adjuvant anti-estrogen with anastrozole in 02/2018, tolerating well. No joint pain or hot flashes. Plan to take for 5-10 years -Mammogram in 06/2019 was negative.  -Ms. Sisemore is clinically doing well. CBC and CMP are normal. Breast exam performed by Dr. Burr Medico was benign. No clinical concern for recurrence.  -continue breast cancer surveillance.  -next f/u Spring 2021.   2.Left arm lymphedema -she completed PT and wears LE sleeve occasionally -mild, continue monitoring    3. History of invasive and in situ ductal carcinoma of left breast, 10/26/1999, Estrogen receptor negative, stage 1, Grade 3 -Treated in [place]  -s/p Lumpectomy (sentinel node negative, 2-3 removed), chemo, radiation  4.Bone health -10/11/2017 DEXAnormal withlowest T-score of -0.9, which has improved from priorosteopenicscans  -She previously took Fosamax. Now on calcium and vitamin D supplement -repeat DEXA on 10/29/19 showed recurrent osteopenia at left femur neck T score -1.5 with a FRAX 10-year Probability of Fracture of 19.3% major osteoporotic fracture and 4.5% risk of hip fracture. -I called her to discuss bisphosphonates and she agreed to zometa, given the added benefit of reducing bone mets in breast cancer patients. However, today she prefers to go back on fosamax which she tolerated well. This is reasonable and I prescribed for her today  PLAN: -Labs reviewed  -Continue breast cancer surveillance  -Continue Anastrozole -Begin fosamax 70 mg weekly,  reviewed dosing instructions -F/u Spring 2021  No problem-specific Assessment & Plan notes found for this encounter.   No orders of the defined types were placed in this encounter.  All questions were answered. The patient knows to call the clinic with any problems, questions or concerns. No barriers to learning were detected. I spent 45 minutes on today's face-to-face video-enabled call, and more than 50% was on counseling and review of test results. A small portion of today's visit was performed by MD in the exam room.      Alla Feeling, NP 11/15/19

## 2019-11-18 ENCOUNTER — Ambulatory Visit: Payer: Medicare HMO | Attending: Internal Medicine

## 2019-11-18 DIAGNOSIS — Z23 Encounter for immunization: Secondary | ICD-10-CM | POA: Insufficient documentation

## 2019-11-18 NOTE — Progress Notes (Signed)
   Covid-19 Vaccination Clinic  Name:  Kathleen Hanson    MRN: [id number] DOB: Jun 29, 1936  11/18/2019  [name] was observed post Covid-19 immunization for 15 minutes without incidence. She was provided with Vaccine Information Sheet and instruction to access the V-Safe system.   [name] was instructed to call 911 with any severe reactions post vaccine: Marland Kitchen Difficulty breathing  . Swelling of your face and throat  . A fast heartbeat  . A bad rash all over your body  . Dizziness and weakness    Immunizations Administered    Name Date Dose VIS Date Route   Pfizer COVID-19 Vaccine 11/18/2019 12:33 PM 0.3 mL 10/05/2019 Intramuscular   Manufacturer: [place]   Lot: [id number]   [place]: [id number]

## 2019-12-04 ENCOUNTER — Telehealth: Payer: Self-pay

## 2019-12-04 NOTE — Telephone Encounter (Signed)
I recommended zometa due to her high risk for major osteoporotic fracture and it's added benefit of reducing bone mets in breast cancer. If she does not want to take it now, that's her decision. I would recommend calcium and vitamin D for osteopenia.  Thanks, Regan Rakers

## 2019-12-04 NOTE — Telephone Encounter (Signed)
Pt wants to now does she really need to take fosomax or zometa for osteopenia.  She has been reading side effects and is concerned.

## 2019-12-10 ENCOUNTER — Ambulatory Visit: Payer: Medicare HMO | Attending: Internal Medicine

## 2019-12-10 DIAGNOSIS — Z23 Encounter for immunization: Secondary | ICD-10-CM

## 2019-12-10 NOTE — Progress Notes (Signed)
   Covid-19 Vaccination Clinic  Name:  Kathleen Hanson    MRN: [id number] DOB: February 06, 1936  12/10/2019  [name] was observed post Covid-19 immunization for 15 minutes without incidence. She was provided with Vaccine Information Sheet and instruction to access the V-Safe system.   [name] was instructed to call 911 with any severe reactions post vaccine: Marland Kitchen Difficulty breathing  . Swelling of your face and throat  . A fast heartbeat  . A bad rash all over your body  . Dizziness and weakness    Immunizations Administered    Name Date Dose VIS Date Route   Pfizer COVID-19 Vaccine 12/10/2019  1:31 PM 0.3 mL 10/05/2019 Intramuscular   Manufacturer: [place]   Lot: [id number]   [place]: [id number]

## 2019-12-11 ENCOUNTER — Telehealth: Payer: Self-pay | Admitting: *Deleted

## 2019-12-11 NOTE — Telephone Encounter (Signed)
Got it, this is fine with me.   [name] MD

## 2019-12-11 NOTE — Telephone Encounter (Signed)
Pt left message that she would rather not take the osteopenia med & states that she would rather skip than to put up with the side effects.  Message routed to Dr Burr Medico.

## 2019-12-29 ENCOUNTER — Other Ambulatory Visit: Payer: Self-pay | Admitting: Hematology

## 2019-12-29 DIAGNOSIS — Z170 Estrogen receptor positive status [ER+]: Secondary | ICD-10-CM

## 2019-12-29 DIAGNOSIS — C50412 Malignant neoplasm of upper-outer quadrant of left female breast: Secondary | ICD-10-CM

## 2020-01-17 ENCOUNTER — Other Ambulatory Visit: Payer: Self-pay | Admitting: Nurse Practitioner

## 2020-01-18 NOTE — Progress Notes (Signed)
Kathleen Hanson   [phone] [phone]   Clinic Follow up Note   Patient Care Team: Bernerd Limbo, MD as PCP - General (Family Medicine) [name], MD as Consulting Physician (General Surgery) [name], MD as Consulting Physician (Hematology) [name], MD as Consulting Physician (Radiation Oncology) Gardenia Phlegm, NP as Nurse Practitioner (Hematology and Oncology)  Date of Service:  01/30/2020  CHIEF COMPLAINT: F/u of left breast cancer  SUMMARY OF ONCOLOGIC HISTORY: Oncology History Overview Note  Cancer Staging Malignant neoplasm of upper-outer quadrant of left breast in female, estrogen receptor positive ([place]) Staging form: Breast, AJCC 8th Edition - Clinical stage from 07/06/2017: Stage IA (cT1c, cN0, cM0, G3, ER: Positive, PR: Positive, HER2: Positive) - Unsigned Staging comments: Staged at breast conference 9.12.18 - Pathologic stage from 07/13/2017: Stage IA (pT2, pN0, cM0, G3, ER: Positive, PR: Positive, HER2: Positive) - Signed by [name], MD on 08/16/2017     Malignant neoplasm of upper-outer quadrant of left breast in female, estrogen receptor positive ([place])  06/23/2017 Mammogram   Diagnostic mammogram 06/23/17 IMPRESSION: Indeterminate mass with irregular margins at the 2:30 position 4 cm from nipple measuring 1.4 x 0.8 x 1.2 cm in the upper-outer left breast.   06/28/2017 Initial Biopsy   Diagnosis 06/28/17 Breast, left, needle core biopsy, upper outer quadrant, 2:30 o'clock, 4cm from nipple - INVASIVE MAMMARY CARCINOMA WITH CALCIFICATIONS, SEE COMMENT.   06/28/2017 Receptors her2   Estrogen Receptor: 100%, POSITIVE, STRONG STAINING INTENSITY Progesterone Receptor: 20%, POSITIVE, STRONG STAINING INTENSITY Proliferation Marker Ki67: 25% HER2: Postive    06/30/2017 Initial Diagnosis   Malignant neoplasm of upper-outer quadrant of left breast in female, estrogen receptor positive ([place])   07/13/2017 Surgery   Left  mastectomy with SLN biopsy performed by Dr Marlou Starks.    07/13/2017 Pathology Results   Diagnosis  1. Breast, simple mastectomy, Left - INVASIVE AND IN SITU DUCTAL CARCINOMA, 4 CM, MSBR GRADE 3. - MARGINS NOT INVOLVED. - SEE ONCOLOGY TABLE. 2. Lymph node, sentinel, biopsy, Left #1 - ONE BENIGN LYMPH NODE (0/1). 3. Lymph node, biopsy, Left #2 - ONE BENIGN LYMPH NODE (0/1).   07/18/2017 - 07/27/2017 Hospital Admission   Admit date: 07/18/17 Admission diagnosis: spontaneous PNX Additional comments: the patient was admitted with a pneumothorax several days after a port was placed although her post procedure cxr was normal. She had a chest tube placed but had a persistent air leak. The pleurivac was placed to 40 cm h2o suction for several days and the pneumothorax finally resolved.    08/15/2017 Imaging   CT A/P IMPRESSION: 1. No CT findings to suggest metastatic disease involving the abdomen/pelvis. 2. Left chest wall fluid collection as discussed above. 3. Small right pleural effusion with minimal overlying atelectasis. 4. Cholelithiasis. 5. Small periumbilical abdominal wall hernia containing fat.    08/15/2017 Imaging   NM Whole Body Bone Scan FINDINGS: Uptake at the LEFT lateral aspects of the cervical and lower thoracic spine, typically degenerative.  Uptake at the shoulders, elbows, wrists, hips, and knees, typically degenerative.  Asymmetric uptake at the sternoclavicular joints RIGHT greater than LEFT which may also be degenerative.  No definite foci of abnormal osseous tracer accumulation are identified which are suspicious for osseous metastases.  Biconvex thoracolumbar scoliosis.  Expected urinary tract and soft tissue distribution of tracer.  IMPRESSION: Scattered likely degenerative type uptake as above.  No definite scintigraphic evidence of osseous metastatic disease.   08/16/2017 Genetic Testing   The patient had  genetic testing due to a personal and  family history of breast cancer.  The [employer] Multi-Cancer Panel was ordered. The Multi-Cancer Panel offered by [employer] includes sequencing and/or deletion duplication testing of the following 83 genes: ALK, APC, ATM, AXIN2,BAP1,  BARD1, BLM, BMPR1A, BRCA1, BRCA2, BRIP1, CASR, CDC73, CDH1, CDK4, CDKN1B, CDKN1C, CDKN2A (p14ARF), CDKN2A (p16INK4a), CEBPA, CHEK2, CTNNA1, DICER1, DIS3L2, EGFR (c.2369C>T, p.Thr790Met variant only), EPCAM (Deletion/duplication testing only), FH, FLCN, GATA2, GPC3, GREM1 (Promoter region deletion/duplication testing only), HOXB13 (c.251G>A, p.Gly84Glu), HRAS, KIT, MAX, MEN1, MET, MITF (c.952G>A, p.Glu318Lys variant only), MLH1, MSH2, MSH3, MSH6, MUTYH, NBN, NF1, NF2, NTHL1, PALB2, PDGFRA, PHOX2B, PMS2, POLD1, POLE, POT1, PRKAR1A, PTCH1, PTEN, RAD50, RAD51C, RAD51D, RB1, RECQL4, RET, RUNX1, SDHAF2, SDHA (sequence changes only), SDHB, SDHC, SDHD, SMAD4, SMARCA4, SMARCB1, SMARCE1, STK11, SUFU, TERC, TERT, TMEM127, TP53, TSC1, TSC2, VHL, WRN and WT1.   Results: Negative, no pathogenic variants identified in the genes analyzed.  The date of this test report is 08/16/2017.      08/26/2017 - 03/24/2018 Chemotherapy   adjuvant TCH with Onpro q3weeks for 4 cycles ending on 11/04/17 followed herceptin maintenance therapy every 3 weeks starting 12/28/17 and comepleted after 10 cycles on 03/24/18   08/31/2017 Breast US   On physical exam,there is a firm smooth mass in the low left axilla spanning at least 5 cm.  Ultrasound is performed, showing a complicated fluid collection which is predominantly anechoic measuring 5.2 x 3.9 x 3.5 cm. No blood flow was identified within the soft tissue component of the mass on color Doppler imaging. The appearance is most consistent with a postoperative seroma/resolving hematoma.  IMPRESSION: The complex fluid collection in the left axilla is very likely to represent a postoperative seroma/ resolving hematoma.  RECOMMENDATION: Three-month follow-up  left axillary ultrasound is recommended.    03/13/2018 -  Anti-estrogen oral therapy   Anastrozole daily   05/11/2018 Procedure   PAC removal by Dr. Lynelle Doctor on 05/11/18   05/18/2018 Survivorship   Survivorship Clinic with NP Jamelle Haring on 05/18/18   05/18/2018 Survivorship   Survivorship Clinic with NP Jamelle Haring on 05/18/18      CURRENT THERAPY:  Adjuvant Anastrozole daily starting 03/13/18 Fosamax '70mg'$  weekly starting in 11/2019   INTERVAL HISTORY:  Kathleen Hanson is here for a follow up of left breast cancer. She was last seen by me 4 months ago and seen by NP Lacie in interim. She presents to the clinic alone. She notes she has hearing aid in but still having hard time hearing today. She notes she has been taking polyethylene glycol powder for constipation and has reduced to 3 times a week. She also takes calcium and Vit D3. I reviewed her medication list with her. She is taking anastrozole and tolerating well with no issues. She notes mild right hip pain when walking. This is not constant and started 2 weeks ago. She is not sure if this is muscle related. She notes she is interested in losing some weight 10-20 pounds but understands she does not have to.    REVIEW OF SYSTEMS:   Constitutional: Denies fevers, chills or abnormal weight loss Eyes: Denies blurriness of vision Ears, nose, mouth, throat, and face: Denies mucositis or sore throat Respiratory: Denies cough, dyspnea or wheezes Cardiovascular: Denies palpitation, chest discomfort or lower extremity swelling Gastrointestinal:  Denies nausea, heartburn or change in bowel habits Skin: Denies abnormal skin rashes MSK: (+) Mild right hip ache Lymphatics: Denies new lymphadenopathy or easy bruising Neurological:Denies numbness, tingling or new  weaknesses Behavioral/Psych: Mood is stable, no new changes  All other systems were reviewed with the patient and are negative.  MEDICAL HISTORY:  Past Medical History:    Diagnosis Date  . Cancer of left breast ([place])   . Constipation   . Family history of breast cancer   . Fibrocystic breast   . Hyperlipidemia   . Ocular migraine    "used to have them monthly for a period of time; seemed to stop; now have had a couple in the last week" (07/13/2017)    SURGICAL HISTORY: Past Surgical History:  Procedure Laterality Date  . BREAST BIOPSY Right ~ 2001  . BREAST EXCISIONAL BIOPSY Right   . BREAST LUMPECTOMY Left 2001   chemo and radiation  . CESAREAN SECTION  X 1  . IR CATHETER TUBE CHANGE  07/20/2017  . MASTECTOMY COMPLETE / SIMPLE W/ SENTINEL NODE BIOPSY Left 07/13/2017    LEFT MASTECTOMY WITH SENTINEL NODE MAPPING ERAS PATHWAY [name] 07/13/2017  . MASTECTOMY W/ SENTINEL NODE BIOPSY Left 07/13/2017   Procedure: LEFT MASTECTOMY WITH SENTINEL NODE MAPPING ERAS PATHWAY;  Surgeon: [name], MD;  Location: [facility];  Service: General;  Laterality: Left;  . PORT-A-CATH REMOVAL N/A 05/11/2018   Procedure: REMOVAL PORT-A-CATH;  Surgeon: Stark Klein, MD;  Location: [facility];  Service: General;  Laterality: N/A;  . PORTACATH PLACEMENT Right 07/13/2017  . PORTACATH PLACEMENT Right 07/13/2017   Procedure: INSERTION PORT-A-CATH;  Surgeon: [name], MD;  Location: [facility];  Service: General;  Laterality: Right;  . TONSILLECTOMY      I have reviewed the social history and family history with the patient and they are unchanged from previous note.  ALLERGIES:  has No Known Allergies.  MEDICATIONS:  Current Outpatient Medications  Medication Sig Dispense Refill  . alendronate (FOSAMAX) 70 MG tablet TAKE 1 TABLET BY MOUTH ONCE A WEEK. TAKE WITH A FULL GLASS OF WATER ON AN EMPTY STOMACH. 12 tablet 0  . anastrozole (ARIMIDEX) 1 MG tablet TAKE 1 TABLET EVERY DAY 90 tablet 1  . calcium citrate (CALCITRATE - DOSED IN MG ELEMENTAL CALCIUM) 950 MG tablet Take 200 mg of elemental calcium by mouth daily.    . Cholecalciferol (VITAMIN D3) 2000 units  TABS Take by mouth.    . Multiple Vitamin (MULTIVITAMIN WITH MINERALS) TABS tablet Take 1 tablet by mouth daily.    . polyethylene glycol powder (GLYCOLAX/MIRALAX) powder MIX 17 GRAMS IN LIQUID AND DRINK DAILY IN THE EVENING    . simvastatin (ZOCOR) 40 MG tablet Take 40 mg daily by mouth.    . vitamin C (ASCORBIC ACID) 500 MG tablet Take 500 mg by mouth daily.     No current facility-administered medications for this visit.    PHYSICAL EXAMINATION: ECOG PERFORMANCE STATUS: 1 - Symptomatic but completely ambulatory  Vitals:   01/30/20 1030 01/30/20 1035  BP: (!) 172/58 (!) 165/77  Pulse: 65   Resp: 18   Temp: 98 F (36.7 C)   SpO2: 99%    Filed Weights   01/30/20 1030  Weight: 139 lb (63 kg)    GENERAL:alert, no distress and comfortable SKIN: skin color, texture, turgor are normal, no rashes or significant lesions EYES: normal, Conjunctiva are pink and non-injected, sclera clear  NECK: supple, thyroid normal size, non-tender, without nodularity LYMPH:  no palpable lymphadenopathy in the cervical, axillary  LUNGS: clear to auscultation and percussion with normal breathing effort HEART: regular rate & rhythm and no murmurs and no  lower extremity edema ABDOMEN:abdomen soft, non-tender and normal bowel sounds Musculoskeletal:no cyanosis of digits and no clubbing  NEURO: alert & oriented x 3 with fluent speech, no focal motor/sensory deficits BREAST: S/p left mastectomy: Surgical incision healed well with soft tissue swelling. No palpable mass, nodules or adenopathy bilaterally. Breast exam benign.   LABORATORY DATA:  I have reviewed the data as listed CBC Latest Ref Rng & Units 01/30/2020 11/15/2019 05/31/2019  WBC 4.0 - 10.5 K/uL 4.2 4.5 5.1  Hemoglobin 12.0 - 15.0 g/dL 14.5 14.3 13.8  Hematocrit 36.0 - 46.0 % 44.1 42.6 41.9  Platelets 150 - 400 K/uL 220 208 227     CMP Latest Ref Rng & Units 01/30/2020 11/15/2019 05/31/2019  Glucose 70 - 99 mg/dL 96 92 95  BUN 8 - 23 mg/dL _0 Creatinine 0.44 - 1.00 mg/dL 0.64 0.59 0.64  Sodium 135 - 145 mmol/L 142 142 140  Potassium 3.5 - 5.1 mmol/L 4.6 4.6 4.0  Chloride 98 - 111 mmol/L 105 105 103  CO2 22 - 32 mmol/L _1 Calcium 8.9 - 10.3 mg/dL 9.1 8.9 9.0  Total Protein 6.5 - 8.1 g/dL 6.8 6.9 6.9  Total Bilirubin 0.3 - 1.2 mg/dL 0.5 0.4 0.4  Alkaline Phos 38 - 126 U/L 87 97 100  AST 15 - 41 U/L _2 ALT 0 - 44 U/L _3 RADIOGRAPHIC STUDIES: I have personally reviewed the radiological images as listed and agreed with the findings in the report. No results found.   ASSESSMENT & PLAN:  CHANTAL WORTHEY is a 84 y.o. female with   1. Malignant neoplasm of upper-outer quadrant of left breast in female, invasive ductal carcinoma, pT2N0M0, Stage: 1A, triple positive, Grade 3 -She was diagnosed in 06/2017. She is s/p left mastectomy, adjuvant chemo TCH and maintenance Herceptin.  -Given she had radiation for her prior breast cancer she will foregoadjuvantradiation -She declined oral her2 antibody Neratinib due toconcerns ofside effects.  -She started ani-estrogen therapy with Anastrozole in 02/2018.She is tolerating well overall, will continue for 5-10 years. -She is clinically doing well. She does not mild hip ache when walking for the past 2 weeks. I encouraged her to watch this. Lab reviewed, her CBC and CMP are within normal limits. Her physical exam and her 05/2019 mammogram were unremarkable. There is no clinical concern for recurrence. -Continue surveillance. Next mammogram in 06/2020  -Continue Anastrozole  -F/u in 6 months with NP Lacie  -She notes she received both her COVID19 vaccinations.    2.Left arm lymphedema -Negative previous breast surgery, she had physical therapy after surgery, and wears compressionsleeves. Mostly resolved now, remaining in left forearm.  -mild, continue monitoring   3. History of invasive and in situ ductal carcinoma of left breast, 10/26/1999,  Estrogen receptor negative, stage 1, Grade 3 -Treated in [place]  -s/p Lumpectomy (sentinel node negative, 2-3 removed), chemo, radiation  4.Osteopenia  -10/11/2017 DEXAnormal withlowest T-score of -0.9, which has improved from priorosteopenicscans. Unfortunately her 10/2019 DEXA shows Osteopenia recurrence with lowest T-score at -1.5 at left hip.  -NP Lacie offered bisphosphonate with Zometa but she opted to restart Fosamax 37m weekly in 11/2019. She is tolerating well.  -I strongly encouraged her to continueto take calcium and vitamin D supplement  5. Elevated BP  -Has been high when she comes to clinic. BP at 165/77 today (01/30/20) -I recommend she check her BP at home weekly and if  above 140/90 regularly she should see her PCP about this.    PLAN: -She is clinically doing well  -I refilled Fosamax today -Continue anastrozole -Lab and f/u in 6 months with NP Lacie  -Mammogram in 06/2020   No problem-specific Assessment & Plan notes found for this encounter.   Orders Placed This Encounter  Procedures  . MM DIAG BREAST TOMO BILATERAL    Standing Status:   Future    Standing Expiration Date:   01/29/2021    Order Specific Question:   Reason for Exam (SYMPTOM  OR DIAGNOSIS REQUIRED)    Answer:   screening    Order Specific Question:   Preferred imaging location?    Answer:   [facility]   All questions were answered. The patient knows to call the clinic with any problems, questions or concerns. No barriers to learning was detected. The total time spent in the appointment was 25 minutes.     [name], MD 01/30/2020   I, Joslyn Devon, am acting as scribe for [name], MD.   I have reviewed the above documentation for accuracy and completeness, and I agree with the above.

## 2020-01-30 ENCOUNTER — Other Ambulatory Visit: Payer: Self-pay

## 2020-01-30 ENCOUNTER — Inpatient Hospital Stay: Payer: Medicare HMO | Admitting: Hematology

## 2020-01-30 ENCOUNTER — Encounter: Payer: Self-pay | Admitting: Hematology

## 2020-01-30 ENCOUNTER — Inpatient Hospital Stay: Payer: Medicare HMO | Attending: Hematology

## 2020-01-30 VITALS — BP 165/77 | HR 65 | Temp 98.0°F | Resp 18 | Ht 64.0 in | Wt 139.0 lb

## 2020-01-30 DIAGNOSIS — R030 Elevated blood-pressure reading, without diagnosis of hypertension: Secondary | ICD-10-CM | POA: Diagnosis not present

## 2020-01-30 DIAGNOSIS — Z9221 Personal history of antineoplastic chemotherapy: Secondary | ICD-10-CM | POA: Insufficient documentation

## 2020-01-30 DIAGNOSIS — Z79811 Long term (current) use of aromatase inhibitors: Secondary | ICD-10-CM | POA: Diagnosis not present

## 2020-01-30 DIAGNOSIS — Z170 Estrogen receptor positive status [ER+]: Secondary | ICD-10-CM | POA: Diagnosis not present

## 2020-01-30 DIAGNOSIS — Z1321 Encounter for screening for nutritional disorder: Secondary | ICD-10-CM

## 2020-01-30 DIAGNOSIS — Z9011 Acquired absence of right breast and nipple: Secondary | ICD-10-CM | POA: Insufficient documentation

## 2020-01-30 DIAGNOSIS — M8588 Other specified disorders of bone density and structure, other site: Secondary | ICD-10-CM | POA: Insufficient documentation

## 2020-01-30 DIAGNOSIS — C50412 Malignant neoplasm of upper-outer quadrant of left female breast: Secondary | ICD-10-CM

## 2020-01-30 DIAGNOSIS — Z86000 Personal history of in-situ neoplasm of breast: Secondary | ICD-10-CM | POA: Diagnosis not present

## 2020-01-30 DIAGNOSIS — Z79899 Other long term (current) drug therapy: Secondary | ICD-10-CM | POA: Diagnosis not present

## 2020-01-30 DIAGNOSIS — Z923 Personal history of irradiation: Secondary | ICD-10-CM | POA: Insufficient documentation

## 2020-01-30 DIAGNOSIS — I972 Postmastectomy lymphedema syndrome: Secondary | ICD-10-CM | POA: Diagnosis not present

## 2020-01-30 LAB — COMPREHENSIVE METABOLIC PANEL
ALT: 17 U/L (ref 0–44)
AST: 17 U/L (ref 15–41)
Albumin: 3.9 g/dL (ref 3.5–5.0)
Alkaline Phosphatase: 87 U/L (ref 38–126)
Anion gap: 9 (ref 5–15)
BUN: 12 mg/dL (ref 8–23)
CO2: 28 mmol/L (ref 22–32)
Calcium: 9.1 mg/dL (ref 8.9–10.3)
Chloride: 105 mmol/L (ref 98–111)
Creatinine, Ser: 0.64 mg/dL (ref 0.44–1.00)
GFR calc Af Amer: >60 mL/min (ref >60)
GFR calc non Af Amer: >60 mL/min (ref >60)
Glucose, Bld: 96 mg/dL (ref 70–99)
Potassium: 4.6 mmol/L (ref 3.5–5.1)
Sodium: 142 mmol/L (ref 135–145)
Total Bilirubin: 0.5 mg/dL (ref 0.3–1.2)
Total Protein: 6.8 g/dL (ref 6.5–8.1)

## 2020-01-30 LAB — VITAMIN D 25 HYDROXY (VIT D DEFICIENCY, FRACTURES): Vit D, 25-Hydroxy: 34.87 ng/mL (ref 30–100)

## 2020-01-30 LAB — CBC WITH DIFFERENTIAL/PLATELET
Abs Immature Granulocytes: 0.01 10*3/uL (ref 0.00–0.07)
Basophils Absolute: 0 10*3/uL (ref 0.0–0.1)
Basophils Relative: 1 %
Eosinophils Absolute: 0.1 10*3/uL (ref 0.0–0.5)
Eosinophils Relative: 3 %
HCT: 44.1 % (ref 36.0–46.0)
Hemoglobin: 14.5 g/dL (ref 12.0–15.0)
Immature Granulocytes: 0 %
Lymphocytes Relative: 25 %
Lymphs Abs: 1.1 10*3/uL (ref 0.7–4.0)
MCH: 31.4 pg (ref 26.0–34.0)
MCHC: 32.9 g/dL (ref 30.0–36.0)
MCV: 95.5 fL (ref 80.0–100.0)
Monocytes Absolute: 0.3 10*3/uL (ref 0.1–1.0)
Monocytes Relative: 7 %
Neutro Abs: 2.7 10*3/uL (ref 1.7–7.7)
Neutrophils Relative %: 64 %
Platelets: 220 10*3/uL (ref 150–400)
RBC: 4.62 MIL/uL (ref 3.87–5.11)
RDW: 12.4 % (ref 11.5–15.5)
WBC: 4.2 10*3/uL (ref 4.0–10.5)
nRBC: 0 % (ref 0.0–0.2)

## 2020-01-30 MED ORDER — ALENDRONATE SODIUM 70 MG PO TABS: ORAL_TABLET | ORAL | 0 refills | Status: DC

## 2020-01-31 ENCOUNTER — Telehealth: Payer: Self-pay | Admitting: Hematology

## 2020-01-31 NOTE — Telephone Encounter (Signed)
Scheduled appt per 4/7 los.  Left a vm of the appt date and time,

## 2020-03-31 ENCOUNTER — Ambulatory Visit: Payer: Medicare HMO | Admitting: Hematology

## 2020-03-31 ENCOUNTER — Other Ambulatory Visit: Payer: Medicare HMO

## 2020-04-03 ENCOUNTER — Ambulatory Visit: Payer: Medicare HMO | Admitting: Physician Assistant

## 2020-05-16 ENCOUNTER — Telehealth: Payer: Self-pay | Admitting: Nurse Practitioner

## 2020-05-16 NOTE — Telephone Encounter (Signed)
Rescheduled appointments per 7/22 provider message. Left message on Patient voicemail with updated appointment date and time

## 2020-06-07 ENCOUNTER — Other Ambulatory Visit: Payer: Self-pay | Admitting: Hematology

## 2020-06-07 DIAGNOSIS — Z170 Estrogen receptor positive status [ER+]: Secondary | ICD-10-CM

## 2020-06-07 DIAGNOSIS — C50412 Malignant neoplasm of upper-outer quadrant of left female breast: Secondary | ICD-10-CM

## 2020-06-13 ENCOUNTER — Encounter: Payer: Self-pay | Admitting: Physician Assistant

## 2020-06-13 ENCOUNTER — Ambulatory Visit (INDEPENDENT_AMBULATORY_CARE_PROVIDER_SITE_OTHER): Payer: Medicare HMO | Admitting: Physician Assistant

## 2020-06-13 ENCOUNTER — Other Ambulatory Visit: Payer: Self-pay

## 2020-06-13 DIAGNOSIS — Z86006 Personal history of melanoma in-situ: Secondary | ICD-10-CM

## 2020-06-13 DIAGNOSIS — Z86018 Personal history of other benign neoplasm: Secondary | ICD-10-CM

## 2020-06-13 DIAGNOSIS — Z1283 Encounter for screening for malignant neoplasm of skin: Secondary | ICD-10-CM

## 2020-06-13 DIAGNOSIS — Z85828 Personal history of other malignant neoplasm of skin: Secondary | ICD-10-CM | POA: Diagnosis not present

## 2020-06-13 NOTE — Progress Notes (Signed)
   Follow-Up Visit   Subjective  Kathleen Hanson is a 84 y.o. female who presents for the following: Follow-up.   The following portions of the chart were reviewed this encounter and updated as appropriate: Tobacco  Allergies  Meds  Problems  Med Hx  Surg Hx  Fam Hx      Objective  Well appearing patient in no apparent distress; mood and affect are within normal limits.  A full examination was performed including scalp, head, eyes, ears, nose, lips, neck, chest, axillae, abdomen, back, buttocks, bilateral upper extremities, bilateral lower extremities, hands, feet, fingers, toes, fingernails, and toenails. All findings within normal limits unless otherwise noted below. NO LAD  Objective  Head - to toe: No atypical nevi No signs of non-mole skin cancer.   Objective  Left Upper Back: Scar clear  Objective  Left upper leg, Right Lower Back: Scars clear  Objective  Left Thigh - inner: All scars clear   Assessment & Plan  Screening exam for skin cancer Head - to toe  Biannual skin examination  Personal history of melanoma in-situ Left Upper Back  Biannual skin examination  History of basal cell carcinoma (BCC) (2) Left upper leg; Right Lower Back  observe  History of dysplastic nevus Left Thigh - inner  observe    I, Lonzie Simmer, PA-C, have reviewed all documentation's for this visit.  The documentation on 06/13/20 for the exam, diagnosis, procedures and orders are all accurate and complete.

## 2020-06-26 ENCOUNTER — Ambulatory Visit
Admission: RE | Admit: 2020-06-26 | Discharge: 2020-06-26 | Disposition: A | Discharge status: Home / Self Care | Payer: Medicare HMO | Source: Ambulatory Visit | Attending: Hematology | Admitting: Hematology

## 2020-06-26 ENCOUNTER — Other Ambulatory Visit: Payer: Self-pay

## 2020-06-26 DIAGNOSIS — Z170 Estrogen receptor positive status [ER+]: Secondary | ICD-10-CM

## 2020-07-31 ENCOUNTER — Other Ambulatory Visit: Payer: Medicare HMO

## 2020-07-31 ENCOUNTER — Ambulatory Visit: Payer: Medicare HMO | Admitting: Nurse Practitioner

## 2020-07-31 ENCOUNTER — Ambulatory Visit: Payer: Medicare HMO | Admitting: Hematology

## 2020-08-04 ENCOUNTER — Inpatient Hospital Stay: Payer: Medicare HMO | Attending: Hematology

## 2020-08-04 ENCOUNTER — Other Ambulatory Visit: Payer: Self-pay

## 2020-08-04 ENCOUNTER — Encounter: Payer: Self-pay | Admitting: Nurse Practitioner

## 2020-08-04 ENCOUNTER — Inpatient Hospital Stay: Payer: Medicare HMO | Admitting: Nurse Practitioner

## 2020-08-04 DIAGNOSIS — Z170 Estrogen receptor positive status [ER+]: Secondary | ICD-10-CM | POA: Diagnosis not present

## 2020-08-04 DIAGNOSIS — I890 Lymphedema, not elsewhere classified: Secondary | ICD-10-CM | POA: Diagnosis not present

## 2020-08-04 DIAGNOSIS — Z1321 Encounter for screening for nutritional disorder: Secondary | ICD-10-CM

## 2020-08-04 DIAGNOSIS — C50412 Malignant neoplasm of upper-outer quadrant of left female breast: Secondary | ICD-10-CM | POA: Insufficient documentation

## 2020-08-04 LAB — CBC WITH DIFFERENTIAL/PLATELET
Abs Immature Granulocytes: 0.02 10*3/uL (ref 0.00–0.07)
Basophils Absolute: 0 10*3/uL (ref 0.0–0.1)
Basophils Relative: 0 %
Eosinophils Absolute: 0.1 10*3/uL (ref 0.0–0.5)
Eosinophils Relative: 1 %
HCT: 41.9 % (ref 36.0–46.0)
Hemoglobin: 13.9 g/dL (ref 12.0–15.0)
Immature Granulocytes: 0 %
Lymphocytes Relative: 20 %
Lymphs Abs: 1 10*3/uL (ref 0.7–4.0)
MCH: 31.1 pg (ref 26.0–34.0)
MCHC: 33.2 g/dL (ref 30.0–36.0)
MCV: 93.7 fL (ref 80.0–100.0)
Monocytes Absolute: 0.4 10*3/uL (ref 0.1–1.0)
Monocytes Relative: 8 %
Neutro Abs: 3.4 10*3/uL (ref 1.7–7.7)
Neutrophils Relative %: 71 %
Platelets: 212 10*3/uL (ref 150–400)
RBC: 4.47 MIL/uL (ref 3.87–5.11)
RDW: 13.5 % (ref 11.5–15.5)
WBC: 4.8 10*3/uL (ref 4.0–10.5)
nRBC: 0 % (ref 0.0–0.2)

## 2020-08-04 LAB — COMPREHENSIVE METABOLIC PANEL
ALT: 22 U/L (ref 0–44)
AST: 19 U/L (ref 15–41)
Albumin: 3.8 g/dL (ref 3.5–5.0)
Alkaline Phosphatase: 71 U/L (ref 38–126)
Anion gap: 6 (ref 5–15)
BUN: 12 mg/dL (ref 8–23)
CO2: 30 mmol/L (ref 22–32)
Calcium: 9.3 mg/dL (ref 8.9–10.3)
Chloride: 101 mmol/L (ref 98–111)
Creatinine, Ser: 0.59 mg/dL (ref 0.44–1.00)
GFR, Estimated: >60 mL/min (ref >60)
Glucose, Bld: 76 mg/dL (ref 70–99)
Potassium: 3.8 mmol/L (ref 3.5–5.1)
Sodium: 137 mmol/L (ref 135–145)
Total Bilirubin: 0.4 mg/dL (ref 0.3–1.2)
Total Protein: 6.8 g/dL (ref 6.5–8.1)

## 2020-08-04 LAB — VITAMIN D 25 HYDROXY (VIT D DEFICIENCY, FRACTURES): Vit D, 25-Hydroxy: 41.35 ng/mL (ref 30–100)

## 2020-08-04 MED ORDER — ANASTROZOLE 1 MG PO TABS: 1.0000 mg | ORAL_TABLET | Freq: Every day | ORAL | 3 refills | Status: DC

## 2020-08-04 NOTE — Progress Notes (Signed)
[place]   [phone] [phone]   Clinic Follow up Note   Patient Care Team: Bernerd Limbo, MD as PCP - General (Family Medicine) [name], MD as Consulting Physician (General Surgery) [name], MD as Consulting Physician (Hematology) [name], MD as Consulting Physician (Radiation Oncology) Gardenia Phlegm, NP as Nurse Practitioner (Hematology and Oncology) Warren Danes, PA-C as Physician Assistant (Dermatology) 08/04/2020  CHIEF COMPLAINT: Follow-up left breast cancer  SUMMARY OF ONCOLOGIC HISTORY: Oncology History Overview Note  Cancer Staging Malignant neoplasm of upper-outer quadrant of left breast in female, estrogen receptor positive ([place]) Staging form: Breast, AJCC 8th Edition - Clinical stage from 07/06/2017: Stage IA (cT1c, cN0, cM0, G3, ER: Positive, PR: Positive, HER2: Positive) - Unsigned Staging comments: Staged at breast conference 9.12.18 - Pathologic stage from 07/13/2017: Stage IA (pT2, pN0, cM0, G3, ER: Positive, PR: Positive, HER2: Positive) - Signed by [name], MD on 08/16/2017     Malignant neoplasm of upper-outer quadrant of left breast in female, estrogen receptor positive ([employer])  06/23/2017 Mammogram   Diagnostic mammogram 06/23/17 IMPRESSION: Indeterminate mass with irregular margins at the 2:30 position 4 cm from nipple measuring 1.4 x 0.8 x 1.2 cm in the upper-outer left breast.   06/28/2017 Initial Biopsy   Diagnosis 06/28/17 Breast, left, needle core biopsy, upper outer quadrant, 2:30 o'clock, 4cm from nipple - INVASIVE MAMMARY CARCINOMA WITH CALCIFICATIONS, SEE COMMENT.   06/28/2017 Receptors her2   Estrogen Receptor: 100%, POSITIVE, STRONG STAINING INTENSITY Progesterone Receptor: 20%, POSITIVE, STRONG STAINING INTENSITY Proliferation Marker Ki67: 25% HER2: Postive    06/30/2017 Initial Diagnosis   Malignant neoplasm of upper-outer quadrant of left breast in female, estrogen  receptor positive ([place])   07/13/2017 Surgery   Left mastectomy with SLN biopsy performed by Dr Marlou Starks.    07/13/2017 Pathology Results   Diagnosis  1. Breast, simple mastectomy, Left - INVASIVE AND IN SITU DUCTAL CARCINOMA, 4 CM, MSBR GRADE 3. - MARGINS NOT INVOLVED. - SEE ONCOLOGY TABLE. 2. Lymph node, sentinel, biopsy, Left #1 - ONE BENIGN LYMPH NODE (0/1). 3. Lymph node, biopsy, Left #2 - ONE BENIGN LYMPH NODE (0/1).   07/18/2017 - 07/27/2017 Hospital Admission   Admit date: 07/18/17 Admission diagnosis: spontaneous PNX Additional comments: the patient was admitted with a pneumothorax several days after a port was placed although her post procedure cxr was normal. She had a chest tube placed but had a persistent air leak. The pleurivac was placed to 40 cm h2o suction for several days and the pneumothorax finally resolved.    08/15/2017 Imaging   CT A/P IMPRESSION: 1. No CT findings to suggest metastatic disease involving the abdomen/pelvis. 2. Left chest wall fluid collection as discussed above. 3. Small right pleural effusion with minimal overlying atelectasis. 4. Cholelithiasis. 5. Small periumbilical abdominal wall hernia containing fat.    08/15/2017 Imaging   NM Whole Body Bone Scan FINDINGS: Uptake at the LEFT lateral aspects of the cervical and lower thoracic spine, typically degenerative.  Uptake at the shoulders, elbows, wrists, hips, and knees, typically degenerative.  Asymmetric uptake at the sternoclavicular joints RIGHT greater than LEFT which may also be degenerative.  No definite foci of abnormal osseous tracer accumulation are identified which are suspicious for osseous metastases.  Biconvex thoracolumbar scoliosis.  Expected urinary tract and soft tissue distribution of tracer.  IMPRESSION: Scattered likely degenerative type uptake as above.  No definite scintigraphic evidence of osseous metastatic disease.   08/16/2017 Genetic Testing  The patient had genetic testing due to a personal and family history of breast cancer.  The [employer] Multi-Cancer Panel was ordered. The Multi-Cancer Panel offered by [facility] includes sequencing and/or deletion duplication testing of the following 83 genes: ALK, APC, ATM, AXIN2,BAP1,  BARD1, BLM, BMPR1A, BRCA1, BRCA2, BRIP1, CASR, CDC73, CDH1, CDK4, CDKN1B, CDKN1C, CDKN2A (p14ARF), CDKN2A (p16INK4a), CEBPA, CHEK2, CTNNA1, DICER1, DIS3L2, EGFR (c.2369C>T, p.Thr790Met variant only), EPCAM (Deletion/duplication testing only), FH, FLCN, GATA2, GPC3, GREM1 (Promoter region deletion/duplication testing only), HOXB13 (c.251G>A, p.Gly84Glu), HRAS, KIT, MAX, MEN1, MET, MITF (c.952G>A, p.Glu318Lys variant only), MLH1, MSH2, MSH3, MSH6, MUTYH, NBN, NF1, NF2, NTHL1, PALB2, PDGFRA, PHOX2B, PMS2, POLD1, POLE, POT1, PRKAR1A, PTCH1, PTEN, RAD50, RAD51C, RAD51D, RB1, RECQL4, RET, RUNX1, SDHAF2, SDHA (sequence changes only), SDHB, SDHC, SDHD, SMAD4, SMARCA4, SMARCB1, SMARCE1, STK11, SUFU, TERC, TERT, TMEM127, TP53, TSC1, TSC2, VHL, WRN and WT1.   Results: Negative, no pathogenic variants identified in the genes analyzed.  The date of this test report is 08/16/2017.      08/26/2017 - 03/24/2018 Chemotherapy   adjuvant TCH with Onpro q3weeks for 4 cycles ending on 11/04/17 followed herceptin maintenance therapy every 3 weeks starting 12/28/17 and comepleted after 10 cycles on 03/24/18   08/31/2017 Breast US   On physical exam,there is a firm smooth mass in the low left axilla spanning at least 5 cm.  Ultrasound is performed, showing a complicated fluid collection which is predominantly anechoic measuring 5.2 x 3.9 x 3.5 cm. No blood flow was identified within the soft tissue component of the mass on color Doppler imaging. The appearance is most consistent with a postoperative seroma/resolving hematoma.  IMPRESSION: The complex fluid collection in the left axilla is very likely to represent a postoperative seroma/  resolving hematoma.  RECOMMENDATION: Three-month follow-up left axillary ultrasound is recommended.    03/13/2018 -  Anti-estrogen oral therapy   Anastrozole daily   05/11/2018 Procedure   PAC removal by Dr. Lynelle Doctor on 05/11/18   05/18/2018 Survivorship   Survivorship Clinic with NP Jamelle Haring on 05/18/18   05/18/2018 Survivorship   Survivorship Clinic with NP Jamelle Haring on 05/18/18     CURRENT THERAPY:  Adjuvant Anastrozole daily starting 03/13/18 Fosamax 29m weekly starting in 11/2019   INTERVAL HISTORY: Ms. GDreesereturns for follow-up as scheduled.  She was last seen by Dr. FBurr Medicoon 01/30/2020.  A annual screening mammogram on 06/26/2020 was negative.  She feels well in general.  Got her Covid booster last week.  She continues anastrozole tolerating it "okay."  She has scattered mild intermittent body aches, nothing significant.  She has mild hot flashes early in the morning, tolerable.  No new breast concerns; she can feel a rib on the left chest wall.  She continues to have constipation and urge incontinence, performing pelvic floor exercises.  No recent fever, chills, cough, chest pain, dyspnea, or other concerns.     MEDICAL HISTORY:  Past Medical History:  Diagnosis Date  . Cancer of left breast ([place]   . Constipation   . Family history of breast cancer   . Fibrocystic breast   . Hyperlipidemia   . Ocular migraine    "used to have them monthly for a period of time; seemed to stop; now have had a couple in the last week" (07/13/2017)    SURGICAL HISTORY: Past Surgical History:  Procedure Laterality Date  . BREAST BIOPSY Right ~ 2001  . BREAST EXCISIONAL BIOPSY Right   . BREAST LUMPECTOMY Left 2001   chemo and radiation  .  CESAREAN SECTION  X 1  . IR CATHETER TUBE CHANGE  07/20/2017  . MASTECTOMY COMPLETE / SIMPLE W/ SENTINEL NODE BIOPSY Left 07/13/2017    LEFT MASTECTOMY WITH SENTINEL NODE MAPPING ERAS PATHWAY [name] 07/13/2017  . MASTECTOMY W/ SENTINEL NODE  BIOPSY Left 07/13/2017   Procedure: LEFT MASTECTOMY WITH SENTINEL NODE MAPPING ERAS PATHWAY;  Surgeon: [name], MD;  Location: [facility];  Service: General;  Laterality: Left;  . PORT-A-CATH REMOVAL N/A 05/11/2018   Procedure: REMOVAL PORT-A-CATH;  Surgeon: Stark Klein, MD;  Location: [facility];  Service: General;  Laterality: N/A;  . PORTACATH PLACEMENT Right 07/13/2017  . PORTACATH PLACEMENT Right 07/13/2017   Procedure: INSERTION PORT-A-CATH;  Surgeon: [name], MD;  Location: [place] [facility];  Service: General;  Laterality: Right;  . TONSILLECTOMY      I have reviewed the social history and family history with the patient and they are unchanged from previous note.  ALLERGIES:  has No Known Allergies.  MEDICATIONS:  Current Outpatient Medications  Medication Sig Dispense Refill  . alendronate (FOSAMAX) 70 MG tablet TAKE 1 TABLET BY MOUTH ONCE A WEEK. TAKE WITH A FULL GLASS OF WATER ON AN EMPTY STOMACH. 12 tablet 0  . anastrozole (ARIMIDEX) 1 MG tablet Take 1 tablet (1 mg total) by mouth daily. 90 tablet 3  . calcium citrate (CALCITRATE - DOSED IN MG ELEMENTAL CALCIUM) 950 MG tablet Take 200 mg of elemental calcium by mouth daily.    . Cholecalciferol (VITAMIN D3) 2000 units TABS Take by mouth.    . Multiple Vitamin (MULTIVITAMIN WITH MINERALS) TABS tablet Take 1 tablet by mouth daily.    . simvastatin (ZOCOR) 40 MG tablet Take 40 mg daily by mouth.    . vitamin C (ASCORBIC ACID) 500 MG tablet Take 500 mg by mouth daily.     No current facility-administered medications for this visit.    PHYSICAL EXAMINATION:  Vitals:   08/04/20 1258  BP: (!) 152/73  Pulse: (!) 58  Resp: 18  Temp: 98.5 F (36.9 C)  SpO2: 97%   Filed Weights   08/04/20 1258  Weight: 137 lb 12.8 oz (62.5 kg)    GENERAL:alert, no distress and comfortable SKIN: No rash to exposed skin EYES:  sclera clear NECK: Without mass LYMPH:  no palpable cervical, supraclavicular, or axillary  lymphadenopathy LUNGS:  normal breathing effort HEART:  no lower extremity edema NEURO: alert & oriented x 3 with fluent speech Breast exam: S/p left mastectomy, incision completely healed.  No palpable mass or nodularity along the left chest wall, right breast, or either axilla that I could appreciate.  LABORATORY DATA:  I have reviewed the data as listed CBC Latest Ref Rng & Units 08/04/2020 01/30/2020 11/15/2019  WBC 4.0 - 10.5 K/uL 4.8 4.2 4.5  Hemoglobin 12.0 - 15.0 g/dL 13.9 14.5 14.3  Hematocrit 36 - 46 % 41.9 44.1 42.6  Platelets 150 - 400 K/uL 212 220 208     CMP Latest Ref Rng & Units 08/04/2020 01/30/2020 11/15/2019  Glucose 70 - 99 mg/dL 76 96 92  BUN 8 - 23 mg/dL _0 Creatinine 0.44 - 1.00 mg/dL 0.59 0.64 0.59  Sodium 135 - 145 mmol/L 137 142 142  Potassium 3.5 - 5.1 mmol/L 3.8 4.6 4.6  Chloride 98 - 111 mmol/L 101 105 105  CO2 22 - 32 mmol/L _1 Calcium 8.9 - 10.3 mg/dL 9.3 9.1 8.9  Total Protein 6.5 - 8.1 g/dL 6.8  6.8 6.9  Total Bilirubin 0.3 - 1.2 mg/dL 0.4 0.5 0.4  Alkaline Phos 38 - 126 U/L 71 87 97  AST 15 - 41 U/L _0 ALT 0 - 44 U/L _1 RADIOGRAPHIC STUDIES: I have personally reviewed the radiological images as listed and agreed with the findings in the report. No results found.   ASSESSMENT & PLAN: Paidyn Mcferran Guilmetteis a 84 y.o.femalewith   1. Malignant neoplasm of upper-outer quadrant of left breast in female, invasive ductal carcinoma, pT2N0M0, Stage: 1A, triple positive, Grade 3 -She was diagnosed in 06/2017. She is s/p left mastectomy, adjuvant chemo TCH and maintenance Herceptin.  -Given she had radiation for her prior breast cancer she will foregoadjuvantradiation -She declined oral her2 antibody Neratinib due toconcerns ofside effects.  -She started adjuvant anti-estrogen with anastrozole in 02/2018, tolerating well. No joint pain or hot flashes. Plan to take for 5-10 years -Right breast mammogram in 06/2020,  negative -Continue AI and surveillance  2.Left arm lymphedema -she completed PT and wears LE sleeve occasionally -mild, continue monitoring   3. History of invasive and in situ ductal carcinoma of left breast, 10/26/1999, Estrogen receptor negative, stage 1, Grade 3 -Treated in [place]  -s/p Lumpectomy (sentinel node negative, 2-3 removed), chemo, radiation  4.Bone health -10/11/2017 DEXAnormal withlowest T-score of -0.9, which has improved from priorosteopenicscans  -She previously took Fosamax. Now on calcium and vitamin D supplement -repeat DEXA on 10/29/19 showed recurrent osteopenia at left femur neck T score -1.5 with a FRAX 10-year Probability of Fracture of 19.3% major osteoporotic fracture and 4.5% risk of hip fracture. -I previously recommended Zometa but she preferred to go back on Fosamax which she has tolerated well  Disposition: Ms. Chawla is clinically doing well.  She continues anastrozole which she tolerates well overall with mild hot flashes.  Breast exam is benign, CBC and CMP are normal.  No clinical concern for breast cancer recurrence.    Right breast screening mammogram was negative in 06/2020.  She will continue AI and surveillance.  Next routine follow-up in 6 months.  All questions were answered. The patient knows to call the clinic with any problems, questions or concerns. No barriers to learning were detected.     Alla Feeling, NP 08/04/20

## 2020-08-05 ENCOUNTER — Telehealth: Payer: Self-pay | Admitting: Hematology

## 2020-08-05 NOTE — Telephone Encounter (Signed)
Scheduled per 10/11 los. Unable to reach pt. Left voicemail with appt times and date.

## 2020-12-04 ENCOUNTER — Emergency Department (HOSPITAL_COMMUNITY): Payer: Medicare HMO

## 2020-12-04 ENCOUNTER — Emergency Department (HOSPITAL_COMMUNITY)
Admission: EM | Admit: 2020-12-04 | Discharge: 2020-12-04 | Disposition: A | Discharge status: Home / Self Care | Payer: Medicare HMO | Attending: Emergency Medicine | Admitting: Emergency Medicine

## 2020-12-04 ENCOUNTER — Encounter (HOSPITAL_COMMUNITY): Payer: Self-pay

## 2020-12-04 ENCOUNTER — Other Ambulatory Visit: Payer: Self-pay

## 2020-12-04 DIAGNOSIS — I2510 Atherosclerotic heart disease of native coronary artery without angina pectoris: Secondary | ICD-10-CM | POA: Diagnosis not present

## 2020-12-04 DIAGNOSIS — I700 Atherosclerosis of aorta: Secondary | ICD-10-CM | POA: Diagnosis not present

## 2020-12-04 DIAGNOSIS — S2241XA Multiple fractures of ribs, right side, initial encounter for closed fracture: Secondary | ICD-10-CM | POA: Insufficient documentation

## 2020-12-04 DIAGNOSIS — R911 Solitary pulmonary nodule: Secondary | ICD-10-CM | POA: Insufficient documentation

## 2020-12-04 DIAGNOSIS — Y92410 Unspecified street and highway as the place of occurrence of the external cause: Secondary | ICD-10-CM | POA: Diagnosis not present

## 2020-12-04 DIAGNOSIS — Z87891 Personal history of nicotine dependence: Secondary | ICD-10-CM | POA: Insufficient documentation

## 2020-12-04 DIAGNOSIS — K439 Ventral hernia without obstruction or gangrene: Secondary | ICD-10-CM | POA: Insufficient documentation

## 2020-12-04 DIAGNOSIS — I2584 Coronary atherosclerosis due to calcified coronary lesion: Secondary | ICD-10-CM | POA: Diagnosis not present

## 2020-12-04 DIAGNOSIS — K8020 Calculus of gallbladder without cholecystitis without obstruction: Secondary | ICD-10-CM | POA: Insufficient documentation

## 2020-12-04 DIAGNOSIS — Z853 Personal history of malignant neoplasm of breast: Secondary | ICD-10-CM | POA: Diagnosis not present

## 2020-12-04 DIAGNOSIS — S299XXA Unspecified injury of thorax, initial encounter: Secondary | ICD-10-CM | POA: Diagnosis present

## 2020-12-04 LAB — COMPREHENSIVE METABOLIC PANEL
ALT: 44 U/L (ref 0–44)
AST: 49 U/L — ABNORMAL HIGH (ref 15–41)
Albumin: 4.6 g/dL (ref 3.5–5.0)
Alkaline Phosphatase: 65 U/L (ref 38–126)
Anion gap: 12 (ref 5–15)
BUN: 16 mg/dL (ref 8–23)
CO2: 27 mmol/L (ref 22–32)
Calcium: 9.2 mg/dL (ref 8.9–10.3)
Chloride: 99 mmol/L (ref 98–111)
Creatinine, Ser: 0.49 mg/dL (ref 0.44–1.00)
GFR, Estimated: >60 mL/min (ref >60)
Glucose, Bld: 120 mg/dL — ABNORMAL HIGH (ref 70–99)
Potassium: 3.5 mmol/L (ref 3.5–5.1)
Sodium: 138 mmol/L (ref 135–145)
Total Bilirubin: 0.8 mg/dL (ref 0.3–1.2)
Total Protein: 7.8 g/dL (ref 6.5–8.1)

## 2020-12-04 LAB — CBC WITH DIFFERENTIAL/PLATELET
Abs Immature Granulocytes: 0.12 10*3/uL — ABNORMAL HIGH (ref 0.00–0.07)
Basophils Absolute: 0 10*3/uL (ref 0.0–0.1)
Basophils Relative: 0 %
Eosinophils Absolute: 0 10*3/uL (ref 0.0–0.5)
Eosinophils Relative: 0 %
HCT: 46.5 % — ABNORMAL HIGH (ref 36.0–46.0)
Hemoglobin: 15.2 g/dL — ABNORMAL HIGH (ref 12.0–15.0)
Immature Granulocytes: 1 %
Lymphocytes Relative: 7 %
Lymphs Abs: 1 10*3/uL (ref 0.7–4.0)
MCH: 31.5 pg (ref 26.0–34.0)
MCHC: 32.7 g/dL (ref 30.0–36.0)
MCV: 96.5 fL (ref 80.0–100.0)
Monocytes Absolute: 0.9 10*3/uL (ref 0.1–1.0)
Monocytes Relative: 7 %
Neutro Abs: 10.8 10*3/uL — ABNORMAL HIGH (ref 1.7–7.7)
Neutrophils Relative %: 85 %
Platelets: 256 10*3/uL (ref 150–400)
RBC: 4.82 MIL/uL (ref 3.87–5.11)
RDW: 13.2 % (ref 11.5–15.5)
WBC: 12.9 10*3/uL — ABNORMAL HIGH (ref 4.0–10.5)
nRBC: 0 % (ref 0.0–0.2)

## 2020-12-04 MED ORDER — HYDROCODONE-ACETAMINOPHEN 5-325 MG PO TABS: 1.0000 | ORAL_TABLET | Freq: Two times a day (BID) | ORAL | 0 refills | Status: DC | PRN

## 2020-12-04 MED ORDER — IOHEXOL 300 MG/ML  SOLN
100.0000 mL | Freq: Once | INTRAMUSCULAR | Status: AC | PRN
  Administered 2020-12-04: 100 mL via INTRAVENOUS

## 2020-12-04 MED ORDER — FENTANYL CITRATE (PF) 100 MCG/2ML IJ SOLN
50.0000 ug | Freq: Once | INTRAMUSCULAR | Status: AC
  Administered 2020-12-04: 50 ug via INTRAVENOUS
  Filled 2020-12-04: qty 2

## 2020-12-04 MED ORDER — ONDANSETRON HCL 4 MG/2ML IJ SOLN
4.0000 mg | Freq: Once | INTRAMUSCULAR | Status: AC
  Administered 2020-12-04: 4 mg via INTRAVENOUS
  Filled 2020-12-04: qty 2

## 2020-12-04 MED ORDER — ACETAMINOPHEN 325 MG PO TABS
650.0000 mg | ORAL_TABLET | Freq: Once | ORAL | Status: AC
  Administered 2020-12-04: 650 mg via ORAL
  Filled 2020-12-04 (×2): qty 2
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
## 2020-12-04 NOTE — Discharge Instructions (Addendum)
Use the incentive spirometer 10 times per hour every hour you are awake  You can rotate tylenol 650mg  and 600mg  of ibuprofen every 6 hours for pain. If you have breakthrough pain you may take one tablet of the Norco every 6 hours.  Do not operate machinery, drive a car, or work while taking Norco as it can make you drowsy.   Do not take more than 4000 mg of tylenol daily.   You will are noted to have multiple incidental findings on your CT scans today.  You will need to follow-up with your regular doctor about the findings including the lung nodule.  Please follow-up within the next 5 to 7 days for reassessment and return to the emergency department for any new or worsening symptoms.
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
## 2020-12-17 ENCOUNTER — Ambulatory Visit: Payer: Medicare HMO | Admitting: Physician Assistant

## 2020-12-19 ENCOUNTER — Ambulatory Visit: Payer: Medicare HMO | Admitting: Physician Assistant

## 2020-12-26 ENCOUNTER — Other Ambulatory Visit: Payer: Self-pay

## 2020-12-26 MED ORDER — ALENDRONATE SODIUM 70 MG PO TABS: ORAL_TABLET | ORAL | 2 refills | Status: DC

## 2020-12-29 ENCOUNTER — Telehealth: Payer: Self-pay

## 2020-12-29 NOTE — Telephone Encounter (Signed)
I spoke with patient . She has mild swelling in her left axilla and upper arm.  She is concerned and wants to be evaluated.  Routine f/u appt moved from April to March. Pt verbalized understanding.

## 2020-12-29 NOTE — Telephone Encounter (Signed)
Patient left message with [facility] this weekend.  She stated she is having pain under her L arm and around in that area.  She states this started months ago.  I returned her call this am and left vm for her to call me back.

## 2020-12-30 ENCOUNTER — Other Ambulatory Visit: Payer: Self-pay | Admitting: Hematology

## 2021-01-07 NOTE — Progress Notes (Incomplete)
[place]   [phone] [phone]   Clinic Follow up Note   Patient Care Team: Bernerd Limbo, MD as PCP - General (Family Medicine) [name], MD as Consulting Physician (General Surgery) [name], MD as Consulting Physician (Hematology) [name], MD as Consulting Physician (Radiation Oncology) Gardenia Phlegm, NP as Nurse Practitioner (Hematology and Oncology) Warren Danes, PA-C as Physician Assistant (Dermatology)  Date of Service:  01/07/2021  CHIEF COMPLAINT: F/u of left breast cancer  SUMMARY OF ONCOLOGIC HISTORY: Oncology History Overview Note  Cancer Staging Malignant neoplasm of upper-outer quadrant of left breast in female, estrogen receptor positive ([place]) Staging form: Breast, AJCC 8th Edition - Clinical stage from 07/06/2017: Stage IA (cT1c, cN0, cM0, G3, ER: Positive, PR: Positive, HER2: Positive) - Unsigned Staging comments: Staged at breast conference 9.12.18 - Pathologic stage from 07/13/2017: Stage IA (pT2, pN0, cM0, G3, ER: Positive, PR: Positive, HER2: Positive) - Signed by [name], MD on 08/16/2017     Malignant neoplasm of upper-outer quadrant of left breast in female, estrogen receptor positive ([place])  06/23/2017 Mammogram   Diagnostic mammogram 06/23/17 IMPRESSION: Indeterminate mass with irregular margins at the 2:30 position 4 cm from nipple measuring 1.4 x 0.8 x 1.2 cm in the upper-outer left breast.   06/28/2017 Initial Biopsy   Diagnosis 06/28/17 Breast, left, needle core biopsy, upper outer quadrant, 2:30 o'clock, 4cm from nipple - INVASIVE MAMMARY CARCINOMA WITH CALCIFICATIONS, SEE COMMENT.   06/28/2017 Receptors her2   Estrogen Receptor: 100%, POSITIVE, STRONG STAINING INTENSITY Progesterone Receptor: 20%, POSITIVE, STRONG STAINING INTENSITY Proliferation Marker Ki67: 25% HER2: Postive    06/30/2017 Initial Diagnosis   Malignant neoplasm of upper-outer quadrant of left breast in female,  estrogen receptor positive ([place])   07/13/2017 Surgery   Left mastectomy with SLN biopsy performed by Dr Marlou Starks.    07/13/2017 Pathology Results   Diagnosis  1. Breast, simple mastectomy, Left - INVASIVE AND IN SITU DUCTAL CARCINOMA, 4 CM, MSBR GRADE 3. - MARGINS NOT INVOLVED. - SEE ONCOLOGY TABLE. 2. Lymph node, sentinel, biopsy, Left #1 - ONE BENIGN LYMPH NODE (0/1). 3. Lymph node, biopsy, Left #2 - ONE BENIGN LYMPH NODE (0/1).   07/18/2017 - 07/27/2017 Hospital Admission   Admit date: 07/18/17 Admission diagnosis: spontaneous PNX Additional comments: the patient was admitted with a pneumothorax several days after a port was placed although her post procedure cxr was normal. She had a chest tube placed but had a persistent air leak. The pleurivac was placed to 40 cm h2o suction for several days and the pneumothorax finally resolved.    08/15/2017 Imaging   CT A/P IMPRESSION: 1. No CT findings to suggest metastatic disease involving the abdomen/pelvis. 2. Left chest wall fluid collection as discussed above. 3. Small right pleural effusion with minimal overlying atelectasis. 4. Cholelithiasis. 5. Small periumbilical abdominal wall hernia containing fat.    08/15/2017 Imaging   NM Whole Body Bone Scan FINDINGS: Uptake at the LEFT lateral aspects of the cervical and lower thoracic spine, typically degenerative.  Uptake at the shoulders, elbows, wrists, hips, and knees, typically degenerative.  Asymmetric uptake at the sternoclavicular joints RIGHT greater than LEFT which may also be degenerative.  No definite foci of abnormal osseous tracer accumulation are identified which are suspicious for osseous metastases.  Biconvex thoracolumbar scoliosis.  Expected urinary tract and soft tissue distribution of tracer.  IMPRESSION: Scattered likely degenerative type uptake as above.  No definite scintigraphic evidence of osseous metastatic disease.  08/16/2017 Genetic  Testing   The patient had genetic testing due to a personal and family history of breast cancer.  The [employer] Multi-Cancer Panel was ordered. The Multi-Cancer Panel offered by [employer] includes sequencing and/or deletion duplication testing of the following 83 genes: ALK, APC, ATM, AXIN2,BAP1,  BARD1, BLM, BMPR1A, BRCA1, BRCA2, BRIP1, CASR, CDC73, CDH1, CDK4, CDKN1B, CDKN1C, CDKN2A (p14ARF), CDKN2A (p16INK4a), CEBPA, CHEK2, CTNNA1, DICER1, DIS3L2, EGFR (c.2369C>T, p.Thr790Met variant only), EPCAM (Deletion/duplication testing only), FH, FLCN, GATA2, GPC3, GREM1 (Promoter region deletion/duplication testing only), HOXB13 (c.251G>A, p.Gly84Glu), HRAS, KIT, MAX, MEN1, MET, MITF (c.952G>A, p.Glu318Lys variant only), MLH1, MSH2, MSH3, MSH6, MUTYH, NBN, NF1, NF2, NTHL1, PALB2, PDGFRA, PHOX2B, PMS2, POLD1, POLE, POT1, PRKAR1A, PTCH1, PTEN, RAD50, RAD51C, RAD51D, RB1, RECQL4, RET, RUNX1, SDHAF2, SDHA (sequence changes only), SDHB, SDHC, SDHD, SMAD4, SMARCA4, SMARCB1, SMARCE1, STK11, SUFU, TERC, TERT, TMEM127, TP53, TSC1, TSC2, VHL, WRN and WT1.   Results: Negative, no pathogenic variants identified in the genes analyzed.  The date of this test report is 08/16/2017.      08/26/2017 - 03/24/2018 Chemotherapy   adjuvant TCH with Onpro q3weeks for 4 cycles ending on 11/04/17 followed herceptin maintenance therapy every 3 weeks starting 12/28/17 and comepleted after 10 cycles on 03/24/18   08/31/2017 Breast US   On physical exam,there is a firm smooth mass in the low left axilla spanning at least 5 cm.  Ultrasound is performed, showing a complicated fluid collection which is predominantly anechoic measuring 5.2 x 3.9 x 3.5 cm. No blood flow was identified within the soft tissue component of the mass on color Doppler imaging. The appearance is most consistent with a postoperative seroma/resolving hematoma.  IMPRESSION: The complex fluid collection in the left axilla is very likely to represent a postoperative  seroma/ resolving hematoma.  RECOMMENDATION: Three-month follow-up left axillary ultrasound is recommended.    03/13/2018 -  Anti-estrogen oral therapy   Anastrozole daily   05/11/2018 Procedure   PAC removal by Dr. Lynelle Doctor on 05/11/18   05/18/2018 Survivorship   Survivorship Clinic with NP Jamelle Haring on 05/18/18   05/18/2018 Survivorship   Survivorship Clinic with NP Jamelle Haring on 05/18/18      CURRENT THERAPY:  Adjuvant Anastrozole daily starting 03/13/18 Fosamax 76m weekly starting in 11/2019   INTERVAL HISTORY: *** Kathleen SCHATZMANis here for a follow up of left breast cancer. She was last seen by me 1 year ago and seen by NP Lacie 5 months ago in interim. She presents to the clinic alone.    REVIEW OF SYSTEMS:  *** Constitutional: Denies fevers, chills or abnormal weight loss Eyes: Denies blurriness of vision Ears, nose, mouth, throat, and face: Denies mucositis or sore throat Respiratory: Denies cough, dyspnea or wheezes Cardiovascular: Denies palpitation, chest discomfort or lower extremity swelling Gastrointestinal:  Denies nausea, heartburn or change in bowel habits Skin: Denies abnormal skin rashes Lymphatics: Denies new lymphadenopathy or easy bruising Neurological:Denies numbness, tingling or new weaknesses Behavioral/Psych: Mood is stable, no new changes  All other systems were reviewed with the patient and are negative.  MEDICAL HISTORY:  Past Medical History:  Diagnosis Date  . Cancer of left breast ([place]   . Constipation   . Family history of breast cancer   . Fibrocystic breast   . Hyperlipidemia   . Ocular migraine    "used to have them monthly for a period of time; seemed to stop; now have had a couple in the last week" (07/13/2017)    SURGICAL HISTORY: Past Surgical  History:  Procedure Laterality Date  . BREAST BIOPSY Right ~ 2001  . BREAST EXCISIONAL BIOPSY Right   . BREAST LUMPECTOMY Left 2001   chemo and radiation  . CESAREAN  SECTION  X 1  . IR CATHETER TUBE CHANGE  07/20/2017  . MASTECTOMY COMPLETE / SIMPLE W/ SENTINEL NODE BIOPSY Left 07/13/2017    LEFT MASTECTOMY WITH SENTINEL NODE MAPPING ERAS PATHWAY [name] 07/13/2017  . MASTECTOMY W/ SENTINEL NODE BIOPSY Left 07/13/2017   Procedure: LEFT MASTECTOMY WITH SENTINEL NODE MAPPING ERAS PATHWAY;  Surgeon: [name], MD;  Location: [facility];  Service: General;  Laterality: Left;  . PORT-A-CATH REMOVAL N/A 05/11/2018   Procedure: REMOVAL PORT-A-CATH;  Surgeon: Stark Klein, MD;  Location: [facility];  Service: General;  Laterality: N/A;  . PORTACATH PLACEMENT Right 07/13/2017  . PORTACATH PLACEMENT Right 07/13/2017   Procedure: INSERTION PORT-A-CATH;  Surgeon: [name], MD;  Location: [facility];  Service: General;  Laterality: Right;  . TONSILLECTOMY      I have reviewed the social history and family history with the patient and they are unchanged from previous note.  ALLERGIES:  has No Known Allergies.  MEDICATIONS:  Current Outpatient Medications  Medication Sig Dispense Refill  . alendronate (FOSAMAX) 70 MG tablet TAKE 1 TABLET BY MOUTH ONCE A WEEK. TAKE WITH A FULL GLASS OF WATER ON AN EMPTY STOMACH. 12 tablet 2  . anastrozole (ARIMIDEX) 1 MG tablet Take 1 tablet (1 mg total) by mouth daily. 90 tablet 3  . calcium citrate (CALCITRATE - DOSED IN MG ELEMENTAL CALCIUM) 950 MG tablet Take 200 mg of elemental calcium by mouth daily.    . Cholecalciferol (VITAMIN D3) 2000 units TABS Take by mouth.    Marland Kitchen HYDROcodone-acetaminophen (NORCO/VICODIN) 5-325 MG tablet Take 1 tablet by mouth every 12 (twelve) hours as needed. 10 tablet 0  . Multiple Vitamin (MULTIVITAMIN WITH MINERALS) TABS tablet Take 1 tablet by mouth daily.    . simvastatin (ZOCOR) 40 MG tablet Take 40 mg daily by mouth.    . vitamin C (ASCORBIC ACID) 500 MG tablet Take 500 mg by mouth daily.     No current facility-administered medications for this visit.    PHYSICAL  EXAMINATION: ECOG PERFORMANCE STATUS: {CHL ONC ECOG PS:[phone]}  There were no vitals filed for this visit. There were no vitals filed for this visit. *** GENERAL:alert, no distress and comfortable SKIN: skin color, texture, turgor are normal, no rashes or significant lesions EYES: normal, Conjunctiva are pink and non-injected, sclera clear {OROPHARYNX:no exudate, no erythema and lips, buccal mucosa, and tongue normal}  NECK: supple, thyroid normal size, non-tender, without nodularity LYMPH:  no palpable lymphadenopathy in the cervical, axillary {or inguinal} LUNGS: clear to auscultation and percussion with normal breathing effort HEART: regular rate & rhythm and no murmurs and no lower extremity edema ABDOMEN:abdomen soft, non-tender and normal bowel sounds Musculoskeletal:no cyanosis of digits and no clubbing  NEURO: alert & oriented x 3 with fluent speech, no focal motor/sensory deficits  LABORATORY DATA:  I have reviewed the data as listed CBC Latest Ref Rng & Units 12/04/2020 08/04/2020 01/30/2020  WBC 4.0 - 10.5 K/uL 12.9(H) 4.8 4.2  Hemoglobin 12.0 - 15.0 g/dL 15.2(H) 13.9 14.5  Hematocrit 36.0 - 46.0 % 46.5(H) 41.9 44.1  Platelets 150 - 400 K/uL 256 212 220     CMP Latest Ref Rng & Units 12/04/2020 08/04/2020 01/30/2020  Glucose 70 - 99 mg/dL 120(H) 76 96  BUN 8 - 23  mg/dL '16 12 12  ' Creatinine 0.44 - 1.00 mg/dL 0.49 0.59 0.64  Sodium 135 - 145 mmol/L 138 137 142  Potassium 3.5 - 5.1 mmol/L 3.5 3.8 4.6  Chloride 98 - 111 mmol/L 99 101 105  CO2 22 - 32 mmol/L '27 30 28  ' Calcium 8.9 - 10.3 mg/dL 9.2 9.3 9.1  Total Protein 6.5 - 8.1 g/dL 7.8 6.8 6.8  Total Bilirubin 0.3 - 1.2 mg/dL 0.8 0.4 0.5  Alkaline Phos 38 - 126 U/L 65 71 87  AST 15 - 41 U/L 49(H) 19 17  ALT 0 - 44 U/L 44 22 17      RADIOGRAPHIC STUDIES: I have personally reviewed the radiological images as listed and agreed with the findings in the report. No results found.   ASSESSMENT & PLAN:  MONESHA MONREAL is a 85 y.o. female with   1. Malignant neoplasm of upper-outer quadrant of left breast in female, invasive ductal carcinoma, pT2N0M0, Stage: 1A, triple positive, Grade 3 -She was diagnosed in 06/2017. She is s/p left mastectomy, adjuvant chemo TCH and maintenance Herceptin.  -Given she had radiation for her prior breast cancer she will foregoadjuvantradiation -She declined oral her2 antibody Neratinib due toconcerns ofside effects.  -She started ani-estrogen therapy with Anastrozole in 02/2018.She is tolerating well overall, will continue for 5-10years. -She is clinically doing well. She does not mild hip ache when walking for the past 2 weeks. I encouraged her to watch this. Lab reviewed, her CBC and CMP are within normal limits. Her physical exam and her 05/2019 mammogram were unremarkable. There is no clinical concern for recurrence. -Continue surveillance. Next mammogram in 06/2020  -Continue Anastrozole  -F/u in 6 months with NP Lacie  -She notes she received both her COVID19 vaccinations.    2.Left arm lymphedema -Negative previous breast surgery, she had physical therapy after surgery, and wears compressionsleeves. Mostly resolved now, remaining in left forearm. -mild, continue monitoring  3. History of invasive and in situ ductal carcinoma of left breast, 10/26/1999, Estrogen receptor negative, stage 1, Grade 3 -Treated in [place]  -s/p Lumpectomy (sentinel node negative, 2-3 removed), chemo, radiation  4.Osteopenia  -10/11/2017 DEXAnormal withlowest T-score of -0.9, which has improved from priorosteopenicscans. Unfortunately her 10/2019 DEXA shows Osteopenia recurrence with lowest T-score at -1.5 at left hip.  -NP Lacie offered bisphosphonate with Zometa but she opted to restart Fosamax 19m weekly in 11/2019. She is tolerating well.  -I strongly encouraged her to continueto take calcium and vitamin D supplement  5. Elevated BP  -Has been high  when she comes to clinic. BP at 165/77 today (01/30/20) -I recommend she check her BP at home weekly and if above 140/90 regularly she should see her PCP about this.    PLAN: -She is clinically doing well  -I refilled Fosamax today -Continue anastrozole -Lab and f/u in 6 months with NP Lacie  -Mammogram in 06/2020   No problem-specific Assessment & Plan notes found for this encounter.   No orders of the defined types were placed in this encounter.  All questions were answered. The patient knows to call the clinic with any problems, questions or concerns. No barriers to learning was detected. The total time spent in the appointment was {CHL ONC TIME VISIT - [id number]}     AJoslyn Devon3/16/2022   IOneal Deputy am acting as scribe for YTruitt Merle MD.   {Add scribe attestation statement}

## 2021-01-09 ENCOUNTER — Telehealth: Payer: Self-pay | Admitting: Hematology

## 2021-01-09 ENCOUNTER — Inpatient Hospital Stay: Payer: Medicare HMO

## 2021-01-09 ENCOUNTER — Inpatient Hospital Stay: Payer: Medicare HMO | Admitting: Hematology

## 2021-01-09 NOTE — Telephone Encounter (Signed)
Scheduled appts per 3/18 sch msg. Pt aware.

## 2021-02-02 ENCOUNTER — Other Ambulatory Visit: Payer: Medicare HMO

## 2021-02-02 ENCOUNTER — Ambulatory Visit: Payer: Medicare HMO | Admitting: Hematology

## 2021-02-05 ENCOUNTER — Inpatient Hospital Stay (HOSPITAL_BASED_OUTPATIENT_CLINIC_OR_DEPARTMENT_OTHER): Payer: Medicare HMO | Admitting: Hematology

## 2021-02-05 ENCOUNTER — Other Ambulatory Visit: Payer: Self-pay

## 2021-02-05 ENCOUNTER — Inpatient Hospital Stay: Payer: Medicare HMO | Attending: Hematology

## 2021-02-05 VITALS — BP 191/90 | HR 74 | Temp 98.9°F | Resp 19 | Ht 64.0 in | Wt 132.5 lb

## 2021-02-05 DIAGNOSIS — C50412 Malignant neoplasm of upper-outer quadrant of left female breast: Secondary | ICD-10-CM

## 2021-02-05 DIAGNOSIS — I890 Lymphedema, not elsewhere classified: Secondary | ICD-10-CM | POA: Diagnosis not present

## 2021-02-05 DIAGNOSIS — Z170 Estrogen receptor positive status [ER+]: Secondary | ICD-10-CM

## 2021-02-05 DIAGNOSIS — M85852 Other specified disorders of bone density and structure, left thigh: Secondary | ICD-10-CM | POA: Diagnosis not present

## 2021-02-05 DIAGNOSIS — Z1231 Encounter for screening mammogram for malignant neoplasm of breast: Secondary | ICD-10-CM

## 2021-02-05 DIAGNOSIS — Z1321 Encounter for screening for nutritional disorder: Secondary | ICD-10-CM

## 2021-02-05 DIAGNOSIS — Z923 Personal history of irradiation: Secondary | ICD-10-CM | POA: Diagnosis not present

## 2021-02-05 DIAGNOSIS — Z853 Personal history of malignant neoplasm of breast: Secondary | ICD-10-CM | POA: Diagnosis not present

## 2021-02-05 DIAGNOSIS — R030 Elevated blood-pressure reading, without diagnosis of hypertension: Secondary | ICD-10-CM | POA: Insufficient documentation

## 2021-02-05 DIAGNOSIS — Z79811 Long term (current) use of aromatase inhibitors: Secondary | ICD-10-CM | POA: Diagnosis not present

## 2021-02-05 DIAGNOSIS — R911 Solitary pulmonary nodule: Secondary | ICD-10-CM | POA: Insufficient documentation

## 2021-02-05 DIAGNOSIS — M79622 Pain in left upper arm: Secondary | ICD-10-CM | POA: Insufficient documentation

## 2021-02-05 DIAGNOSIS — Z9012 Acquired absence of left breast and nipple: Secondary | ICD-10-CM | POA: Insufficient documentation

## 2021-02-05 LAB — CBC WITH DIFFERENTIAL/PLATELET
Abs Immature Granulocytes: 0.01 10*3/uL (ref 0.00–0.07)
Basophils Absolute: 0 10*3/uL (ref 0.0–0.1)
Basophils Relative: 0 %
Eosinophils Absolute: 0.2 10*3/uL (ref 0.0–0.5)
Eosinophils Relative: 4 %
HCT: 43.1 % (ref 36.0–46.0)
Hemoglobin: 14.2 g/dL (ref 12.0–15.0)
Immature Granulocytes: 0 %
Lymphocytes Relative: 17 %
Lymphs Abs: 0.9 10*3/uL (ref 0.7–4.0)
MCH: 31.2 pg (ref 26.0–34.0)
MCHC: 32.9 g/dL (ref 30.0–36.0)
MCV: 94.7 fL (ref 80.0–100.0)
Monocytes Absolute: 0.4 10*3/uL (ref 0.1–1.0)
Monocytes Relative: 9 %
Neutro Abs: 3.4 10*3/uL (ref 1.7–7.7)
Neutrophils Relative %: 70 %
Platelets: 241 10*3/uL (ref 150–400)
RBC: 4.55 MIL/uL (ref 3.87–5.11)
RDW: 13 % (ref 11.5–15.5)
WBC: 5 10*3/uL (ref 4.0–10.5)
nRBC: 0 % (ref 0.0–0.2)

## 2021-02-05 LAB — COMPREHENSIVE METABOLIC PANEL
ALT: 17 U/L (ref 0–44)
AST: 17 U/L (ref 15–41)
Albumin: 4 g/dL (ref 3.5–5.0)
Alkaline Phosphatase: 76 U/L (ref 38–126)
Anion gap: 9 (ref 5–15)
BUN: 11 mg/dL (ref 8–23)
CO2: 28 mmol/L (ref 22–32)
Calcium: 9.2 mg/dL (ref 8.9–10.3)
Chloride: 102 mmol/L (ref 98–111)
Creatinine, Ser: 0.58 mg/dL (ref 0.44–1.00)
GFR, Estimated: >60 mL/min (ref >60)
Glucose, Bld: 91 mg/dL (ref 70–99)
Potassium: 4.6 mmol/L (ref 3.5–5.1)
Sodium: 139 mmol/L (ref 135–145)
Total Bilirubin: 0.5 mg/dL (ref 0.3–1.2)
Total Protein: 7 g/dL (ref 6.5–8.1)

## 2021-02-05 LAB — VITAMIN D 25 HYDROXY (VIT D DEFICIENCY, FRACTURES): Vit D, 25-Hydroxy: 31.11 ng/mL (ref 30–100)
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
## 2021-02-12 ENCOUNTER — Encounter: Payer: Self-pay | Admitting: Hematology

## 2021-02-25 ENCOUNTER — Other Ambulatory Visit: Payer: Self-pay

## 2021-02-25 ENCOUNTER — Ambulatory Visit: Payer: Medicare HMO | Attending: Hematology

## 2021-02-25 DIAGNOSIS — R293 Abnormal posture: Secondary | ICD-10-CM | POA: Insufficient documentation

## 2021-02-25 DIAGNOSIS — Z483 Aftercare following surgery for neoplasm: Secondary | ICD-10-CM | POA: Insufficient documentation

## 2021-02-25 DIAGNOSIS — M25512 Pain in left shoulder: Secondary | ICD-10-CM | POA: Diagnosis present

## 2021-02-25 DIAGNOSIS — M6281 Muscle weakness (generalized): Secondary | ICD-10-CM | POA: Diagnosis present

## 2021-02-25 DIAGNOSIS — I890 Lymphedema, not elsewhere classified: Secondary | ICD-10-CM | POA: Diagnosis present
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
## 2021-03-03 ENCOUNTER — Other Ambulatory Visit: Payer: Self-pay

## 2021-03-03 NOTE — Telephone Encounter (Signed)
Incoming fax with request to refill Alendronate with [facility].

## 2021-03-09 ENCOUNTER — Ambulatory Visit: Payer: Medicare HMO

## 2021-03-09 ENCOUNTER — Other Ambulatory Visit: Payer: Self-pay

## 2021-03-09 DIAGNOSIS — R293 Abnormal posture: Secondary | ICD-10-CM

## 2021-03-09 DIAGNOSIS — I890 Lymphedema, not elsewhere classified: Secondary | ICD-10-CM

## 2021-03-09 DIAGNOSIS — M25512 Pain in left shoulder: Secondary | ICD-10-CM

## 2021-03-09 DIAGNOSIS — M6281 Muscle weakness (generalized): Secondary | ICD-10-CM

## 2021-03-09 DIAGNOSIS — Z483 Aftercare following surgery for neoplasm: Secondary | ICD-10-CM

## 2021-03-09 NOTE — Therapy (Signed)
[place], [place], [address] Phone: [phone]   Fax:  [phone]  Physical Therapy Treatment  Patient Details  Name: Kathleen Hanson MRN: [id number] Date of Birth: 1936-05-21 Referring Provider (PT): Dr. Burr Medico   Encounter Date: 03/09/2021   PT End of Session - 03/09/21 1031    Visit Number 2    Number of Visits 18    Date for PT Re-Evaluation 04/08/21    PT Start Time 0912    PT Stop Time 0958    PT Time Calculation (min) 46 min    Activity Tolerance Patient tolerated treatment well    Behavior During Therapy Kathleen Hanson for tasks assessed/performed           Past Medical History:  Diagnosis Date  . Cancer of left breast ([facility])   . Constipation   . Family history of breast cancer   . Fibrocystic breast   . Hyperlipidemia   . Ocular migraine    "used to have them monthly for a period of time; seemed to stop; now have had a couple in the last week" (07/13/2017)    Past Surgical History:  Procedure Laterality Date  . BREAST BIOPSY Right ~ 2001  . BREAST EXCISIONAL BIOPSY Right   . BREAST LUMPECTOMY Left 2001   chemo and radiation  . CESAREAN SECTION  X 1  . IR CATHETER TUBE CHANGE  07/20/2017  . MASTECTOMY COMPLETE / SIMPLE W/ SENTINEL NODE BIOPSY Left 07/13/2017    LEFT MASTECTOMY WITH SENTINEL NODE MAPPING ERAS PATHWAY Kathleen Hanson 07/13/2017  . MASTECTOMY W/ SENTINEL NODE BIOPSY Left 07/13/2017   Procedure: LEFT MASTECTOMY WITH SENTINEL NODE MAPPING ERAS PATHWAY;  Surgeon: [name], MD;  Location: [facility];  Service: General;  Laterality: Left;  . PORT-A-CATH REMOVAL N/A 05/11/2018   Procedure: REMOVAL PORT-A-CATH;  Surgeon: Stark Klein, MD;  Location: [facility];  Service: General;  Laterality: N/A;  . PORTACATH PLACEMENT Right 07/13/2017  . PORTACATH PLACEMENT Right 07/13/2017   Procedure: INSERTION PORT-A-CATH;  Surgeon: [name], MD;  Location: [facility];  Service: General;   Laterality: Right;  . TONSILLECTOMY      There were no vitals filed for this visit.   Subjective Assessment - 03/09/21 0911    Subjective Pt reports continued swelling in left UE and intermittent popping in left shoulder.  right shoulder pain has resolved. She has tried wearing her old sleeve several times but is it always slides down.   Pertinent History Pt had a left breast lumpectomy in 2001 with chemo and radiation, and a left breast mastectomy on 07/13/2017.  She had 2 recent falls in the home.  She is complaining of leg weakness, left shoulder pain and is having increased swelling in left UE    Patient Stated Goals Decrease swelling, left shoulder pain, increase leg strength    Currently in Pain? No/denies    Pain Score 0-No pain                     Flowsheet Row Outpatient Rehab from 11/24/2018 in [facility]  Lymphedema Life Impact Scale Total Score 2.94 %            [facility] Adult PT Treatment/Exercise - 03/09/21 0001      Manual Therapy   Manual Lymphatic Drainage (MLD) short neck, right axillary LN, left inguinal LN, anterior interaxillary pathway, left axillo-inguinal pathway, left UE starting proximally and working distally repeating  all pathways.    Compression Bandaging Small TG soft cut from thumb to axilla, 6 cm artiflext applied.  Hand wrapped with 6 cm wrap, and 10 cm wrap wrist to axilla with TG soft folded over the top.  Pt removed all jewelry prior to wrapping left UE. Pt advised to remove wrap if having pain, numbness, finger swelling or discomfort.                 PT Education - 03/09/21 1030    Education Details Pt educated to remove wraps if having, pain, numbness or discomfort that does not allow her to sleep    Person(s) Educated Patient    Methods Explanation    Comprehension Verbalized understanding            PT Short Term Goals - 02/25/21 1322      PT SHORT TERM GOAL #1   Title Pt will be  independent and compliant with HEP for left shoulder ROM/strength    Time 4    Period Weeks    Status New    Target Date 03/25/21      PT SHORT TERM GOAL #2   Title Decreased left shoulder pain by 25% or greater    Time 4    Period Weeks    Status New    Target Date 03/25/21             PT Long Term Goals - 02/25/21 1324      PT LONG TERM GOAL #1   Title Pt will be independent in self MLD technique for maximal reduction of lymphedema    Time 4    Period Weeks    Status New    Target Date 03/25/21      PT LONG TERM GOAL #2   Title Pt will have decreased edema by 1 cm or greater in left forearm    Time 6    Period Weeks    Status New    Target Date 04/08/21      PT LONG TERM GOAL #3   Title Pt will have decreased left shoulder pain by 50% or greater    Time 6    Period Weeks    Status New    Target Date 04/08/21      PT LONG TERM GOAL #4   Title Pt will decrease quick DASH score to < 12%0 indicating an improvement in functional use of left arm    Baseline 25%    Time 6    Period Weeks    Status New    Target Date 04/08/21      PT LONG TERM GOAL #5   Title Pt will be fit for new compression garment and be independent in its proper use    Time 6    Period Weeks    Status New    Target Date 04/08/21      Additional Long Term Goals   Additional Long Term Goals Yes      PT LONG TERM GOAL #6   Title Pt will have 30 sec sit to stand test of 12 indicating improved function/strength in the LE's    Baseline 7    Time 6    Period Weeks    Status New    Target Date 04/08/21                 Plan - 03/09/21 1104    Clinical Impression Statement Therapy consisted of MLD techniques for left  UE lymphedema with pt education while performing, and compression bandaging with TG soft, artiflex, 1 6cm hand wrap and 1 10 cm wrap from wrist to axilla. She performed exercises to loosen wraps.  She was given info to remove wraps if having pain, numbness, or tingling  and if she is not sure she should remove wraps.  We discussed having her husband come to learn wrapping, but she doesn't think he would like the idea.  Will need to practice with her next visit.    Personal Factors and Comorbidities Comorbidity 2;Age    Comorbidities recurrent breast cancer, chemo, radiation, recent falls, impaired hearing    Examination-Activity Limitations Reach Overhead    Stability/Clinical Decision Making Stable/Uncomplicated    Rehab Potential Good    PT Frequency 3x / week    PT Duration 6 weeks    PT Treatment/Interventions Therapeutic exercise;Balance training;Manual techniques;Patient/family education;Manual lymph drainage;Compression bandaging;Passive range of motion    PT Next Visit Plan assess bandaging and how she did, continue MLD and bandaging, fit for sleeve when ready, instruct pt bandaging, progress to postural and left shoulder strength, LE strength    Consulted and Agree with Plan of Care Patient           Patient will benefit from skilled therapeutic intervention in order to improve the following deficits and impairments:  Decreased range of motion,Impaired UE functional use,Pain,Decreased knowledge of precautions,Increased edema,Postural dysfunction  Visit Diagnosis: Abnormal posture  Lymphedema, not elsewhere classified  Muscle weakness (generalized)  Aftercare following surgery for neoplasm  Left shoulder pain, unspecified chronicity     Problem List Patient Active Problem List   Diagnosis Date Noted  . Lower extremity cellulitis 12/07/2017  . Genetic testing 08/22/2017  . Family history of breast cancer   . Pneumothorax on right 07/18/2017  . Malignant neoplasm of upper-outer quadrant of left breast in female, estrogen receptor positive ([facility]) 06/30/2017    Claris Pong 03/09/2021, 12:11 PM  [place], [place], [address] Phone: [phone]   Fax:   [phone]  Name: LEATHER ESTIS MRN: [id number] Date of Birth: 1936/09/10  Cheral Almas, PT 03/09/21 12:14 PM

## 2021-03-09 NOTE — Patient Instructions (Signed)

## 2021-03-11 ENCOUNTER — Ambulatory Visit: Payer: Medicare HMO

## 2021-03-11 ENCOUNTER — Ambulatory Visit: Payer: Medicare HMO | Admitting: Physician Assistant

## 2021-03-11 ENCOUNTER — Other Ambulatory Visit: Payer: Self-pay

## 2021-03-11 ENCOUNTER — Encounter: Payer: Medicare HMO | Admitting: Physical Therapy

## 2021-03-11 DIAGNOSIS — I890 Lymphedema, not elsewhere classified: Secondary | ICD-10-CM

## 2021-03-11 DIAGNOSIS — Z483 Aftercare following surgery for neoplasm: Secondary | ICD-10-CM

## 2021-03-11 DIAGNOSIS — M25512 Pain in left shoulder: Secondary | ICD-10-CM

## 2021-03-11 DIAGNOSIS — M6281 Muscle weakness (generalized): Secondary | ICD-10-CM

## 2021-03-11 DIAGNOSIS — R293 Abnormal posture: Secondary | ICD-10-CM

## 2021-03-11 MED ORDER — ALENDRONATE SODIUM 70 MG PO TABS: ORAL_TABLET | ORAL | 2 refills | Status: DC

## 2021-03-11 NOTE — Therapy (Addendum)
[place], [place], [address] Phone: [phone]   Fax:  [phone]  Physical Therapy Treatment  Patient Details  Name: Kathleen Hanson MRN: [id number] Date of Birth: 1936/01/11 Referring Provider (PT): Dr. Burr Medico   Encounter Date: 03/11/2021   PT End of Session - 03/11/21 1005    Visit Number 3    Number of Visits 18    Date for PT Re-Evaluation 04/08/21    PT Start Time 0808   pt arrived late   PT Stop Time 0908    PT Time Calculation (min) 60 min    Activity Tolerance Patient tolerated treatment well    Behavior During Therapy [facility] for tasks assessed/performed           Past Medical History:  Diagnosis Date  . Cancer of left breast ([place])   . Constipation   . Family history of breast cancer   . Fibrocystic breast   . Hyperlipidemia   . Ocular migraine    "used to have them monthly for a period of time; seemed to stop; now have had a couple in the last week" (07/13/2017)    Past Surgical History:  Procedure Laterality Date  . BREAST BIOPSY Right ~ 2001  . BREAST EXCISIONAL BIOPSY Right   . BREAST LUMPECTOMY Left 2001   chemo and radiation  . CESAREAN SECTION  X 1  . IR CATHETER TUBE CHANGE  07/20/2017  . MASTECTOMY COMPLETE / SIMPLE W/ SENTINEL NODE BIOPSY Left 07/13/2017    LEFT MASTECTOMY WITH SENTINEL NODE MAPPING ERAS PATHWAY [name] 07/13/2017  . MASTECTOMY W/ SENTINEL NODE BIOPSY Left 07/13/2017   Procedure: LEFT MASTECTOMY WITH SENTINEL NODE MAPPING ERAS PATHWAY;  Surgeon: [name], MD;  Location: [place];  Service: General;  Laterality: Left;  . PORT-A-CATH REMOVAL N/A 05/11/2018   Procedure: REMOVAL PORT-A-CATH;  Surgeon: Stark Klein, MD;  Location: [place];  Service: General;  Laterality: N/A;  . PORTACATH PLACEMENT Right 07/13/2017  . PORTACATH PLACEMENT Right 07/13/2017   Procedure: INSERTION PORT-A-CATH;  Surgeon: [name], MD;  Location: [place];   Service: General;  Laterality: Right;  . TONSILLECTOMY      There were no vitals filed for this visit.   Subjective Assessment - 03/11/21 0809    Subjective I left the bandage on but the area by my thumb (points to webspace) was really tneder and I started having some itching at my upper arm. But overall it was okay.    Pertinent History Pt had a left breast lumpectomy in 2001 with chemo and radiation, and a left breast mastectomy on 07/13/2017.  She had 2 recent falls in the home.  She is complaining of leg weakness, left shoulder pain and is having increased swelling in left UE    Patient Stated Goals Decrease swelling, left shoulder pain, increase leg strength    Currently in Pain? No/denies                 LYMPHEDEMA/ONCOLOGY QUESTIONNAIRE - 03/11/21 0001      Left Upper Extremity Lymphedema   15 cm Proximal to Olecranon Process 26.2 cm    10 cm Proximal to Olecranon Process 23.2 cm    Olecranon Process 23.1 cm    15 cm Proximal to Ulnar Styloid Process 21.9 cm    10 cm Proximal to Ulnar Styloid Process 19 cm    Just Proximal to Ulnar Styloid Process 13.5 cm    Across  Hand at [employer] 16.6 cm    At [place] of 2nd Digit 5.9 cm              Flowsheet Row Outpatient Rehab from 11/24/2018 in [place]  Lymphedema Life Impact Scale Total Score 2.94 %           Compression bandaging to Lt UE was instructed to pt today with therapist applying, removing, and then having pt applying as follows: Cocoa butter, thick stockinette, small piece of 1/2" gray foam at thumb webspace, artiflex x1 from hand to axilla, 1-6 cm from hand to forearm and 1-10 cm short stretch compression bandage from wrist to axilla. Educated pt in sequence and proper amount of tension. Issued handout for this as well.      Education: [employer] and offered verbal and tactile cues. Pt will need further review.       PT Short Term Goals - 02/25/21 1322       PT SHORT TERM GOAL #1   Title Pt will be independent and compliant with HEP for left shoulder ROM/strength    Time 4    Period Weeks    Status New    Target Date 03/25/21      PT SHORT TERM GOAL #2   Title Decreased left shoulder pain by 25% or greater    Time 4    Period Weeks    Status New    Target Date 03/25/21             PT Long Term Goals - 02/25/21 1324      PT LONG TERM GOAL #1   Title Pt will be independent in self MLD technique for maximal reduction of lymphedema    Time 4    Period Weeks    Status New    Target Date 03/25/21      PT LONG TERM GOAL #2   Title Pt will have decreased edema by 1 cm or greater in left forearm    Time 6    Period Weeks    Status New    Target Date 04/08/21      PT LONG TERM GOAL #3   Title Pt will have decreased left shoulder pain by 50% or greater    Time 6    Period Weeks    Status New    Target Date 04/08/21      PT LONG TERM GOAL #4   Title Pt will decrease quick DASH score to < 12%0 indicating an improvement in functional use of left arm    Baseline 25%    Time 6    Period Weeks    Status New    Target Date 04/08/21      PT LONG TERM GOAL #5   Title Pt will be fit for new compression garment and be independent in its proper use    Time 6    Period Weeks    Status New    Target Date 04/08/21      Additional Long Term Goals   Additional Long Term Goals Yes      PT LONG TERM GOAL #6   Title Pt will have 30 sec sit to stand test of 12 indicating improved function/strength in the LE's    Baseline 7    Time 6    Period Weeks    Status New    Target Date 04/08/21  Plan - 03/11/21 1246    Clinical Impression Statement Spent entire session instructing pt in proper application of compression bandages having her return demo of each step multiple times. Pt was able to return good demo with correct pressure by end of session but will benefit from further review of this.    Personal  Factors and Comorbidities Comorbidity 2;Age    Comorbidities recurrent breast cancer, chemo, radiation, recent falls, impaired hearing    Examination-Activity Limitations Reach Overhead    Stability/Clinical Decision Making Stable/Uncomplicated    Rehab Potential Good    PT Frequency 3x / week    PT Duration 6 weeks    PT Treatment/Interventions Therapeutic exercise;Balance training;Manual techniques;Patient/family education;Manual lymph drainage;Compression bandaging;Passive range of motion    PT Next Visit Plan assess bandaging and how she did, con tinstruction of this, continue MLD and bandaging, fit for sleeve when ready, progress to postural and left shoulder strength, LE strength    PT Home Exercise Plan Try self compression bandaging    Consulted and Agree with Plan of Care Patient           Patient will benefit from skilled therapeutic intervention in order to improve the following deficits and impairments:  Decreased range of motion,Impaired UE functional use,Pain,Decreased knowledge of precautions,Increased edema,Postural dysfunction  Visit Diagnosis: Abnormal posture  Lymphedema, not elsewhere classified  Muscle weakness (generalized)  Aftercare following surgery for neoplasm  Left shoulder pain, unspecified chronicity     Problem List Patient Active Problem List   Diagnosis Date Noted  . Lower extremity cellulitis 12/07/2017  . Genetic testing 08/22/2017  . Family history of breast cancer   . Pneumothorax on right 07/18/2017  . Malignant neoplasm of upper-outer quadrant of left breast in female, estrogen receptor positive ([place]) 06/30/2017    Otelia Limes, PTA 03/11/2021, 12:50 PM  [place] [facility] [place], [place], [address] Phone: [phone]   Fax:  [phone]  Name: Kathleen Hanson MRN: [id number] Date of Birth: 1936/01/10

## 2021-03-13 ENCOUNTER — Ambulatory Visit: Payer: Medicare HMO

## 2021-03-13 ENCOUNTER — Other Ambulatory Visit: Payer: Self-pay

## 2021-03-13 DIAGNOSIS — R293 Abnormal posture: Secondary | ICD-10-CM

## 2021-03-13 DIAGNOSIS — M25512 Pain in left shoulder: Secondary | ICD-10-CM

## 2021-03-13 DIAGNOSIS — I890 Lymphedema, not elsewhere classified: Secondary | ICD-10-CM

## 2021-03-13 DIAGNOSIS — Z483 Aftercare following surgery for neoplasm: Secondary | ICD-10-CM

## 2021-03-13 DIAGNOSIS — M6281 Muscle weakness (generalized): Secondary | ICD-10-CM

## 2021-03-13 NOTE — Therapy (Signed)
[place], [place], [address] Phone: [phone]   Fax:  [phone]  Physical Therapy Treatment  Patient Details  Name: Kathleen Hanson MRN: [id number] Date of Birth: 1935-12-17 Referring Provider (PT): Dr. Burr Medico   Encounter Date: 03/13/2021   PT End of Session - 03/13/21 1105    Visit Number 4    Number of Visits 18    Date for PT Re-Evaluation 04/08/21    PT Start Time 1010    PT Stop Time 1104    PT Time Calculation (min) 54 min    Activity Tolerance Patient tolerated treatment well    Behavior During Therapy [facility] for tasks assessed/performed           Past Medical History:  Diagnosis Date  . Cancer of left breast ([place])   . Constipation   . Family history of breast cancer   . Fibrocystic breast   . Hyperlipidemia   . Ocular migraine    "used to have them monthly for a period of time; seemed to stop; now have had a couple in the last week" (07/13/2017)    Past Surgical History:  Procedure Laterality Date  . BREAST BIOPSY Right ~ 2001  . BREAST EXCISIONAL BIOPSY Right   . BREAST LUMPECTOMY Left 2001   chemo and radiation  . CESAREAN SECTION  X 1  . IR CATHETER TUBE CHANGE  07/20/2017  . MASTECTOMY COMPLETE / SIMPLE W/ SENTINEL NODE BIOPSY Left 07/13/2017    LEFT MASTECTOMY WITH SENTINEL NODE MAPPING ERAS PATHWAY Kathleen Hanson 07/13/2017  . MASTECTOMY W/ SENTINEL NODE BIOPSY Left 07/13/2017   Procedure: LEFT MASTECTOMY WITH SENTINEL NODE MAPPING ERAS PATHWAY;  Surgeon: [name], MD;  Location: [place];  Service: General;  Laterality: Left;  . PORT-A-CATH REMOVAL N/A 05/11/2018   Procedure: REMOVAL PORT-A-CATH;  Surgeon: Stark Klein, MD;  Location: [place];  Service: General;  Laterality: N/A;  . PORTACATH PLACEMENT Right 07/13/2017  . PORTACATH PLACEMENT Right 07/13/2017   Procedure: INSERTION PORT-A-CATH;  Surgeon: [name], MD;  Location: [place];  Service: General;   Laterality: Right;  . TONSILLECTOMY      There were no vitals filed for this visit.   Subjective Assessment - 03/13/21 1021    Subjective I rewrapped my arm and need you to check it. The foam at my thumb webspace helped the soreness I was having from the bandage.    Pertinent History Pt had a left breast lumpectomy in 2001 with chemo and radiation, and a left breast mastectomy on 07/13/2017.  She had 2 recent falls in the home.  She is complaining of leg weakness, left shoulder pain and is having increased swelling in left UE    Patient Stated Goals Decrease swelling, left shoulder pain, increase leg strength    Currently in Pain? No/denies                 LYMPHEDEMA/ONCOLOGY QUESTIONNAIRE - 03/13/21 0001      Left Upper Extremity Lymphedema   15 cm Proximal to Ulnar Styloid Process 21.5 cm    10 cm Proximal to Ulnar Styloid Process 18.2 cm              Flowsheet Row [facility]  Lymphedema Life Impact Scale Total Score 2.94 %            [facility] Adult PT Treatment/Exercise - 03/13/21 0001  Manual Therapy   Compression Bandaging Small TG soft cut from thumb to axilla and small piece 1/2" gray foam placed over thumb webspace, then Artiflex x1 from hand to axilla, Hand and forearm wrapped with 6 cm wrap, and 10 cm wrap wrist to axilla with TG soft folded over the top. Reviewed this with pt with therapist performing and verablly reviewing and then had pt reapply offering cuing throughout for correct technique and pressure.                    PT Short Term Goals - 02/25/21 1322      PT SHORT TERM GOAL #1   Title Pt will be independent and compliant with HEP for left shoulder ROM/strength    Time 4    Period Weeks    Status New    Target Date 03/25/21      PT SHORT TERM GOAL #2   Title Decreased left shoulder pain by 25% or greater    Time 4    Period Weeks    Status New     Target Date 03/25/21             PT Long Term Goals - 02/25/21 1324      PT LONG TERM GOAL #1   Title Pt will be independent in self MLD technique for maximal reduction of lymphedema    Time 4    Period Weeks    Status New    Target Date 03/25/21      PT LONG TERM GOAL #2   Title Pt will have decreased edema by 1 cm or greater in left forearm    Time 6    Period Weeks    Status New    Target Date 04/08/21      PT LONG TERM GOAL #3   Title Pt will have decreased left shoulder pain by 50% or greater    Time 6    Period Weeks    Status New    Target Date 04/08/21      PT LONG TERM GOAL #4   Title Pt will decrease quick DASH score to < 12%0 indicating an improvement in functional use of left arm    Baseline 25%    Time 6    Period Weeks    Status New    Target Date 04/08/21      PT LONG TERM GOAL #5   Title Pt will be fit for new compression garment and be independent in its proper use    Time 6    Period Weeks    Status New    Target Date 04/08/21      Additional Long Term Goals   Additional Long Term Goals Yes      PT LONG TERM GOAL #6   Title Pt will have 30 sec sit to stand test of 12 indicating improved function/strength in the LE's    Baseline 7    Time 6    Period Weeks    Status New    Target Date 04/08/21                 Plan - 03/13/21 1109    Clinical Impression Statement Continued review of self application of compression bandaging. Pt came in with bandages on she had donned yesterday. She had very little revolutions at the hand so reviewed importance of compression needing to start here to push fluid up arm and to prevent backflow into hand. Reviewed  by applying bandages and verbally reviewing technique, then removed bandages and had pt apply them offering cuing throughout. She was able to better verbalize understanding of correct sequence and tension.    Personal Factors and Comorbidities Comorbidity 2;Age    Comorbidities recurrent breast  cancer, chemo, radiation, recent falls, impaired hearing    Examination-Activity Limitations Reach Overhead    Stability/Clinical Decision Making Stable/Uncomplicated    Rehab Potential Good    PT Frequency 3x / week    PT Duration 6 weeks    PT Treatment/Interventions Therapeutic exercise;Balance training;Manual techniques;Patient/family education;Manual lymph drainage;Compression bandaging;Passive range of motion    PT Next Visit Plan assess bandaging and how she did, cont instruction of this, continue MLD and bandaging, fit for sleeve when ready, progress to postural and left shoulder strength, LE strength    PT Home Exercise Plan Try self compression bandaging    Consulted and Agree with Plan of Care Patient           Patient will benefit from skilled therapeutic intervention in order to improve the following deficits and impairments:  Decreased range of motion,Impaired UE functional use,Pain,Decreased knowledge of precautions,Increased edema,Postural dysfunction  Visit Diagnosis: Abnormal posture  Lymphedema, not elsewhere classified  Muscle weakness (generalized)  Aftercare following surgery for neoplasm  Left shoulder pain, unspecified chronicity     Problem List Patient Active Problem List   Diagnosis Date Noted  . Lower extremity cellulitis 12/07/2017  . Genetic testing 08/22/2017  . Family history of breast cancer   . Pneumothorax on right 07/18/2017  . Malignant neoplasm of upper-outer quadrant of left breast in female, estrogen receptor positive ([place]) 06/30/2017    Kathleen Hanson, PTA 03/13/2021, 11:18 AM  [place], [place], [address] Phone: [phone]   Fax:  [phone]  Name: Kathleen Hanson MRN: [id number] Date of Birth: September 12, 1936

## 2021-03-18 ENCOUNTER — Other Ambulatory Visit: Payer: Self-pay

## 2021-03-18 ENCOUNTER — Encounter: Payer: Self-pay | Admitting: Physical Therapy

## 2021-03-18 ENCOUNTER — Ambulatory Visit: Payer: Medicare HMO | Admitting: Physical Therapy

## 2021-03-18 DIAGNOSIS — I890 Lymphedema, not elsewhere classified: Secondary | ICD-10-CM

## 2021-03-18 DIAGNOSIS — R293 Abnormal posture: Secondary | ICD-10-CM | POA: Diagnosis not present

## 2021-03-18 NOTE — Therapy (Signed)
[place], [place], [address] Phone: [phone]   Fax:  [phone]  Physical Therapy Treatment  Patient Details  Name: Kathleen Hanson MRN: [id number] Date of Birth: Jan 19, 1936 Referring Provider (PT): Dr. Burr Medico   Encounter Date: 03/18/2021   PT End of Session - 03/18/21 1100    Visit Number 5    Number of Visits 18    Date for PT Re-Evaluation 04/08/21    PT Start Time 1010   pt arrived late   PT Stop Time 1053    PT Time Calculation (min) 43 min    Activity Tolerance Patient tolerated treatment well    Behavior During Therapy [facility] for tasks assessed/performed           Past Medical History:  Diagnosis Date  . Cancer of left breast ([place])   . Constipation   . Family history of breast cancer   . Fibrocystic breast   . Hyperlipidemia   . Ocular migraine    "used to have them monthly for a period of time; seemed to stop; now have had a couple in the last week" (07/13/2017)    Past Surgical History:  Procedure Laterality Date  . BREAST BIOPSY Right ~ 2001  . BREAST EXCISIONAL BIOPSY Right   . BREAST LUMPECTOMY Left 2001   chemo and radiation  . CESAREAN SECTION  X 1  . IR CATHETER TUBE CHANGE  07/20/2017  . MASTECTOMY COMPLETE / SIMPLE W/ SENTINEL NODE BIOPSY Left 07/13/2017    LEFT MASTECTOMY WITH SENTINEL NODE MAPPING ERAS PATHWAY [name] 07/13/2017  . MASTECTOMY W/ SENTINEL NODE BIOPSY Left 07/13/2017   Procedure: LEFT MASTECTOMY WITH SENTINEL NODE MAPPING ERAS PATHWAY;  Surgeon: [name], MD;  Location: [place];  Service: General;  Laterality: Left;  . PORT-A-CATH REMOVAL N/A 05/11/2018   Procedure: REMOVAL PORT-A-CATH;  Surgeon: Stark Klein, MD;  Location: [place];  Service: General;  Laterality: N/A;  . PORTACATH PLACEMENT Right 07/13/2017  . PORTACATH PLACEMENT Right 07/13/2017   Procedure: INSERTION PORT-A-CATH;  Surgeon: [name], MD;  Location: [place];   Service: General;  Laterality: Right;  . TONSILLECTOMY      There were no vitals filed for this visit.   Subjective Assessment - 03/18/21 1219    Subjective I can't keep coming for appointments because my husband was diagnosed with cancer.    Pertinent History Pt had a left breast lumpectomy in 2001 with chemo and radiation, and a left breast mastectomy on 07/13/2017.  She had 2 recent falls in the home.  She is complaining of leg weakness, left shoulder pain and is having increased swelling in left UE    Patient Stated Goals Decrease swelling, left shoulder pain, increase leg strength    Currently in Pain? No/denies    Pain Score 0-No pain                     Flowsheet Row Outpatient Rehab from 11/24/2018 in [place]  Lymphedema Life Impact Scale Total Score 2.94 %            [facility] Adult PT Treatment/Exercise - 03/18/21 0001      Manual Therapy   Manual Therapy Edema management    Edema Management issued script and alight form to pt to go to [facility] and obtain daytime and night time garment    Compression Bandaging Small TG soft cut from thumb to axilla  and small piece 1/2" gray foam placed over thumb webspace, then Artiflex x1 from hand to axilla, Hand and forearm wrapped with 6 cm wrap, and 10 cm wrap wrist to axilla with TG soft folded over the top. Reviewed this with pt and had pt apply compression bandages while therapist provided verbal feedback                    PT Short Term Goals - 02/25/21 1322      PT SHORT TERM GOAL #1   Title Pt will be independent and compliant with HEP for left shoulder ROM/strength    Time 4    Period Weeks    Status New    Target Date 03/25/21      PT SHORT TERM GOAL #2   Title Decreased left shoulder pain by 25% or greater    Time 4    Period Weeks    Status New    Target Date 03/25/21             PT Long Term Goals - 02/25/21 1324      PT LONG TERM GOAL #1    Title Pt will be independent in self MLD technique for maximal reduction of lymphedema    Time 4    Period Weeks    Status New    Target Date 03/25/21      PT LONG TERM GOAL #2   Title Pt will have decreased edema by 1 cm or greater in left forearm    Time 6    Period Weeks    Status New    Target Date 04/08/21      PT LONG TERM GOAL #3   Title Pt will have decreased left shoulder pain by 50% or greater    Time 6    Period Weeks    Status New    Target Date 04/08/21      PT LONG TERM GOAL #4   Title Pt will decrease quick DASH score to < 12%0 indicating an improvement in functional use of left arm    Baseline 25%    Time 6    Period Weeks    Status New    Target Date 04/08/21      PT LONG TERM GOAL #5   Title Pt will be fit for new compression garment and be independent in its proper use    Time 6    Period Weeks    Status New    Target Date 04/08/21      Additional Long Term Goals   Additional Long Term Goals Yes      PT LONG TERM GOAL #6   Title Pt will have 30 sec sit to stand test of 12 indicating improved function/strength in the LE's    Baseline 7    Time 6    Period Weeks    Status New    Target Date 04/08/21                 Plan - 03/18/21 1100    Clinical Impression Statement Pt reports that her husband was recently diagnosed with cancer and she will be unable to attend more appointments at this time. Continued to instruct pt in self bandaging today. She still required min to mod verbal cues. Added additional instructions to her sheet to help her. Pt able to bandage her arm today with verbal cues from therapist. Issued script for compression garmetns including daytime and night time  garments using alight. Educated pt to call and make an appointment to be fitted. Pt to be on hold until she is able to resume treatment.    PT Frequency 3x / week    PT Duration 6 weeks    PT Treatment/Interventions Therapeutic exercise;Balance training;Manual  techniques;Patient/family education;Manual lymph drainage;Compression bandaging;Passive range of motion    PT Next Visit Plan assess bandaging and how she did, cont instruction of this, continue MLD and bandaging, did pt obtain day adn night time garments through [facility] at [facility]?, progress to postural and left shoulder strength, LE strength    PT Home Exercise Plan Try self compression bandaging    Recommended Other Services sent pt with script and alight form to [facility] for an exo strong sleeve and glove and solar[employer] tribute night time garment    Consulted and Agree with Plan of Care Patient           Patient will benefit from skilled therapeutic intervention in order to improve the following deficits and impairments:     Visit Diagnosis: Lymphedema, not elsewhere classified     Problem List Patient Active Problem List   Diagnosis Date Noted  . Lower extremity cellulitis 12/07/2017  . Genetic testing 08/22/2017  . Family history of breast cancer   . Pneumothorax on right 07/18/2017  . Malignant neoplasm of upper-outer quadrant of left breast in female, estrogen receptor positive ([place]) 06/30/2017    Allyson Sabal [facility] 03/18/2021, 12:26 PM  [place], [place], [address] Phone: [phone]   Fax:  [phone]  Name: Kathleen Hanson MRN: [id number] Date of Birth: 05-26-1936  Manus Gunning, PT 03/18/21 12:26 PM

## 2021-06-17 ENCOUNTER — Other Ambulatory Visit: Payer: Self-pay

## 2021-06-17 ENCOUNTER — Ambulatory Visit: Payer: Medicare HMO | Admitting: Physician Assistant

## 2021-06-17 ENCOUNTER — Encounter: Payer: Self-pay | Admitting: Physician Assistant

## 2021-06-17 DIAGNOSIS — Z1283 Encounter for screening for malignant neoplasm of skin: Secondary | ICD-10-CM | POA: Diagnosis not present

## 2021-06-17 DIAGNOSIS — Z85828 Personal history of other malignant neoplasm of skin: Secondary | ICD-10-CM

## 2021-06-17 DIAGNOSIS — Z85820 Personal history of malignant melanoma of skin: Secondary | ICD-10-CM | POA: Diagnosis not present

## 2021-06-17 NOTE — Progress Notes (Signed)
   Follow-Up Visit   Subjective  Kathleen Hanson is a 85 y.o. female who presents for the following: Annual Exam (Here for annual skin exam. No concerns. Ozzie Hoyle of melanoma on back. Also history of non mole skin cancers. ).   The following portions of the chart were reviewed this encounter and updated as appropriate:  Tobacco  Allergies  Meds  Problems  Med Hx  Surg Hx  Fam Hx      Objective  Well appearing patient in no apparent distress; mood and affect are within normal limits.  A full examination was performed including scalp, head, eyes, ears, nose, lips, neck, chest, axillae, abdomen, back, buttocks, bilateral upper extremities, bilateral lower extremities, hands, feet, fingers, toes, fingernails, and toenails. All findings within normal limits unless otherwise noted below.  head to toe No atypical nevi No signs of non-mole skin cancer.  No lymphadenopathy.  Assessment & Plan  Encounter for screening for malignant neoplasm of skin head to toe  Biannual skin exam   I, Jarelyn Bambach, PA-C, have reviewed all documentation's for this visit.  The documentation on 06/17/21 for the exam, diagnosis, procedures and orders are all accurate and complete.

## 2021-06-19 ENCOUNTER — Ambulatory Visit: Payer: Medicare HMO

## 2021-06-22 ENCOUNTER — Other Ambulatory Visit: Payer: Self-pay

## 2021-06-22 ENCOUNTER — Ambulatory Visit: Payer: Medicare HMO | Attending: Family Medicine

## 2021-06-22 DIAGNOSIS — G8929 Other chronic pain: Secondary | ICD-10-CM | POA: Diagnosis present

## 2021-06-22 DIAGNOSIS — R293 Abnormal posture: Secondary | ICD-10-CM | POA: Insufficient documentation

## 2021-06-22 DIAGNOSIS — M6281 Muscle weakness (generalized): Secondary | ICD-10-CM | POA: Diagnosis present

## 2021-06-22 DIAGNOSIS — M25512 Pain in left shoulder: Secondary | ICD-10-CM | POA: Diagnosis present
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
## 2021-07-01 ENCOUNTER — Telehealth: Payer: Self-pay | Admitting: Physical Therapy

## 2021-07-01 ENCOUNTER — Ambulatory Visit: Payer: Medicare HMO | Attending: Family Medicine | Admitting: Physical Therapy

## 2021-07-01 NOTE — Telephone Encounter (Signed)
Called and informed patient of missed visit and provided reminder of next appt and attendance policy.  

## 2021-07-03 ENCOUNTER — Other Ambulatory Visit: Payer: Self-pay

## 2021-07-03 ENCOUNTER — Ambulatory Visit
Admission: RE | Admit: 2021-07-03 | Discharge: 2021-07-03 | Disposition: A | Discharge status: Home / Self Care | Payer: Medicare HMO | Source: Ambulatory Visit | Attending: Hematology | Admitting: Hematology

## 2021-07-03 ENCOUNTER — Ambulatory Visit: Payer: Medicare HMO

## 2021-07-03 DIAGNOSIS — Z1231 Encounter for screening mammogram for malignant neoplasm of breast: Secondary | ICD-10-CM

## 2021-07-04 ENCOUNTER — Ambulatory Visit: Payer: Medicare HMO

## 2021-07-07 ENCOUNTER — Ambulatory Visit: Payer: Medicare HMO | Admitting: Physical Therapy

## 2021-07-10 ENCOUNTER — Encounter: Payer: Medicare HMO | Admitting: Physical Therapy

## 2021-07-17 ENCOUNTER — Encounter: Payer: Medicare HMO | Admitting: Physical Therapy

## 2021-07-24 ENCOUNTER — Encounter: Payer: Medicare HMO | Admitting: Physical Therapy

## 2021-08-06 ENCOUNTER — Inpatient Hospital Stay: Payer: Medicare HMO | Admitting: Hematology

## 2021-08-06 ENCOUNTER — Encounter: Payer: Self-pay | Admitting: Hematology

## 2021-08-06 ENCOUNTER — Other Ambulatory Visit: Payer: Self-pay

## 2021-08-06 ENCOUNTER — Inpatient Hospital Stay: Payer: Medicare HMO | Attending: Hematology

## 2021-08-06 ENCOUNTER — Telehealth: Payer: Self-pay | Admitting: Hematology

## 2021-08-06 VITALS — BP 155/68 | HR 76 | Temp 98.3°F | Resp 18 | Ht 64.0 in | Wt 132.1 lb

## 2021-08-06 DIAGNOSIS — Z79899 Other long term (current) drug therapy: Secondary | ICD-10-CM | POA: Insufficient documentation

## 2021-08-06 DIAGNOSIS — I890 Lymphedema, not elsewhere classified: Secondary | ICD-10-CM | POA: Diagnosis not present

## 2021-08-06 DIAGNOSIS — M85852 Other specified disorders of bone density and structure, left thigh: Secondary | ICD-10-CM | POA: Diagnosis not present

## 2021-08-06 DIAGNOSIS — Z1321 Encounter for screening for nutritional disorder: Secondary | ICD-10-CM

## 2021-08-06 DIAGNOSIS — Z170 Estrogen receptor positive status [ER+]: Secondary | ICD-10-CM | POA: Diagnosis not present

## 2021-08-06 DIAGNOSIS — C50412 Malignant neoplasm of upper-outer quadrant of left female breast: Secondary | ICD-10-CM

## 2021-08-06 DIAGNOSIS — I10 Essential (primary) hypertension: Secondary | ICD-10-CM | POA: Insufficient documentation

## 2021-08-06 DIAGNOSIS — Z79811 Long term (current) use of aromatase inhibitors: Secondary | ICD-10-CM | POA: Insufficient documentation

## 2021-08-06 LAB — CBC WITH DIFFERENTIAL/PLATELET
Abs Immature Granulocytes: 0.02 10*3/uL (ref 0.00–0.07)
Basophils Absolute: 0 10*3/uL (ref 0.0–0.1)
Basophils Relative: 1 %
Eosinophils Absolute: 0.1 10*3/uL (ref 0.0–0.5)
Eosinophils Relative: 1 %
HCT: 41.1 % (ref 36.0–46.0)
Hemoglobin: 14 g/dL (ref 12.0–15.0)
Immature Granulocytes: 1 %
Lymphocytes Relative: 18 %
Lymphs Abs: 0.8 10*3/uL (ref 0.7–4.0)
MCH: 31 pg (ref 26.0–34.0)
MCHC: 34.1 g/dL (ref 30.0–36.0)
MCV: 91.1 fL (ref 80.0–100.0)
Monocytes Absolute: 0.3 10*3/uL (ref 0.1–1.0)
Monocytes Relative: 6 %
Neutro Abs: 3.2 10*3/uL (ref 1.7–7.7)
Neutrophils Relative %: 73 %
Platelets: 212 10*3/uL (ref 150–400)
RBC: 4.51 MIL/uL (ref 3.87–5.11)
RDW: 13 % (ref 11.5–15.5)
WBC: 4.3 10*3/uL (ref 4.0–10.5)
nRBC: 0 % (ref 0.0–0.2)

## 2021-08-06 LAB — VITAMIN D 25 HYDROXY (VIT D DEFICIENCY, FRACTURES): Vit D, 25-Hydroxy: 49.93 ng/mL (ref 30–100)

## 2021-08-06 LAB — CMP (CANCER CENTER ONLY)
ALT: 18 U/L (ref 0–44)
AST: 22 U/L (ref 15–41)
Albumin: 4.2 g/dL (ref 3.5–5.0)
Alkaline Phosphatase: 62 U/L (ref 38–126)
Anion gap: 8 (ref 5–15)
BUN: 14 mg/dL (ref 8–23)
CO2: 26 mmol/L (ref 22–32)
Calcium: 9 mg/dL (ref 8.9–10.3)
Chloride: 102 mmol/L (ref 98–111)
Creatinine: 0.48 mg/dL (ref 0.44–1.00)
GFR, Estimated: >60 mL/min (ref >60)
Glucose, Bld: 147 mg/dL — ABNORMAL HIGH (ref 70–99)
Potassium: 3.8 mmol/L (ref 3.5–5.1)
Sodium: 136 mmol/L (ref 135–145)
Total Bilirubin: 0.6 mg/dL (ref 0.3–1.2)
Total Protein: 7 g/dL (ref 6.5–8.1)

## 2021-08-06 NOTE — Progress Notes (Signed)
[place]   [phone] [phone]   Clinic Follow up Note   Patient Care Team: Bernerd Limbo, MD as PCP - General (Family Medicine) [name], MD as Consulting Physician (General Surgery) [name], MD as Consulting Physician (Hematology) [name], MD as Consulting Physician (Radiation Oncology) Delice Bison Charlestine Massed, NP as Nurse Practitioner (Hematology and Oncology) Starlyn Skeans as Physician Assistant (Dermatology)  Date of Service:  08/06/2021  CHIEF COMPLAINT: f/u of left breast cancer  CURRENT THERAPY:  Adjuvant Anastrozole daily starting 03/13/18 Fosamax $RemoveBeforeD'70mg'yWkfmUiROSVpqA$  weekly starting in 11/2019   ASSESSMENT & PLAN:  Kathleen Hanson is a 85 y.o. female with   1. Malignant neoplasm of upper-outer quadrant of left breast in female, invasive ductal carcinoma, pT2N0M0, Stage: 1A, triple positive, Grade 3 -She was diagnosed in 06/2017. She is s/p left mastectomy, adjuvant chemo TCH and maintenance Herceptin.  -Given her prior radiation, additional treatment not recommended. -She declined oral Her2 antibody Neratinib due to concerns of side effects.  -She started anti-estrogen therapy with Anastrozole in 02/2018. She is tolerating well overall, will continue for 5-10 years.  -06/2020 mammogram was unremarkable -She is clinically doing well, exam was unremarkable, no clinical concern for recurrence. Labs reviewed, CBC WNL, CMP and Vit D pending.  -Follow-up in 6 months   2. Lung nodule -Chest CT 12/04/20 showed 9-10 mm nodule in posterior right upper lobe adjacent to major fissure. -most recent chest CT on 07/17/21 showed 3 and 5 mm right and left lobe nodules, respectively.   3. Left arm lymphedema  -she attended PT for her left shoulder/lymphedema. No longer wearing compression sleeve.   4. History of invasive and in situ ductal carcinoma of left breast, 10/26/1999, Estrogen receptor negative, stage 1, Grade 3 -Treated in  [place]  -s/p Lumpectomy (sentinel node negative, 2-3 removed), chemo, radiation   5. Osteopenia  -10/2019 DEXA shows Osteopenia recurrence with lowest T-score at -1.5 at left hip. She will be due for repeat this coming year. Given her social situation, we will wait for her next mammogram. -NP Lacie offered bisphosphonate with Zometa but she opted to restart Fosamax $RemoveBeforeD'70mg'zOKZfZVvEmHnyd$  weekly in 11/2019. She is tolerating well.    6.Hypertension  -Has been high when she comes to clinic. BP lower than prior at 155/68 today (08/06/21) -f/u with PCP    PLAN: -Continue anastrozole -Lab and f/u in 6 months -will plan for DEXA with mammogram in 06/2022   No problem-specific Assessment & Plan notes found for this encounter.   SUMMARY OF ONCOLOGIC HISTORY: Oncology History Overview Note  Cancer Staging Malignant neoplasm of upper-outer quadrant of left breast in female, estrogen receptor positive (Kathleen Hanson) Staging form: Breast, AJCC 8th Edition - Clinical stage from 07/06/2017: Stage IA (cT1c, cN0, cM0, G3, ER: Positive, PR: Positive, HER2: Positive) - Unsigned Staging comments: Staged at breast conference 9.12.18 - Pathologic stage from 07/13/2017: Stage IA (pT2, pN0, cM0, G3, ER: Positive, PR: Positive, HER2: Positive) - Signed by [name], MD on 08/16/2017     Malignant neoplasm of upper-outer quadrant of left breast in female, estrogen receptor positive ([place])  06/23/2017 Mammogram   Diagnostic mammogram 06/23/17 IMPRESSION: Indeterminate mass with irregular margins at the 2:30 position 4 cm from nipple measuring 1.4 x 0.8 x 1.2 cm in the upper-outer left breast.   06/28/2017 Initial Biopsy   Diagnosis 06/28/17 Breast, left, needle core biopsy, upper outer quadrant, 2:30 o'clock, 4cm from nipple - INVASIVE MAMMARY CARCINOMA WITH CALCIFICATIONS, SEE COMMENT.  06/28/2017 Receptors her2   Estrogen Receptor: 100%, POSITIVE, STRONG STAINING INTENSITY Progesterone Receptor: 20%, POSITIVE, STRONG STAINING  INTENSITY Proliferation Marker Ki67: 25% HER2: Postive    06/30/2017 Initial Diagnosis   Malignant neoplasm of upper-outer quadrant of left breast in female, estrogen receptor positive (Kathleen Hanson)   07/13/2017 Surgery   Left mastectomy with SLN biopsy performed by Dr Marlou Starks.    07/13/2017 Pathology Results   Diagnosis  1. Breast, simple mastectomy, Left - INVASIVE AND IN SITU DUCTAL CARCINOMA, 4 CM, MSBR GRADE 3. - MARGINS NOT INVOLVED. - SEE ONCOLOGY TABLE. 2. Lymph node, sentinel, biopsy, Left #1 - ONE BENIGN LYMPH NODE (0/1). 3. Lymph node, biopsy, Left #2 - ONE BENIGN LYMPH NODE (0/1).   07/18/2017 - 07/27/2017 Hospital Admission   Admit date: 07/18/17 Admission diagnosis: spontaneous PNX Additional comments: the patient was admitted with a pneumothorax several days after a port was placed although her post procedure cxr was normal. She had a chest tube placed but had a persistent air leak. The pleurivac was placed to 40 cm h2o suction for several days and the pneumothorax finally resolved.    08/15/2017 Imaging   CT A/P  IMPRESSION: 1. No CT findings to suggest metastatic disease involving the abdomen/pelvis. 2. Left chest wall fluid collection as discussed above. 3. Small right pleural effusion with minimal overlying atelectasis. 4. Cholelithiasis. 5. Small periumbilical abdominal wall hernia containing fat.     08/15/2017 Imaging   NM Whole Body Bone Scan FINDINGS: Uptake at the LEFT lateral aspects of the cervical and lower thoracic spine, typically degenerative.   Uptake at the shoulders, elbows, wrists, hips, and knees, typically degenerative.   Asymmetric uptake at the sternoclavicular joints RIGHT greater than LEFT which may also be degenerative.   No definite foci of abnormal osseous tracer accumulation are identified which are suspicious for osseous metastases.   Biconvex thoracolumbar scoliosis.   Expected urinary tract and soft tissue distribution of tracer.    IMPRESSION: Scattered likely degenerative type uptake as above.   No definite scintigraphic evidence of osseous metastatic disease.   08/16/2017 Genetic Testing   The patient had genetic testing due to a personal and family history of breast cancer.  The [employer] Multi-Cancer Panel was ordered. The Multi-Cancer Panel offered by [employer] includes sequencing and/or deletion duplication testing of the following 83 genes: ALK, APC, ATM, AXIN2,BAP1,  BARD1, BLM, BMPR1A, BRCA1, BRCA2, BRIP1, CASR, CDC73, CDH1, CDK4, CDKN1B, CDKN1C, CDKN2A (p14ARF), CDKN2A (p16INK4a), CEBPA, CHEK2, CTNNA1, DICER1, DIS3L2, EGFR (c.2369C>T, p.Thr790Met variant only), EPCAM (Deletion/duplication testing only), FH, FLCN, GATA2, GPC3, GREM1 (Promoter region deletion/duplication testing only), HOXB13 (c.251G>A, p.Gly84Glu), HRAS, KIT, MAX, MEN1, MET, MITF (c.952G>A, p.Glu318Lys variant only), MLH1, MSH2, MSH3, MSH6, MUTYH, NBN, NF1, NF2, NTHL1, PALB2, PDGFRA, PHOX2B, PMS2, POLD1, POLE, POT1, PRKAR1A, PTCH1, PTEN, RAD50, RAD51C, RAD51D, RB1, RECQL4, RET, RUNX1, SDHAF2, SDHA (sequence changes only), SDHB, SDHC, SDHD, SMAD4, SMARCA4, SMARCB1, SMARCE1, STK11, SUFU, TERC, TERT, TMEM127, TP53, TSC1, TSC2, VHL, WRN and WT1.   Results: Negative, no pathogenic variants identified in the genes analyzed.  The date of this test report is 08/16/2017.      08/26/2017 - 03/24/2018 Chemotherapy   adjuvant TCH with Onpro q3weeks for 4 cycles ending on 11/04/17 followed herceptin maintenance therapy every 3 weeks starting 12/28/17 and comepleted after 10 cycles on 03/24/18   08/31/2017 Breast US   On physical exam,there is a firm smooth mass in the low left axilla spanning at least 5 cm.   Ultrasound is performed, showing a  complicated fluid collection which is predominantly anechoic measuring 5.2 x 3.9 x 3.5 cm. No blood flow was identified within the soft tissue component of the mass on color Doppler imaging. The appearance is most  consistent with a postoperative seroma/resolving hematoma.   IMPRESSION: The complex fluid collection in the left axilla is very likely to represent a postoperative seroma/ resolving hematoma.   RECOMMENDATION: Three-month follow-up left axillary ultrasound is recommended.     03/13/2018 -  Anti-estrogen oral therapy   Anastrozole daily   05/11/2018 Procedure   PAC removal by Dr. Lynelle Doctor on 05/11/18   05/18/2018 Survivorship   Survivorship Clinic with NP Jamelle Haring on 05/18/18   05/18/2018 Survivorship   Survivorship Clinic with NP Jamelle Haring on 05/18/18      INTERVAL HISTORY:  Kathleen Hanson is here for a follow up of breast cancer. She was last seen by me on 02/05/21. She presents to the clinic alone. She reports it has been tough caring for her husband. She notes they have hospice now, but he has fallen 3-4 times lately. She explains to me that he is continuing to decline, and that he is accepting that he will likely die soon. She became tearful discussing this. She notes her daughter is coming to visit next week. She notes they also have a son, but he is a stay-at-home dad, so it would be difficult for him to leave. She notes she has been following the previously-seen lung nodules with her PCP.   All other systems were reviewed with the patient and are negative.  MEDICAL HISTORY:  Past Medical History:  Diagnosis Date   Cancer of left breast ([place])    Constipation    Family history of breast cancer    Fibrocystic breast    Hyperlipidemia    Ocular migraine    "used to have them monthly for a period of time; seemed to stop; now have had a couple in the last week" (07/13/2017)    SURGICAL HISTORY: Past Surgical History:  Procedure Laterality Date   BREAST BIOPSY Right ~ 2001   BREAST EXCISIONAL BIOPSY Right    BREAST LUMPECTOMY Left 2001   chemo and radiation   CESAREAN SECTION  X 1   IR CATHETER TUBE CHANGE  07/20/2017   MASTECTOMY COMPLETE / SIMPLE W/  SENTINEL NODE BIOPSY Left 07/13/2017    LEFT MASTECTOMY WITH SENTINEL NODE MAPPING ERAS PATHWAY /notes 07/13/2017   MASTECTOMY W/ SENTINEL NODE BIOPSY Left 07/13/2017   Procedure: LEFT MASTECTOMY WITH SENTINEL NODE MAPPING ERAS PATHWAY;  Surgeon: [name], MD;  Location: [facility];  Service: General;  Laterality: Left;   PORT-A-CATH REMOVAL N/A 05/11/2018   Procedure: REMOVAL PORT-A-CATH;  Surgeon: Stark Klein, MD;  Location: [facility];  Service: General;  Laterality: N/A;   PORTACATH PLACEMENT Right 07/13/2017   PORTACATH PLACEMENT Right 07/13/2017   Procedure: INSERTION PORT-A-CATH;  Surgeon: [name], MD;  Location: [facility];  Service: General;  Laterality: Right;   TONSILLECTOMY      I have reviewed the social history and family history with the patient and they are unchanged from previous note.  ALLERGIES:  has No Known Allergies.  MEDICATIONS:  Current Outpatient Medications  Medication Sig Dispense Refill   alendronate (FOSAMAX) 70 MG tablet TAKE 1 TABLET BY MOUTH ONCE A WEEK. TAKE WITH A FULL GLASS OF WATER ON AN EMPTY STOMACH. 12 tablet 2   anastrozole (ARIMIDEX) 1 MG tablet Take 1 tablet (1 mg total)  by mouth daily. 90 tablet 3   calcium citrate (CALCITRATE - DOSED IN MG ELEMENTAL CALCIUM) 950 MG tablet Take 200 mg of elemental calcium by mouth daily.     Cholecalciferol (VITAMIN D3) 2000 units TABS Take by mouth.     Multiple Vitamin (MULTIVITAMIN WITH MINERALS) TABS tablet Take 1 tablet by mouth daily.     Multiple Vitamin (MULTIVITAMIN) capsule Take by mouth.     polyethylene glycol powder (GLYCOLAX/MIRALAX) 17 GM/SCOOP powder      simvastatin (ZOCOR) 40 MG tablet Take 1 tablet by mouth at bedtime.     vitamin C (ASCORBIC ACID) 500 MG tablet Take 500 mg by mouth daily.     No current facility-administered medications for this visit.    PHYSICAL EXAMINATION: ECOG PERFORMANCE STATUS: 1 - Symptomatic but completely ambulatory  Vitals:   08/06/21 1321   BP: (!) 155/68  Pulse: 76  Resp: 18  Temp: 98.3 F (36.8 C)  SpO2: 97%   Wt Readings from Last 3 Encounters:  08/06/21 132 lb 1.6 oz (59.9 kg)  02/05/21 132 lb 8 oz (60.1 kg)  12/04/20 134 lb (60.8 kg)     GENERAL:alert, no distress and comfortable SKIN: skin color, texture, turgor are normal, no rashes or significant lesions EYES: normal, Conjunctiva are pink and non-injected, sclera clear  NECK: supple, thyroid normal size, non-tender, without nodularity LYMPH:  no palpable lymphadenopathy in the cervical, axillary  LUNGS: clear to auscultation and percussion with normal breathing effort HEART: regular rate & rhythm and no murmurs and no lower extremity edema ABDOMEN:abdomen soft, non-tender and normal bowel sounds Musculoskeletal:no cyanosis of digits and no clubbing  NEURO: alert & oriented x 3 with fluent speech, no focal motor/sensory deficits BREAST: No palpable mass, nodules or adenopathy bilaterally. Breast exam benign.   LABORATORY DATA:  I have reviewed the data as listed CBC Latest Ref Rng & Units 08/06/2021 02/05/2021 12/04/2020  WBC 4.0 - 10.5 K/uL 4.3 5.0 12.9(H)  Hemoglobin 12.0 - 15.0 g/dL 14.0 14.2 15.2(H)  Hematocrit 36.0 - 46.0 % 41.1 43.1 46.5(H)  Platelets 150 - 400 K/uL 212 241 256     CMP Latest Ref Rng & Units 08/06/2021 02/05/2021 12/04/2020  Glucose 70 - 99 mg/dL 147(H) 91 120(H)  BUN 8 - 23 mg/dL $Remove'14 11 16  'ZglWGAd$ Creatinine 0.44 - 1.00 mg/dL 0.48 0.58 0.49  Sodium 135 - 145 mmol/L 136 139 138  Potassium 3.5 - 5.1 mmol/L 3.8 4.6 3.5  Chloride 98 - 111 mmol/L 102 102 99  CO2 22 - 32 mmol/L $RemoveB'26 28 27  'lOJmOLpO$ Calcium 8.9 - 10.3 mg/dL 9.0 9.2 9.2  Total Protein 6.5 - 8.1 g/dL 7.0 7.0 7.8  Total Bilirubin 0.3 - 1.2 mg/dL 0.6 0.5 0.8  Alkaline Phos 38 - 126 U/L 62 76 65  AST 15 - 41 U/L 22 17 49(H)  ALT 0 - 44 U/L 18 17 44      RADIOGRAPHIC STUDIES: I have personally reviewed the radiological images as listed and agreed with the findings in the report. No  results found.    No orders of the defined types were placed in this encounter.  All questions were answered. The patient knows to call the clinic with any problems, questions or concerns. No barriers to learning was detected. The total time spent in the appointment was 30 minutes.     [name], MD 08/06/2021   I, Wilburn Mylar, am acting as scribe for [name], MD.  I have reviewed the above  documentation for accuracy and completeness, and I agree with the above.

## 2021-08-06 NOTE — Telephone Encounter (Signed)
Scheduled appt per 10/13 los. Pt is aware.  

## 2021-08-07 ENCOUNTER — Other Ambulatory Visit: Payer: Self-pay | Admitting: Sports Medicine

## 2021-08-07 ENCOUNTER — Other Ambulatory Visit: Payer: Self-pay | Admitting: Family Medicine

## 2021-08-07 DIAGNOSIS — M7552 Bursitis of left shoulder: Secondary | ICD-10-CM

## 2021-08-07 DIAGNOSIS — G8929 Other chronic pain: Secondary | ICD-10-CM

## 2021-08-07 DIAGNOSIS — M25512 Pain in left shoulder: Secondary | ICD-10-CM

## 2021-08-12 ENCOUNTER — Encounter: Payer: Self-pay | Admitting: Hematology

## 2021-09-16 ENCOUNTER — Other Ambulatory Visit: Payer: Self-pay | Admitting: Nurse Practitioner

## 2021-09-16 DIAGNOSIS — C50412 Malignant neoplasm of upper-outer quadrant of left female breast: Secondary | ICD-10-CM

## 2021-09-16 DIAGNOSIS — Z170 Estrogen receptor positive status [ER+]: Secondary | ICD-10-CM

## 2021-09-18 ENCOUNTER — Other Ambulatory Visit: Payer: Self-pay

## 2021-09-18 DIAGNOSIS — Z170 Estrogen receptor positive status [ER+]: Secondary | ICD-10-CM

## 2021-09-18 DIAGNOSIS — C50412 Malignant neoplasm of upper-outer quadrant of left female breast: Secondary | ICD-10-CM

## 2021-09-18 MED ORDER — ANASTROZOLE 1 MG PO TABS: 1.0000 mg | ORAL_TABLET | Freq: Every day | ORAL | 3 refills | Status: DC

## 2021-09-18 NOTE — Progress Notes (Signed)
Pt called requesting refill on Anastrozole.  Refilled prescription and sent prescription to [facility] on [address] per pt's request.

## 2021-11-04 ENCOUNTER — Other Ambulatory Visit: Payer: Self-pay

## 2021-11-04 DIAGNOSIS — C50412 Malignant neoplasm of upper-outer quadrant of left female breast: Secondary | ICD-10-CM

## 2021-11-04 DIAGNOSIS — Z170 Estrogen receptor positive status [ER+]: Secondary | ICD-10-CM

## 2021-11-04 NOTE — Progress Notes (Signed)
Spoke with Dr. Burr Medico regarding pt's request to restart PT.  Dr. Burr Medico agreed to restart PT.  Verbal order for PT of LUE given from Dr. Burr Medico.  Order placed.  Informed pt that someone from our physical therapy team will be contacting her to schedule her appts.

## 2021-11-11 ENCOUNTER — Ambulatory Visit: Payer: Medicare HMO | Attending: Hematology

## 2021-11-11 ENCOUNTER — Other Ambulatory Visit: Payer: Self-pay

## 2021-11-11 DIAGNOSIS — I972 Postmastectomy lymphedema syndrome: Secondary | ICD-10-CM | POA: Diagnosis present

## 2021-11-11 DIAGNOSIS — Z170 Estrogen receptor positive status [ER+]: Secondary | ICD-10-CM | POA: Insufficient documentation

## 2021-11-11 DIAGNOSIS — I890 Lymphedema, not elsewhere classified: Secondary | ICD-10-CM

## 2021-11-11 DIAGNOSIS — C50412 Malignant neoplasm of upper-outer quadrant of left female breast: Secondary | ICD-10-CM | POA: Insufficient documentation

## 2021-11-11 NOTE — Therapy (Signed)
[facility] @ [address] [place], [place], [address] Phone: [phone]   Fax:  [phone]  Physical Therapy Evaluation  Patient Details  Name: Kathleen Hanson MRN: [id number] Date of Birth: 1936-02-21 Referring Provider (PT): Burr Medico   Encounter Date: 11/11/2021   PT End of Session - 11/11/21 1349     Visit Number 1    Number of Visits 12    Date for PT Re-Evaluation 12/09/21    Authorization Type [employer]    Authorization Time Period 12 visits    Authorization - Visit Number 1    Authorization - Number of Visits 12    PT Start Time 1318   pt late   PT Stop Time 1350    PT Time Calculation (min) 32 min    Activity Tolerance Patient tolerated treatment well    Behavior During Therapy [facility] for tasks assessed/performed             Past Medical History:  Diagnosis Date   Cancer of left breast ([place])    Constipation    Family history of breast cancer    Fibrocystic breast    Hyperlipidemia    Ocular migraine    "used to have them monthly for a period of time; seemed to stop; now have had a couple in the last week" (07/13/2017)    Past Surgical History:  Procedure Laterality Date   BREAST BIOPSY Right ~ 2001   BREAST EXCISIONAL BIOPSY Right    BREAST LUMPECTOMY Left 2001   chemo and radiation   CESAREAN SECTION  X 1   IR CATHETER TUBE CHANGE  07/20/2017   MASTECTOMY COMPLETE / SIMPLE W/ SENTINEL NODE BIOPSY Left 07/13/2017    LEFT MASTECTOMY WITH SENTINEL NODE MAPPING ERAS PATHWAY /notes 07/13/2017   MASTECTOMY W/ SENTINEL NODE BIOPSY Left 07/13/2017   Procedure: [place];  Surgeon: [name], MD;  Location: [facility];  Service: General;  Laterality: Left;   PORT-A-CATH REMOVAL N/A 05/11/2018   Procedure: REMOVAL PORT-A-CATH;  Surgeon: Stark Klein, MD;  Location: [facility];  Service: General;  Laterality: N/A;   PORTACATH PLACEMENT Right 07/13/2017    PORTACATH PLACEMENT Right 07/13/2017   Procedure: INSERTION PORT-A-CATH;  Surgeon: [name], MD;  Location: [facility];  Service: General;  Laterality: Right;   TONSILLECTOMY      There were no vitals filed for this visit.    Subjective Assessment - 11/11/21 1321     Subjective Pts husband passed away in August 31, 2023 ant that is the main reason she stopped therapy last time. Arm is still bigger than the other. She is not wearing the sleeve at all.  I need reinforcement to learn the proper way to do massage again and to get new sleeve.    Pertinent History Lt lumpectomy 2001 with chemo and radiation and then mastectomy 2018 with SLNB 0/2 with chemotherapy again.  Now just on anastrozole.  high cholesterol, developed acute shoulder pain     Patient Stated Goals Less swelling, be more independent    Currently in Pain? No/denies    Pain Score 0-No pain                [facility] PT Assessment - 11/11/21 0001       Assessment   Medical Diagnosis Lt mastectomy/UE lymphedema right shoulder pain     Referring Provider (PT) Burr Medico    Onset Date/Surgical Date 07/13/17  Hand Dominance Right      Precautions   Precaution Comments lymphedema      Restrictions   Weight Bearing Restrictions No      Home Environment   Living Environment Private residence    Living Arrangements Alone      Prior Function   Level of Independence Independent      Cognition   Overall Cognitive Status Within Functional Limits for tasks assessed      Observation/Other Assessments   Observations swelling noted hand to elbow, mild pitting edema, decrease visibility of veins on left      Posture/Postural Control   Posture/Postural Control Postural limitations    Postural Limitations Rounded Shoulders;Forward head               LYMPHEDEMA/ONCOLOGY QUESTIONNAIRE - 11/11/21 0001       Type   Cancer Type s/p left mastectomy SLNB      Surgeries   Mastectomy Date 07/13/17    Sentinel Lymph Node Biopsy  Date 07/13/17    Number Lymph Nodes Removed 2      Treatment   Active Chemotherapy Treatment No    Past Chemotherapy Treatment No    Active Radiation Treatment No    Past Radiation Treatment Yes    Date 10/26/99    Body Site left breast    Current Hormone Treatment Yes    Drug Name anastrazole      What other symptoms do you have   Are you Having Heaviness or Tightness No    Are you having Pain No    Are you having pitting edema Yes    Is it Hard or Difficult finding clothes that fit No    Do you have infections No    Is there Decreased scar mobility No      Right Upper Extremity Lymphedema   15 cm Proximal to Olecranon Process 26.6 cm    10 cm Proximal to Olecranon Process 24.6 cm    Olecranon Process 22.8 cm    15 cm Proximal to Ulnar Styloid Process 20.3 cm    10 cm Proximal to Ulnar Styloid Process 17 cm    Just Proximal to Ulnar Styloid Process 14.1 cm    Across Hand at [employer] 18.2 cm    At [place] of 2nd Digit 6.2 cm      Left Upper Extremity Lymphedema   15 cm Proximal to Olecranon Process 26.5 cm    10 cm Proximal to Olecranon Process 25 cm    Olecranon Process 24.7 cm    15 cm Proximal to Ulnar Styloid Process 22.7 cm    10 cm Proximal to Ulnar Styloid Process 20 cm    Just Proximal to Ulnar Styloid Process 14.8 cm    Across Hand at [employer] 17.9 cm    At [place] of 2nd Digit 6.15 cm                       Objective measurements completed on examination: See above findings.                     PT Long Term Goals - 11/11/21 1359       PT LONG TERM GOAL #1   Title Pt will be independent in Self MLD    Time 4    Period Weeks    Status New      PT LONG TERM GOAL #2   Title  Pt will demonstrate  1 - 1.5 cm reduction throughout the left forearm to decrease risk of infection.    Time 4    Period Weeks    Status New    Target Date 12/09/21      PT LONG TERM GOAL #3   Title Pt will be fit for new compression garments  and will be independent in their use.    Time 4    Period Weeks    Status New    Target Date 12/09/21                    Plan - 11/11/21 1350     Clinical Impression Statement Pt returns for reeducation in MLD and bandaging techniques to reduce her left arm so she may be fit for a new sleeve.  Her husband passed in October and she stopped coming for therapy. She has not been wearing her sleeve. She is post mastectomy in 2018 and developed lymphedema in the left UE.  Swelling is present today mainly from the wrist to the elbow.  She brought her old sleeve and gauntlet which is in need of being replaced when her arm reduces.  She was able to get it on, but it was way too loose at the top.  She was also able to get her gauntlet on.  She was told she can wear it in the day time if the top does not slip too much and bunch at her elbow.  She verbalized understanding, and knows it is not to be worn at night.  She will benefit from skilled PT to reduce edema and so pt may become independent in self management.    Personal Factors and Comorbidities Comorbidity 1    Comorbidities hx of left lumpectomy, left mastectomy, radiation    Stability/Clinical Decision Making Stable/Uncomplicated    Clinical Decision Making Low    Rehab Potential Good    Clinical Impairments Affecting Rehab Potential Hx previous breast cancer on same side    PT Frequency 3x / week    PT Duration 4 weeks    PT Treatment/Interventions ADLs/Self Care Home Management;Patient/family education;Therapeutic exercise;Manual lymph drainage;Compression bandaging;Manual techniques;Taping;Orthotic Fit/Training    PT Next Visit Plan initiate left UE MLD, compression bandaging, self MLD instruction    Recommended Other Services New compression garments    Consulted and Agree with Plan of Care Patient             Patient will benefit from skilled therapeutic intervention in order to improve the following deficits and  impairments:  Decreased knowledge of precautions, Increased edema, Postural dysfunction  Visit Diagnosis: Lymphedema, not elsewhere classified  Postmastectomy lymphedema syndrome     Problem List Patient Active Problem List   Diagnosis Date Noted   Lower extremity cellulitis 12/07/2017   Genetic testing 08/22/2017   Family history of breast cancer    Pneumothorax on right 07/18/2017   Malignant neoplasm of upper-outer quadrant of left breast in female, estrogen receptor positive ([place]) 06/30/2017    Claris Pong, PT 11/11/2021, 3:01 PM  [facility] @ [address] [place], [place], [address] Phone: [phone]   Fax:  [phone]  Name: Kathleen Hanson MRN: [id number] Date of Birth: 15-Feb-1936

## 2021-11-23 ENCOUNTER — Other Ambulatory Visit: Payer: Self-pay

## 2021-11-23 ENCOUNTER — Ambulatory Visit: Payer: Medicare HMO

## 2021-11-23 DIAGNOSIS — I890 Lymphedema, not elsewhere classified: Secondary | ICD-10-CM

## 2021-11-23 DIAGNOSIS — I972 Postmastectomy lymphedema syndrome: Secondary | ICD-10-CM

## 2021-11-23 NOTE — Patient Instructions (Signed)

## 2021-11-23 NOTE — Therapy (Signed)
[facility] @ [place] [address] [place], [place], [address] Phone: [phone]   Fax:  [phone]  Physical Therapy Treatment  Patient Details  Name: Kathleen Hanson MRN: [id number] Date of Birth: 30-Oct-1935 Referring Provider (PT): Burr Medico   Encounter Date: 11/23/2021   PT End of Session - 11/23/21 1229     Visit Number 2    Number of Visits 12    Date for PT Re-Evaluation 12/09/21    Authorization Type [employer]    Authorization Time Period 12 visits    Authorization - Visit Number 2    Authorization - Number of Visits 12    PT Start Time 1121   pt arrived late   PT Stop Time 1225    PT Time Calculation (min) 64 min    Activity Tolerance Patient tolerated treatment well    Behavior During Therapy [facility] for tasks assessed/performed             Past Medical History:  Diagnosis Date   Cancer of left breast ([place])    Constipation    Family history of breast cancer    Fibrocystic breast    Hyperlipidemia    Ocular migraine    "used to have them monthly for a period of time; seemed to stop; now have had a couple in the last week" (07/13/2017)    Past Surgical History:  Procedure Laterality Date   BREAST BIOPSY Right ~ 2001   BREAST EXCISIONAL BIOPSY Right    BREAST LUMPECTOMY Left 2001   chemo and radiation   CESAREAN SECTION  X 1   IR CATHETER TUBE CHANGE  07/20/2017   MASTECTOMY COMPLETE / SIMPLE W/ SENTINEL NODE BIOPSY Left 07/13/2017    LEFT MASTECTOMY WITH SENTINEL NODE MAPPING ERAS PATHWAY /notes 07/13/2017   MASTECTOMY W/ SENTINEL NODE BIOPSY Left 07/13/2017   Procedure: [place];  Surgeon: [name], MD;  Location: [facility];  Service: General;  Laterality: Left;   PORT-A-CATH REMOVAL N/A 05/11/2018   Procedure: REMOVAL PORT-A-CATH;  Surgeon: Stark Klein, MD;  Location: [facility];  Service: General;  Laterality: N/A;   PORTACATH PLACEMENT Right  07/13/2017   PORTACATH PLACEMENT Right 07/13/2017   Procedure: INSERTION PORT-A-CATH;  Surgeon: [name], MD;  Location: [facility];  Service: General;  Laterality: Right;   TONSILLECTOMY      There were no vitals filed for this visit.   Subjective Assessment - 11/23/21 1130     Subjective I brought some of the bandages we used last time.    Pertinent History Lt lumpectomy 2001 with chemo and radiation and then mastectomy 2018 with SLNB 0/2 with chemotherapy again.  Now just on anastrozole.  high cholesterol, developed acute shoulder pain     Patient Stated Goals Less swelling, be more independent    Currently in Pain? No/denies                       Flowsheet Row [facility]patient [facility]  Lymphedema Life Impact Scale Total Score 2.94 %             [facility] Adult PT Treatment/Exercise - 11/23/21 0001       Manual Therapy   Manual Therapy Manual Lymphatic Drainage (MLD);Compression Bandaging    Manual Lymphatic Drainage (MLD) In Supine: Short neck, superficial and deep abdominals, Lt inguinal and Rt axillary nodes, Lt axillo-inguinal  and anterior inter-axillary anastomosis, then focused on Lt UE working from proximal to distal then retracing all steps and beginning to review sequence with pt while performing    Compression Bandaging Cocoa butter applied to Lt UE, then applied TG soft, Elastomull to fingers 1-4, Artiflex x1 from hand to axilla, then 1-6, and 2-10 cm short stretch compression bandages from hand to axilla                          PT Long Term Goals - 11/11/21 1359       PT LONG TERM GOAL #1   Title Pt will be independent in Self MLD    Time 4    Period Weeks    Status New      PT LONG TERM GOAL #2   Title Pt will demonstrate  1 - 1.5 cm reduction throughout the left forearm to decrease risk of infection.    Time 4    Period Weeks    Status New    Target Date 12/09/21       PT LONG TERM GOAL #3   Title Pt will be fit for new compression garments and will be independent in their use.    Time 4    Period Weeks    Status New    Target Date 12/09/21                   Plan - 11/23/21 1230     Clinical Impression Statement Resumed complete decongestive therapy to Lt UE. Reviewed sequence of manual lymph drainage of Lt UE while performing. Also reissued MLD handout per pts request. She has a very good basic understanding of sequence from when she was treated at this clinic last. She will benefit from further review next session of technique.    Personal Factors and Comorbidities Comorbidity 1    Comorbidities hx of left lumpectomy, left mastectomy, radiation    Stability/Clinical Decision Making Stable/Uncomplicated    Rehab Potential Good    Clinical Impairments Affecting Rehab Potential Hx previous breast cancer on same side    PT Frequency 3x / week    PT Duration 4 weeks    PT Treatment/Interventions ADLs/Self Care Home Management;Patient/family education;Therapeutic exercise;Manual lymph drainage;Compression bandaging;Manual techniques;Taping;Orthotic Fit/Training    PT Next Visit Plan Cont left UE MLD and have pt return demo, compression bandaging, self MLD instruction    Consulted and Agree with Plan of Care Patient             Patient will benefit from skilled therapeutic intervention in order to improve the following deficits and impairments:  Decreased knowledge of precautions, Increased edema, Postural dysfunction  Visit Diagnosis: Lymphedema, not elsewhere classified  Postmastectomy lymphedema syndrome     Problem List Patient Active Problem List   Diagnosis Date Noted   Lower extremity cellulitis 12/07/2017   Genetic testing 08/22/2017   Family history of breast cancer    Pneumothorax on right 07/18/2017   Malignant neoplasm of upper-outer quadrant of left breast in female, estrogen receptor positive ([place]) 06/30/2017     Otelia Limes, PTA 11/23/2021, 12:36 PM  [facility] @ [address], [place], [address] Phone: [phone]   Fax:  [phone]  Name: Kathleen Hanson MRN: [id number] Date of Birth: [birth year]

## 2021-11-27 ENCOUNTER — Ambulatory Visit: Payer: Medicare HMO | Attending: Hematology | Admitting: Physical Therapy

## 2021-11-27 ENCOUNTER — Other Ambulatory Visit: Payer: Self-pay

## 2021-11-27 ENCOUNTER — Encounter: Payer: Self-pay | Admitting: Physical Therapy

## 2021-11-27 DIAGNOSIS — M6281 Muscle weakness (generalized): Secondary | ICD-10-CM | POA: Diagnosis present

## 2021-11-27 DIAGNOSIS — G8929 Other chronic pain: Secondary | ICD-10-CM | POA: Diagnosis present

## 2021-11-27 DIAGNOSIS — R293 Abnormal posture: Secondary | ICD-10-CM | POA: Insufficient documentation

## 2021-11-27 DIAGNOSIS — Z483 Aftercare following surgery for neoplasm: Secondary | ICD-10-CM | POA: Diagnosis present

## 2021-11-27 DIAGNOSIS — I890 Lymphedema, not elsewhere classified: Secondary | ICD-10-CM | POA: Diagnosis not present

## 2021-11-27 DIAGNOSIS — I972 Postmastectomy lymphedema syndrome: Secondary | ICD-10-CM | POA: Insufficient documentation

## 2021-11-27 DIAGNOSIS — M25512 Pain in left shoulder: Secondary | ICD-10-CM | POA: Insufficient documentation

## 2021-11-27 NOTE — Therapy (Signed)
[facility] @ [address] [place], [place], [address] Phone: [phone]   Fax:  [phone]  Physical Therapy Treatment  Patient Details  Name: Kathleen Hanson MRN: [id number] Date of Birth: [birth year] Referring Provider (PT): Burr Medico   Encounter Date: 11/27/2021   PT End of Session - 11/27/21 1107     Visit Number 3    Number of Visits 12    Date for PT Re-Evaluation 12/09/21    Authorization Type [employer]    Authorization Time Period 12 visits    PT Start Time 1000    PT Stop Time 1055    PT Time Calculation (min) 55 min    Activity Tolerance Patient tolerated treatment well    Behavior During Therapy [facility] for tasks assessed/performed             Past Medical History:  Diagnosis Date   Cancer of left breast ([place])    Constipation    Family history of breast cancer    Fibrocystic breast    Hyperlipidemia    Ocular migraine    "used to have them monthly for a period of time; seemed to stop; now have had a couple in the last week" (07/13/2017)    Past Surgical History:  Procedure Laterality Date   BREAST BIOPSY Right ~ 2001   BREAST EXCISIONAL BIOPSY Right    BREAST LUMPECTOMY Left 2001   chemo and radiation   CESAREAN SECTION  X 1   IR CATHETER TUBE CHANGE  07/20/2017   MASTECTOMY COMPLETE / SIMPLE W/ SENTINEL NODE BIOPSY Left 07/13/2017    LEFT MASTECTOMY WITH SENTINEL NODE MAPPING ERAS PATHWAY /notes 07/13/2017   MASTECTOMY W/ SENTINEL NODE BIOPSY Left 07/13/2017   Procedure: LEFT MASTECTOMY WITH SENTINEL NODE [place];  Surgeon: [name], MD;  Location: [facility];  Service: General;  Laterality: Left;   PORT-A-CATH REMOVAL N/A 05/11/2018   Procedure: REMOVAL PORT-A-CATH;  Surgeon: Stark Klein, MD;  Location: [facility];  Service: General;  Laterality: N/A;   PORTACATH PLACEMENT Right 07/13/2017   PORTACATH PLACEMENT Right 07/13/2017   Procedure: INSERTION PORT-A-CATH;  Surgeon:  [name], MD;  Location: [facility];  Service: General;  Laterality: Right;   TONSILLECTOMY      There were no vitals filed for this visit.   Subjective Assessment - 11/27/21 1102     Subjective Pt was able to keep her bandages on for several days. She comes in with them washed.  She says she things she saw some improvement when she took the bandages off    Pertinent History Lt lumpectomy 2001 with chemo and radiation and then mastectomy 2018 with SLNB 0/2 with chemotherapy again.  Now just on anastrozole.  high cholesterol, developed acute shoulder pain     Patient Stated Goals Less swelling, be more independent    Currently in Pain? No/denies                       Flowsheet Row [facility]patient [facility]  Lymphedema Life Impact Scale Total Score 2.94 %             [facility] Adult PT Treatment/Exercise - 11/27/21 0001       Manual Therapy   Manual Therapy Manual Lymphatic Drainage (MLD);Compression Bandaging    Manual Lymphatic Drainage (MLD) In Supine: Short neck, superficial and deep abdominals, Lt inguinal and Rt axillary nodes, Lt  axillo-inguinal and anterior inter-axillary anastomosis, then focused on Lt UE working from proximal to distal then retracing all steps and beginning to review sequence with pt while performing    Compression Bandaging Cocoa butter applied to Lt UE, then applied TG soft,, white foam wrap  x1 from hand to axilla, then 1-6, and 1-10 cm short stretch compression bandages from hand to axilla                          PT Long Term Goals - 11/11/21 1359       PT LONG TERM GOAL #1   Title Pt will be independent in Self MLD    Time 4    Period Weeks    Status New      PT LONG TERM GOAL #2   Title Pt will demonstrate  1 - 1.5 cm reduction throughout the left forearm to decrease risk of infection.    Time 4    Period Weeks    Status New    Target Date 12/09/21       PT LONG TERM GOAL #3   Title Pt will be fit for new compression garments and will be independent in their use.    Time 4    Period Weeks    Status New    Target Date 12/09/21                   Plan - 11/27/21 1103     Clinical Impression Statement Pt continues with pitting lymphostatic lymphedema in her left forearm. No edema seen in hand or upper arm. She received a velcro garment when she was here several years ago and thinks she may still have that at home. She was asked to bring it in next time to see if she will be able to use that instead of bandaging to maintain her decreases in lymphedema    Personal Factors and Comorbidities Comorbidity 3+    Stability/Clinical Decision Making Stable/Uncomplicated    Rehab Potential Good    Clinical Impairments Affecting Rehab Potential Hx previous breast cancer on same side    PT Frequency 2x / week    PT Duration 4 weeks    PT Treatment/Interventions ADLs/Self Care Home Management;Patient/family education;Therapeutic exercise;Manual lymph drainage;Compression bandaging;Manual techniques;Taping;Orthotic Fit/Training    PT Next Visit Plan Cont left UE MLD and have pt return demo, compression bandaging, self MLD instruction  check to see if pt found velcro garment at home    Consulted and Agree with Plan of Care Patient             Patient will benefit from skilled therapeutic intervention in order to improve the following deficits and impairments:  Decreased knowledge of precautions, Increased edema, Postural dysfunction  Visit Diagnosis: Lymphedema, not elsewhere classified  Postmastectomy lymphedema syndrome  Abnormal posture     Problem List Patient Active Problem List   Diagnosis Date Noted   Lower extremity cellulitis 12/07/2017   Genetic testing 08/22/2017   Family history of breast cancer    Pneumothorax on right 07/18/2017   Malignant neoplasm of upper-outer quadrant of left breast in female, estrogen receptor  positive ([place]) 06/30/2017   Donato Heinz. Owens Shark PT  Norwood Levo, PT 11/27/2021, 11:08 AM  [facility] @ [address] [place] [place], [place], [address] Phone: [phone]   Fax:  [phone]  Name: Kathleen Hanson MRN: [id number] Date of Birth: [birth year]

## 2021-11-30 ENCOUNTER — Ambulatory Visit: Payer: Medicare HMO | Admitting: Rehabilitation

## 2021-11-30 ENCOUNTER — Other Ambulatory Visit: Payer: Self-pay

## 2021-11-30 ENCOUNTER — Encounter: Payer: Self-pay | Admitting: Rehabilitation

## 2021-11-30 DIAGNOSIS — M25512 Pain in left shoulder: Secondary | ICD-10-CM

## 2021-11-30 DIAGNOSIS — M6281 Muscle weakness (generalized): Secondary | ICD-10-CM

## 2021-11-30 DIAGNOSIS — Z483 Aftercare following surgery for neoplasm: Secondary | ICD-10-CM

## 2021-11-30 DIAGNOSIS — I972 Postmastectomy lymphedema syndrome: Secondary | ICD-10-CM

## 2021-11-30 DIAGNOSIS — G8929 Other chronic pain: Secondary | ICD-10-CM

## 2021-11-30 DIAGNOSIS — R293 Abnormal posture: Secondary | ICD-10-CM

## 2021-11-30 DIAGNOSIS — I890 Lymphedema, not elsewhere classified: Secondary | ICD-10-CM | POA: Diagnosis not present

## 2021-11-30 NOTE — Therapy (Signed)
[facility] @ [place], [place], [address] Phone: [phone]   Fax:  [phone]  Physical Therapy Treatment  Patient Details  Name: Kathleen Hanson MRN: [id number] Date of Birth: 1936-07-07 Referring Provider (PT): Burr Medico   Encounter Date: 11/30/2021   PT End of Session - 11/30/21 1214     Visit Number 4    Number of Visits 12    Date for PT Re-Evaluation 12/09/21    Authorization - Visit Number 4    Authorization - Number of Visits 12    PT Start Time 5027    PT Stop Time 1100    PT Time Calculation (min) 58 min    Activity Tolerance Patient tolerated treatment well    Behavior During Therapy [facility] for tasks assessed/performed             Past Medical History:  Diagnosis Date   Cancer of left breast ([place])    Constipation    Family history of breast cancer    Fibrocystic breast    Hyperlipidemia    Ocular migraine    "used to have them monthly for a period of time; seemed to stop; now have had a couple in the last week" (07/13/2017)    Past Surgical History:  Procedure Laterality Date   BREAST BIOPSY Right ~ 2001   BREAST EXCISIONAL BIOPSY Right    BREAST LUMPECTOMY Left 2001   chemo and radiation   CESAREAN SECTION  X 1   IR CATHETER TUBE CHANGE  07/20/2017   MASTECTOMY COMPLETE / SIMPLE W/ SENTINEL NODE BIOPSY Left 07/13/2017    LEFT MASTECTOMY WITH SENTINEL NODE MAPPING ERAS PATHWAY /notes 07/13/2017   MASTECTOMY W/ SENTINEL NODE BIOPSY Left 07/13/2017   Procedure: LEFT MASTECTOMY [place];  Surgeon: [name], MD;  Location: [facility];  Service: General;  Laterality: Left;   PORT-A-CATH REMOVAL N/A 05/11/2018   Procedure: REMOVAL PORT-A-CATH;  Surgeon: Stark Klein, MD;  Location: [facility];  Service: General;  Laterality: N/A;   PORTACATH PLACEMENT Right 07/13/2017   PORTACATH PLACEMENT Right 07/13/2017   Procedure: INSERTION PORT-A-CATH;   Surgeon: [name], MD;  Location: [facility];  Service: General;  Laterality: Right;   TONSILLECTOMY      There were no vitals filed for this visit.   Subjective Assessment - 11/30/21 1215     Subjective I did okay.  I have my velcro garment today.  I remember not liking it very well.  My ultimate goal is to get in to a regular sleeve.    Pertinent History Lt lumpectomy 2001 with chemo and radiation and then mastectomy 2018 with SLNB 0/2 with chemotherapy again.  Now just on anastrozole.  high cholesterol, developed acute shoulder pain     Patient Stated Goals Less swelling, be more independent    Currently in Pain? No/denies                   LYMPHEDEMA/ONCOLOGY QUESTIONNAIRE - 11/30/21 0001       Left Upper Extremity Lymphedema   Olecranon Process 23.5 cm    15 cm Proximal to Ulnar Styloid Process 22 cm    10 cm Proximal to Ulnar Styloid Process 18.4 cm    Just Proximal to Ulnar Styloid Process 15 cm                Flowsheet Row [facility]  Lymphedema Life Impact Scale Total Score 2.94 %             [facility] Adult PT Treatment/Exercise - 11/30/21 0001       Manual Therapy   Manual therapy comments remeasured Lt UE which is smaller in the forearm.  Pt brought in her old vecro sleeve and handpiece which was donned and then trimmed a bit to fit better.  Pt was educated on use and donning and doffing x 1 each. Pt would like to attempt to use this instead of bandaging.    Manual Lymphatic Drainage (MLD) In Supine: Short neck, superficial and deep abdominals, Lt inguinal and Rt axillary nodes, Lt axillo-inguinal and anterior inter-axillary anastomosis, then focused on Lt UE working from proximal to distal then retracing all steps and beginning to review sequence with pt while performing                          PT Long Term Goals - 11/11/21 1359       PT LONG TERM GOAL #1    Title Pt will be independent in Self MLD    Time 4    Period Weeks    Status New      PT LONG TERM GOAL #2   Title Pt will demonstrate  1 - 1.5 cm reduction throughout the left forearm to decrease risk of infection.    Time 4    Period Weeks    Status New    Target Date 12/09/21      PT LONG TERM GOAL #3   Title Pt will be fit for new compression garments and will be independent in their use.    Time 4    Period Weeks    Status New    Target Date 12/09/21                   Plan - 11/30/21 1217     Clinical Impression Statement Pt continues with lymphedema in her left forearm but reduced since evaluation.  Pt found her old velcro garment and will try this instead of bandaging but knows we can switch back to bandaging if needed.  Pt may be ready for regular sleeve soon.  Discussed juzo soft vs dynamic as pt likes her soft just reported that is was a bit too big at the upper arm.  Also discussed what funds will be needed for sleeve.    PT Frequency 2x / week    PT Duration 4 weeks    PT Treatment/Interventions ADLs/Self Care Home Management;Patient/family education;Therapeutic exercise;Manual lymph drainage;Compression bandaging;Manual techniques;Taping;Orthotic Fit/Training    PT Next Visit Plan Cont left UE MLD and have pt return demo (give handout or new video link as pt has old DVD version), compression bandaging or velcro    Consulted and Agree with Plan of Care Patient             Patient will benefit from skilled therapeutic intervention in order to improve the following deficits and impairments:     Visit Diagnosis: Lymphedema, not elsewhere classified  Postmastectomy lymphedema syndrome  Abnormal posture  Chronic left shoulder pain  Muscle weakness (generalized)  Aftercare following surgery for neoplasm  Left shoulder pain, unspecified chronicity     Problem List Patient Active Problem List   Diagnosis Date Noted   Lower extremity  cellulitis 12/07/2017   Genetic testing 08/22/2017   Family history of breast cancer  Pneumothorax on right 07/18/2017   Malignant neoplasm of upper-outer quadrant of left breast in female, estrogen receptor positive ([place]) 06/30/2017    Stark Bray, PT 11/30/2021, 12:19 PM  [facility] @ [place], [place], [address] Phone: [phone]   Fax:  [phone]  Name: Kathleen Hanson MRN: [id number] Date of Birth: 07-28-1936

## 2021-12-02 ENCOUNTER — Ambulatory Visit: Payer: Medicare HMO

## 2021-12-02 ENCOUNTER — Other Ambulatory Visit: Payer: Self-pay

## 2021-12-02 DIAGNOSIS — I890 Lymphedema, not elsewhere classified: Secondary | ICD-10-CM

## 2021-12-02 DIAGNOSIS — I972 Postmastectomy lymphedema syndrome: Secondary | ICD-10-CM

## 2021-12-02 NOTE — Therapy (Signed)
[facility] @ [address] [place], [place], [address] Phone: [phone]   Fax:  [phone]  Physical Therapy Treatment  Patient Details  Name: Kathleen Hanson MRN: [id number] Date of Birth: Nov 12, 1935 Referring Provider (PT): Burr Medico   Encounter Date: 12/02/2021   PT End of Session - 12/02/21 1103     Visit Number 5    Number of Visits 12    Date for PT Re-Evaluation 12/09/21    Authorization Type [employer]    Authorization Time Period 12 visits    Authorization - Visit Number 5    Authorization - Number of Visits 12    PT Start Time 1001    PT Stop Time 1100    PT Time Calculation (min) 59 min    Activity Tolerance Patient tolerated treatment well    Behavior During Therapy [facility] for tasks assessed/performed             Past Medical History:  Diagnosis Date   Cancer of left breast ([facility])    Constipation    Family history of breast cancer    Fibrocystic breast    Hyperlipidemia    Ocular migraine    "used to have them monthly for a period of time; seemed to stop; now have had a couple in the last week" (07/13/2017)    Past Surgical History:  Procedure Laterality Date   BREAST BIOPSY Right ~ 2001   BREAST EXCISIONAL BIOPSY Right    BREAST LUMPECTOMY Left 2001   chemo and radiation   CESAREAN SECTION  X 1   IR CATHETER TUBE CHANGE  07/20/2017   MASTECTOMY COMPLETE / SIMPLE W/ SENTINEL NODE BIOPSY Left 07/13/2017    LEFT MASTECTOMY WITH SENTINEL NODE MAPPING ERAS PATHWAY /notes 07/13/2017   MASTECTOMY W/ SENTINEL NODE BIOPSY Left 07/13/2017   Procedure: LEFT MASTECTOMY WITH SENTINEL NODE [place];  Surgeon: [name], MD;  Location: [facility];  Service: General;  Laterality: Left;   PORT-A-CATH REMOVAL N/A 05/11/2018   Procedure: REMOVAL PORT-A-CATH;  Surgeon: Stark Klein, MD;  Location: [facility];  Service: General;  Laterality: N/A;   PORTACATH PLACEMENT Right 07/13/2017    PORTACATH PLACEMENT Right 07/13/2017   Procedure: INSERTION PORT-A-CATH;  Surgeon: [name], MD;  Location: [facility];  Service: General;  Laterality: Right;   TONSILLECTOMY      There were no vitals filed for this visit.   Subjective Assessment - 12/02/21 1001     Subjective I wore the velcro when I left on Monday. At some point it started to bother my elbow so I took it off on Tuesday am. I tried to try it on myself later that day. I put the liner sleeve on and at some point the web space of my thumb was really hurting so I took it off and left it off    Pertinent History Lt lumpectomy 2001 with chemo and radiation and then mastectomy 2018 with SLNB 0/2 with chemotherapy again.  Now just on anastrozole.  high cholesterol, developed acute shoulder pain     Patient Stated Goals Less swelling, be more independent    Currently in Pain? No/denies    Pain Score 0-No pain    Multiple Pain Sites No                   LYMPHEDEMA/ONCOLOGY QUESTIONNAIRE - 12/02/21 0001       Left Upper Extremity Lymphedema   15 cm  Proximal to Olecranon Process 26 cm    10 cm Proximal to Olecranon Process 24.5 cm    Olecranon Process 24.2 cm    15 cm Proximal to Ulnar Styloid Process 21.7 cm    10 cm Proximal to Ulnar Styloid Process 18.9 cm    Just Proximal to Ulnar Styloid Process 14.2 cm    Across Hand at [employer] 17.4 cm    At [place] of 2nd Digit 5.9 cm                Flowsheet Row Outpatient [facility]  Lymphedema Life Impact Scale Total Score 2.94 %             [facility] Adult PT Treatment/Exercise - 12/02/21 0001       Manual Therapy   Manual therapy comments remeasured left UE    Manual Lymphatic Drainage (MLD) In Supine: Short neck, superficial and deep abdominals, Lt inguinal and Rt axillary nodes, Lt axillo-inguinal and anterior inter-axillary anastomosis, then focused on Lt UE working from proximal to distal  then retracing all steps and reviewd each step with pt performing . Gave pt handout    Compression Bandaging lotion applied then pt assisted with donning velcro wraps. Showed how to use the table to support her arm. Small piece of comprilan foam at antecubital foss                         PT Long Term Goals - 11/11/21 1359       PT LONG TERM GOAL #1   Title Pt will be independent in Self MLD    Time 4    Period Weeks    Status New      PT LONG TERM GOAL #2   Title Pt will demonstrate  1 - 1.5 cm reduction throughout the left forearm to decrease risk of infection.    Time 4    Period Weeks    Status New    Target Date 12/09/21      PT LONG TERM GOAL #3   Title Pt will be fit for new compression garments and will be independent in their use.    Time 4    Period Weeks    Status New    Target Date 12/09/21                   Plan - 12/02/21 1252     Clinical Impression Statement Pt removed velcro wrap the day she was here because it go uncomfortable at the elbow and at the thumb web space. She took a picture and the elbow was very red.  She reapplied it the next day but still felt uncomfortable, and removed it.  She wants to continue to try the velcro wrap and she was instructed again today in the proper way to apply it using the table to stretch her arm out. We applied the sleeve first, and put a thin piece of comprilan soft at elbow region and then the wrap from wrist to axilla and the hand piece at the end so she can remove and fix if it gets uncomfortable at the web space. MLD was performed by PT and also practiced by pt.  she did very well with technique and seemed to have a good understanding of sequence.  She was given a handout for MLD to go by. Arm measurements had decreased slightly in some places when  measured but forearm continues with significant swelling.    Personal Factors and Comorbidities Comorbidity 3+    Comorbidities hx of left lumpectomy,  left mastectomy, radiation    Stability/Clinical Decision Making Stable/Uncomplicated    Rehab Potential Good    Clinical Impairments Affecting Rehab Potential Hx previous breast cancer on same side    PT Frequency 2x / week    PT Duration 4 weeks    PT Treatment/Interventions ADLs/Self Care Home Management;Patient/family education;Therapeutic exercise;Manual lymph drainage;Compression bandaging;Manual techniques;Taping;Orthotic Fit/Training    PT Next Visit Plan Cont left UE MLD and have pt return demo (gave handout ), compression bandaging or velcro    Consulted and Agree with Plan of Care Patient             Patient will benefit from skilled therapeutic intervention in order to improve the following deficits and impairments:  Decreased knowledge of precautions, Increased edema, Postural dysfunction  Visit Diagnosis: Lymphedema, not elsewhere classified  Postmastectomy lymphedema syndrome     Problem List Patient Active Problem List   Diagnosis Date Noted   Lower extremity cellulitis 12/07/2017   Genetic testing 08/22/2017   Family history of breast cancer    Pneumothorax on right 07/18/2017   Malignant neoplasm of upper-outer quadrant of left breast in female, estrogen receptor positive ([facility]) 06/30/2017    Claris Pong, PT 12/02/2021, 12:57 PM  [facility] @ [place], [place], [address] Phone: [phone]   Fax:  [phone]  Name: Kathleen Hanson MRN: [id number] Date of Birth: [birth year]

## 2021-12-04 ENCOUNTER — Encounter: Payer: Self-pay | Admitting: Rehabilitation

## 2021-12-04 ENCOUNTER — Other Ambulatory Visit: Payer: Self-pay

## 2021-12-04 ENCOUNTER — Ambulatory Visit: Payer: Medicare HMO | Admitting: Rehabilitation

## 2021-12-04 DIAGNOSIS — G8929 Other chronic pain: Secondary | ICD-10-CM

## 2021-12-04 DIAGNOSIS — R293 Abnormal posture: Secondary | ICD-10-CM

## 2021-12-04 DIAGNOSIS — I890 Lymphedema, not elsewhere classified: Secondary | ICD-10-CM | POA: Diagnosis not present

## 2021-12-04 DIAGNOSIS — I972 Postmastectomy lymphedema syndrome: Secondary | ICD-10-CM

## 2021-12-04 DIAGNOSIS — M25512 Pain in left shoulder: Secondary | ICD-10-CM

## 2021-12-04 NOTE — Therapy (Addendum)
 [facility] @ [facility] [address] [place], [place], [address] Phone: [phone]   Fax:  [phone]  Physical Therapy Treatment  Patient Details  Name: Kathleen Hanson MRN: [id number] Date of Birth: 1936/03/05 Referring Provider (PT): Mosetta Putt   Encounter Date: 12/04/2021   PT End of Session - 12/04/21 1008     Visit Number 6    Number of Visits 12    Date for PT Re-Evaluation 12/09/21    PT Start Time 1008    PT Stop Time 1100    PT Time Calculation (min) 52 min    Activity Tolerance Patient tolerated treatment well    Behavior During Therapy [facility] for tasks assessed/performed             Past Medical History:  Diagnosis Date   Cancer of left breast ([facility])    Constipation    Family history of breast cancer    Fibrocystic breast    Hyperlipidemia    Ocular migraine    "used to have them monthly for a period of time; seemed to stop; now have had a couple in the last week" (07/13/2017)    Past Surgical History:  Procedure Laterality Date   BREAST BIOPSY Right ~ 2001   BREAST EXCISIONAL BIOPSY Right    BREAST LUMPECTOMY Left 2001   chemo and radiation   CESAREAN SECTION  X 1   IR CATHETER TUBE CHANGE  07/20/2017   MASTECTOMY COMPLETE / SIMPLE W/ SENTINEL NODE BIOPSY Left 07/13/2017    LEFT MASTECTOMY WITH SENTINEL NODE MAPPING ERAS PATHWAY /notes 07/13/2017   MASTECTOMY W/ SENTINEL NODE BIOPSY Left 07/13/2017   Procedure: LEFT MASTECTOMY WITH SENTINEL NODE MAPPING ERAS PATHWAY;  Surgeon: Griselda Miner, MD;  [facility];  Service: General;  Laterality: Left;   PORT-A-CATH REMOVAL N/A 05/11/2018   Procedure: REMOVAL PORT-A-CATH;  Surgeon: Almond Lint, MD;  Location: [facility];  Service: General;  Laterality: N/A;   PORTACATH PLACEMENT Right 07/13/2017   PORTACATH PLACEMENT Right 07/13/2017   Procedure: INSERTION PORT-A-CATH;  Surgeon: Griselda Miner, MD;  [facility];  Service: General;  Laterality:  Right;   TONSILLECTOMY      There were no vitals filed for this visit.   Subjective Assessment - 12/04/21 1008     Subjective My elbow is still bad with the velcro.  I think I need to stop today due to cost.I would like you to review the self bandaging and I will just go get my new sleeves    Currently in Pain? No/denies                       Flowsheet Row Outpatient Re[facility] from [facility]24/[facility]  Lymphedema Life Impact Scale Total Score 2.94 %             [facility] Adult PT Treatment/Exercise - 12/04/21 0001       Self-Care   Self-Care Other Self-Care Comments    Other Self-Care Comments  Reviewed all handouts that pt has received since 2019 including multiple printouts of MLD and bandaging.  Omitted duplicates.  Reviewed self bandging from previous handout which does not include fingers and starts with 6cm hand bandage.  PT reviewed each step and then pt performed with cueing as needed.  Cueing for amount of overlap and tension mainly needed.  Pt did moderately well overall with difficulty reaching the upper arm to end the  bandage high enough.  Pt was re-educated on safety, when to remove the bandages, bandage care, and how she can use the velcro in place of the bandaging and that she does not have to do either when she gets her real sleeve.  PT reapplied upon leaving.                          PT Long Term Goals - 12/04/21 1158       PT LONG TERM GOAL #1   Title Pt will be independent in Self MLD    Status Achieved      PT LONG TERM GOAL #2   Title Pt will demonstrate  1 - 1.5 cm reduction throughout the left forearm to decrease risk of infection.    Status Achieved      PT LONG TERM GOAL #3   Title Pt will be fit for new compression garments and will be independent in their use.    Status Not Met      PT LONG TERM GOAL #4   Title Pt will decrease quick DASH score to < 12%0 indicating an improvement  in functional use of left arm    Status Deferred                   Plan - 12/04/21 1156     Clinical Impression Statement Pt requests DC due to cost and would like to continue with self bandaging, velcro, and getting new sleeve [facility] as soon as possible.  Pt did well with self bandaging but has been taught this at least 2 times in the past and has not been able to follow through.  Pt also has a velcro garment which was emphasized as being able to use this instead.  Reminded pt of all safety concerns and when to remove bandage.  Pt knows she can return if needed for follow up.    PT Frequency 2x / week    PT Duration 4 weeks    PT Treatment/Interventions ADLs/Self Care Home Management;Patient/family education;Therapeutic exercise;Manual lymph drainage;Compression bandaging;Manual techniques;Taping;Orthotic Fit/Training    PT Next Visit Plan DC if not return    Consulted and Agree with Plan of Care Patient             Patient will benefit from skilled therapeutic intervention in order to improve the following deficits and impairments:     Visit Diagnosis: Lymphedema, not elsewhere classified  Postmastectomy lymphedema syndrome  Abnormal posture  Chronic left shoulder pain     Problem List Patient Active Problem List   Diagnosis Date Noted   Lower extremity cellulitis 12/07/2017   Genetic testing 08/22/2017   Family history of breast cancer    Pneumothorax on right 07/18/2017   Malignant neoplasm of upper-outer quadrant of left breast in female, estrogen receptor positive ([facility]) 06/30/2017    Idamae Lusher, PT 12/04/2021, 11:58 AM  [facility] @ [facility] [address] [place], [place], [address] Phone: [phone]   Fax:  [phone]  Name: Kathleen Hanson MRN: [id number] Date of Birth: 1936-04-28   PHYSICAL THERAPY DISCHARGE SUMMARY  Visits from Start of Care: 6  Current functional level  related to goals / functional outcomes: Per above   Remaining deficits: Chronic lymphedema    Education / Equipment: Self care HEP  Plan: Patient agrees to discharge.  Patient goals were partially met.

## 2021-12-08 ENCOUNTER — Other Ambulatory Visit: Payer: Self-pay | Admitting: Nurse Practitioner

## 2021-12-08 DIAGNOSIS — C50412 Malignant neoplasm of upper-outer quadrant of left female breast: Secondary | ICD-10-CM

## 2021-12-11 ENCOUNTER — Encounter: Payer: Medicare HMO | Admitting: Rehabilitation

## 2021-12-14 ENCOUNTER — Encounter: Payer: Medicare HMO | Admitting: Rehabilitation

## 2021-12-18 ENCOUNTER — Encounter: Payer: Medicare HMO | Admitting: Rehabilitation

## 2021-12-22 ENCOUNTER — Encounter: Payer: Self-pay | Admitting: Physician Assistant

## 2021-12-22 ENCOUNTER — Other Ambulatory Visit: Payer: Self-pay

## 2021-12-22 ENCOUNTER — Ambulatory Visit: Payer: Medicare HMO | Admitting: Physician Assistant

## 2021-12-22 DIAGNOSIS — L720 Epidermal cyst: Secondary | ICD-10-CM

## 2021-12-22 DIAGNOSIS — Z1283 Encounter for screening for malignant neoplasm of skin: Secondary | ICD-10-CM

## 2021-12-22 DIAGNOSIS — Z85828 Personal history of other malignant neoplasm of skin: Secondary | ICD-10-CM | POA: Diagnosis not present

## 2021-12-22 DIAGNOSIS — Z86018 Personal history of other benign neoplasm: Secondary | ICD-10-CM | POA: Diagnosis not present

## 2021-12-22 NOTE — Patient Instructions (Signed)
Seborrheic Keratosis A seborrheic keratosis is a common, noncancerous (benign) skin growth. These growths are velvety, waxy, rough, tan, brown, or black spots that appear on the skin. These skin growths can be flat or raised, andscaly. What are the causes? The cause of this condition is not known. What increases the risk? You are more likely to develop this condition if you: Have a family history of seborrheic keratosis. Are 50 or older. Are pregnant. Have had estrogen replacement therapy. What are the signs or symptoms? Symptoms of this condition include growths on the face, chest, shoulders, back, or other areas. These growths: Are usually painless, but may become irritated and itchy. Can be yellow, brown, black, or other colors. Are slightly raised or have a flat surface. Are sometimes rough or wart-like in texture. Are often velvety or waxy on the surface. Are round or oval-shaped. Often occur in groups, but may occur as a single growth. How is this diagnosed? This condition is diagnosed with a medical history and physical exam. A sample of the growth may be tested (skin biopsy). You may need to see a skin specialist (dermatologist). How is this treated? Treatment is not usually needed for this condition, unless the growths are irritated or bleed often. You may also choose to have the growths removed if you do not like their appearance. Most commonly, these growths are treated with a procedure in which liquid nitrogen is applied to "freeze" off the growth (cryosurgery). They may also be burned off with electricity (electrocautery) or removed by scraping (curettage). Follow these instructions at home: Watch your growth for any changes. Keep all follow-up visits as told by your health care provider. This is important. Do not scratch or pick at the growth or growths. This can cause them to become irritated or infected. Contact a health care provider if: You suddenly have many new  growths. Your growth bleeds, itches, or hurts. Your growth suddenly becomes larger or changes color. Summary A seborrheic keratosis is a common, noncancerous (benign) skin growth. Treatment is not usually needed for this condition, unless the growths are irritated or bleed often. Watch your growth for any changes. Contact a health care provider if you suddenly have many new growths or your growth suddenly becomes larger or changes color. Keep all follow-up visits as told by your health care provider. This is important. This information is not intended to replace advice given to you by your health care provider. Make sure you discuss any questions you have with your healthcare provider. Document Revised: 02/23/2018 Document Reviewed: 02/23/2018 Elsevier Patient Education  2022 Elsevier Inc.  

## 2022-01-11 ENCOUNTER — Encounter: Payer: Self-pay | Admitting: Physician Assistant

## 2022-01-11 NOTE — Progress Notes (Signed)
° °  Follow-Up Visit   Subjective  Kathleen Hanson is a 86 y.o. female who presents for the following: Follow-up (Patient here today for 6 month skin check. Per patient she does have a few lesions that she would like to ask about. Personal history of atypical moles, melanoma or non mole skin cancer. No family history of atypical moles).   The following portions of the chart were reviewed this encounter and updated as appropriate:  Tobacco  Allergies  Meds  Problems  Med Hx  Surg Hx  Fam Hx      Objective  Well appearing patient in no apparent distress; mood and affect are within normal limits.  A full examination was performed including scalp, head, eyes, ears, nose, lips, neck, chest, axillae, abdomen, back, buttocks, bilateral upper extremities, bilateral lower extremities, hands, feet, fingers, toes, fingernails, and toenails. All findings within normal limits unless otherwise noted below.  Head to toe No atypical nevi No signs of non-mole skin cancer. No swollen lymph  nodes. Dyspigmented melanoma scar is clear.  Left Malar Cheek, Right Malar Cheek Smooth white papules   Assessment & Plan  Skin exam for malignant neoplasm Head to toe  6 month skin exam due to her recent melanoma dianosis  Mil[place] (2) Left Malar Cheek; Right Malar Cheek  Observe.    I, Milt Coye, PA-C, have reviewed all documentation's for this visit.  The documentation on 01/11/22 for the exam, diagnosis, procedures and orders are all accurate and complete.

## 2022-02-03 ENCOUNTER — Telehealth: Payer: Self-pay | Admitting: Hematology

## 2022-02-03 NOTE — Telephone Encounter (Signed)
.  Called pt per 4/12 inbasket , Patient was unavailable, a message with appt time and date was left with number on file.  ? ?

## 2022-02-05 ENCOUNTER — Other Ambulatory Visit: Payer: Medicare HMO

## 2022-02-05 ENCOUNTER — Ambulatory Visit: Payer: Medicare HMO | Admitting: Hematology

## 2022-03-05 ENCOUNTER — Inpatient Hospital Stay: Payer: Medicare HMO | Admitting: Hematology

## 2022-03-05 ENCOUNTER — Inpatient Hospital Stay: Payer: Medicare HMO

## 2022-03-09 ENCOUNTER — Other Ambulatory Visit: Payer: Self-pay

## 2022-03-09 ENCOUNTER — Inpatient Hospital Stay: Payer: Medicare HMO | Admitting: Hematology

## 2022-03-09 ENCOUNTER — Encounter: Payer: Self-pay | Admitting: Hematology

## 2022-03-09 ENCOUNTER — Inpatient Hospital Stay: Payer: Medicare HMO | Attending: Hematology

## 2022-03-09 VITALS — BP 160/63 | HR 55 | Temp 98.2°F | Resp 19 | Ht 64.0 in | Wt 130.2 lb

## 2022-03-09 DIAGNOSIS — Z79811 Long term (current) use of aromatase inhibitors: Secondary | ICD-10-CM | POA: Diagnosis not present

## 2022-03-09 DIAGNOSIS — Z170 Estrogen receptor positive status [ER+]: Secondary | ICD-10-CM | POA: Diagnosis not present

## 2022-03-09 DIAGNOSIS — Z1231 Encounter for screening mammogram for malignant neoplasm of breast: Secondary | ICD-10-CM

## 2022-03-09 DIAGNOSIS — C50412 Malignant neoplasm of upper-outer quadrant of left female breast: Secondary | ICD-10-CM | POA: Diagnosis present

## 2022-03-09 DIAGNOSIS — Z79899 Other long term (current) drug therapy: Secondary | ICD-10-CM | POA: Diagnosis not present

## 2022-03-09 DIAGNOSIS — I890 Lymphedema, not elsewhere classified: Secondary | ICD-10-CM | POA: Insufficient documentation

## 2022-03-09 DIAGNOSIS — M85852 Other specified disorders of bone density and structure, left thigh: Secondary | ICD-10-CM | POA: Diagnosis not present

## 2022-03-09 DIAGNOSIS — I10 Essential (primary) hypertension: Secondary | ICD-10-CM | POA: Diagnosis not present

## 2022-03-09 DIAGNOSIS — Z1321 Encounter for screening for nutritional disorder: Secondary | ICD-10-CM

## 2022-03-09 DIAGNOSIS — E2839 Other primary ovarian failure: Secondary | ICD-10-CM

## 2022-03-09 LAB — CBC WITH DIFFERENTIAL/PLATELET
Abs Immature Granulocytes: 0.01 10*3/uL (ref 0.00–0.07)
Basophils Absolute: 0 10*3/uL (ref 0.0–0.1)
Basophils Relative: 1 %
Eosinophils Absolute: 0.1 10*3/uL (ref 0.0–0.5)
Eosinophils Relative: 3 %
HCT: 42.2 % (ref 36.0–46.0)
Hemoglobin: 14.2 g/dL (ref 12.0–15.0)
Immature Granulocytes: 0 %
Lymphocytes Relative: 19 %
Lymphs Abs: 0.8 10*3/uL (ref 0.7–4.0)
MCH: 30.4 pg (ref 26.0–34.0)
MCHC: 33.6 g/dL (ref 30.0–36.0)
MCV: 90.4 fL (ref 80.0–100.0)
Monocytes Absolute: 0.3 10*3/uL (ref 0.1–1.0)
Monocytes Relative: 8 %
Neutro Abs: 2.8 10*3/uL (ref 1.7–7.7)
Neutrophils Relative %: 69 %
Platelets: 199 10*3/uL (ref 150–400)
RBC: 4.67 MIL/uL (ref 3.87–5.11)
RDW: 13.1 % (ref 11.5–15.5)
WBC: 4.1 10*3/uL (ref 4.0–10.5)
nRBC: 0 % (ref 0.0–0.2)

## 2022-03-09 LAB — COMPREHENSIVE METABOLIC PANEL
ALT: 15 U/L (ref 0–44)
AST: 15 U/L (ref 15–41)
Albumin: 4.3 g/dL (ref 3.5–5.0)
Alkaline Phosphatase: 76 U/L (ref 38–126)
Anion gap: 7 (ref 5–15)
BUN: 15 mg/dL (ref 8–23)
CO2: 29 mmol/L (ref 22–32)
Calcium: 9.3 mg/dL (ref 8.9–10.3)
Chloride: 102 mmol/L (ref 98–111)
Creatinine, Ser: 0.58 mg/dL (ref 0.44–1.00)
GFR, Estimated: >60 mL/min (ref >60)
Glucose, Bld: 100 mg/dL — ABNORMAL HIGH (ref 70–99)
Potassium: 3.7 mmol/L (ref 3.5–5.1)
Sodium: 138 mmol/L (ref 135–145)
Total Bilirubin: 0.6 mg/dL (ref 0.3–1.2)
Total Protein: 6.8 g/dL (ref 6.5–8.1)

## 2022-03-09 LAB — VITAMIN D 25 HYDROXY (VIT D DEFICIENCY, FRACTURES): Vit D, 25-Hydroxy: 42.27 ng/mL (ref 30–100)

## 2022-03-09 NOTE — Progress Notes (Signed)
?[place]   ?[phone] [phone]   ?Clinic Follow up Note  ? ?Patient Care Team: ?Bernerd Limbo, MD as PCP - General (Family Medicine) ?[name], MD as Consulting Physician (General Surgery) ?[name], MD as Consulting Physician (Hematology) ?[name], MD as Consulting Physician (Radiation Oncology) ?Gardenia Phlegm, NP as Nurse Practitioner (Hematology and Oncology) ?Starlyn Skeans as Librarian, academic (Dermatology) ? ?Date of Service:  03/09/2022 ? ?CHIEF COMPLAINT: f/u of left breast cancer ? ?CURRENT THERAPY:  ?-Adjuvant Anastrozole daily starting 03/13/18 ?-Fosamax 55m weekly starting in 11/2019  ? ?ASSESSMENT & PLAN:  ?Kathleen KUNTZMANis a 86y.o. post-menopausal female with  ? ?1. Malignant neoplasm of upper-outer quadrant of left breast in female, invasive ductal carcinoma, pT2N0M0, Stage: 1A, triple positive, Grade 3 ?-diagnosed in 06/2017; s/p left mastectomy, adjuvant chemo TCH and maintenance Herceptin.  ?-Given her prior radiation, additional treatment not recommended. ?-She declined oral Her2 antibody Neratinib due to concerns of side effects.  ?-She started anti-estrogen therapy with Anastrozole in 02/2018. She is tolerating well overall, will continue for 5-10 years.  ?-right mammogram on 07/03/21 was unremarkable ?-She is clinically doing well, exam was unremarkable, no clinical concern for recurrence. Labs reviewed, CBC and CMP WNL, Vit D pending.  ?-Follow-up in 6 months ?  ?2. History of invasive and in situ ductal carcinoma of left breast, 10/26/1999, Estrogen receptor negative, stage 1, Grade 3 ?-[place] ?-s/p Lumpectomy (sentinel node negative, 2-3 removed), chemo, radiation ?  ?3. Osteopenia  ?-10/2019 DEXA shows Osteopenia recurrence with lowest T-score at -1.5 at left hip. She will be due for repeat this coming year, plan to do with next mammogram. ?-NP Lacie offered bisphosphonate with Zometa but she  opted to restart Fosamax 768mweekly in 11/2019. She is tolerating well.  ?  ?4. Hypertension, White Coat Syndrome ?-Has been high when she comes to clinic. BP overall stable at 160/63 today (03/09/22) ?-f/u with PCP  ? ?  ?PLAN: ?-Continue anastrozole ?-will plan for DEXA with mammogram in 06/2022, ordered today  ?-Lab and f/u in 6 months ? ? ?No problem-specific Assessment & Plan notes found for this encounter. ? ? ?SUMMARY OF ONCOLOGIC HISTORY: ?Oncology History Overview Note  ?Cancer Staging ?Malignant neoplasm of upper-outer quadrant of left breast in female, estrogen receptor positive ([facility]?Staging form: Breast, AJCC 8th Edition ?- Clinical stage from 07/06/2017: Stage IA (cT1c, cN0, cM0, G3, ER: Positive, PR: Positive, HER2: Positive) - Unsigned ?Staging comments: Staged at breast conference 9.12.18 ?- Pathologic stage from 07/13/2017: Stage IA (pT2, pN0, cM0, G3, ER: Positive, PR: Positive, HER2: Positive) - Signed by FeTruitt MerleMD on 08/16/2017 ? ? ?  ?Malignant neoplasm of upper-outer quadrant of left breast in female, estrogen receptor positive ([facility] ?06/23/2017 Mammogram  ? Diagnostic mammogram 06/23/17 ?IMPRESSION: ?Indeterminate mass with irregular margins at the 2:30 position 4 cm ?from nipple measuring 1.4 x 0.8 x 1.2 cm in the upper-outer left breast. ? ?  ?06/28/2017 Initial Biopsy  ? Diagnosis 06/28/17 ?Breast, left, needle core biopsy, upper outer quadrant, 2:30 o'clock, 4cm from nipple ?- INVASIVE MAMMARY CARCINOMA WITH CALCIFICATIONS, SEE COMMENT. ? ?  ?06/28/2017 Receptors her2  ? Estrogen Receptor: 100%, POSITIVE, STRONG STAINING INTENSITY ?Progesterone Receptor: 20%, POSITIVE, STRONG STAINING INTENSITY ?Proliferation Marker Ki67: 25% ?HER2: Postive ? ? ?  ?06/30/2017 Initial Diagnosis  ? Malignant neoplasm of upper-outer quadrant of left breast in female, estrogen receptor positive ([facility]? ?  ?07/13/2017 Surgery  ?  Left mastectomy with SLN biopsy performed by Dr Marlou Starks.  ? ?  ?07/13/2017 Pathology Results  ?  Diagnosis  ?1. Breast, simple mastectomy, Left ?- INVASIVE AND IN SITU DUCTAL CARCINOMA, 4 CM, MSBR GRADE 3. ?- MARGINS NOT INVOLVED. ?- SEE ONCOLOGY TABLE. ?2. Lymph node, sentinel, biopsy, Left #1 ?- ONE BENIGN LYMPH NODE (0/1). ?3. Lymph node, biopsy, Left #2 ?- ONE BENIGN LYMPH NODE (0/1). ? ?  ?07/18/2017 - 07/27/2017 Hospital Admission  ? Admit date: 07/18/17 ?Admission diagnosis: spontaneous PNX ?Additional comments: the patient was admitted with a pneumothorax several days after a port was placed although her post procedure cxr was normal. She had a chest tube placed but had a persistent air leak. The pleurivac was placed to 40 cm h2o suction for several days and the pneumothorax finally resolved.  ? ?  ?08/15/2017 Imaging  ? CT A/P  ?IMPRESSION: ?1. No CT findings to suggest metastatic disease involving the ?abdomen/pelvis. ?2. Left chest wall fluid collection as discussed above. ?3. Small right pleural effusion with minimal overlying atelectasis. ?4. Cholelithiasis. ?5. Small periumbilical abdominal wall hernia containing fat. ?  ? ?  ?08/15/2017 Imaging  ? NM Whole Body Bone Scan ?FINDINGS: ?Uptake at the LEFT lateral aspects of the cervical and lower ?thoracic spine, typically degenerative. ?  ?Uptake at the shoulders, elbows, wrists, hips, and knees, typically ?degenerative. ?  ?Asymmetric uptake at the sternoclavicular joints RIGHT greater than ?LEFT which may also be degenerative. ?  ?No definite foci of abnormal osseous tracer accumulation are ?identified which are suspicious for osseous metastases. ?  ?Biconvex thoracolumbar scoliosis. ?  ?Expected urinary tract and soft tissue distribution of tracer. ?  ?IMPRESSION: ?Scattered likely degenerative type uptake as above. ?  ?No definite scintigraphic evidence of osseous metastatic disease. ? ?  ?08/16/2017 Genetic Testing  ? The patient had genetic testing due to a personal and family history of breast cancer.  The [employer] Multi-Cancer Panel was  ordered. The Multi-Cancer Panel offered by [facility] includes sequencing and/or deletion duplication testing of the following 83 genes: ALK, APC, ATM, AXIN2,BAP1,  BARD1, BLM, BMPR1A, BRCA1, BRCA2, BRIP1, CASR, CDC73, CDH1, CDK4, CDKN1B, CDKN1C, CDKN2A (p14ARF), CDKN2A (p16INK4a), CEBPA, CHEK2, CTNNA1, DICER1, DIS3L2, EGFR (c.2369C>T, p.Thr790Met variant only), EPCAM (Deletion/duplication testing only), FH, FLCN, GATA2, GPC3, GREM1 (Promoter region deletion/duplication testing only), HOXB13 (c.251G>A, p.Gly84Glu), HRAS, KIT, MAX, MEN1, MET, MITF (c.952G>A, p.Glu318Lys variant only), MLH1, MSH2, MSH3, MSH6, MUTYH, NBN, NF1, NF2, NTHL1, PALB2, PDGFRA, PHOX2B, PMS2, POLD1, POLE, POT1, PRKAR1A, PTCH1, PTEN, RAD50, RAD51C, RAD51D, RB1, RECQL4, RET, RUNX1, SDHAF2, SDHA (sequence changes only), SDHB, SDHC, SDHD, SMAD4, SMARCA4, SMARCB1, SMARCE1, STK11, SUFU, TERC, TERT, TMEM127, TP53, TSC1, TSC2, VHL, WRN and WT1.  ? ?Results: Negative, no pathogenic variants identified in the genes analyzed.  The date of this test report is 08/16/2017.   ? ? ?  ?08/26/2017 - 03/24/2018 Chemotherapy  ? adjuvant TCH with Onpro q3weeks for 4 cycles ending on 11/04/17 followed herceptin maintenance therapy every 3 weeks starting 12/28/17 and comepleted after 10 cycles on 03/24/18 ?  ?08/31/2017 Breast US  ? On physical exam,there is a firm smooth mass in the low left axilla ?spanning at least 5 cm. ?  ?Ultrasound is performed, showing a complicated fluid collection ?which is predominantly anechoic measuring 5.2 x 3.9 x 3.5 cm. No ?blood flow was identified within the soft tissue component of the ?mass on color Doppler imaging. The appearance is most consistent ?with a postoperative seroma/resolving hematoma. ?  ?IMPRESSION: ?The complex fluid  collection in the left axilla is very likely to ?represent a postoperative seroma/ resolving hematoma. ?  ?RECOMMENDATION: ?Three-month follow-up left axillary ultrasound is recommended. ?  ? ?  ?03/13/2018 -   Anti-estrogen oral therapy  ? Anastrozole daily ? ?  ?05/11/2018 Procedure  ? PAC removal by Dr. Lynelle Doctor on 05/11/18 ? ?  ?05/18/2018 Survivorship  ? Survivorship Clinic with NP Jamelle Haring on 05/18/18 ? ?  ?7/25

## 2022-05-14 ENCOUNTER — Encounter: Payer: Self-pay | Admitting: Hematology

## 2022-05-24 ENCOUNTER — Telehealth: Payer: Self-pay | Admitting: *Deleted

## 2022-05-24 ENCOUNTER — Ambulatory Visit: Payer: Medicare HMO | Admitting: Podiatry

## 2022-05-24 DIAGNOSIS — M2042 Other hammer toe(s) (acquired), left foot: Secondary | ICD-10-CM | POA: Diagnosis not present

## 2022-05-24 NOTE — Progress Notes (Unsigned)
Subjective:   Patient ID: Kathleen Hanson, female   DOB: 86 y.o.   MRN: [id number]   HPI Chief Complaint  Patient presents with   Hammer Toe     R foot hammertoes, causing discomfort, struggles wearing shoes   Left foot is "shorter" than the right. It is the right 2nd toe that bothers her the bost. It feels better with cloth shoes and hurts with leather shoes.    ROS      Objective:  Physical Exam  ***rigid right 2nd ht, small callus Hallux malleus      Assessment:  ***     Plan:  ***

## 2022-05-24 NOTE — Telephone Encounter (Signed)
Patient is calling for more clarification on what type of shoe to purchase for her hammertoe, instructions on how to tape, what type of tape to keep it straight, what kind of cushions to use,was not discussed in office.Please advise.Marland Kitchen

## 2022-05-24 NOTE — Patient Instructions (Addendum)
Look at getting an EXTRA DEPTH or [place]  Monitor for any open lesions, skin breakdown. If anything were to occur, let me know.   If was nice to meet you today. If you have any questions or any further concerns, please feel fee to give me a call. You can call our office at [phone] or please feel fee to send me a message through [place].     Hammer Toe Hammer toe is a change in the shape, or a deformity, of the toe. The deformity causes the middle joint of the toe to stay bent. Hammer toe starts gradually. At first, the toe can be straightened. Then over time, the toe deformity becomes stiff, inflexible, and permanently bent. Hammer toe usually affects the second, third, or fourth toe. A hammer toe causes pain, especially when wearing shoes. Corns and calluses can result from the toe rubbing against the inside of the shoe. Early treatments to keep the toe straight may relieve pain. As the deformity of the toe becomes stiff and permanent, surgery may be needed to straighten the toe. What are the causes? This condition is caused by abnormal bending of the toe joint that is closest to your foot. Over time, the toe bending downward pulls on the muscles and connections (tendons) of the toe joint, making them weak and stiff. Wearing shoes that are too narrow in the toe box and do not allow toes to fully straighten can cause this condition. What increases the risk? You are more likely to develop this condition if you: Are an older female. Wear shoes that are too small, or wear high-heeled shoes that pinch your toes. Have a second toe that is longer than your big toe (first toe). Injure your foot or toe. Have arthritis, or have a nerve or muscle disorder. Have diabetes or a condition known as Charcot joint, which may cause you to walk abnormally. Have a family history of hammer toe. Are a Engineer, mining. What are the signs or symptoms? Pain and deformity of the toe are the main  symptoms of this condition. The pain is worse when wearing shoes, walking, or running. Other symptoms may include: A thickened patch of skin, called a corn or callus, that forms over the top of the bent part of the toe or between the toes. Redness and a burning feeling on the bent toe. An open sore that forms on the top of the bent toe. Not being able to straighten the affected toe. How is this diagnosed? This condition is diagnosed based on your symptoms and a physical exam. During the exam, your health care provider will try to straighten your toe to see how stiff the deformity is. You may also have tests, such as: A blood test to check for rheumatoid arthritis or diabetes. An X-ray to show how severe the toe deformity is. How is this treated? Treatment for this condition depends on whether the toe is flexible or deformed and no longer moveable. In less severe cases, a hammer toe can be straightened without surgery. These treatments include: Taping the toe into a straightened position. Using pads and cushions to protect the bent toe. Wearing shoes that provide enough room for the toes. Doing toe-stretching exercises at home. Taking an NSAID, such as ibuprofen, to reduce pain and swelling. Using special orthotics or insoles for pain relief and to improve walking. If these treatments do not help or the toe has a severe deformity and cannot be straightened, surgery is the next  option. The most common surgeries used to straighten a hammer toe include: Arthroplasty or osteotomy. Part of the toe joint is reconstructed or removed, which allows the toe to straighten. Fusion. Cartilage between the two bones of the joint is taken out, and the bones are fused together into one longer bone. Implantation. Part of the bone is removed and replaced with an implant to allow the toe to move again. Flexor tendon transfer. The tendons that curl the toes down (flexor tendons) are repositioned. Follow these  instructions at home: Take over-the-counter and prescription medicines only as told by your health care provider. Do toe-straightening and stretching exercises as told by your health care provider. Keep all follow-up visits. This is important. How is this prevented? Wear shoes that fit properly and give your toes enough room. Shoes should not cause pain. Buy shoes at the end of the day to make sure they fit well, since your foot may swell during the day. Make sure they are comfortable before you buy them. As you age, your shoe size might change, including the width. Measure both feet and buy shoes for the larger foot. A shoe repair store might be able to stretch shoes that feel tight in spots. Do not wear high-heeled shoes or shoes with pointed toes. Contact a health care provider if: Your pain gets worse. Your toe becomes red or swollen. You develop an open sore on your toe. Summary Hammer toe is a condition that gradually causes your toe to become bent and stiff. Hammer toe can be treated by taping the toe into a straightened position and doing toe-stretching exercises. If these treatments do not help, surgery may be needed. To prevent this condition, wear shoes that fit properly, give your toes enough room, and do not cause pain. This information is not intended to replace advice given to you by your health care provider. Make sure you discuss any questions you have with your health care provider. Document Revised: 01/17/2020 Document Reviewed: 01/17/2020 Elsevier Patient Education  [place].

## 2022-05-25 NOTE — Telephone Encounter (Signed)
Called , no answer,Left vmessage for patient of physicians instructions and recommendations and to call back for further questions

## 2022-06-02 ENCOUNTER — Ambulatory Visit: Payer: Medicare HMO | Admitting: Physician Assistant

## 2022-06-05 ENCOUNTER — Other Ambulatory Visit: Payer: Self-pay | Admitting: Nurse Practitioner

## 2022-06-10 ENCOUNTER — Telehealth: Payer: Self-pay

## 2022-06-10 ENCOUNTER — Other Ambulatory Visit: Payer: Self-pay

## 2022-06-10 NOTE — Telephone Encounter (Signed)
T/C from pt stating her pharmacy received 2 prescriptions for Fosamax.  Pt wants her PCP to continue prescribing her fosamax prescription.   Pt advised we can cancel her 8/14 refill.

## 2022-06-23 ENCOUNTER — Ambulatory Visit: Payer: Medicare HMO | Admitting: Physician Assistant

## 2022-07-07 ENCOUNTER — Ambulatory Visit
Admission: RE | Admit: 2022-07-07 | Discharge: 2022-07-07 | Disposition: A | Discharge status: Home / Self Care | Payer: Medicare HMO | Source: Ambulatory Visit | Attending: Hematology | Admitting: Hematology

## 2022-07-07 DIAGNOSIS — Z1231 Encounter for screening mammogram for malignant neoplasm of breast: Secondary | ICD-10-CM

## 2022-07-09 ENCOUNTER — Other Ambulatory Visit: Payer: Self-pay | Admitting: Hematology

## 2022-07-09 ENCOUNTER — Telehealth: Payer: Self-pay

## 2022-07-09 DIAGNOSIS — N6310 Unspecified lump in the right breast, unspecified quadrant: Secondary | ICD-10-CM

## 2022-07-09 NOTE — Telephone Encounter (Signed)
[facility] called stating the [facility] is requiring [facility] for Diagnostic Mammogram of her breast.  Alferd Apa, LPN faxed orders to the [facility] on 07/09/2022.

## 2022-07-20 ENCOUNTER — Ambulatory Visit
Admission: RE | Admit: 2022-07-20 | Discharge: 2022-07-20 | Disposition: A | Discharge status: Home / Self Care | Payer: Medicare HMO | Source: Ambulatory Visit | Attending: Hematology | Admitting: Hematology

## 2022-07-20 DIAGNOSIS — N6310 Unspecified lump in the right breast, unspecified quadrant: Secondary | ICD-10-CM

## 2022-07-29 ENCOUNTER — Encounter: Payer: Self-pay | Admitting: Hematology

## 2022-08-31 ENCOUNTER — Inpatient Hospital Stay: Admission: RE | Admit: 2022-08-31 | Payer: Medicare HMO | Source: Ambulatory Visit

## 2022-09-08 ENCOUNTER — Other Ambulatory Visit: Payer: Medicare HMO

## 2022-09-08 ENCOUNTER — Ambulatory Visit: Payer: Medicare HMO | Admitting: Nurse Practitioner

## 2022-09-27 ENCOUNTER — Other Ambulatory Visit: Payer: Self-pay

## 2022-09-27 ENCOUNTER — Inpatient Hospital Stay: Payer: Medicare HMO | Admitting: Hematology

## 2022-09-27 ENCOUNTER — Encounter: Payer: Self-pay | Admitting: Hematology

## 2022-09-27 ENCOUNTER — Inpatient Hospital Stay: Payer: Medicare HMO | Attending: Hematology

## 2022-09-27 VITALS — BP 152/60 | HR 65 | Temp 98.2°F | Wt 132.1 lb

## 2022-09-27 DIAGNOSIS — I890 Lymphedema, not elsewhere classified: Secondary | ICD-10-CM | POA: Insufficient documentation

## 2022-09-27 DIAGNOSIS — Z79899 Other long term (current) drug therapy: Secondary | ICD-10-CM | POA: Insufficient documentation

## 2022-09-27 DIAGNOSIS — M85852 Other specified disorders of bone density and structure, left thigh: Secondary | ICD-10-CM | POA: Diagnosis not present

## 2022-09-27 DIAGNOSIS — Z780 Asymptomatic menopausal state: Secondary | ICD-10-CM

## 2022-09-27 DIAGNOSIS — I10 Essential (primary) hypertension: Secondary | ICD-10-CM | POA: Insufficient documentation

## 2022-09-27 DIAGNOSIS — Z9012 Acquired absence of left breast and nipple: Secondary | ICD-10-CM | POA: Insufficient documentation

## 2022-09-27 DIAGNOSIS — Z79811 Long term (current) use of aromatase inhibitors: Secondary | ICD-10-CM | POA: Insufficient documentation

## 2022-09-27 DIAGNOSIS — Z1321 Encounter for screening for nutritional disorder: Secondary | ICD-10-CM

## 2022-09-27 DIAGNOSIS — C50412 Malignant neoplasm of upper-outer quadrant of left female breast: Secondary | ICD-10-CM

## 2022-09-27 DIAGNOSIS — Z170 Estrogen receptor positive status [ER+]: Secondary | ICD-10-CM | POA: Diagnosis not present

## 2022-09-27 DIAGNOSIS — M8580 Other specified disorders of bone density and structure, unspecified site: Secondary | ICD-10-CM | POA: Diagnosis not present

## 2022-09-27 LAB — CBC WITH DIFFERENTIAL (CANCER CENTER ONLY)
Abs Immature Granulocytes: 0.01 10*3/uL (ref 0.00–0.07)
Basophils Absolute: 0 10*3/uL (ref 0.0–0.1)
Basophils Relative: 0 %
Eosinophils Absolute: 0.1 10*3/uL (ref 0.0–0.5)
Eosinophils Relative: 3 %
HCT: 41.6 % (ref 36.0–46.0)
Hemoglobin: 14.3 g/dL (ref 12.0–15.0)
Immature Granulocytes: 0 %
Lymphocytes Relative: 18 %
Lymphs Abs: 0.9 10*3/uL (ref 0.7–4.0)
MCH: 31.2 pg (ref 26.0–34.0)
MCHC: 34.4 g/dL (ref 30.0–36.0)
MCV: 90.8 fL (ref 80.0–100.0)
Monocytes Absolute: 0.4 10*3/uL (ref 0.1–1.0)
Monocytes Relative: 8 %
Neutro Abs: 3.5 10*3/uL (ref 1.7–7.7)
Neutrophils Relative %: 71 %
Platelet Count: 217 10*3/uL (ref 150–400)
RBC: 4.58 MIL/uL (ref 3.87–5.11)
RDW: 13.1 % (ref 11.5–15.5)
WBC Count: 4.9 10*3/uL (ref 4.0–10.5)
nRBC: 0 % (ref 0.0–0.2)

## 2022-09-27 LAB — CMP (CANCER CENTER ONLY)
ALT: 14 U/L (ref 0–44)
AST: 13 U/L — ABNORMAL LOW (ref 15–41)
Albumin: 4.3 g/dL (ref 3.5–5.0)
Alkaline Phosphatase: 69 U/L (ref 38–126)
Anion gap: 8 (ref 5–15)
BUN: 16 mg/dL (ref 8–23)
CO2: 28 mmol/L (ref 22–32)
Calcium: 9.5 mg/dL (ref 8.9–10.3)
Chloride: 103 mmol/L (ref 98–111)
Creatinine: 0.47 mg/dL (ref 0.44–1.00)
GFR, Estimated: >60 mL/min (ref >60)
Glucose, Bld: 115 mg/dL — ABNORMAL HIGH (ref 70–99)
Potassium: 4 mmol/L (ref 3.5–5.1)
Sodium: 139 mmol/L (ref 135–145)
Total Bilirubin: 0.4 mg/dL (ref 0.3–1.2)
Total Protein: 6.9 g/dL (ref 6.5–8.1)

## 2022-09-27 LAB — VITAMIN D 25 HYDROXY (VIT D DEFICIENCY, FRACTURES): Vit D, 25-Hydroxy: 42.76 ng/mL (ref 30–100)

## 2022-09-27 NOTE — Progress Notes (Signed)
[place]   [phone]) [phone] [phone]) [phone]   Clinic Follow up Note   Patient Care Team: Bernerd Limbo, MD as PCP - General (Family Medicine) [name], MD as Consulting Physician (General Surgery) [name], MD as Consulting Physician (Hematology) [name], MD as Consulting Physician (Radiation Oncology) Gardenia Phlegm, NP as Nurse Practitioner (Hematology and Oncology) Warren Danes, PA-C as Physician Assistant (Dermatology)  Date of Service:  09/27/2022  CHIEF COMPLAINT: f/u of left breast cancer   CURRENT THERAPY:  -Adjuvant Anastrozole daily starting 03/13/18 -Fosamax 1m weekly starting in 11/2019   ASSESSMENT:  Kathleen COSTELLOis a 86y.o. female with   Malignant neoplasm of upper-outer quadrant of left breast in female, estrogen receptor positive ([place] invasive ductal carcinoma, pT2N0M0, Stage: 1A, triple positive, Grade 3 -History of invasive and in situ ductal carcinoma of left breast, 10/26/1999, Estrogen receptor negative, stage 1, Grade 3 -[place]s/p Lumpectomy (sentinel node negative, 2-3 removed), chemo, radiation -second breast cancer diagnosed in 06/2017; s/p left mastectomy, adjuvant chemo TCH and maintenance Herceptin.  -Given her prior radiation, additional treatment not recommended. -She declined oral Her2 antibody Neratinib due to concerns of side effects.  -She started anti-estrogen therapy with Anastrozole in 02/2018. She is tolerating well overall, will continue for 5-10 years.   Osteopenia after menopause -10/2019 DEXA shows Osteopenia recurrence with lowest T-score at -1.5 at left hip. She is scheduled for DEXA in 01/2023 -NP Lacie offered bisphosphonate with Zometa but she opted to restart Fosamax 742mweekly in 11/2019. She is tolerating well.      PLAN: - Lab reviewed  -Dexa schedule -continue Anastrozole, will make a decision if she will continue beyond  5 years at next visit, depends on her DEXA result  F/u in 46m25m  SUMMARY OF ONCOLOGIC HISTORY: Oncology History Overview Note  Cancer Staging Malignant neoplasm of upper-outer quadrant of left breast in female, estrogen receptor positive ([place] form: Breast, AJCC 8th Edition - Clinical stage from 07/06/2017: Stage IA (cT1c, cN0, cM0, G3, ER: Positive, PR: Positive, HER2: Positive) - Unsigned Staging comments: Staged at breast conference 9.12.18 - Pathologic stage from 07/13/2017: Stage IA (pT2, pN0, cM0, G3, ER: Positive, PR: Positive, HER2: Positive) - Signed by FenTruitt MerleD on 08/16/2017     Malignant neoplasm of upper-outer quadrant of left breast in female, estrogen receptor positive ([place]8/[place] Mammogram   Diagnostic mammogram 06/23/17 IMPRESSION: Indeterminate mass with irregular margins at the 2:30 position 4 cm from nipple measuring 1.4 x 0.8 x 1.2 cm in the upper-outer left breast.   06/28/2017 Initial Biopsy   Diagnosis 06/28/17 Breast, left, needle core biopsy, upper outer quadrant, 2:30 o'clock, 4cm from nipple - INVASIVE MAMMARY CARCINOMA WITH CALCIFICATIONS, SEE COMMENT.   06/28/2017 Receptors her2   Estrogen Receptor: 100%, POSITIVE, STRONG STAINING INTENSITY Progesterone Receptor: 20%, POSITIVE, STRONG STAINING INTENSITY Proliferation Marker Ki67: 25% HER2: Postive    06/30/2017 Initial Diagnosis   Malignant neoplasm of upper-outer quadrant of left breast in female, estrogen receptor positive ([place] 07/13/2017 Surgery   Left mastectomy with SLN biopsy performed by Dr TotMarlou Starks  07/13/2017 Pathology Results   Diagnosis  1. Breast, simple mastectomy, Left - INVASIVE AND IN SITU DUCTAL CARCINOMA, 4 CM, MSBR GRADE 3. - MARGINS NOT INVOLVED. - SEE ONCOLOGY TABLE. 2. Lymph node, sentinel, biopsy, Left #1 - ONE BENIGN LYMPH NODE (0/1). 3. Lymph node, biopsy, Left #2 - ONE BENIGN  LYMPH NODE (0/1).   07/18/2017 - 07/27/2017 Hospital Admission   Admit date:  07/18/17 Admission diagnosis: spontaneous PNX Additional comments: the patient was admitted with a pneumothorax several days after a port was placed although her post procedure cxr was normal. She had a chest tube placed but had a persistent air leak. The pleurivac was placed to 40 cm h2o suction for several days and the pneumothorax finally resolved.    08/15/2017 Imaging   CT A/P  IMPRESSION: 1. No CT findings to suggest metastatic disease involving the abdomen/pelvis. 2. Left chest wall fluid collection as discussed above. 3. Small right pleural effusion with minimal overlying atelectasis. 4. Cholelithiasis. 5. Small periumbilical abdominal wall hernia containing fat.     08/15/2017 Imaging   NM Whole Body Bone Scan FINDINGS: Uptake at the LEFT lateral aspects of the cervical and lower thoracic spine, typically degenerative.   Uptake at the shoulders, elbows, wrists, hips, and knees, typically degenerative.   Asymmetric uptake at the sternoclavicular joints RIGHT greater than LEFT which may also be degenerative.   No definite foci of abnormal osseous tracer accumulation are identified which are suspicious for osseous metastases.   Biconvex thoracolumbar scoliosis.   Expected urinary tract and soft tissue distribution of tracer.   IMPRESSION: Scattered likely degenerative type uptake as above.   No definite scintigraphic evidence of osseous metastatic disease.   08/16/2017 Genetic Testing   The patient had genetic testing due to a personal and family history of breast cancer.  The [employer] Multi-Cancer Panel was ordered. The Multi-Cancer Panel offered by [employer] includes sequencing and/or deletion duplication testing of the following 83 genes: ALK, APC, ATM, AXIN2,BAP1,  BARD1, BLM, BMPR1A, BRCA1, BRCA2, BRIP1, CASR, CDC73, CDH1, CDK4, CDKN1B, CDKN1C, CDKN2A (p14ARF), CDKN2A (p16INK4a), CEBPA, CHEK2, CTNNA1, DICER1, DIS3L2, EGFR (c.2369C>T, p.Thr790Met variant only), EPCAM  (Deletion/duplication testing only), FH, FLCN, GATA2, GPC3, GREM1 (Promoter region deletion/duplication testing only), HOXB13 (c.251G>A, p.Gly84Glu), HRAS, KIT, MAX, MEN1, MET, MITF (c.952G>A, p.Glu318Lys variant only), MLH1, MSH2, MSH3, MSH6, MUTYH, NBN, NF1, NF2, NTHL1, PALB2, PDGFRA, PHOX2B, PMS2, POLD1, POLE, POT1, PRKAR1A, PTCH1, PTEN, RAD50, RAD51C, RAD51D, RB1, RECQL4, RET, RUNX1, SDHAF2, SDHA (sequence changes only), SDHB, SDHC, SDHD, SMAD4, SMARCA4, SMARCB1, SMARCE1, STK11, SUFU, TERC, TERT, TMEM127, TP53, TSC1, TSC2, VHL, WRN and WT1.   Results: Negative, no pathogenic variants identified in the genes analyzed.  The date of this test report is 08/16/2017.      08/26/2017 - 03/24/2018 Chemotherapy   adjuvant TCH with Onpro q3weeks for 4 cycles ending on 11/04/17 followed herceptin maintenance therapy every 3 weeks starting 12/28/17 and comepleted after 10 cycles on 03/24/18   08/31/2017 Breast US   On physical exam,there is a firm smooth mass in the low left axilla spanning at least 5 cm.   Ultrasound is performed, showing a complicated fluid collection which is predominantly anechoic measuring 5.2 x 3.9 x 3.5 cm. No blood flow was identified within the soft tissue component of the mass on color Doppler imaging. The appearance is most consistent with a postoperative seroma/resolving hematoma.   IMPRESSION: The complex fluid collection in the left axilla is very likely to represent a postoperative seroma/ resolving hematoma.   RECOMMENDATION: Three-month follow-up left axillary ultrasound is recommended.     03/13/2018 -  Anti-estrogen oral therapy   Anastrozole daily   05/11/2018 Procedure   PAC removal by Dr. Lynelle Doctor on 05/11/18   05/18/2018 Survivorship   Survivorship Clinic with NP Jamelle Haring on 05/18/18   05/18/2018 Survivorship  Survivorship Clinic with NP Jamelle Haring on 05/18/18      INTERVAL HISTORY:  Kathleen Hanson is here for a follow up of  left breast cancer  She was last seen by me on 03/09/2022 She presents to the clinic alone. Pt states she is doing well.Pt denies having joint pain. Appetite is well.Pt is still driving. Pt reports joint pain while on Anastrozole.    All other systems were reviewed with the patient and are negative.  MEDICAL HISTORY:  Past Medical History:  Diagnosis Date   Atypical mole 06/13/2019   Left Inner Thigh (mild)   Cancer of left breast (HCC)    [place] level II melanoma ([place]) 11/15/2018   Left Scapula (excision)   Constipation    Family history of breast cancer    Fibrocystic breast    Hyperlipidemia    Nodular basal cell carcinoma (BCC) 06/13/2019   Right Mid Paraspinal (curet and 5FU)   Ocular migraine    "used to have them monthly for a period of time; seemed to stop; now have had a couple in the last week" (07/13/2017)   Superficial basal cell carcinoma (BCC) 06/13/2019   Left Upper Leg (curet and 5FU)    SURGICAL HISTORY: Past Surgical History:  Procedure Laterality Date   BREAST BIOPSY Right ~ 2001   BREAST EXCISIONAL BIOPSY Right    BREAST LUMPECTOMY Left 2001   chemo and radiation   CESAREAN SECTION  X 1   IR CATHETER TUBE CHANGE  07/20/2017   MASTECTOMY COMPLETE / SIMPLE W/ SENTINEL NODE BIOPSY Left 07/13/2017    LEFT MASTECTOMY WITH SENTINEL NODE MAPPING ERAS PATHWAY /notes 07/13/2017   MASTECTOMY W/ SENTINEL NODE BIOPSY Left 07/13/2017   Procedure: LEFT MASTECTOMY WITH SENTINEL NODE [place];  Surgeon: [name], MD;  Location: [place];  Service: General;  Laterality: Left;   PORT-A-CATH REMOVAL N/A 05/11/2018   Procedure: REMOVAL PORT-A-CATH;  Surgeon: Stark Klein, MD;  Location: [place];  Service: General;  Laterality: N/A;   PORTACATH PLACEMENT Right 07/13/2017   PORTACATH PLACEMENT Right 07/13/2017   Procedure: INSERTION PORT-A-CATH;  Surgeon: [name], MD;  Location: [place];  Service: General;  Laterality: Right;   TONSILLECTOMY      I have  reviewed the social history and family history with the patient and they are unchanged from previous note.  ALLERGIES:  has No Known Allergies.  MEDICATIONS:  Current Outpatient Medications  Medication Sig Dispense Refill   anastrozole (ARIMIDEX) 1 MG tablet TAKE 1 TABLET EVERY DAY 90 tablet 3   calcium citrate (CALCITRATE - DOSED IN MG ELEMENTAL CALCIUM) 950 MG tablet Take 200 mg of elemental calcium by mouth daily.     Cholecalciferol (VITAMIN D3) 2000 units TABS Take by mouth.     Multiple Vitamin (MULTIVITAMIN) capsule Take by mouth.     polyethylene glycol powder (GLYCOLAX/MIRALAX) 17 GM/SCOOP powder      simvastatin (ZOCOR) 40 MG tablet Take 1 tablet by mouth at bedtime.     vitamin C (ASCORBIC ACID) 500 MG tablet Take 500 mg by mouth daily.     No current facility-administered medications for this visit.    PHYSICAL EXAMINATION: ECOG PERFORMANCE STATUS: 0 - Asymptomatic  Vitals:   09/27/22 1432  BP: (!) 152/60  Pulse: 65  Temp: 98.2 F (36.8 C)  SpO2: 99%   Wt Readings from Last 3 Encounters:  09/27/22 132 lb 1.6 oz (59.9 kg)  03/09/22 130 lb 3.2 oz (  59.1 kg)  08/06/21 132 lb 1.6 oz (59.9 kg)     GENERAL:alert, no distress and comfortable SKIN: skin color, texture, turgor are normal, no rashes or significant lesions EYES: normal, Conjunctiva are pink and non-injected, sclera clear NECK: supple, thyroid normal size, non-tender, without nodularity LYMPH:  no palpable lymphadenopathy in the cervical, axillary  LUNGS: clear to auscultation and percussion with normal breathing effort HEART: regular rate & rhythm and no murmurs and no lower extremity edema ABDOMEN:abdomen soft, non-tender and normal bowel sounds Musculoskeletal:no cyanosis of digits and no clubbing  NEURO: alert & oriented x 3 with fluent speech, no focal motor/sensory deficits BREAST: No palpable mass, Mastectomy on the left side, no palpable nodule on chest wall, no adenopathy. Breast exam  benign   LABORATORY DATA:  I have reviewed the data as listed    Latest Ref Rng & Units 09/27/2022    2:21 PM 03/09/2022    8:32 AM 08/06/2021   12:34 PM  CBC  WBC 4.0 - 10.5 K/uL 4.9  4.1  4.3   Hemoglobin 12.0 - 15.0 g/dL 14.3  14.2  14.0   Hematocrit 36.0 - 46.0 % 41.6  42.2  41.1   Platelets 150 - 400 K/uL 217  199  212         Latest Ref Rng & Units 09/27/2022    2:21 PM 03/09/2022    8:32 AM 08/06/2021   12:34 PM  CMP  Glucose 70 - 99 mg/dL 115  100  147   BUN 8 - 23 mg/dL _0 Creatinine 0.44 - 1.00 mg/dL 0.47  0.58  0.48   Sodium 135 - 145 mmol/L 139  138  136   Potassium 3.5 - 5.1 mmol/L 4.0  3.7  3.8   Chloride 98 - 111 mmol/L 103  102  102   CO2 22 - 32 mmol/L _1 Calcium 8.9 - 10.3 mg/dL 9.5  9.3  9.0   Total Protein 6.5 - 8.1 g/dL 6.9  6.8  7.0   Total Bilirubin 0.3 - 1.2 mg/dL 0.4  0.6  0.6   Alkaline Phos 38 - 126 U/L 69  76  62   AST 15 - 41 U/L _2 ALT 0 - 44 U/L _3 RADIOGRAPHIC STUDIES: I have personally reviewed the radiological images as listed and agreed with the findings in the report. No results found.    No orders of the defined types were placed in this encounter.  All questions were answered. The patient knows to call the clinic with any problems, questions or concerns. No barriers to learning was detected. The total time spent in the appointment was 30 minutes.     [name], MD 09/27/2022   Felicity Coyer, CMA, am acting as scribe for [name], MD.   I have reviewed the above documentation for accuracy and completeness, and I agree with the above.

## 2022-09-27 NOTE — Assessment & Plan Note (Signed)
-  10/2019 DEXA shows Osteopenia recurrence with lowest T-score at -1.5 at left hip. She is scheduled for DEXA in 01/2023 -NP Lacie offered bisphosphonate with Zometa but she opted to restart Fosamax '70mg'$  weekly in 11/2019. She is tolerating well.

## 2022-09-27 NOTE — Assessment & Plan Note (Addendum)
invasive ductal carcinoma, pT2N0M0, Stage: 1A, triple positive, Grade 3 -History of invasive and in situ ductal carcinoma of left breast, 10/26/1999, Estrogen receptor negative, stage 1, Grade 3 -first breast cancer Treated in [place]  -s/p Lumpectomy (sentinel node negative, 2-3 removed), chemo, radiation -second breast cancer diagnosed in 06/2017; s/p left mastectomy, adjuvant chemo TCH and maintenance Herceptin.  -Given her prior radiation, additional treatment not recommended. -She declined oral Her2 antibody Neratinib due to concerns of side effects.  -She started anti-estrogen therapy with Anastrozole in 02/2018. She is tolerating well overall, will continue for 5-10 years.

## 2022-12-06 ENCOUNTER — Other Ambulatory Visit: Payer: Self-pay

## 2022-12-06 DIAGNOSIS — Z170 Estrogen receptor positive status [ER+]: Secondary | ICD-10-CM

## 2022-12-06 MED ORDER — ANASTROZOLE 1 MG PO TABS: 1.0000 mg | ORAL_TABLET | Freq: Every day | ORAL | 2 refills | Status: DC

## 2022-12-13 ENCOUNTER — Ambulatory Visit
Admission: RE | Admit: 2022-12-13 | Discharge: 2022-12-13 | Disposition: A | Discharge status: Home / Self Care | Payer: Medicare HMO | Source: Ambulatory Visit | Attending: Hematology | Admitting: Hematology

## 2022-12-13 DIAGNOSIS — E2839 Other primary ovarian failure: Secondary | ICD-10-CM

## 2022-12-21 ENCOUNTER — Encounter: Payer: Self-pay | Admitting: Hematology

## 2023-01-31 ENCOUNTER — Other Ambulatory Visit: Payer: Self-pay | Admitting: Hematology

## 2023-01-31 DIAGNOSIS — C50412 Malignant neoplasm of upper-outer quadrant of left female breast: Secondary | ICD-10-CM

## 2023-02-16 ENCOUNTER — Other Ambulatory Visit: Payer: Self-pay

## 2023-02-16 ENCOUNTER — Other Ambulatory Visit: Payer: Medicare HMO

## 2023-03-29 ENCOUNTER — Other Ambulatory Visit: Payer: Self-pay

## 2023-03-29 DIAGNOSIS — Z170 Estrogen receptor positive status [ER+]: Secondary | ICD-10-CM

## 2023-03-29 NOTE — Assessment & Plan Note (Signed)
-  10/2019 DEXA shows Osteopenia recurrence with lowest T-score at -1.5 at left hip. She is scheduled for DEXA in 01/2023 -NP Lacie offered bisphosphonate with Zometa but she opted to restart Fosamax 70mg weekly in 11/2019. She is tolerating well.  

## 2023-03-29 NOTE — Assessment & Plan Note (Signed)
invasive ductal carcinoma, pT2N0M0, Stage: 1A, triple positive, Grade 3 -History of invasive and in situ ductal carcinoma of left breast, 10/26/1999, Estrogen receptor negative, stage 1, Grade 3 -first breast cancer Treated in [place]  -s/p Lumpectomy (sentinel node negative, 2-3 removed), chemo, radiation -second breast cancer diagnosed in 06/2017; s/p left mastectomy, adjuvant chemo TCH and maintenance Herceptin.  -Given her prior radiation, additional treatment not recommended. -She declined oral Her2 antibody Neratinib due to concerns of side effects.  -She started anti-estrogen therapy with Anastrozole in 02/2018. She is tolerating well overall, will continue for 5-7years.

## 2023-03-30 ENCOUNTER — Inpatient Hospital Stay: Payer: Medicare HMO | Attending: Hematology

## 2023-03-30 ENCOUNTER — Encounter: Payer: Self-pay | Admitting: Hematology

## 2023-03-30 ENCOUNTER — Inpatient Hospital Stay (HOSPITAL_BASED_OUTPATIENT_CLINIC_OR_DEPARTMENT_OTHER): Payer: Medicare HMO | Admitting: Hematology

## 2023-03-30 VITALS — BP 176/64 | HR 61 | Temp 98.1°F | Resp 18 | Ht 64.0 in | Wt 128.7 lb

## 2023-03-30 DIAGNOSIS — C50412 Malignant neoplasm of upper-outer quadrant of left female breast: Secondary | ICD-10-CM | POA: Diagnosis present

## 2023-03-30 DIAGNOSIS — Z79899 Other long term (current) drug therapy: Secondary | ICD-10-CM | POA: Diagnosis not present

## 2023-03-30 DIAGNOSIS — Z9012 Acquired absence of left breast and nipple: Secondary | ICD-10-CM | POA: Diagnosis not present

## 2023-03-30 DIAGNOSIS — Z780 Asymptomatic menopausal state: Secondary | ICD-10-CM | POA: Diagnosis not present

## 2023-03-30 DIAGNOSIS — M8580 Other specified disorders of bone density and structure, unspecified site: Secondary | ICD-10-CM

## 2023-03-30 DIAGNOSIS — M85852 Other specified disorders of bone density and structure, left thigh: Secondary | ICD-10-CM | POA: Diagnosis not present

## 2023-03-30 DIAGNOSIS — Z170 Estrogen receptor positive status [ER+]: Secondary | ICD-10-CM

## 2023-03-30 DIAGNOSIS — Z79811 Long term (current) use of aromatase inhibitors: Secondary | ICD-10-CM | POA: Diagnosis not present

## 2023-03-30 LAB — CMP (CANCER CENTER ONLY)
ALT: 15 U/L (ref 0–44)
AST: 17 U/L (ref 15–41)
Albumin: 4.1 g/dL (ref 3.5–5.0)
Alkaline Phosphatase: 63 U/L (ref 38–126)
Anion gap: 5 (ref 5–15)
BUN: 15 mg/dL (ref 8–23)
CO2: 29 mmol/L (ref 22–32)
Calcium: 9 mg/dL (ref 8.9–10.3)
Chloride: 104 mmol/L (ref 98–111)
Creatinine: 0.45 mg/dL (ref 0.44–1.00)
GFR, Estimated: >60 mL/min
Glucose, Bld: 96 mg/dL (ref 70–99)
Potassium: 4.1 mmol/L (ref 3.5–5.1)
Sodium: 138 mmol/L (ref 135–145)
Total Bilirubin: 0.6 mg/dL (ref 0.3–1.2)
Total Protein: 6.6 g/dL (ref 6.5–8.1)

## 2023-03-30 LAB — CBC WITH DIFFERENTIAL (CANCER CENTER ONLY)
Abs Immature Granulocytes: 0.01 10*3/uL (ref 0.00–0.07)
Basophils Absolute: 0 10*3/uL (ref 0.0–0.1)
Basophils Relative: 1 %
Eosinophils Absolute: 0.1 10*3/uL (ref 0.0–0.5)
Eosinophils Relative: 2 %
HCT: 40.2 % (ref 36.0–46.0)
Hemoglobin: 13.9 g/dL (ref 12.0–15.0)
Immature Granulocytes: 0 %
Lymphocytes Relative: 18 %
Lymphs Abs: 0.9 10*3/uL (ref 0.7–4.0)
MCH: 31.4 pg (ref 26.0–34.0)
MCHC: 34.6 g/dL (ref 30.0–36.0)
MCV: 90.7 fL (ref 80.0–100.0)
Monocytes Absolute: 0.5 10*3/uL (ref 0.1–1.0)
Monocytes Relative: 9 %
Neutro Abs: 3.5 10*3/uL (ref 1.7–7.7)
Neutrophils Relative %: 70 %
Platelet Count: 211 10*3/uL (ref 150–400)
RBC: 4.43 MIL/uL (ref 3.87–5.11)
RDW: 13 % (ref 11.5–15.5)
WBC Count: 5 10*3/uL (ref 4.0–10.5)
nRBC: 0 % (ref 0.0–0.2)

## 2023-03-30 LAB — VITAMIN D 25 HYDROXY (VIT D DEFICIENCY, FRACTURES): Vit D, 25-Hydroxy: 44.86 ng/mL (ref 30–100)

## 2023-03-30 NOTE — Progress Notes (Signed)
[facility]   [phone] [phone]   Clinic Follow up Note   Patient Care Team: Tracey Harries, MD as PCP - General (Family Medicine) Griselda Miner, MD as Consulting Physician (General Surgery) Malachy Mood, MD as Consulting Physician (Hematology) Antony Blackbird, MD as Consulting Physician (Radiation Oncology) Loa Socks, NP as Nurse Practitioner (Hematology and Oncology) Glyn Ade, PA-C as Physician Assistant (Dermatology)  Date of Service:  03/30/2023  CHIEF COMPLAINT: f/u of  left breast cancer   CURRENT THERAPY:  -Adjuvant Anastrozole daily starting 03/13/18 -Fosamax 70mg  weekly starting in 11/2019   ASSESSMENT:  Kathleen Hanson is a 87 y.o. 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  -Given her prior radiation, additional treatment not recommended. -She declined oral Her2 antibody Neratinib due to concerns of side effects.  -She started anti-estrogen therapy with Anastrozole in 02/2018. She is tolerating well overall, due to her significant osteoporosis, will stop anastrozole now to complete a 5 years therapy. -Will continue breast cancer surveillance.  Osteoporosis -10/2019 DEXA shows Osteopenia recurrence with lowest T-score at -1.5 at left hip. She is scheduled for DEXA in 01/2023 -NP Lacie offered bisphosphonate with Zometa but she opted to restart Fosamax 70mg  weekly in 11/2019. She is tolerating well.  -I reviewed her recent bone  density scan from February 2024, which showed T-score -3.7 at left forearm. -Due to her significant osteoporosis, I will stop her AI now. -Continue calcium and vitamin D supplement, and weightbearing exercise.       PLAN: -CMP -pending -lab reviewed -Discontinue Anastrozle due to significant low bone density, and she has reached the 5 year mark. -I order Mammogram -9/30 -lab and f/u in 1 year   SUMMARY OF ONCOLOGIC HISTORY: Oncology History Overview Note  Cancer Staging Malignant neoplasm of upper-outer quadrant of left breast in female, estrogen receptor positive (HCC) Staging form: Breast, AJCC 8th Edition - Clinical stage from 07/06/2017: Stage IA (cT1c, cN0, cM0, G3, ER: Positive, PR: Positive, HER2: Positive) - Unsigned Staging comments: Staged at breast conference 9.12.18 - Pathologic stage from 07/13/2017: Stage IA (pT2, pN0, cM0, G3, ER: Positive, PR: Positive, HER2: Positive) - Signed by Malachy Mood, MD on 08/16/2017     Malignant neoplasm of upper-outer quadrant of left breast in female, estrogen receptor positive (HCC)  06/23/2017 Mammogram   Diagnostic mammogram 06/23/17 IMPRESSION: Indeterminate mass with irregular margins at the 2:30 position 4 cm from nipple measuring 1.4 x 0.8 x 1.2 cm in the upper-outer left breast.   06/28/2017 Initial Biopsy   Diagnosis 06/28/17 Breast, left, needle core biopsy, upper outer quadrant, 2:30 o'clock, 4cm from nipple - INVASIVE MAMMARY CARCINOMA WITH CALCIFICATIONS, SEE COMMENT.   06/28/2017 Receptors her2   Estrogen Receptor: 100%, POSITIVE, STRONG STAINING INTENSITY Progesterone Receptor: 20%, POSITIVE, STRONG STAINING INTENSITY Proliferation Marker Ki67: 25% HER2: Postive    06/30/2017 Initial Diagnosis   Malignant neoplasm of upper-outer quadrant of left breast in female, estrogen receptor positive (HCC)   07/13/2017 Surgery   Left mastectomy with SLN biopsy performed by Dr Carolynne Edouard.    07/13/2017 Pathology  Results   Diagnosis  1.  Breast, simple mastectomy, Left - INVASIVE AND IN SITU DUCTAL CARCINOMA, 4 CM, MSBR GRADE 3. - MARGINS NOT INVOLVED. - SEE ONCOLOGY TABLE. 2. Lymph node, sentinel, biopsy, Left #1 - ONE BENIGN LYMPH NODE (0/1). 3. Lymph node, biopsy, Left #2 - ONE BENIGN LYMPH NODE (0/1).   07/18/2017 - 07/27/2017 Hospital Admission   Admit date: 07/18/17 Admission diagnosis: spontaneous PNX Additional comments: the patient was admitted with a pneumothorax several days after a port was placed although her post procedure cxr was normal. She had a chest tube placed but had a persistent air leak. The pleurivac was placed to 40 cm h2o suction for several days and the pneumothorax finally resolved.    08/15/2017 Imaging   CT A/P  IMPRESSION: 1. No CT findings to suggest metastatic disease involving the abdomen/pelvis. 2. Left chest wall fluid collection as discussed above. 3. Small right pleural effusion with minimal overlying atelectasis. 4. Cholelithiasis. 5. Small periumbilical abdominal wall hernia containing fat.     08/15/2017 Imaging   NM Whole Body Bone Scan FINDINGS: Uptake at the LEFT lateral aspects of the cervical and lower thoracic spine, typically degenerative.   Uptake at the shoulders, elbows, wrists, hips, and knees, typically degenerative.   Asymmetric uptake at the sternoclavicular joints RIGHT greater than LEFT which may also be degenerative.   No definite foci of abnormal osseous tracer accumulation are identified which are suspicious for osseous metastases.   Biconvex thoracolumbar scoliosis.   Expected urinary tract and soft tissue distribution of tracer.   IMPRESSION: Scattered likely degenerative type uptake as above.   No definite scintigraphic evidence of osseous metastatic disease.   08/16/2017 Genetic Testing   The patient had genetic testing due to a personal and family history of breast cancer.  The [employer] Multi-Cancer Panel was ordered. The Multi-Cancer  Panel offered by [employer] includes sequencing and/or deletion duplication testing of the following 83 genes: ALK, APC, ATM, AXIN2,BAP1,  BARD1, BLM, BMPR1A, BRCA1, BRCA2, BRIP1, CASR, CDC73, CDH1, CDK4, CDKN1B, CDKN1C, CDKN2A (p14ARF), CDKN2A (p16INK4a), CEBPA, CHEK2, CTNNA1, DICER1, DIS3L2, EGFR (c.2369C>T, p.Thr790Met variant only), EPCAM (Deletion/duplication testing only), FH, FLCN, GATA2, GPC3, GREM1 (Promoter region deletion/duplication testing only), HOXB13 (c.251G>A, p.Gly84Glu), HRAS, KIT, MAX, MEN1, MET, MITF (c.952G>A, p.Glu318Lys variant only), MLH1, MSH2, MSH3, MSH6, MUTYH, NBN, NF1, NF2, NTHL1, PALB2, PDGFRA, PHOX2B, PMS2, POLD1, POLE, POT1, PRKAR1A, PTCH1, PTEN, RAD50, RAD51C, RAD51D, RB1, RECQL4, RET, RUNX1, SDHAF2, SDHA (sequence changes only), SDHB, SDHC, SDHD, SMAD4, SMARCA4, SMARCB1, SMARCE1, STK11, SUFU, TERC, TERT, TMEM127, TP53, TSC1, TSC2, VHL, WRN and WT1.   Results: Negative, no pathogenic variants identified in the genes analyzed.  The date of this test report is 08/16/2017.      08/26/2017 - 03/24/2018 Chemotherapy   adjuvant TCH with Onpro q3weeks for 4 cycles ending on 11/04/17 followed herceptin maintenance therapy every 3 weeks starting 12/28/17 and comepleted after 10 cycles on 03/24/18   08/31/2017 Breast US   On physical exam,there is a firm smooth mass in the low left axilla spanning at least 5 cm.   Ultrasound is performed, showing a complicated fluid collection which is predominantly anechoic measuring 5.2 x 3.9 x 3.5 cm. No blood flow was identified within the soft tissue component of the mass on color Doppler imaging. The appearance is most consistent with a postoperative seroma/resolving hematoma.   IMPRESSION: The complex fluid collection in the left axilla is very likely to represent a postoperative seroma/ resolving hematoma.   RECOMMENDATION: Three-month follow-up  left axillary ultrasound is recommended.     03/13/2018 -  Anti-estrogen oral therapy    Anastrozole daily   05/11/2018 Procedure   PAC removal by Dr. Sung Amabile on 05/11/18   05/18/2018 Survivorship   Survivorship Clinic with NP Randall An on 05/18/18   05/18/2018 Survivorship   Survivorship Clinic with NP Randall An on 05/18/18      INTERVAL HISTORY:  Kathleen Hanson is here for a follow up of  left breast cancer. She was last seen by me on 09/27/2022. She presents to the clinic alone. Pt reports of doing well. She had just moved and downside her home to an apartment. She states that she is a little stress from the move. Pt state that she is having right shoulder pain when she goes to reach for things. Pt state she is still taking the Anastrozole.  All other systems were reviewed with the patient and are negative.  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   Left Scapula (excision)   Constipation    Family history of breast cancer    Fibrocystic breast    Hyperlipidemia    Nodular basal cell carcinoma (BCC) 06/13/2019   Right Mid Paraspinal (curet and 5FU)   Ocular migraine    "used to have them monthly for a period of time; seemed to stop; now have had a couple in the last week" (07/13/2017)   Superficial basal cell carcinoma (BCC) 06/13/2019   Left Upper Leg (curet and 5FU)    SURGICAL HISTORY: Past Surgical History:  Procedure Laterality Date   BREAST BIOPSY Right ~ 2001   BREAST EXCISIONAL BIOPSY Right    BREAST LUMPECTOMY Left 2001   chemo and radiation   CESAREAN SECTION  X 1   IR CATHETER TUBE CHANGE  07/20/2017   MASTECTOMY COMPLETE / SIMPLE W/ SENTINEL NODE BIOPSY Left 07/13/2017    LEFT MASTECTOMY WITH SENTINEL NODE MAPPING ERAS PATHWAY /notes 07/13/2017   MASTECTOMY W/ SENTINEL NODE BIOPSY Left 07/13/2017   Procedure: LEFT MASTECTOMY WITH SENTINEL NODE MAPPING ERAS PATHWAY;  Surgeon: Griselda Miner, MD;   Location: [facility] OR;  Service: General;  Laterality: Left;   PORT-A-CATH REMOVAL N/A 05/11/2018   Procedure: REMOVAL PORT-A-CATH;  Surgeon: Almond Lint, MD;  Location: [facility];  Service: General;  Laterality: N/A;   PORTACATH PLACEMENT Right 07/13/2017   PORTACATH PLACEMENT Right 07/13/2017   Procedure: INSERTION PORT-A-CATH;  Surgeon: Griselda Miner, MD;  Location: [facility] OR;  Service: General;  Laterality: Right;   TONSILLECTOMY      I have reviewed the social history and family history with the patient and they are unchanged from previous note.  ALLERGIES:  has No Known Allergies.  MEDICATIONS:  Current Outpatient Medications  Medication Sig Dispense Refill   calcium citrate (CALCITRATE - DOSED IN MG ELEMENTAL CALCIUM) 950 MG tablet Take 200 mg of elemental calcium by mouth daily.     Cholecalciferol (VITAMIN D3) 2000 units TABS Take by mouth.     Multiple Vitamin (MULTIVITAMIN) capsule Take by mouth.     polyethylene glycol powder (GLYCOLAX/MIRALAX) 17 GM/SCOOP powder      simvastatin (ZOCOR) 40 MG tablet Take 1 tablet by mouth at bedtime.     vitamin C (ASCORBIC ACID) 500 MG tablet Take 500 mg by mouth daily.  No current facility-administered medications for this visit.    PHYSICAL EXAMINATION: ECOG PERFORMANCE STATUS: 0 - Asymptomatic  Vitals:   03/30/23 1425  BP: (!) 176/64  Pulse: 61  Resp: 18  Temp: 98.1 F (36.7 C)  SpO2: 100%   Wt Readings from Last 3 Encounters:  03/30/23 128 lb 11.2 oz (58.4 kg)  09/27/22 132 lb 1.6 oz (59.9 kg)  03/09/22 130 lb 3.2 oz (59.1 kg)      GENERAL:alert, no distress and comfortable SKIN: skin color, texture, turgor are normal, no rashes or significant lesions EYES: normal, Conjunctiva are pink and non-injected, sclera clear NECK: (-) supple, thyroid normal size, non-tender, without nodularity LYMPH:  (-) no palpable lymphadenopathy in the cervical, axillary  ABDOMEN:(-) abdomen soft, (-)non-tender and (-) normal  bowel sounds BREAST: RT breast no palpable mass, breast exam benign . LT breast mastectomy no palpable mass , breast exam benign. LABORATORY DATA:  I have reviewed the data as listed    Latest Ref Rng & Units 03/30/2023    1:47 PM 09/27/2022    2:21 PM 03/09/2022    8:32 AM  CBC  WBC 4.0 - 10.5 K/uL 5.0  4.9  4.1   Hemoglobin 12.0 - 15.0 g/dL 40.9  81.1  91.4   Hematocrit 36.0 - 46.0 % 40.2  41.6  42.2   Platelets 150 - 400 K/uL 211  217  199         Latest Ref Rng & Units 03/30/2023    1:47 PM 09/27/2022    2:21 PM 03/09/2022    8:32 AM  CMP  Glucose 70 - 99 mg/dL 96  782  956   BUN 8 - 23 mg/dL 15  16  15    Creatinine 0.44 - 1.00 mg/dL 2.13  0.86  5.78   Sodium 135 - 145 mmol/L 138  139  138   Potassium 3.5 - 5.1 mmol/L 4.1  4.0  3.7   Chloride 98 - 111 mmol/L 104  103  102   CO2 22 - 32 mmol/L 29  28  29    Calcium 8.9 - 10.3 mg/dL 9.0  9.5  9.3   Total Protein 6.5 - 8.1 g/dL 6.6  6.9  6.8   Total Bilirubin 0.3 - 1.2 mg/dL 0.6  0.4  0.6   Alkaline Phos 38 - 126 U/L 63  69  76   AST 15 - 41 U/L 17  13  15    ALT 0 - 44 U/L 15  14  15        RADIOGRAPHIC STUDIES: I have personally reviewed the radiological images as listed and agreed with the findings in the report. No results found.    Orders Placed This Encounter  Procedures   MM 3D SCREENING MAMMOGRAM UNILATERAL RIGHT BREAST    Standing Status:   Future    Standing Expiration Date:   03/29/2024    Order Specific Question:   Reason for Exam (SYMPTOM  OR DIAGNOSIS REQUIRED)    Answer:   routine screening    Order Specific Question:   Preferred imaging location?    Answer:   [facility]    Order Specific Question:   Release to patient    Answer:   Immediate   All questions were answered. The patient knows to call the clinic with any problems, questions or concerns. No barriers to learning was detected. The total time spent in the appointment was 25 minutes.     Malachy Mood, MD 03/30/2023  Carolin Coy, CMA,  am acting as scribe for Malachy Mood, MD.   I have reviewed the above documentation for accuracy and completeness, and I agree with the above.

## 2023-04-08 ENCOUNTER — Other Ambulatory Visit: Payer: Self-pay

## 2023-04-08 ENCOUNTER — Telehealth: Payer: Self-pay

## 2023-04-08 NOTE — Telephone Encounter (Signed)
Pt called stating she still have some Anastrozole pills leftover (50 pills) and wanted to know if Dr. Mosetta Putt wanted the pt to finish taking the remainder of the pills before stopping.  Read over Dr. Latanya Maudlin office note and informed pt that Dr. Mosetta Putt wanted the pt to stop taking the pill d/t the impact the pills were having on the pt's bones.  Instructed pt to take the remaining pills to any local pharmacy that has a medication disposal bend to dispose of the remaining medication.  Pt verbalized understanding and stated she will take them to her [facility] for disposal.

## 2023-07-22 ENCOUNTER — Ambulatory Visit: Payer: Medicare HMO

## 2023-07-22 ENCOUNTER — Ambulatory Visit
Admission: RE | Admit: 2023-07-22 | Discharge: 2023-07-22 | Disposition: A | Discharge status: Home / Self Care | Payer: Medicare HMO | Source: Ambulatory Visit | Attending: Hematology

## 2023-07-22 DIAGNOSIS — C50412 Malignant neoplasm of upper-outer quadrant of left female breast: Secondary | ICD-10-CM

## 2024-01-24 ENCOUNTER — Ambulatory Visit: Admitting: Podiatry

## 2024-01-24 ENCOUNTER — Encounter: Payer: Self-pay | Admitting: Podiatry

## 2024-01-24 DIAGNOSIS — M79674 Pain in right toe(s): Secondary | ICD-10-CM | POA: Diagnosis not present

## 2024-01-24 DIAGNOSIS — M79675 Pain in left toe(s): Secondary | ICD-10-CM

## 2024-01-24 DIAGNOSIS — B351 Tinea unguium: Secondary | ICD-10-CM

## 2024-01-24 DIAGNOSIS — M2041 Other hammer toe(s) (acquired), right foot: Secondary | ICD-10-CM

## 2024-03-30 ENCOUNTER — Other Ambulatory Visit: Payer: Self-pay

## 2024-03-30 DIAGNOSIS — Z170 Estrogen receptor positive status [ER+]: Secondary | ICD-10-CM

## 2024-03-30 DIAGNOSIS — Z780 Asymptomatic menopausal state: Secondary | ICD-10-CM

## 2024-04-01 NOTE — Progress Notes (Unsigned)
 [facility]   Telephone:([phone] Fax:([phone]    Patient Care Team: Alfredia Ina, MD as PCP - General (Family Medicine) Caralyn Chandler, MD as Consulting Physician (General Surgery) Sonja Dogtown, MD as Consulting Physician (Hematology) Retta Caster, MD as Consulting Physician (Radiation Oncology) Percival Brace, NP as Nurse Practitioner (Hematology and Oncology) Dorthey Gave, PA-C as Physician Assistant (Dermatology)   CHIEF COMPLAINT: Follow up left breast cancer   Oncology History Overview Note  Cancer Staging Malignant neoplasm of upper-outer quadrant of left breast in female, estrogen receptor positive [facility]) Staging form: Breast, AJCC 8th Edition - Clinical stage from 07/06/2017: Stage IA (cT1c, cN0, cM0, G3, ER: Positive, PR: Positive, HER2: Positive) - Unsigned Staging comments: Staged at breast conference 9.12.18 - Pathologic stage from 07/13/2017: Stage IA (pT2, pN0, cM0, G3, ER: Positive, PR: Positive, HER2: Positive) - Signed by Sonja , MD on 08/16/2017     Malignant neoplasm of upper-outer quadrant of left breast in female, estrogen receptor positive (HCC)  06/23/2017 Mammogram   Diagnostic mammogram 06/23/17 IMPRESSION: Indeterminate mass with irregular margins at the 2:30 position 4 cm from nipple measuring 1.4 x 0.8 x 1.2 cm in the upper-outer left breast.   06/28/2017 Initial Biopsy   Diagnosis 06/28/17 Breast, left, needle core biopsy, upper outer quadrant, 2:30 o'clock, 4cm from nipple - INVASIVE MAMMARY CARCINOMA WITH CALCIFICATIONS, SEE COMMENT.   06/28/2017 Receptors her2   Estrogen Receptor: 100%, POSITIVE, STRONG STAINING INTENSITY Progesterone Receptor: 20%, POSITIVE, STRONG STAINING INTENSITY Proliferation Marker Ki67: 25% HER2: Postive    06/30/2017 Initial Diagnosis   Malignant neoplasm of upper-outer quadrant of left breast in female, estrogen receptor positive (HCC)   07/13/2017 Surgery   Left mastectomy  with SLN biopsy performed by Dr Alethea Andes.    07/13/2017 Pathology Results   Diagnosis  1. Breast, simple mastectomy, Left - INVASIVE AND IN SITU DUCTAL CARCINOMA, 4 CM, MSBR GRADE 3. - MARGINS NOT INVOLVED. - SEE ONCOLOGY TABLE. 2. Lymph node, sentinel, biopsy, Left #1 - ONE BENIGN LYMPH NODE (0/1). 3. Lymph node, biopsy, Left #2 - ONE BENIGN LYMPH NODE (0/1).   07/18/2017 - 07/27/2017 Hospital Admission   Admit date: 07/18/17 Admission diagnosis: spontaneous PNX Additional comments: the patient was admitted with a pneumothorax several days after a port was placed although her post procedure cxr was normal. She had a chest tube placed but had a persistent air leak. The pleurivac was placed to 40 cm h2o suction for several days and the pneumothorax finally resolved.    08/15/2017 Imaging   CT A/P  IMPRESSION: 1. No CT findings to suggest metastatic disease involving the abdomen/pelvis. 2. Left chest wall fluid collection as discussed above. 3. Small right pleural effusion with minimal overlying atelectasis. 4. Cholelithiasis. 5. Small periumbilical abdominal wall hernia containing fat.     08/15/2017 Imaging   NM Whole Body Bone Scan FINDINGS: Uptake at the LEFT lateral aspects of the cervical and lower thoracic spine, typically degenerative.   Uptake at the shoulders, elbows, wrists, hips, and knees, typically degenerative.   Asymmetric uptake at the sternoclavicular joints RIGHT greater than LEFT which may also be degenerative.   No definite foci of abnormal osseous tracer accumulation are identified which are suspicious for osseous metastases.   Biconvex thoracolumbar scoliosis.   Expected urinary tract and soft tissue distribution of tracer.   IMPRESSION: Scattered likely degenerative type uptake as above.   No definite scintigraphic evidence of osseous metastatic disease.  08/16/2017 Genetic Testing   The patient had genetic testing due to a personal and family  history of breast cancer.  The [employer] Multi-Cancer Panel was ordered. The Multi-Cancer Panel offered by [employer] includes sequencing and/or deletion duplication testing of the following 83 genes: ALK, APC, ATM, AXIN2,BAP1,  BARD1, BLM, BMPR1A, BRCA1, BRCA2, BRIP1, CASR, CDC73, CDH1, CDK4, CDKN1B, CDKN1C, CDKN2A (p14ARF), CDKN2A (p16INK4a), CEBPA, CHEK2, CTNNA1, DICER1, DIS3L2, EGFR (c.2369C>T, p.Thr790Met variant only), EPCAM (Deletion/duplication testing only), FH, FLCN, GATA2, GPC3, GREM1 (Promoter region deletion/duplication testing only), HOXB13 (c.251G>A, p.Gly84Glu), HRAS, KIT, MAX, MEN1, MET, MITF (c.952G>A, p.Glu318Lys variant only), MLH1, MSH2, MSH3, MSH6, MUTYH, NBN, NF1, NF2, NTHL1, PALB2, PDGFRA, PHOX2B, PMS2, POLD1, POLE, POT1, PRKAR1A, PTCH1, PTEN, RAD50, RAD51C, RAD51D, RB1, RECQL4, RET, RUNX1, SDHAF2, SDHA (sequence changes only), SDHB, SDHC, SDHD, SMAD4, SMARCA4, SMARCB1, SMARCE1, STK11, SUFU, TERC, TERT, TMEM127, TP53, TSC1, TSC2, VHL, WRN and WT1.   Results: Negative, no pathogenic variants identified in the genes analyzed.  The date of this test report is 08/16/2017.      08/26/2017 - 03/24/2018 Chemotherapy   adjuvant TCH with Onpro q3weeks for 4 cycles ending on 11/04/17 followed herceptin  maintenance therapy every 3 weeks starting 12/28/17 and comepleted after 10 cycles on 03/24/18   08/31/2017 Breast US    On physical exam,there is a firm smooth mass in the low left axilla spanning at least 5 cm.   Ultrasound is performed, showing a complicated fluid collection which is predominantly anechoic measuring 5.2 x 3.9 x 3.5 cm. No blood flow was identified within the soft tissue component of the mass on color Doppler imaging. The appearance is most consistent with a postoperative seroma/resolving hematoma.   IMPRESSION: The complex fluid collection in the left axilla is very likely to represent a postoperative seroma/ resolving hematoma.   RECOMMENDATION: Three-month follow-up left  axillary ultrasound is recommended.     03/13/2018 -  Anti-estrogen oral therapy   Anastrozole  daily   05/11/2018 Procedure   PAC removal by Dr. Sheppard Dickinson on 05/11/18   05/18/2018 Survivorship   Survivorship Clinic with NP Maralee Senate on 05/18/18   05/18/2018 Survivorship   Survivorship Clinic with NP Maralee Senate on 05/18/18      CURRENT THERAPY: Adjuvant Anastrozole  02/2018 - 03/2023  INTERVAL HISTORY Kathleen Hanson returns for follow up as scheduled. Last seen by Dr. Maryalice Smaller 03/30/23 and stopped AI. Doing well overall, remains active. Considering moving up to [place]  near her children, but no plans yet. Remains active and walks. Denies concerns in the right breast or L chest wall. Denies new/worse bone pain, unintentional weight loss, or other concerns.   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   Left Scapula (excision)   Constipation    Family history of breast cancer    Fibrocystic breast    Hyperlipidemia    Nodular basal cell carcinoma (BCC) 06/13/2019   Right Mid Paraspinal (curet and 5FU)   Ocular migraine    "used to have them monthly for a period of time; seemed to stop; now have had a couple in the last week" (07/13/2017)   Superficial basal cell carcinoma (BCC) 06/13/2019   Left Upper Leg (curet and 5FU)     Past Surgical History:  Procedure Laterality Date   BREAST BIOPSY Right ~ 2001   BREAST EXCISIONAL BIOPSY  Right    BREAST LUMPECTOMY Left 2001   chemo and radiation   CESAREAN SECTION  X 1   IR CATHETER TUBE CHANGE  07/20/2017   MASTECTOMY COMPLETE / SIMPLE W/ SENTINEL NODE BIOPSY Left 07/13/2017    LEFT MASTECTOMY WITH SENTINEL NODE MAPPING ERAS PATHWAY /notes 07/13/2017   MASTECTOMY W/ SENTINEL NODE BIOPSY Left 07/13/2017   Procedure: LEFT MASTECTOMY WITH SENTINEL NODE MAPPING ERAS  PATHWAY;  Surgeon: Caralyn Chandler, MD;  Location: [facility] OR;  Service: General;  Laterality: Left;   PORT-A-CATH REMOVAL N/A 05/11/2018   Procedure: REMOVAL PORT-A-CATH;  Surgeon: Lockie Rima, MD;  Location: [facility];  Service: General;  Laterality: N/A;   PORTACATH PLACEMENT Right 07/13/2017   PORTACATH PLACEMENT Right 07/13/2017   Procedure: INSERTION PORT-A-CATH;  Surgeon: Caralyn Chandler, MD;  Location: [facility] OR;  Service: General;  Laterality: Right;   TONSILLECTOMY       Outpatient Encounter Medications as of 04/02/2024  Medication Sig   calcium citrate (CALCITRATE - DOSED IN MG ELEMENTAL CALCIUM) 950 MG tablet Take 200 mg of elemental calcium by mouth daily.   Cholecalciferol (VITAMIN D3) 2000 units TABS Take by mouth.   Multiple Vitamin (MULTIVITAMIN) capsule Take by mouth.   polyethylene glycol powder (GLYCOLAX /MIRALAX ) 17 GM/SCOOP powder    simvastatin  (ZOCOR ) 40 MG tablet Take 1 tablet by mouth at bedtime.   vitamin C  (ASCORBIC ACID ) 500 MG tablet Take 500 mg by mouth daily.   No facility-administered encounter medications on file as of 04/02/2024.     Today's Vitals   04/02/24 1141 04/02/24 1151  BP: 138/82   Pulse: 60   Resp: 17   Temp: 97.6 F (36.4 C)   SpO2: 97%   Weight: 135 lb 12.8 oz (61.6 kg)   PainSc:  0-No pain   Body mass index is 23.31 kg/m.   ECOG PERFORMANCE STATUS: 0 - Asymptomatic  PHYSICAL EXAM GENERAL:alert, no distress and comfortable SKIN: no rash  EYES: sclera clear NECK: without mass LYMPH:  no palpable cervical or supraclavicular lymphadenopathy  LUNGS: clear with normal breathing effort HEART: regular rate & rhythm, no lower extremity edema ABDOMEN: abdomen soft, non-tender and normal bowel sounds NEURO: alert & oriented x 3 with fluent speech, no focal motor/sensory deficits Breast exam: S/p left mastectomy, incisions completely healed.  Right nipple without discharge or inversion.  No palpable mass in the right breast, left  chest wall, or either axilla that I could appreciate   CBC    Latest Ref Rng & Units 04/02/2024   11:10 AM 03/30/2023    1:47 PM 09/27/2022    2:21 PM  CBC  WBC 4.0 - 10.5 K/uL 5.2  5.0  4.9   Hemoglobin 12.0 - 15.0 g/dL 62.1  30.8  65.7   Hematocrit 36.0 - 46.0 % 40.5  40.2  41.6   Platelets 150 - 400 K/uL 229  211  217       CMP     Latest Ref Rng & Units 04/02/2024   11:10 AM 03/30/2023    1:47 PM 09/27/2022    2:21 PM  CMP  Glucose 70 - 99 mg/dL 96  96  846   BUN 8 - 23 mg/dL 14  15  16    Creatinine 0.44 - 1.00 mg/dL 9.62  9.52  8.41   Sodium 135 - 145 mmol/L 137  138  139   Potassium 3.5 - 5.1 mmol/L 4.6  4.1  4.0   Chloride 98 -  111 mmol/L 103  104  103   CO2 22 - 32 mmol/L 28  29  28    Calcium 8.9 - 10.3 mg/dL 9.1  9.0  9.5   Total Protein 6.5 - 8.1 g/dL 6.8  6.6  6.9   Total Bilirubin 0.0 - 1.2 mg/dL 0.5  0.6  0.4   Alkaline Phos 38 - 126 U/L 71  63  69   AST 15 - 41 U/L 17  17  13    ALT 0 - 44 U/L 16  15  14        ASSESSMENT & PLAN:Kathleen Hanson is a 88 y.o. 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  -Given her prior radiation, additional treatment not recommended. -She declined oral Her2 antibody Neratinib due to concerns of side effects.  -She completed anti-estrogen therapy with Anastrozole  02/2018 - 03/2023 -Kathleen Hanson is clinically doing well, exam is benign, labs are unremarkable.  Overall no clinical concern for recurrence -Screening right mammo due 06/2024,  ordered today.  Continue surveillance   Osteoporosis -10/2019 DEXA shows Osteopenia recurrence with lowest T-score at -1.5 at left hip. -I previously offered bisphosphonate with Zometa but she opted to restart Fosamax  70mg  weekly in 11/2019. She is tolerating well.  -DEXA February 2024 showed T-score -3.7 at left forearm. -Due to her significant osteoporosis, AI stopped  -Continue Fosamax , calcium and vitamin D  supplement, and weightbearing exercise.          PLAN: -Labs reviewed -Screening R mammo due 06/2024, ordered today -Continue breast cancer surveillance -F/up in 1 year if still residing here  Orders Placed This Encounter  Procedures   MM 3D SCREENING MAMMOGRAM UNILATERAL RIGHT BREAST    Standing Status:   Future    Expected Date:   07/22/2024    Expiration Date:   04/02/2025    Reason for Exam (SYMPTOM  OR DIAGNOSIS REQUIRED):   h/o L breast cancer    [facility]erred [facility]      All questions were answered. The patient knows to call the clinic with any problems, questions or concerns. No barriers to learning were detected.   Kathleen Jolly K Braison Snoke, NP 04/02/2024

## 2024-04-02 ENCOUNTER — Inpatient Hospital Stay (HOSPITAL_BASED_OUTPATIENT_CLINIC_OR_DEPARTMENT_OTHER): Payer: Medicare HMO | Admitting: Nurse Practitioner

## 2024-04-02 ENCOUNTER — Encounter: Payer: Self-pay | Admitting: Nurse Practitioner

## 2024-04-02 ENCOUNTER — Inpatient Hospital Stay: Payer: Medicare HMO | Attending: Nurse Practitioner

## 2024-04-02 VITALS — BP 138/82 | HR 60 | Temp 97.6°F | Resp 17 | Wt 135.8 lb

## 2024-04-02 DIAGNOSIS — Z170 Estrogen receptor positive status [ER+]: Secondary | ICD-10-CM | POA: Insufficient documentation

## 2024-04-02 DIAGNOSIS — Z9012 Acquired absence of left breast and nipple: Secondary | ICD-10-CM | POA: Insufficient documentation

## 2024-04-02 DIAGNOSIS — C50412 Malignant neoplasm of upper-outer quadrant of left female breast: Secondary | ICD-10-CM | POA: Diagnosis present

## 2024-04-02 DIAGNOSIS — Z9221 Personal history of antineoplastic chemotherapy: Secondary | ICD-10-CM | POA: Insufficient documentation

## 2024-04-02 DIAGNOSIS — Z79811 Long term (current) use of aromatase inhibitors: Secondary | ICD-10-CM | POA: Diagnosis not present

## 2024-04-02 DIAGNOSIS — M8580 Other specified disorders of bone density and structure, unspecified site: Secondary | ICD-10-CM

## 2024-04-02 LAB — CBC WITH DIFFERENTIAL (CANCER CENTER ONLY)
Abs Immature Granulocytes: 0.01 10*3/uL (ref 0.00–0.07)
Basophils Absolute: 0 10*3/uL (ref 0.0–0.1)
Basophils Relative: 0 %
Eosinophils Absolute: 0.1 10*3/uL (ref 0.0–0.5)
Eosinophils Relative: 1 %
HCT: 40.5 % (ref 36.0–46.0)
Hemoglobin: 13.9 g/dL (ref 12.0–15.0)
Immature Granulocytes: 0 %
Lymphocytes Relative: 17 %
Lymphs Abs: 0.9 10*3/uL (ref 0.7–4.0)
MCH: 30.6 pg (ref 26.0–34.0)
MCHC: 34.3 g/dL (ref 30.0–36.0)
MCV: 89.2 fL (ref 80.0–100.0)
Monocytes Absolute: 0.4 10*3/uL (ref 0.1–1.0)
Monocytes Relative: 7 %
Neutro Abs: 3.8 10*3/uL (ref 1.7–7.7)
Neutrophils Relative %: 75 %
Platelet Count: 229 10*3/uL (ref 150–400)
RBC: 4.54 MIL/uL (ref 3.87–5.11)
RDW: 13.2 % (ref 11.5–15.5)
WBC Count: 5.2 10*3/uL (ref 4.0–10.5)
nRBC: 0 % (ref 0.0–0.2)

## 2024-04-02 LAB — CMP (CANCER CENTER ONLY)
ALT: 16 U/L (ref 0–44)
AST: 17 U/L (ref 15–41)
Albumin: 4.3 g/dL (ref 3.5–5.0)
Alkaline Phosphatase: 71 U/L (ref 38–126)
Anion gap: 6 (ref 5–15)
BUN: 14 mg/dL (ref 8–23)
CO2: 28 mmol/L (ref 22–32)
Calcium: 9.1 mg/dL (ref 8.9–10.3)
Chloride: 103 mmol/L (ref 98–111)
Creatinine: 0.45 mg/dL (ref 0.44–1.00)
GFR, Estimated: >60 mL/min (ref >60)
Glucose, Bld: 96 mg/dL (ref 70–99)
Potassium: 4.6 mmol/L (ref 3.5–5.1)
Sodium: 137 mmol/L (ref 135–145)
Total Bilirubin: 0.5 mg/dL (ref 0.0–1.2)
Total Protein: 6.8 g/dL (ref 6.5–8.1)

## 2024-04-02 LAB — VITAMIN D 25 HYDROXY (VIT D DEFICIENCY, FRACTURES): Vit D, 25-Hydroxy: 43.05 ng/mL (ref 30–100)

## 2024-04-03 ENCOUNTER — Ambulatory Visit: Payer: Self-pay | Admitting: Nurse Practitioner

## 2024-04-26 ENCOUNTER — Ambulatory Visit: Admitting: Podiatry

## 2024-07-23 ENCOUNTER — Ambulatory Visit
Admission: RE | Admit: 2024-07-23 | Discharge: 2024-07-23 | Disposition: A | Source: Ambulatory Visit | Attending: Nurse Practitioner

## 2024-07-23 DIAGNOSIS — C50412 Malignant neoplasm of upper-outer quadrant of left female breast: Secondary | ICD-10-CM

## 2024-08-14 ENCOUNTER — Ambulatory Visit: Admitting: Podiatry

## 2024-08-14 ENCOUNTER — Encounter: Payer: Self-pay | Admitting: Podiatry

## 2024-08-14 DIAGNOSIS — B351 Tinea unguium: Secondary | ICD-10-CM | POA: Diagnosis not present

## 2024-08-14 DIAGNOSIS — M79675 Pain in left toe(s): Secondary | ICD-10-CM

## 2024-08-14 DIAGNOSIS — M2042 Other hammer toe(s) (acquired), left foot: Secondary | ICD-10-CM | POA: Diagnosis not present

## 2024-08-14 DIAGNOSIS — M79674 Pain in right toe(s): Secondary | ICD-10-CM

## 2024-08-14 DIAGNOSIS — M2041 Other hammer toe(s) (acquired), right foot: Secondary | ICD-10-CM | POA: Diagnosis not present

## 2024-08-14 NOTE — Patient Instructions (Signed)
 Hammer Toe: What to Know Hammer toe is when your toe bends in a way that makes it look like a hammer. This usually happens to your second, third, or fourth toe. At first, you can straighten the toe, but over time it can get stiff and stay bent. A hammer toe can cause pain when you wear shoes. In some cases, the toe rubs against the shoe and can form bumps called corns or calluses. Early treatments to keep your toe straight may help with pain. As hammer toe gets worse and your toe gets stiff, you may need surgery to fix it. What are the causes? Hammer toe happens when the joint near your feet bends too much. Over time, the bending pulls on the muscles and the tissues that connect muscle to bone (tendons). This can make the muscles and tendons weak and stiff. Wearing shoes that are too narrow in the toe box and don't let your toes stay straight can cause a hammer toe. What increases the risk? You're more likely to get hammer toe if: You're an older female. You wear shoes that are too small or high heels that pinch your toes. Your second toe is longer than your big toe. You hurt your foot or toe. You have: Arthritis. A nerve or muscle problem. Diabetes. Charcot joint. This affects how you walk. Other people in your family have hammer toe. You're a Advertising account planner. What are the signs or symptoms?  Pain in your toe, such as when you walk or wear shoes. A thick patch of skin on the bent part of the toe or between your toes. This patch is called a corn or callus. Redness and a burning feeling on the bent toe. An open sore on top of the bent toe. Not being able to straighten the bent toe. How is this diagnosed? You may be diagnosed based on your symptoms and an exam. Your health care provider will check your toe and try to straighten it. You may also have some tests, such as: A blood test to check for arthritis or diabetes. An X-ray to show how bad the bending is. How is this treated? If you  can still straighten or move your toe, treatment may include: Taping the toe to keep it straight. Using pads and cushions to protect the bent toe. Wearing shoes that have enough room for the toes. Doing exercises to stretch your toe. Taking medicine to help with pain and swelling. Using special insoles to help with the pain and walking. If these treatments don't work or if the toe is very bent, you may need surgery. Common surgeries include: Arthroplasty or osteotomy. Part of the toe joint is remade or removed. This lets the toe straighten. Fusion. The bones are joined together into one long bone. Implantation. Part of the bone is removed. It's replaced with an implant to help your toe move. Flexor tendon transfer. The tendons that curl the toes down are removed. These are called your flexor tendons. Follow these instructions at home: Take your medicines only as told. Exercise as told. You may need to do exercises to straighten or stretch your toe. How is this prevented? Wear shoes that fit well and give your toes enough space. Shoes aren't supposed to hurt. Buy shoes at the end of the day when your feet might be a little bigger. As you age, your shoe size and width might change. Measure both feet. Buy shoes for the larger foot. A shoe repair store might be able  to stretch shoes that feel tight. Do not wear high heels or shoes with pointed toes. Contact a health care provider if: Your pain gets worse. Your toe gets red or swollen. You get an open sore on your toe. This information is not intended to replace advice given to you by your health care provider. Make sure you discuss any questions you have with your health care provider. Document Revised: 11/04/2023 Document Reviewed: 11/04/2023 Elsevier Patient Education  2025 [employer].

## 2024-08-14 NOTE — Progress Notes (Signed)
 Subjective: Chief Complaint  Patient presents with   RFC    Rm13 Routine foot care     88 year old female presents the office today for concerns of thick, elongated nails that she is not able to trim herself.   No open lesions.    States that she still has hammertoes and she tries to avoid shoes with excess pressure. She notes the right is worse than the left but concerned she may be developing them on the left as well.   Objective: AAO x3, NAD DP/PT pulses palpable bilaterally, CRT less than 3 seconds Nails are hypertrophic, dystrophic, brittle, discolored, elongated 10.  Yellow discoloration of the toenails no surrounding redness or drainage. Tenderness nails 1-5 bilaterally. No open lesions or pre-ulcerative lesions are identified today. Hammertoe present with the right >> left. On the left it is mostly the left 2nd.  No pain with calf compression, swelling, warmth, erythema  Assessment: Symptomatic onychomycosis; hammertoes   Plan: Symptomatic onychomycosis -Sharply debrided nails x 10 without any complications or bleeding  Hammertoes - We discussed with conservative as well as surgical options.  As she is not having significant pain we will continue with conservative management.  Discussed shoes to avoid pressure as well as offloading pads.  Kathleen Hanson DPM

## 2024-11-15 ENCOUNTER — Encounter: Payer: Self-pay | Admitting: Podiatry

## 2024-11-15 ENCOUNTER — Ambulatory Visit: Admitting: Podiatry

## 2024-11-15 DIAGNOSIS — M79674 Pain in right toe(s): Secondary | ICD-10-CM

## 2024-11-15 DIAGNOSIS — M79675 Pain in left toe(s): Secondary | ICD-10-CM

## 2024-11-15 DIAGNOSIS — M2041 Other hammer toe(s) (acquired), right foot: Secondary | ICD-10-CM | POA: Diagnosis not present

## 2024-11-15 DIAGNOSIS — B351 Tinea unguium: Secondary | ICD-10-CM | POA: Diagnosis not present

## 2024-11-15 DIAGNOSIS — M2042 Other hammer toe(s) (acquired), left foot: Secondary | ICD-10-CM

## 2024-11-16 NOTE — Progress Notes (Signed)
 Subjective: Chief Complaint  Patient presents with   Nail Problem    Patient is here for RFC     89 year old female presents the office today for concerns of thick, elongated nails that she is not able to trim herself.   No open lesions.    States that she still has hammertoes and she tries to avoid shoes with excess pressure. She brings up the hammertoes again today.  No recent injuries.  Objective: AAO x3, NAD DP/PT pulses palpable bilaterally, CRT less than 3 seconds Nails are hypertrophic, dystrophic, brittle, discolored, elongated 10.  Yellow discoloration of the toenails no surrounding redness or drainage. Tenderness nails 1-5 bilaterally. No open lesions or pre-ulcerative lesions are identified today. Hammertoe present with the right >> left. On the left it is mostly the left 2nd.  Subjectively she gets tenderness in the dorsal aspect from rubbing inside shoe gear mostly not on regular basis.  No significant pain on exam today. No pain with calf compression, swelling, warmth, erythema  Assessment: Symptomatic onychomycosis; hammertoes   Plan: Symptomatic onychomycosis -Sharply debrided nails x 10 without any complications or bleeding  Hammertoes - We discussed with conservative as well as surgical options.  Continue conservative management.  Discussed shoe modifications avoid pressure as well as offloading pads as well as exercises to initially help keep the toes from curling more.  Return in about 3 months (around 02/13/2025) for nail trim .  Donnice JONELLE Fees DPM

## 2025-02-14 ENCOUNTER — Ambulatory Visit: Admitting: Podiatry

## 2025-04-02 ENCOUNTER — Inpatient Hospital Stay

## 2025-04-02 ENCOUNTER — Inpatient Hospital Stay: Admitting: Hematology
